# Patient Record
Sex: Female | Born: 1960 | Race: White | Hispanic: No | Marital: Married | Smoking: Current some day smoker
Health system: Southern US, Community
[De-identification: ages and names within clinical notes are randomized; demographics above are authoritative.]

## PROBLEM LIST (undated history)

## (undated) DIAGNOSIS — E785 Hyperlipidemia, unspecified: Secondary | ICD-10-CM

## (undated) DIAGNOSIS — F32A Depression, unspecified: Secondary | ICD-10-CM

## (undated) DIAGNOSIS — IMO0001 Reserved for inherently not codable concepts without codable children: Secondary | ICD-10-CM

## (undated) DIAGNOSIS — C50911 Malignant neoplasm of unspecified site of right female breast: Secondary | ICD-10-CM

## (undated) DIAGNOSIS — M1990 Unspecified osteoarthritis, unspecified site: Secondary | ICD-10-CM

## (undated) DIAGNOSIS — Z8719 Personal history of other diseases of the digestive system: Secondary | ICD-10-CM

## (undated) DIAGNOSIS — Z9889 Other specified postprocedural states: Secondary | ICD-10-CM

## (undated) DIAGNOSIS — R413 Other amnesia: Secondary | ICD-10-CM

## (undated) DIAGNOSIS — I2510 Atherosclerotic heart disease of native coronary artery without angina pectoris: Secondary | ICD-10-CM

## (undated) DIAGNOSIS — F329 Major depressive disorder, single episode, unspecified: Secondary | ICD-10-CM

## (undated) DIAGNOSIS — I10 Essential (primary) hypertension: Secondary | ICD-10-CM

## (undated) DIAGNOSIS — I341 Nonrheumatic mitral (valve) prolapse: Secondary | ICD-10-CM

## (undated) DIAGNOSIS — C50919 Malignant neoplasm of unspecified site of unspecified female breast: Secondary | ICD-10-CM

## (undated) DIAGNOSIS — F419 Anxiety disorder, unspecified: Secondary | ICD-10-CM

## (undated) DIAGNOSIS — G40909 Epilepsy, unspecified, not intractable, without status epilepticus: Secondary | ICD-10-CM

## (undated) DIAGNOSIS — I499 Cardiac arrhythmia, unspecified: Secondary | ICD-10-CM

## (undated) DIAGNOSIS — T849XXA Unspecified complication of internal orthopedic prosthetic device, implant and graft, initial encounter: Secondary | ICD-10-CM

## (undated) DIAGNOSIS — M8580 Other specified disorders of bone density and structure, unspecified site: Secondary | ICD-10-CM

## (undated) DIAGNOSIS — R112 Nausea with vomiting, unspecified: Secondary | ICD-10-CM

## (undated) DIAGNOSIS — G40209 Localization-related (focal) (partial) symptomatic epilepsy and epileptic syndromes with complex partial seizures, not intractable, without status epilepticus: Secondary | ICD-10-CM

## (undated) DIAGNOSIS — R0602 Shortness of breath: Secondary | ICD-10-CM

## (undated) HISTORY — DX: Epilepsy, unspecified, not intractable, without status epilepticus: G40.909

## (undated) HISTORY — DX: Reserved for inherently not codable concepts without codable children: IMO0001

## (undated) HISTORY — PX: TONSILLECTOMY: SUR1361

## (undated) HISTORY — DX: Major depressive disorder, single episode, unspecified: F32.9

## (undated) HISTORY — PX: BACK SURGERY: SHX140

## (undated) HISTORY — DX: Unspecified complication of internal orthopedic prosthetic device, implant and graft, initial encounter: T84.9XXA

## (undated) HISTORY — DX: Unspecified osteoarthritis, unspecified site: M19.90

## (undated) HISTORY — DX: Shortness of breath: R06.02

## (undated) HISTORY — DX: Anxiety disorder, unspecified: F41.9

## (undated) HISTORY — DX: Malignant neoplasm of unspecified site of right female breast: C50.911

## (undated) HISTORY — DX: Nonrheumatic mitral (valve) prolapse: I34.1

## (undated) HISTORY — PX: SPINE SURGERY: SHX786

## (undated) HISTORY — DX: Other amnesia: R41.3

## (undated) HISTORY — DX: Localization-related (focal) (partial) symptomatic epilepsy and epileptic syndromes with complex partial seizures, not intractable, without status epilepticus: G40.209

## (undated) HISTORY — DX: Malignant neoplasm of unspecified site of unspecified female breast: C50.919

## (undated) HISTORY — PX: CARDIAC CATHETERIZATION: SHX172

## (undated) HISTORY — DX: Depression, unspecified: F32.A

---

## 1997-08-24 ENCOUNTER — Ambulatory Visit (HOSPITAL_COMMUNITY): Admission: RE | Admit: 1997-08-24 | Discharge: 1997-08-24 | Payer: Self-pay | Admitting: Neurosurgery

## 1997-08-29 ENCOUNTER — Ambulatory Visit (HOSPITAL_COMMUNITY): Admission: RE | Admit: 1997-08-29 | Discharge: 1997-08-29 | Payer: Self-pay | Admitting: Neurosurgery

## 1997-09-15 ENCOUNTER — Ambulatory Visit (HOSPITAL_COMMUNITY): Admission: RE | Admit: 1997-09-15 | Discharge: 1997-09-15 | Payer: Self-pay | Admitting: Neurosurgery

## 1997-09-29 ENCOUNTER — Ambulatory Visit (HOSPITAL_COMMUNITY): Admission: RE | Admit: 1997-09-29 | Discharge: 1997-09-29 | Payer: Self-pay | Admitting: Neurosurgery

## 1997-10-24 ENCOUNTER — Inpatient Hospital Stay (HOSPITAL_COMMUNITY): Admission: RE | Admit: 1997-10-24 | Discharge: 1997-10-26 | Payer: Self-pay | Admitting: Neurosurgery

## 1998-04-20 ENCOUNTER — Other Ambulatory Visit: Admission: RE | Admit: 1998-04-20 | Discharge: 1998-04-20 | Payer: Self-pay | Admitting: *Deleted

## 1999-06-16 ENCOUNTER — Other Ambulatory Visit: Admission: RE | Admit: 1999-06-16 | Discharge: 1999-06-16 | Payer: Self-pay | Admitting: *Deleted

## 1999-10-13 ENCOUNTER — Emergency Department (HOSPITAL_COMMUNITY): Admission: EM | Admit: 1999-10-13 | Discharge: 1999-10-13 | Payer: Self-pay | Admitting: Emergency Medicine

## 1999-10-13 ENCOUNTER — Encounter: Payer: Self-pay | Admitting: Emergency Medicine

## 1999-10-25 ENCOUNTER — Emergency Department (HOSPITAL_COMMUNITY): Admission: EM | Admit: 1999-10-25 | Discharge: 1999-10-25 | Payer: Self-pay | Admitting: Emergency Medicine

## 1999-10-25 ENCOUNTER — Encounter: Payer: Self-pay | Admitting: Emergency Medicine

## 2000-09-28 ENCOUNTER — Other Ambulatory Visit: Admission: RE | Admit: 2000-09-28 | Discharge: 2000-09-28 | Payer: Self-pay | Admitting: *Deleted

## 2000-12-21 ENCOUNTER — Other Ambulatory Visit: Admission: RE | Admit: 2000-12-21 | Discharge: 2000-12-21 | Payer: Self-pay | Admitting: *Deleted

## 2001-02-02 ENCOUNTER — Encounter (INDEPENDENT_AMBULATORY_CARE_PROVIDER_SITE_OTHER): Payer: Self-pay | Admitting: Specialist

## 2001-02-02 ENCOUNTER — Other Ambulatory Visit: Admission: RE | Admit: 2001-02-02 | Discharge: 2001-02-02 | Payer: Self-pay | Admitting: *Deleted

## 2001-05-31 ENCOUNTER — Other Ambulatory Visit: Admission: RE | Admit: 2001-05-31 | Discharge: 2001-05-31 | Payer: Self-pay | Admitting: *Deleted

## 2002-01-15 ENCOUNTER — Encounter: Payer: Self-pay | Admitting: Internal Medicine

## 2002-01-15 ENCOUNTER — Encounter: Admission: RE | Admit: 2002-01-15 | Discharge: 2002-01-15 | Payer: Self-pay | Admitting: Internal Medicine

## 2002-02-14 ENCOUNTER — Encounter: Payer: Self-pay | Admitting: Neurosurgery

## 2002-02-15 ENCOUNTER — Encounter: Payer: Self-pay | Admitting: Neurosurgery

## 2002-02-15 ENCOUNTER — Observation Stay (HOSPITAL_COMMUNITY): Admission: RE | Admit: 2002-02-15 | Discharge: 2002-02-16 | Payer: Self-pay | Admitting: Neurosurgery

## 2002-07-02 ENCOUNTER — Other Ambulatory Visit: Admission: RE | Admit: 2002-07-02 | Discharge: 2002-07-02 | Payer: Self-pay | Admitting: Obstetrics & Gynecology

## 2002-09-05 ENCOUNTER — Ambulatory Visit (HOSPITAL_BASED_OUTPATIENT_CLINIC_OR_DEPARTMENT_OTHER): Admission: RE | Admit: 2002-09-05 | Discharge: 2002-09-05 | Payer: Self-pay | Admitting: Urology

## 2002-09-05 ENCOUNTER — Encounter (INDEPENDENT_AMBULATORY_CARE_PROVIDER_SITE_OTHER): Payer: Self-pay | Admitting: Specialist

## 2003-04-03 ENCOUNTER — Ambulatory Visit (HOSPITAL_COMMUNITY): Admission: RE | Admit: 2003-04-03 | Discharge: 2003-04-03 | Payer: Self-pay | Admitting: Neurology

## 2003-07-16 ENCOUNTER — Other Ambulatory Visit: Admission: RE | Admit: 2003-07-16 | Discharge: 2003-07-16 | Payer: Self-pay | Admitting: Obstetrics & Gynecology

## 2004-01-28 ENCOUNTER — Ambulatory Visit (HOSPITAL_COMMUNITY): Admission: RE | Admit: 2004-01-28 | Discharge: 2004-01-28 | Payer: Self-pay | Admitting: Neurosurgery

## 2004-03-24 ENCOUNTER — Inpatient Hospital Stay (HOSPITAL_COMMUNITY): Admission: RE | Admit: 2004-03-24 | Discharge: 2004-03-28 | Payer: Self-pay | Admitting: Neurosurgery

## 2008-04-18 HISTORY — PX: BREAST SURGERY: SHX581

## 2008-06-20 ENCOUNTER — Ambulatory Visit (HOSPITAL_COMMUNITY): Admission: RE | Admit: 2008-06-20 | Discharge: 2008-06-20 | Payer: Self-pay | Admitting: Neurosurgery

## 2009-01-26 ENCOUNTER — Encounter: Admission: RE | Admit: 2009-01-26 | Discharge: 2009-01-26 | Payer: Self-pay | Admitting: General Surgery

## 2009-02-13 ENCOUNTER — Ambulatory Visit (HOSPITAL_COMMUNITY): Admission: RE | Admit: 2009-02-13 | Discharge: 2009-02-14 | Payer: Self-pay | Admitting: General Surgery

## 2009-02-13 ENCOUNTER — Encounter (INDEPENDENT_AMBULATORY_CARE_PROVIDER_SITE_OTHER): Payer: Self-pay | Admitting: General Surgery

## 2009-02-13 DIAGNOSIS — C50919 Malignant neoplasm of unspecified site of unspecified female breast: Secondary | ICD-10-CM

## 2009-02-13 HISTORY — PX: MASTECTOMY, RADICAL: SHX710

## 2009-02-13 HISTORY — DX: Malignant neoplasm of unspecified site of unspecified female breast: C50.919

## 2009-02-24 ENCOUNTER — Ambulatory Visit: Payer: Self-pay | Admitting: Oncology

## 2009-03-10 ENCOUNTER — Ambulatory Visit: Payer: Self-pay | Admitting: Genetic Counselor

## 2009-03-24 LAB — COMPREHENSIVE METABOLIC PANEL
ALT: 12 U/L (ref 0–35)
AST: 12 U/L (ref 0–37)
Albumin: 4.5 g/dL (ref 3.5–5.2)
Alkaline Phosphatase: 79 U/L (ref 39–117)
BUN: 11 mg/dL (ref 6–23)
CO2: 26 mEq/L (ref 19–32)
Calcium: 9.2 mg/dL (ref 8.4–10.5)
Chloride: 104 mEq/L (ref 96–112)
Creatinine, Ser: 0.86 mg/dL (ref 0.40–1.20)
Glucose, Bld: 95 mg/dL (ref 70–99)
Potassium: 4 mEq/L (ref 3.5–5.3)
Sodium: 139 mEq/L (ref 135–145)
Total Bilirubin: 0.4 mg/dL (ref 0.3–1.2)
Total Protein: 6.7 g/dL (ref 6.0–8.3)

## 2009-03-24 LAB — CBC WITH DIFFERENTIAL/PLATELET
BASO%: 0.6 % (ref 0.0–2.0)
Basophils Absolute: 0.1 10*3/uL (ref 0.0–0.1)
EOS%: 2.5 % (ref 0.0–7.0)
Eosinophils Absolute: 0.2 10*3/uL (ref 0.0–0.5)
HCT: 46.5 % (ref 34.8–46.6)
HGB: 16.1 g/dL — ABNORMAL HIGH (ref 11.6–15.9)
LYMPH%: 43.1 % (ref 14.0–49.7)
MCH: 32.2 pg (ref 25.1–34.0)
MCHC: 34.5 g/dL (ref 31.5–36.0)
MCV: 93.3 fL (ref 79.5–101.0)
MONO#: 0.7 10*3/uL (ref 0.1–0.9)
MONO%: 7.6 % (ref 0.0–14.0)
NEUT#: 4.3 10*3/uL (ref 1.5–6.5)
NEUT%: 46.2 % (ref 38.4–76.8)
Platelets: 184 10*3/uL (ref 145–400)
RBC: 4.99 10*6/uL (ref 3.70–5.45)
RDW: 12.8 % (ref 11.2–14.5)
WBC: 9.2 10*3/uL (ref 3.9–10.3)
lymph#: 4 10*3/uL — ABNORMAL HIGH (ref 0.9–3.3)

## 2009-03-27 ENCOUNTER — Ambulatory Visit: Payer: Self-pay | Admitting: Oncology

## 2009-04-01 LAB — CBC WITH DIFFERENTIAL/PLATELET
BASO%: 0.7 % (ref 0.0–2.0)
Basophils Absolute: 0.1 10*3/uL (ref 0.0–0.1)
EOS%: 2.3 % (ref 0.0–7.0)
Eosinophils Absolute: 0.2 10*3/uL (ref 0.0–0.5)
HCT: 43.8 % (ref 34.8–46.6)
HGB: 15.6 g/dL (ref 11.6–15.9)
LYMPH%: 41.7 % (ref 14.0–49.7)
MCH: 31.9 pg (ref 25.1–34.0)
MCHC: 35.6 g/dL (ref 31.5–36.0)
MCV: 89.6 fL (ref 79.5–101.0)
MONO#: 0.8 10*3/uL (ref 0.1–0.9)
MONO%: 9.7 % (ref 0.0–14.0)
NEUT#: 3.7 10*3/uL (ref 1.5–6.5)
NEUT%: 45.6 % (ref 38.4–76.8)
Platelets: 189 10*3/uL (ref 145–400)
RBC: 4.89 10*6/uL (ref 3.70–5.45)
RDW: 12.9 % (ref 11.2–14.5)
WBC: 8.1 10*3/uL (ref 3.9–10.3)
lymph#: 3.4 10*3/uL — ABNORMAL HIGH (ref 0.9–3.3)
nRBC: 0 % (ref 0–0)

## 2009-04-16 ENCOUNTER — Ambulatory Visit: Payer: Self-pay | Admitting: Genetic Counselor

## 2009-04-22 LAB — COMPREHENSIVE METABOLIC PANEL
ALT: 17 U/L (ref 0–35)
AST: 13 U/L (ref 0–37)
Albumin: 4.3 g/dL (ref 3.5–5.2)
Alkaline Phosphatase: 86 U/L (ref 39–117)
BUN: 10 mg/dL (ref 6–23)
CO2: 21 mEq/L (ref 19–32)
Calcium: 9 mg/dL (ref 8.4–10.5)
Chloride: 106 mEq/L (ref 96–112)
Creatinine, Ser: 0.63 mg/dL (ref 0.40–1.20)
Glucose, Bld: 91 mg/dL (ref 70–99)
Potassium: 4.5 mEq/L (ref 3.5–5.3)
Sodium: 139 mEq/L (ref 135–145)
Total Bilirubin: 0.5 mg/dL (ref 0.3–1.2)
Total Protein: 6.5 g/dL (ref 6.0–8.3)

## 2009-04-22 LAB — CBC WITH DIFFERENTIAL/PLATELET
BASO%: 1 % (ref 0.0–2.0)
Basophils Absolute: 0.1 10*3/uL (ref 0.0–0.1)
EOS%: 0.2 % (ref 0.0–7.0)
Eosinophils Absolute: 0 10*3/uL (ref 0.0–0.5)
HCT: 45.2 % (ref 34.8–46.6)
HGB: 15.8 g/dL (ref 11.6–15.9)
LYMPH%: 24.9 % (ref 14.0–49.7)
MCH: 32.5 pg (ref 25.1–34.0)
MCHC: 35 g/dL (ref 31.5–36.0)
MCV: 92.9 fL (ref 79.5–101.0)
MONO#: 1.2 10*3/uL — ABNORMAL HIGH (ref 0.1–0.9)
MONO%: 15.3 % — ABNORMAL HIGH (ref 0.0–14.0)
NEUT#: 4.4 10*3/uL (ref 1.5–6.5)
NEUT%: 58.6 % (ref 38.4–76.8)
Platelets: 351 10*3/uL (ref 145–400)
RBC: 4.87 10*6/uL (ref 3.70–5.45)
RDW: 13.8 % (ref 11.2–14.5)
WBC: 7.5 10*3/uL (ref 3.9–10.3)
lymph#: 1.9 10*3/uL (ref 0.9–3.3)

## 2009-05-12 ENCOUNTER — Ambulatory Visit: Payer: Self-pay | Admitting: Genetic Counselor

## 2009-05-14 LAB — COMPREHENSIVE METABOLIC PANEL
ALT: 17 U/L (ref 0–35)
AST: 14 U/L (ref 0–37)
Albumin: 4 g/dL (ref 3.5–5.2)
Alkaline Phosphatase: 90 U/L (ref 39–117)
BUN: 11 mg/dL (ref 6–23)
CO2: 24 mEq/L (ref 19–32)
Calcium: 8.9 mg/dL (ref 8.4–10.5)
Chloride: 108 mEq/L (ref 96–112)
Creatinine, Ser: 0.7 mg/dL (ref 0.40–1.20)
Glucose, Bld: 93 mg/dL (ref 70–99)
Potassium: 4.1 mEq/L (ref 3.5–5.3)
Sodium: 142 mEq/L (ref 135–145)
Total Bilirubin: 0.3 mg/dL (ref 0.3–1.2)
Total Protein: 6.3 g/dL (ref 6.0–8.3)

## 2009-05-14 LAB — CBC WITH DIFFERENTIAL/PLATELET
BASO%: 0.9 % (ref 0.0–2.0)
Basophils Absolute: 0.1 10*3/uL (ref 0.0–0.1)
EOS%: 0.4 % (ref 0.0–7.0)
Eosinophils Absolute: 0 10*3/uL (ref 0.0–0.5)
HCT: 42.3 % (ref 34.8–46.6)
HGB: 14.7 g/dL (ref 11.6–15.9)
LYMPH%: 18.7 % (ref 14.0–49.7)
MCH: 31.8 pg (ref 25.1–34.0)
MCHC: 34.8 g/dL (ref 31.5–36.0)
MCV: 91.6 fL (ref 79.5–101.0)
MONO#: 1.1 10*3/uL — ABNORMAL HIGH (ref 0.1–0.9)
MONO%: 13.1 % (ref 0.0–14.0)
NEUT#: 5.7 10*3/uL (ref 1.5–6.5)
NEUT%: 66.9 % (ref 38.4–76.8)
Platelets: 307 10*3/uL (ref 145–400)
RBC: 4.62 10*6/uL (ref 3.70–5.45)
RDW: 15.3 % — ABNORMAL HIGH (ref 11.2–14.5)
WBC: 8.5 10*3/uL (ref 3.9–10.3)
lymph#: 1.6 10*3/uL (ref 0.9–3.3)

## 2009-06-04 LAB — CBC WITH DIFFERENTIAL/PLATELET
BASO%: 1.2 % (ref 0.0–2.0)
Basophils Absolute: 0.1 10*3/uL (ref 0.0–0.1)
EOS%: 0.3 % (ref 0.0–7.0)
Eosinophils Absolute: 0 10*3/uL (ref 0.0–0.5)
HCT: 41.9 % (ref 34.8–46.6)
HGB: 14.6 g/dL (ref 11.6–15.9)
LYMPH%: 16.2 % (ref 14.0–49.7)
MCH: 32.3 pg (ref 25.1–34.0)
MCHC: 34.8 g/dL (ref 31.5–36.0)
MCV: 92.7 fL (ref 79.5–101.0)
MONO#: 1 10*3/uL — ABNORMAL HIGH (ref 0.1–0.9)
MONO%: 14.9 % — ABNORMAL HIGH (ref 0.0–14.0)
NEUT#: 4.5 10*3/uL (ref 1.5–6.5)
NEUT%: 67.4 % (ref 38.4–76.8)
Platelets: 216 10*3/uL (ref 145–400)
RBC: 4.52 10*6/uL (ref 3.70–5.45)
RDW: 15.5 % — ABNORMAL HIGH (ref 11.2–14.5)
WBC: 6.7 10*3/uL (ref 3.9–10.3)
lymph#: 1.1 10*3/uL (ref 0.9–3.3)

## 2009-06-29 ENCOUNTER — Ambulatory Visit: Payer: Self-pay | Admitting: Genetic Counselor

## 2009-06-30 ENCOUNTER — Ambulatory Visit (HOSPITAL_COMMUNITY): Admission: RE | Admit: 2009-06-30 | Discharge: 2009-06-30 | Payer: Self-pay | Admitting: Oncology

## 2009-07-08 ENCOUNTER — Ambulatory Visit: Payer: Self-pay | Admitting: Oncology

## 2009-07-10 LAB — COMPREHENSIVE METABOLIC PANEL
ALT: 14 U/L (ref 0–35)
AST: 12 U/L (ref 0–37)
Albumin: 4.4 g/dL (ref 3.5–5.2)
Alkaline Phosphatase: 94 U/L (ref 39–117)
BUN: 9 mg/dL (ref 6–23)
CO2: 21 mEq/L (ref 19–32)
Calcium: 9.2 mg/dL (ref 8.4–10.5)
Chloride: 106 mEq/L (ref 96–112)
Creatinine, Ser: 0.79 mg/dL (ref 0.40–1.20)
Glucose, Bld: 92 mg/dL (ref 70–99)
Potassium: 4 mEq/L (ref 3.5–5.3)
Sodium: 142 mEq/L (ref 135–145)
Total Bilirubin: 0.3 mg/dL (ref 0.3–1.2)
Total Protein: 6.7 g/dL (ref 6.0–8.3)

## 2009-07-10 LAB — CBC WITH DIFFERENTIAL/PLATELET
BASO%: 0.5 % (ref 0.0–2.0)
Basophils Absolute: 0 10*3/uL (ref 0.0–0.1)
EOS%: 3.7 % (ref 0.0–7.0)
Eosinophils Absolute: 0.2 10*3/uL (ref 0.0–0.5)
HCT: 46.3 % (ref 34.8–46.6)
HGB: 16 g/dL — ABNORMAL HIGH (ref 11.6–15.9)
LYMPH%: 23.9 % (ref 14.0–49.7)
MCH: 33.2 pg (ref 25.1–34.0)
MCHC: 34.6 g/dL (ref 31.5–36.0)
MCV: 95.9 fL (ref 79.5–101.0)
MONO#: 0.6 10*3/uL (ref 0.1–0.9)
MONO%: 8.6 % (ref 0.0–14.0)
NEUT#: 4.1 10*3/uL (ref 1.5–6.5)
NEUT%: 63.3 % (ref 38.4–76.8)
Platelets: 194 10*3/uL (ref 145–400)
RBC: 4.83 10*6/uL (ref 3.70–5.45)
RDW: 12.7 % (ref 11.2–14.5)
WBC: 6.6 10*3/uL (ref 3.9–10.3)
lymph#: 1.6 10*3/uL (ref 0.9–3.3)

## 2009-07-10 LAB — FOLLICLE STIMULATING HORMONE: FSH: 112.2 m[IU]/mL

## 2009-07-10 LAB — LUTEINIZING HORMONE: LH: 51.2 m[IU]/mL

## 2009-08-06 LAB — LUTEINIZING HORMONE: LH: 49.8 m[IU]/mL

## 2009-08-06 LAB — FOLLICLE STIMULATING HORMONE: FSH: 122.4 m[IU]/mL — ABNORMAL HIGH

## 2009-09-08 ENCOUNTER — Ambulatory Visit: Payer: Self-pay | Admitting: Oncology

## 2009-09-17 LAB — CBC WITH DIFFERENTIAL/PLATELET
BASO%: 0.9 % (ref 0.0–2.0)
Basophils Absolute: 0.1 10*3/uL (ref 0.0–0.1)
EOS%: 3.4 % (ref 0.0–7.0)
Eosinophils Absolute: 0.2 10*3/uL (ref 0.0–0.5)
HCT: 46.8 % — ABNORMAL HIGH (ref 34.8–46.6)
HGB: 16.2 g/dL — ABNORMAL HIGH (ref 11.6–15.9)
LYMPH%: 32 % (ref 14.0–49.7)
MCH: 31.8 pg (ref 25.1–34.0)
MCHC: 34.7 g/dL (ref 31.5–36.0)
MCV: 91.7 fL (ref 79.5–101.0)
MONO#: 0.6 10*3/uL (ref 0.1–0.9)
MONO%: 9.7 % (ref 0.0–14.0)
NEUT#: 3.4 10*3/uL (ref 1.5–6.5)
NEUT%: 54 % (ref 38.4–76.8)
Platelets: 198 10*3/uL (ref 145–400)
RBC: 5.11 10*6/uL (ref 3.70–5.45)
RDW: 13.4 % (ref 11.2–14.5)
WBC: 6.3 10*3/uL (ref 3.9–10.3)
lymph#: 2 10*3/uL (ref 0.9–3.3)

## 2009-09-17 LAB — COMPREHENSIVE METABOLIC PANEL
ALT: 15 U/L (ref 0–35)
AST: 15 U/L (ref 0–37)
Albumin: 4.3 g/dL (ref 3.5–5.2)
Alkaline Phosphatase: 86 U/L (ref 39–117)
BUN: 11 mg/dL (ref 6–23)
CO2: 19 mEq/L (ref 19–32)
Calcium: 9.2 mg/dL (ref 8.4–10.5)
Chloride: 108 mEq/L (ref 96–112)
Creatinine, Ser: 0.82 mg/dL (ref 0.40–1.20)
Glucose, Bld: 100 mg/dL — ABNORMAL HIGH (ref 70–99)
Potassium: 4 mEq/L (ref 3.5–5.3)
Sodium: 142 mEq/L (ref 135–145)
Total Bilirubin: 0.5 mg/dL (ref 0.3–1.2)
Total Protein: 6.6 g/dL (ref 6.0–8.3)

## 2009-09-17 LAB — LUTEINIZING HORMONE: LH: 47.8 m[IU]/mL

## 2009-09-17 LAB — FOLLICLE STIMULATING HORMONE: FSH: 128.4 m[IU]/mL — ABNORMAL HIGH

## 2009-12-17 ENCOUNTER — Ambulatory Visit: Payer: Self-pay | Admitting: Oncology

## 2009-12-28 LAB — COMPREHENSIVE METABOLIC PANEL
ALT: 11 U/L (ref 0–35)
AST: 14 U/L (ref 0–37)
Albumin: 4.4 g/dL (ref 3.5–5.2)
Alkaline Phosphatase: 106 U/L (ref 39–117)
BUN: 7 mg/dL (ref 6–23)
CO2: 25 mEq/L (ref 19–32)
Calcium: 9.3 mg/dL (ref 8.4–10.5)
Chloride: 106 mEq/L (ref 96–112)
Creatinine, Ser: 0.81 mg/dL (ref 0.40–1.20)
Glucose, Bld: 103 mg/dL — ABNORMAL HIGH (ref 70–99)
Potassium: 4.4 mEq/L (ref 3.5–5.3)
Sodium: 142 mEq/L (ref 135–145)
Total Bilirubin: 0.4 mg/dL (ref 0.3–1.2)
Total Protein: 6.6 g/dL (ref 6.0–8.3)

## 2009-12-28 LAB — CBC WITH DIFFERENTIAL/PLATELET
BASO%: 1.6 % (ref 0.0–2.0)
Basophils Absolute: 0.1 10*3/uL (ref 0.0–0.1)
EOS%: 2.9 % (ref 0.0–7.0)
Eosinophils Absolute: 0.2 10*3/uL (ref 0.0–0.5)
HCT: 47.1 % — ABNORMAL HIGH (ref 34.8–46.6)
HGB: 16.4 g/dL — ABNORMAL HIGH (ref 11.6–15.9)
LYMPH%: 32.5 % (ref 14.0–49.7)
MCH: 32.3 pg (ref 25.1–34.0)
MCHC: 34.8 g/dL (ref 31.5–36.0)
MCV: 92.8 fL (ref 79.5–101.0)
MONO#: 0.6 10*3/uL (ref 0.1–0.9)
MONO%: 9.7 % (ref 0.0–14.0)
NEUT#: 3.3 10*3/uL (ref 1.5–6.5)
NEUT%: 53.3 % (ref 38.4–76.8)
Platelets: 170 10*3/uL (ref 145–400)
RBC: 5.07 10*6/uL (ref 3.70–5.45)
RDW: 12.8 % (ref 11.2–14.5)
WBC: 6.2 10*3/uL (ref 3.9–10.3)
lymph#: 2 10*3/uL (ref 0.9–3.3)

## 2009-12-28 LAB — LUTEINIZING HORMONE: LH: 56.5 m[IU]/mL

## 2009-12-28 LAB — FOLLICLE STIMULATING HORMONE: FSH: 150.5 m[IU]/mL — ABNORMAL HIGH

## 2010-01-06 LAB — ESTRADIOL, ULTRA SENS: Estradiol, Ultra Sensitive: 7 pg/mL

## 2010-03-29 ENCOUNTER — Ambulatory Visit: Payer: Self-pay | Admitting: Oncology

## 2010-04-18 HISTORY — PX: HAND TENDON SURGERY: SHX663

## 2010-06-01 DIAGNOSIS — I341 Nonrheumatic mitral (valve) prolapse: Secondary | ICD-10-CM

## 2010-06-01 HISTORY — DX: Nonrheumatic mitral (valve) prolapse: I34.1

## 2010-06-29 ENCOUNTER — Other Ambulatory Visit: Payer: Self-pay | Admitting: Rheumatology

## 2010-06-29 DIAGNOSIS — M79644 Pain in right finger(s): Secondary | ICD-10-CM

## 2010-07-01 ENCOUNTER — Ambulatory Visit
Admission: RE | Admit: 2010-07-01 | Discharge: 2010-07-01 | Disposition: A | Discharge status: Home / Self Care | Payer: Medicare Other | Source: Ambulatory Visit | Attending: Rheumatology | Admitting: Rheumatology

## 2010-07-01 DIAGNOSIS — M79644 Pain in right finger(s): Secondary | ICD-10-CM

## 2010-07-01 MED ORDER — GADOBENATE DIMEGLUMINE 529 MG/ML IV SOLN
12.0000 mL | Freq: Once | INTRAVENOUS | Status: AC | PRN
  Administered 2010-07-01: 12 mL via INTRAVENOUS

## 2010-07-22 LAB — DIFFERENTIAL
Basophils Absolute: 0.1 10*3/uL (ref 0.0–0.1)
Basophils Relative: 1 % (ref 0–1)
Eosinophils Absolute: 0.2 10*3/uL (ref 0.0–0.7)
Eosinophils Relative: 2 % (ref 0–5)
Lymphocytes Relative: 45 % (ref 12–46)
Lymphs Abs: 4 10*3/uL (ref 0.7–4.0)
Monocytes Absolute: 0.8 10*3/uL (ref 0.1–1.0)
Monocytes Relative: 9 % (ref 3–12)
Neutro Abs: 3.9 10*3/uL (ref 1.7–7.7)
Neutrophils Relative %: 44 % (ref 43–77)

## 2010-07-22 LAB — COMPREHENSIVE METABOLIC PANEL
ALT: 20 U/L (ref 0–35)
AST: 19 U/L (ref 0–37)
Albumin: 4.2 g/dL (ref 3.5–5.2)
Alkaline Phosphatase: 82 U/L (ref 39–117)
BUN: 6 mg/dL (ref 6–23)
CO2: 26 mEq/L (ref 19–32)
Calcium: 8.9 mg/dL (ref 8.4–10.5)
Chloride: 106 mEq/L (ref 96–112)
Creatinine, Ser: 0.81 mg/dL (ref 0.4–1.2)
GFR calc Af Amer: >60 mL/min (ref >60)
GFR calc non Af Amer: >60 mL/min (ref >60)
Glucose, Bld: 88 mg/dL (ref 70–99)
Potassium: 3.7 mEq/L (ref 3.5–5.1)
Sodium: 140 mEq/L (ref 135–145)
Total Bilirubin: 0.4 mg/dL (ref 0.3–1.2)
Total Protein: 7.1 g/dL (ref 6.0–8.3)

## 2010-07-22 LAB — URINALYSIS, ROUTINE W REFLEX MICROSCOPIC
Bilirubin Urine: NEGATIVE
Glucose, UA: NEGATIVE mg/dL
Hgb urine dipstick: NEGATIVE
Ketones, ur: NEGATIVE mg/dL
Nitrite: NEGATIVE
Protein, ur: NEGATIVE mg/dL
Specific Gravity, Urine: 1.01 (ref 1.005–1.030)
Urobilinogen, UA: 0.2 mg/dL (ref 0.0–1.0)
pH: 6 (ref 5.0–8.0)

## 2010-07-22 LAB — CBC
HCT: 49.2 % — ABNORMAL HIGH (ref 36.0–46.0)
Hemoglobin: 17 g/dL — ABNORMAL HIGH (ref 12.0–15.0)
MCHC: 34.6 g/dL (ref 30.0–36.0)
MCV: 94.4 fL (ref 78.0–100.0)
Platelets: 199 10*3/uL (ref 150–400)
RBC: 5.21 MIL/uL — ABNORMAL HIGH (ref 3.87–5.11)
RDW: 13.2 % (ref 11.5–15.5)
WBC: 9 10*3/uL (ref 4.0–10.5)

## 2010-07-22 LAB — LACTATE DEHYDROGENASE: LDH: 124 U/L (ref 94–250)

## 2010-07-22 LAB — CANCER ANTIGEN 27.29: CA 27.29: 37 U/mL (ref 0–39)

## 2010-09-03 NOTE — Op Note (Signed)
NAMEAYRIANNA, MCGINNISS             ACCOUNT NO.:  [id number]   MEDICAL RECORD NO.:  [id number]          PATIENT TYPE:  INP   LOCATION:  3113                         FACILITY:  [facility]   PHYSICIAN:  Payton Doughty, M.D.      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   PROCEDURE:  L2-3, L3-4 laminectomy and discectomy.  Posterior lumbar  interbody fusion with Ray type fusion cages.  Posterolateral arthrodesis.   SURGEON:  Payton Doughty, M.D.   ANESTHESIA:  General endotracheal anesthesia.   PREPARATION:  __________ prepped and scrubbed with alcohol wipe.   COMPLICATIONS:  None.   NURSING ASSISTANT:  [place].   DOCTOR ASSISTANT:  Stefani Dama, M.D.   DOCTOR ASSISTANT:  A 50 year old right-handed white female with severe  lumbar spondylosis taken to the operating room __________ intubated, placed  prone on the operating table.  Following shave, prep and drape in he usual  sterile fashion, skin was infiltrated with 1% lidocaine with 1:400,000  epinephrine and the skin was incised from the middle of L1 to the bottom of  L3 and the lamina and transverse processes of L2, L3 and L4 were exposed  bilaterally in subperiosteal plane.  Intraoperative x-ray confirmed  correctness level of the pars, lenticularis, lamina and inferior facet of L2  and L3 and the superior facet of L3 and L4 were removed bilaterally.  On the  left side, at 2-3, facet was actually disrupted and fractured with some  penetration of the dura. The fragments were removed and the dura repaired  without difficulty.  Brisk bleeding was encountered at all levels from the  epidural space.  After using cautery and thrombin soaked Gelfoam for  hemostasis, the nerve roots were dissected free and the disc removed at each  level.  A 12 x 20  mm Ray type fusion cages were then placed bilaterally at  each level.  Intraoperative x-ray showed good placement of cages.  They were  packed with bone graft harvested from the facet joints.  DBX and morselized  allograft were then mixed over the transverse processes which had been  decorticated as posterolateral arthrodesis.  The entire repair was irrigated  and hemostasis assured.  The fascia was reapproximated  with 0 Vicryl in interrupted fashion.  Subcutaneous tissue was  reapproximated with 0 Vicryl in interrupted fashion.  Skin was closed with 3-  0 nylon in running locked fashion.  Betadine and Telfa dressing was applied  and made occlusive with Op-Site and patient returned to the recovery room in  good condition.       MWR/MEDQ  D:  03/24/2004  T:  03/25/2004  Job:  [id number]

## 2010-09-03 NOTE — Consult Note (Signed)
[name], [name]             ACCOUNT NO.:  [id number]   MEDICAL RECORD NO.:  [id number]          PATIENT TYPE:  INP   LOCATION:  3113                         FACILITY:  [facility]   PHYSICIAN:  Ulyses Amor, MD DATE OF BIRTH:  01/02/1961   DATE OF CONSULTATION:  03/24/2004  DATE OF DISCHARGE:                                   CONSULTATION   CONSULTING PHYSICIAN:  Ulyses Amor, M.D.   Wendy Lopez is a 50 year old white woman who underwent back surgery.  Postoperatively, she complained of vague chest pain.  On further  questioning, she describes it as a fullness in her anterior chest.  It did  not radiate.  There were no other associated symptoms.  It appeared not to  be related to position __________  respirations.  An electrocardiogram was  obtained which demonstrated anterior T wave inversion, hence, cardiology  consultation was requested urgently.  She is hospitalized in the neuro  intensive care unit.  Her surgery and her postoperative course have  otherwise been unremarkable.   The patient has no history of cardiac disease, including no history of chest  pain, myocardial infarction, coronary artery disease, congestive heart  failure, or arrhythmia.  She has complained of palpitations in the past  which have been treated with a beta-blocker but no specific arrhythmia or  conduction disturbance has been documented.  In December 2004, she underwent  a stress Cardiolite.  This demonstrated no evidence of ischemia.  Her  ejection fraction was 83%.  The patient has no history of hypertension,  hyperlipidemia, or diabetes mellitus.  She smokes one pack of cigarettes per  day.  There is also a family history of coronary artery disease (father).   ALLERGIES:  The patient is reportedly allergic to a host of medications.  These include:  1.  IODINE.  2.  PREDNISONE.  3.  CARBAMAZEPINE.  4.  TYLENOL.  5.  VICODIN.  6.  IBUPROFEN.   PAST MEDICAL HISTORY:  Seizure  disorder.   CURRENT MEDICATIONS:  Dilantin, Lamictal, Toprol XL, Depo-Provera, Tramadol,  Skelaxin, and Actonel.   The patient lives with her husband.   PHYSICAL EXAMINATION:  VITAL SIGNS:  Blood pressure 119/42, pulse 75 and  regular, respirations 12.  GENERAL:  The patient was a middle-aged white woman who was somewhat  lethargic post anesthesia.  She responded to questions appropriately.  HEENT:  Normal.  NECK:  Without thyromegaly or adenopathy.  Carotid pulses were palpable  bilaterally and without bruits.  CARDIAC:  Revealed a normal S1 and S2.  There was no S3, S4, murmur, rub, or  click.  Cardiac rhythm was regular.  No chest wall tenderness was noted.  LUNGS:  Clear.  ABDOMEN:  Soft and nontender.  There was no mass, hepatosplenomegaly, bruit,  distention, rebound, guarding, or rigidity.  EXTREMITIES:  Without edema, deviation, or deformity.  Radial and dorsalis  pedal pulses were palpable bilaterally.   The electrocardiogram, dated December __________ at 12:16, revealed a normal  sinus rhythm with T wave inversion in the inferior leads and T wave  flattening in the anterolateral leads.  The second tracing, dated March 24, 2004 at 1328, revealed the aforementioned findings plus T wave inversion  in lead III.  The third tracing, date March 24, 2004 at 2011, revealed  more marked anterior T wave inversion this time involving the V1-V4.   White count (preadmission) was 9.1 with a hemoglobin of 16.2, and hematocrit  of 46.0.  Potassium was 4.4 with a BUN of 6, and creatinine of 0.9.   IMPRESSION:  1.  Vague chest discomfort - postoperatively, anterior T wave inversions,      rule out cardiac ischemia.  There was no prior history of coronary      artery disease and a stress Cardiolite last December was negative for      ischemia.  2.  History of palpitations.   RECOMMENDATIONS:  1.  Serial cardiac enzymes.  2.  Repeat electrocardiogram.  3.  Dr. Tresa Endo will follow  with you.  4.  Discontinue smoking.   The patient's husband was present in the room throughout the patient  encounter.      Mitc   MSC/MEDQ  D:  03/24/2004  T:  03/24/2004  Job:  [id number]   cc:   Ulyses Amor, MD  [address]  [place], [place] [address]  Fax: [phone]   Payton Doughty, M.D.  [address]  [place] [address]  Fax: [phone]   Nicki Guadalajara, M.D.  [address], [address]  [place], [place] [address]  Fax: [phone]

## 2010-09-03 NOTE — H&P (Signed)
Wendy Lopez, Wendy Lopez             ACCOUNT NO.:  [id number]   MEDICAL RECORD NO.:  [id number]          PATIENT TYPE:  INP   LOCATION:  2861                         FACILITY:  [facility]   PHYSICIAN:  Payton Doughty, M.D.      DATE OF BIRTH:  05/16/1960   DATE OF ADMISSION:  03/24/2004  DATE OF DISCHARGE:                                HISTORY & PHYSICAL   ADMITTING DIAGNOSIS:  Spondylosis at L2-3 and L3-4.   SERVICE:  Neurosurgery.   BODY OF TEXT:  A very nice now 50 year old right-handed white girl who has  had 4-5 and 5-1 fusion in 1999 and had done reasonably well, and in 2003,  had a 2-3 disk on the right side.  She had done fairly well over the past  couple of years, but had marked increase the pain in her back and down both  legs.  MRI demonstrates severe spondylosis at 3-4 and at 2-3, a solid fusion  at 4-5 and 5-1, and she is now admitted for fusion at 2-3 and 3-4.   MEDICAL HISTORY:  Medical history is remarkable for:  1.  C-section in '82.  2.  Tonsillectomy in '69.  3.  She has seizures and on Dilantin.   ALLERGIES:  She is allergic to IODINE, TEGRETOL and LATEX.   SOCIAL HISTORY:  She smokes a half a pack of cigarettes a day and does not  drink alcohol, and is not working now.   REVIEW OF SYSTEMS:  Review of systems is noncontributory.   FAMILY HISTORY:  Family history is noncontributory.   PHYSICAL EXAMINATION:  HEENT:  Her HEENT exam is within normal limits.  NECK:  She has good range of motion of her neck.  CHEST:  Chest is clear.  CARDIAC:  Regular rate and rhythm with a mid-systolic click.  ABDOMEN:  Abdomen is nontender with no hepatosplenomegaly.  EXTREMITIES:  Extremities are without clubbing or cyanosis.  GU:  Exam is deferred.  PERIPHERAL VASCULAR:  Peripheral pulses are good.  NEUROLOGIC:  Neurologically, she is awake, alert and oriented.  Her cranial  nerves are intact.  Motor exam shows 5/5 strength throughout the upper and  lower extremities.  She has  positive straight leg raise bilaterally and  complains of neurogenic claudication.  Reflexes are a flicker at the knees  and absent at the ankles bilaterally.   CLINICAL IMPRESSION:  Severe lumbar spondylosis.   PLAN:  The plan is for a 2-3, 3-4 interbody fusion with intertransverse  bone, and posterolateral arthrodesis.  The risks and benefits of this  approach have been discussed with her, and she wishes to proceed.       MWR/MEDQ  D:  03/24/2004  T:  03/24/2004  Job:  [id number]

## 2010-09-03 NOTE — Discharge Summary (Signed)
NAMEANTONINA, DEZIEL             ACCOUNT NO.:  [id number]   MEDICAL RECORD NO.:  [id number]          PATIENT TYPE:  INP   LOCATION:  3003                         FACILITY:  [facility]   PHYSICIAN:  Danae Orleans. Venetia Maxon, M.D.  DATE OF BIRTH:  1960/12/18   DATE OF ADMISSION:  03/24/2004  DATE OF DISCHARGE:  03/28/2004                                 DISCHARGE SUMMARY   REASON FOR ADMISSION:  1.  Lumbar spondylosis.  2.  History of seizure disorder.  3.  Status post arthrodesis.  4.  Ischemic heart disease.  5.  Palpitation.  6.  Chest pain not otherwise specified.  7.  Tobacco use disorder.  8.  Esophageal reflux.   HISTORY OF PRESENT ILLNESS AND HOSPITAL COURSE:  Ms. Eddie is a patient of  Dr. Loraine Leriche Roy's.  She had spondylosis at L2-3 and L3-4 levels and had  previously undergone an L4-5 and L5-S1 fusion in 1999 and did well but in  2003 had a herniated lumbar disc at L2-3 for which she had surgery.  She  then had evidence on a recent MRI of spondylosis at L2-3 and L3-4 level with  solid arthrodesis at L4 through sacral levels.  The patient continues to  smoke despite advise not to.  She was admitted to the hospital and underwent  L2-3 and L3-4 laminectomy with Ray cage placement and posterolateral  arthrodesis.  She was seen by cardiology for vague chest discomfort and had  serial cardiac enzymes which were okay.  She was observed and gradually  mobilized.  She was doing well on March 27, 2004, and PCA and Foley were  discontinued. The patient was discharged home on March 28, 2004.   DISCHARGE DIAGNOSES:  1.  Lumbar spondylosis.  2.  History of seizure disorder.  3.  Status post arthrodesis.  4.  Ischemic heart disease.  5.  Palpitation.  6.  Chest pain not otherwise specified.  7.  Tobacco use disorder.  8.  Esophageal reflux.   DISCHARGE MEDICATION:  Vicodin.   DISCHARGE INSTRUCTIONS:  Follow-up in one week for suture removal.   CONDITION ON DISCHARGE:   Improved.      JDS/MEDQ  D:  06/11/2004  T:  06/11/2004  Job:  [id number]

## 2010-09-08 ENCOUNTER — Encounter (HOSPITAL_BASED_OUTPATIENT_CLINIC_OR_DEPARTMENT_OTHER)
Admission: RE | Admit: 2010-09-08 | Discharge: 2010-09-08 | Disposition: A | Discharge status: Home / Self Care | Payer: Medicare Other | Source: Ambulatory Visit | Attending: Orthopedic Surgery | Admitting: Orthopedic Surgery

## 2010-09-08 LAB — BASIC METABOLIC PANEL
BUN: 10 mg/dL (ref 6–23)
CO2: 26 mEq/L (ref 19–32)
Calcium: 9.8 mg/dL (ref 8.4–10.5)
Chloride: 105 mEq/L (ref 96–112)
Creatinine, Ser: 0.68 mg/dL (ref 0.4–1.2)
GFR calc Af Amer: >60 mL/min (ref >60)
GFR calc non Af Amer: >60 mL/min (ref >60)
Glucose, Bld: 87 mg/dL (ref 70–99)
Potassium: 4.3 mEq/L (ref 3.5–5.1)
Sodium: 139 mEq/L (ref 135–145)

## 2010-09-09 ENCOUNTER — Ambulatory Visit (HOSPITAL_BASED_OUTPATIENT_CLINIC_OR_DEPARTMENT_OTHER)
Admission: RE | Admit: 2010-09-09 | Discharge: 2010-09-09 | Disposition: A | Discharge status: Home / Self Care | Payer: Medicare Other | Source: Ambulatory Visit | Attending: Orthopedic Surgery | Admitting: Orthopedic Surgery

## 2010-09-09 DIAGNOSIS — M6530 Trigger finger, unspecified finger: Secondary | ICD-10-CM | POA: Insufficient documentation

## 2010-09-09 DIAGNOSIS — Z01812 Encounter for preprocedural laboratory examination: Secondary | ICD-10-CM | POA: Insufficient documentation

## 2010-09-09 DIAGNOSIS — Z853 Personal history of malignant neoplasm of breast: Secondary | ICD-10-CM | POA: Insufficient documentation

## 2010-09-09 DIAGNOSIS — I10 Essential (primary) hypertension: Secondary | ICD-10-CM | POA: Insufficient documentation

## 2010-09-09 DIAGNOSIS — F17200 Nicotine dependence, unspecified, uncomplicated: Secondary | ICD-10-CM | POA: Insufficient documentation

## 2010-09-09 DIAGNOSIS — R569 Unspecified convulsions: Secondary | ICD-10-CM | POA: Insufficient documentation

## 2010-09-10 LAB — POCT HEMOGLOBIN-HEMACUE: Hemoglobin: 16.7 g/dL — ABNORMAL HIGH (ref 12.0–15.0)

## 2010-09-14 NOTE — Op Note (Signed)
NAMEBERLINDA, Wendy Lopez             ACCOUNT NO.:  [id number]  MEDICAL RECORD NO.:  [id number]          PATIENT TYPE:  LOCATION:                                 FACILITY:  PHYSICIAN:  Betha Loa, MD             DATE OF BIRTH:  DATE OF PROCEDURE:  09/09/2010 DATE OF DISCHARGE:                              OPERATIVE REPORT   PREOPERATIVE DIAGNOSIS:  Right long and ring finger trigger digits.  POSTOPERATIVE DIAGNOSIS:  Right long and ring finger trigger digits.  PROCEDURE:  Right long and ring finger trigger release.  SURGEON:  Betha Loa, MD  ASSISTANT:  Marveen Reeks Dasnoit, PA  ANESTHESIA:  MAC with local.  IV FLUIDS:  Per anesthesia flow sheet.  ESTIMATED BLOOD LOSS:  Minimal.  COMPLICATIONS:  None.  SPECIMENS:  None.  TOURNIQUET TIME:  22 minutes.  DISPOSITION:  Stable to PACU.  INDICATIONS:  Wendy Lopez is a 50 year old white female with history of breast cancer and treatment by Arimidex.  She has noticed trigger digits in multiple fingers of bilateral hands.  She was seen in the office multiple times for treatment of these.  The right long and ring finger have been injected twice each and the triggering continues to recur. She wishes to have release of the trigger digits for treatment of the problem.  Risks, benefits, and alternatives of the surgery were discussed including risk of blood loss, infection, damage to nerves, vessels, tendons, ligaments, bone, failure of procedure, need for additional procedures, complications with wound healing, continued pain, continued triggering, potential need for repeat surgery.  She voiced understanding of these risks and elected to proceed.  OPERATIVE COURSE:  After being identified preoperatively by myself, the patient and I agreed upon procedure and site of the procedure.  Surgical site was marked.  Risks, benefits, and alternatives of surgery were reviewed and she wished to proceed.  She was given 1 g of IV Ancef  as preoperative antibiotic prophylaxis.  She was transferred to the operating room and placed on the operating room table in supine position with her right upper extremity on the arm board.  General anesthesia was induced by the anesthesiologist.  The right upper extremity was prepped and draped in normal sterile orthopedic fashion.  Surgical pause was performed between surgeons, Anesthesia, and operating room staff and all were in agreement as to the patient procedure and site of the procedure. Tourniquet on proximal aspect of the extremities inflated to 250 mmHg after exsanguination of the limb with an Esmarch bandage.  The long finger was addressed first.  An oblique incision was made between the proximal and distal palmar creases.  This was carried into the subcutaneous tissues.  Spreading technique was used.  Care was taken to protect all neurovascular structures throughout the case.  The A1 pulley was identified and incised sharply.  The very proximal aspect approximately 1 mm of the A2 pulley was incised to create a funnel for the tendons.  There was noted to be a nodule on the tendon that was able to slide proximally after release of the A1 pulley.  Attention  was turned to the ring finger.  Again, an oblique incision was made at the level of the distal palmar crease.  This was carried into the subcutaneous tissues.  Spreading technique again was used.  Care was taken to protect all neurovascular structures throughout the case.  The tendons were placed under traction, the finger was not able to be pulled into full flexion.  The A1 pulley was incised sharply and the very proximal aspect of the A2 pulley was incised at approximately 1.5 to 1 mm to again create a funnel for the tendons.  With traction on the tendons, the finger was then able to be placed into full flexion.  In both fingers, there was noted to be significant synovial adherence between the tendons.  This was released as  best as possible.  The FDS of the ring finger tendon had some mild degeneration.  The patient was brought up from her sedation.  She was asked to place the fingers down into the palm in a tight fist.  She was able to do so.  She was then asked to slowly extend the fingers to full extension which she was able to be without any sensation of triggering.  There was no visible triggering.  The fingers were placed through active and passive range of motion multiple times, and there was no triggering.  She had full flexion, full extension of the digits.  The wounds were copiously irrigated with 300 mL of sterile saline.  Skin was then closed with 4-0 nylon in a horizontal mattress fashion.  The wounds were dressed with sterile Xeroform, 4x4s, and wrapped with Kling and a Coban dressing lightly.  Tourniquet was deflated at 22 minutes.  The fingertips were pink with brisk capillary refill after deflation of the tourniquet.  The operative drapes were broken down and the patient was awoken from anesthesia safely.  She was transferred back to stretcher and taken to PACU in stable condition.  I will see her back in the office in 1 week for postoperative followup.  I will give her Percocet 5/325 one to two p.o. q.6 h. p.r.n. pain dispensed #40.  Prior to start of the procedure, under Betadine prep, the area of surgery was injected with 0.25% plain Marcaine, approximately 5 mL was used.     Betha Loa, MD     KK/MEDQ  D:  09/09/2010  T:  09/10/2010  Job:  [id number]  Electronically Signed by Betha Loa  on 09/14/2010 04:25:30 PM

## 2010-09-16 ENCOUNTER — Encounter (INDEPENDENT_AMBULATORY_CARE_PROVIDER_SITE_OTHER): Payer: Self-pay | Admitting: General Surgery

## 2011-02-22 ENCOUNTER — Ambulatory Visit (INDEPENDENT_AMBULATORY_CARE_PROVIDER_SITE_OTHER): Payer: Self-pay | Admitting: General Surgery

## 2011-03-21 ENCOUNTER — Ambulatory Visit (INDEPENDENT_AMBULATORY_CARE_PROVIDER_SITE_OTHER): Payer: Medicare Other | Admitting: General Surgery

## 2011-03-21 ENCOUNTER — Encounter (INDEPENDENT_AMBULATORY_CARE_PROVIDER_SITE_OTHER): Payer: Self-pay | Admitting: General Surgery

## 2011-03-21 VITALS — BP 122/76 | HR 60 | Temp 97.8°F | Resp 16 | Ht 62.5 in | Wt 141.1 lb

## 2011-03-21 DIAGNOSIS — Z853 Personal history of malignant neoplasm of breast: Secondary | ICD-10-CM

## 2011-03-21 NOTE — Patient Instructions (Signed)
Your physical exam today is normal. There is no evidence of breast cancer on either side. Your mammograms of the left breast performed at [facility] on November 14 looked normal. There are no abnormalities.  At your request, I will refer you to Dr. Glenford Peers for long-term followup regarding medical oncology needs.  Return to see me in one year after he gets her annual mammograms.

## 2011-03-21 NOTE — Progress Notes (Signed)
Patient ID: Wendy Lopez, female   DOB: 02-09-1961, 50 y.o.   MRN: [id number]  Chief Complaint  Patient presents with  . Follow-up    breast recheck     HPI Wendy Lopez is a 50 y.o. female.  She is seen in long-term followup for her right breast cancer.  This patient underwent right total mastectomy and sentinel node biopsy on February 13, 2009. 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  She was initially followed by Dr. Jethro Bolus, and was on Arimadex. She ultimately developed trigger fingers and ultimately had to have surgery with Dr. Merlyn Lot. It was felt that this was a side effect of the arimadex. She is no longer following with Dr. Gaylyn Rong..  Left mammogram was performed on March 02, 2011 [facility] and it looks normal, category one, no focal abnormalities.  She has no complaints about her mastectomy site or her left breast. HPI  Past Medical History  Diagnosis Date  . Epilepsy   . Spinal fusion failure     3  . Mitral valve prolapse   . Osteoporosis   . Cancer   . Arthritis     Past Surgical History  Procedure Date  . Tonsillectomy   . Breast surgery 2010    mastectomy   . Spine surgery 1999, 2003, 2005  . Hand tendon surgery 2012    tendons released in right hand     Family History  Problem Relation Age of Onset  . Arthritis Mother     rhuematoid  . Heart disease Mother     Atrial fib  . Heart disease Father   . Heart disease Sister     Atrial fib    Social History History  Substance Use Topics  . Smoking status: Current Everyday Smoker -- 0.5 packs/day  . Smokeless tobacco: Never Used   Comment: 1/2 pk per day  . Alcohol Use: No    Allergies  Allergen Reactions  . Carbamazepine   . Latex   . Oxycodone   . Prednisone   . Valium     Current Outpatient Prescriptions  Medication Sig Dispense Refill  . Cetirizine HCl (ZYRTEC PO) Take by mouth as needed.       Marland Kitchen  HYDROcodone-acetaminophen (VICODIN) 5-500 MG per tablet Take 1 tablet by mouth every 6 (six) hours as needed.        . LamoTRIgine (LAMICTAL PO) Take 200 mg by mouth 2 (two) times daily.        . Metoprolol Succinate (TOPROL XL PO) Take 150 mg by mouth daily.        . phenytoin (DILANTIN) 100 MG ER capsule Take by mouth 4 (four) times daily.          Review of Systems Review of Systems 12 system review of systems is performed and is negative except as described above. Blood pressure 122/76, pulse 60, temperature 97.8 F (36.6 C), temperature source Temporal, resp. rate 16, height 5' 2.5" (1.588 m), weight 141 lb 2 oz (64.014 kg).  Physical Exam Physical Exam  Constitutional: She appears well-developed and well-nourished. No distress.  HENT:  Head: Normocephalic.  Eyes: Conjunctivae are normal. Pupils are equal, round, and reactive to light. No scleral icterus.  Neck: Normal range of motion. Neck supple. No JVD present. No tracheal deviation present. No thyromegaly present.  Cardiovascular: Normal rate and regular rhythm.   Pulmonary/Chest: Effort normal and breath sounds normal. No respiratory distress.  She has no wheezes. She has no rales. She exhibits no tenderness.    Lymphadenopathy:    She has no cervical adenopathy.  Skin: She is not diaphoretic.    Data Reviewed I have reviewed her old records and her recent mammograms  Assessment    Invasive ductal carcinoma right breast, pathologic stage TI C., N0, receptor positive, Ki-67 50%, HER-2 negative.  No evidence of recurrence 2 years following right mastectomy and sentinel node biopsy.    Plan    She will return to see me in one year after she gets her annual mammograms.  She has requested that I refer her to Dr. Glenford Peers in [place] and so we will do that.       Donyae Kilner M 03/21/2011, 4:15 PM

## 2011-04-01 ENCOUNTER — Ambulatory Visit (HOSPITAL_COMMUNITY): Payer: Medicare Other | Admitting: Oncology

## 2011-04-22 ENCOUNTER — Encounter (HOSPITAL_COMMUNITY): Payer: Medicare Other | Attending: Oncology | Admitting: Oncology

## 2011-04-22 ENCOUNTER — Encounter (HOSPITAL_COMMUNITY): Payer: Self-pay | Admitting: Oncology

## 2011-04-22 VITALS — BP 121/75 | HR 76 | Temp 98.4°F | Ht 62.5 in | Wt 139.0 lb

## 2011-04-22 DIAGNOSIS — C50919 Malignant neoplasm of unspecified site of unspecified female breast: Secondary | ICD-10-CM | POA: Diagnosis not present

## 2011-04-22 LAB — COMPREHENSIVE METABOLIC PANEL
ALT: 15 U/L (ref 0–35)
AST: 16 U/L (ref 0–37)
Albumin: 4.3 g/dL (ref 3.5–5.2)
Alkaline Phosphatase: 148 U/L — ABNORMAL HIGH (ref 39–117)
BUN: 10 mg/dL (ref 6–23)
CO2: 30 mEq/L (ref 19–32)
Calcium: 10.1 mg/dL (ref 8.4–10.5)
Chloride: 102 mEq/L (ref 96–112)
Creatinine, Ser: 0.8 mg/dL (ref 0.50–1.10)
GFR calc Af Amer: >90 mL/min (ref >90)
GFR calc non Af Amer: 84 mL/min — ABNORMAL LOW (ref >90)
Glucose, Bld: 83 mg/dL (ref 70–99)
Potassium: 4.5 mEq/L (ref 3.5–5.1)
Sodium: 139 mEq/L (ref 135–145)
Total Bilirubin: 0.4 mg/dL (ref 0.3–1.2)
Total Protein: 7.3 g/dL (ref 6.0–8.3)

## 2011-04-22 LAB — DIFFERENTIAL
Basophils Absolute: 0.1 10*3/uL (ref 0.0–0.1)
Basophils Relative: 1 % (ref 0–1)
Eosinophils Absolute: 0.3 10*3/uL (ref 0.0–0.7)
Eosinophils Relative: 3 % (ref 0–5)
Lymphocytes Relative: 42 % (ref 12–46)
Lymphs Abs: 3.8 10*3/uL (ref 0.7–4.0)
Monocytes Absolute: 0.7 10*3/uL (ref 0.1–1.0)
Monocytes Relative: 8 % (ref 3–12)
Neutro Abs: 4.2 10*3/uL (ref 1.7–7.7)
Neutrophils Relative %: 46 % (ref 43–77)

## 2011-04-22 LAB — CBC
HCT: 47.9 % — ABNORMAL HIGH (ref 36.0–46.0)
Hemoglobin: 16.4 g/dL — ABNORMAL HIGH (ref 12.0–15.0)
MCH: 30.8 pg (ref 26.0–34.0)
MCHC: 34.2 g/dL (ref 30.0–36.0)
MCV: 90 fL (ref 78.0–100.0)
Platelets: 189 10*3/uL (ref 150–400)
RBC: 5.32 MIL/uL — ABNORMAL HIGH (ref 3.87–5.11)
RDW: 13.1 % (ref 11.5–15.5)
WBC: 9 10*3/uL (ref 4.0–10.5)

## 2011-04-22 NOTE — Progress Notes (Signed)
This office note has been dictated.

## 2011-04-22 NOTE — Patient Instructions (Signed)
[facility]  Discharge Instructions Wendy Lopez  [id number] [birth year]  RECOMMENDATIONS MADE BY THE CONSULTANT AND ANY TEST RESULTS WILL BE SENT TO YOUR REFERRING DOCTOR.   EXAM FINDINGS BY MD TODAY AND SIGNS AND SYMPTOMS TO REPORT TO CLINIC OR PRIMARY MD: Exam findings as discussed by Dr. Mariel Sleet.  Return in 3 months to see Neijstrom - we will contact you if your labs were abnormal.  I acknowledge that I have been informed and understand all the instructions given to me and received a copy. I do not have any more questions at this time, but understand that I may call the Specialty Clinic at [facility] at [phone] during business hours should I have any further questions or need assistance in obtaining follow-up care.    __________________________________________  _____________  __________ Signature of Patient or Authorized Representative            Date                   Time    __________________________________________ Nurse's Signature

## 2011-04-22 NOTE — Progress Notes (Signed)
CC:   Georgann Housekeeper, MD Angelia Mould. Derrell Lolling, M.D. Genene Churn. Love, M.D.  DIAGNOSES: 1. Stage I (T1c N0) receptor positive(ER 99%, PR 54%, Ki-67 15% and Her-2           negative) breast cancer with Ki-67 marker     low at 15%, HER-2/neu was negative, status post surgery on     02/13/2009 at which time she underwent a right total mastectomy and     sentinel node biopsy.  The sentinel node was negative.  2. Arimidex-induced trigger fingers requiring a release by Dr. Merlyn Lot     in 2011 due to severe symptomatology. 3. 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 Melbourne Abts.  She is on     Lamictal and Dilantin.  There may be a history of petty mal     seizures in retrospect, she states. 4. History of degenerative disk and joint disease, status post 3     fusion procedures of the lumbar spine by Dr. Trey Sailors in 1999,     2003, and 2005, respectively. 5. History of mitral valve prolapse, though I do not hear a murmur     today. 6. History of tonsillectomy years ago. 7. History of smoking.  She is now smoking one-half pack a day.  She     smoked intermittently for many years, quitting on several occasions     but now having reinstituted that with her breast cancer problems,     she states.  HISTORY:  Wendy Lopez is here today because she would like to be seen here. She lives in the [place] area where she has lived for many years. She was born in [place].  She was found to have an abnormal mammogram plus she could feel an abnormality in her right breast in 2010 after 15 years of Depo-Provera therapy by her gynecologist for excessive uterine bleeding.  After she stopped it, she was felt to be postmenopausal.  I do not know how that was documented, but she still has her uterus and ovaries.  She has several other medical issues including this degenerative disk and joint disease in the back, which is very significant and has led to disability for her.  She still also has some mild tingling  and numbness in both hands bilaterally.  She has degenerative joint changes, at least symptomatology, in multiple joints, she states.  She has this history of grand mal seizures.  She also tells me today that she was treated with 3 cycles of a planned 4 chemotherapy doses by Dr. Jethro Bolus.  She states that, unfortunately, the chemotherapy was based upon a weight of 198 pounds, not 142 pounds. She got very toxic, she states, and received only 3/4 cycles.  She was started on Arimidex, of course, post chemotherapy and developed these tremendous symptoms in her hand, she states, which got better after the cessation of the drug.  She did have to have the 2 tendon releases on the right him by Dr. Merlyn Lot in 2011.  REVIEW OF SYSTEMS:  She has a negative review of systems otherwise.  FAMILY HISTORY:  Negative for breast cancer.  SOCIAL HISTORY:  She does smoke, as mentioned.  She does not use alcohol.  She and her husband have been married many years.  They have a 52 year old son, who is in good health to the best of her knowledge. She did not breast feed her son.  She started menstruating at age 42. Again, sometime during the use  of this Depo-Provera, she states, she stopped menstruating but cannot remember exactly when.  ALLERGIES:  She also states she is allergic to generics because of a common compound that is in their makeup.  PHYSICAL EXAMINATION:  General Appearance:  A very pleasant, young lady who is educated and in no acute distress.  Vital Signs:  Weight is 139 pounds on a 5 foot and 2-1/2 inch frame.  BMI is 25.  Vital Signs: Blood pressure 121/75 in the left arm and sitting position.  Pulse 76 and regular.  Respirations 16 and unlabored.  She is afebrile.  Right now, she is comfortable and not in any pain, she states, unless she moves about.  Heart:  She does not have an obvious heart murmur or click.  Breasts:  The left breast exam is negative.  The right chest wall is clear.   Lymph Nodes:  She has no lymphadenopathy in the cervical, supraclavicular, infraclavicular, axillary, or inguinal areas. Lungs:  Clear but with diminished breath sounds throughout and a few rhonchi.  Neck:  She had no obvious thyromegaly.  HEENT:  Teeth in good repair.  Tongue is normal in the midline.  Pupils are equally round and reactive to light.  Extremities:  She has no peripheral edema in the arms or legs.  Abdomen:  Soft and nontender without hepatosplenomegaly. Bowel sounds are normal.  Neurologic:  She is alert and she is oriented.  PLAN:  I would like to try to get her old records.  We will try to get those as soon as possible.  Tentatively, I would like to have her come back in 3 months, but I would like to get a CBC, diff, and CMET on her today.  I have encouraged her to followup with Dr. Sandria Manly and Dr. Donette Larry on a regular basis, which she will do.  Old records came 04/25/2011.    ______________________________ Ladona Horns. Mariel Sleet, MD ESN/MEDQ  D:  04/22/2011  T:  04/22/2011  Job:  [id number]

## 2011-06-08 DIAGNOSIS — M47817 Spondylosis without myelopathy or radiculopathy, lumbosacral region: Secondary | ICD-10-CM | POA: Diagnosis not present

## 2011-06-08 DIAGNOSIS — M47812 Spondylosis without myelopathy or radiculopathy, cervical region: Secondary | ICD-10-CM | POA: Diagnosis not present

## 2011-06-20 DIAGNOSIS — M47817 Spondylosis without myelopathy or radiculopathy, lumbosacral region: Secondary | ICD-10-CM | POA: Diagnosis not present

## 2011-06-20 DIAGNOSIS — G589 Mononeuropathy, unspecified: Secondary | ICD-10-CM | POA: Diagnosis not present

## 2011-06-20 DIAGNOSIS — Z981 Arthrodesis status: Secondary | ICD-10-CM | POA: Diagnosis not present

## 2011-06-20 DIAGNOSIS — M545 Low back pain, unspecified: Secondary | ICD-10-CM | POA: Diagnosis not present

## 2011-06-20 DIAGNOSIS — M5126 Other intervertebral disc displacement, lumbar region: Secondary | ICD-10-CM | POA: Diagnosis not present

## 2011-06-20 DIAGNOSIS — IMO0001 Reserved for inherently not codable concepts without codable children: Secondary | ICD-10-CM | POA: Diagnosis not present

## 2011-06-22 DIAGNOSIS — J069 Acute upper respiratory infection, unspecified: Secondary | ICD-10-CM | POA: Diagnosis not present

## 2011-07-22 ENCOUNTER — Ambulatory Visit (HOSPITAL_COMMUNITY): Payer: Medicare Other | Admitting: Oncology

## 2011-07-28 DIAGNOSIS — M47817 Spondylosis without myelopathy or radiculopathy, lumbosacral region: Secondary | ICD-10-CM | POA: Diagnosis not present

## 2011-08-04 ENCOUNTER — Other Ambulatory Visit: Payer: Self-pay | Admitting: Neurosurgery

## 2011-08-04 DIAGNOSIS — M47816 Spondylosis without myelopathy or radiculopathy, lumbar region: Secondary | ICD-10-CM

## 2011-08-05 ENCOUNTER — Ambulatory Visit
Admission: RE | Admit: 2011-08-05 | Discharge: 2011-08-05 | Disposition: A | Discharge status: Home / Self Care | Payer: Medicare Other | Source: Ambulatory Visit | Attending: Neurosurgery | Admitting: Neurosurgery

## 2011-08-05 VITALS — BP 131/72 | HR 71

## 2011-08-05 DIAGNOSIS — M47816 Spondylosis without myelopathy or radiculopathy, lumbar region: Secondary | ICD-10-CM

## 2011-08-05 DIAGNOSIS — M47817 Spondylosis without myelopathy or radiculopathy, lumbosacral region: Secondary | ICD-10-CM | POA: Diagnosis not present

## 2011-08-05 DIAGNOSIS — IMO0002 Reserved for concepts with insufficient information to code with codable children: Secondary | ICD-10-CM | POA: Diagnosis not present

## 2011-08-05 MED ORDER — METHYLPREDNISOLONE ACETATE 40 MG/ML INJ SUSP (RADIOLOG
120.0000 mg | Freq: Once | INTRAMUSCULAR | Status: AC
  Administered 2011-08-05: 120 mg via EPIDURAL

## 2011-08-05 MED ORDER — IOHEXOL 180 MG/ML  SOLN
1.0000 mL | Freq: Once | INTRAMUSCULAR | Status: AC | PRN
  Administered 2011-08-05: 1 mL via EPIDURAL

## 2011-08-05 NOTE — Discharge Instructions (Signed)

## 2011-08-22 ENCOUNTER — Ambulatory Visit (HOSPITAL_COMMUNITY): Payer: Medicare Other | Admitting: Oncology

## 2011-08-26 ENCOUNTER — Encounter (HOSPITAL_COMMUNITY): Payer: Self-pay | Admitting: Oncology

## 2011-08-26 ENCOUNTER — Encounter (HOSPITAL_COMMUNITY): Payer: Medicare Other | Attending: Oncology | Admitting: Oncology

## 2011-08-26 VITALS — BP 122/83 | HR 85 | Temp 98.2°F | Ht 62.5 in | Wt 143.5 lb

## 2011-08-26 DIAGNOSIS — R413 Other amnesia: Secondary | ICD-10-CM | POA: Diagnosis not present

## 2011-08-26 DIAGNOSIS — G40909 Epilepsy, unspecified, not intractable, without status epilepticus: Secondary | ICD-10-CM | POA: Diagnosis not present

## 2011-08-26 DIAGNOSIS — G40802 Other epilepsy, not intractable, without status epilepticus: Secondary | ICD-10-CM | POA: Insufficient documentation

## 2011-08-26 DIAGNOSIS — G43909 Migraine, unspecified, not intractable, without status migrainosus: Secondary | ICD-10-CM | POA: Diagnosis not present

## 2011-08-26 DIAGNOSIS — C50919 Malignant neoplasm of unspecified site of unspecified female breast: Secondary | ICD-10-CM | POA: Diagnosis not present

## 2011-08-26 LAB — DIFFERENTIAL
Basophils Absolute: 0.1 10*3/uL (ref 0.0–0.1)
Basophils Relative: 1 % (ref 0–1)
Eosinophils Absolute: 0.1 10*3/uL (ref 0.0–0.7)
Eosinophils Relative: 2 % (ref 0–5)
Lymphocytes Relative: 27 % (ref 12–46)
Lymphs Abs: 2.4 10*3/uL (ref 0.7–4.0)
Monocytes Absolute: 0.6 10*3/uL (ref 0.1–1.0)
Monocytes Relative: 7 % (ref 3–12)
Neutro Abs: 5.7 10*3/uL (ref 1.7–7.7)
Neutrophils Relative %: 64 % (ref 43–77)

## 2011-08-26 LAB — COMPREHENSIVE METABOLIC PANEL
ALT: 13 U/L (ref 0–35)
AST: 15 U/L (ref 0–37)
Albumin: 4.1 g/dL (ref 3.5–5.2)
Alkaline Phosphatase: 139 U/L — ABNORMAL HIGH (ref 39–117)
BUN: 8 mg/dL (ref 6–23)
CO2: 26 mEq/L (ref 19–32)
Calcium: 9.8 mg/dL (ref 8.4–10.5)
Chloride: 103 mEq/L (ref 96–112)
Creatinine, Ser: 0.7 mg/dL (ref 0.50–1.10)
GFR calc Af Amer: >90 mL/min (ref >90)
GFR calc non Af Amer: >90 mL/min (ref >90)
Glucose, Bld: 88 mg/dL (ref 70–99)
Potassium: 3.7 mEq/L (ref 3.5–5.1)
Sodium: 142 mEq/L (ref 135–145)
Total Bilirubin: 0.3 mg/dL (ref 0.3–1.2)
Total Protein: 6.5 g/dL (ref 6.0–8.3)

## 2011-08-26 LAB — CBC
HCT: 44.8 % (ref 36.0–46.0)
Hemoglobin: 15.9 g/dL — ABNORMAL HIGH (ref 12.0–15.0)
MCH: 31.8 pg (ref 26.0–34.0)
MCHC: 35.5 g/dL (ref 30.0–36.0)
MCV: 89.6 fL (ref 78.0–100.0)
Platelets: 182 10*3/uL (ref 150–400)
RBC: 5 MIL/uL (ref 3.87–5.11)
RDW: 13 % (ref 11.5–15.5)
WBC: 8.9 10*3/uL (ref 4.0–10.5)

## 2011-08-26 NOTE — Patient Instructions (Addendum)
Wendy Lopez  [id number] [birth year] Dr. Glenford Peers   [facility]  Discharge Instructions  RECOMMENDATIONS MADE BY THE CONSULTANT AND ANY TEST RESULTS WILL BE SENT TO YOUR REFERRING DOCTOR.   EXAM FINDINGS BY MD TODAY AND SIGNS AND SYMPTOMS TO REPORT TO CLINIC OR PRIMARY MD: exam and discussion per MD.  We will check some labs today to see if we can find a reason for your memory issues.  MEDICATIONS PRESCRIBED: none   INSTRUCTIONS GIVEN AND DISCUSSED: Other :  Report any lumps, bone pain or shortness of breath.  SPECIAL INSTRUCTIONS/FOLLOW-UP: Lab work Needed today and Return to Clinic in 6 months.   I acknowledge that I have been informed and understand all the instructions given to me and received a copy. I do not have any more questions at this time, but understand that I may call the Specialty Clinic at [facility] at [phone] during business hours should I have any further questions or need assistance in obtaining follow-up care.    __________________________________________  _____________  __________ Signature of Patient or Authorized Representative            Date                   Time    __________________________________________ Nurse's Signature

## 2011-08-26 NOTE — Progress Notes (Signed)
This office note has been dictated.

## 2011-08-26 NOTE — Progress Notes (Signed)
CC:   Georgann Housekeeper, MD Genene Churn. Love, M.D. Angelia Mould. Derrell Lolling, M.D.  DIAGNOSIS: 1. History of breast cancer, stage I disease (T1c N0), ER positive     99%, PR positive 54%, Ki-67 marker 15%, HER-2/neu was low at 15%     and Oncotype score was an ER level of 10.2 which is positive, PR     score of 5.7 which is positive, HER-2/neu score of 9.1 which is     negative.  Her overall Oncotype score was 23.  She was therefore     offered chemotherapy for this tumor.  Three nodes were negative.     CISH for HER-2 was not over-amplified either by that test as well     as the Oncotype.  She received only 3 cycles of adjuvant Taxotere     and Cytoxan and it sounds like was stopped due to intolerance.  She     was placed on adjuvant Arimidex.  She had to stop that due to     intolerance. 2. History of seizure disorder on the below-mentioned medications. 3. Memory loss and she does not see Dr. Sandria Manly until the end of June.  MEDICATIONS:  Presently include Lamictal, Dilantin, albuterol, Excedrin p.r.n. for migraines, Zyrtec and Vicodin rarely.  She states that her husband is very concerned about her.  She made the statement that he does not think that she should be left alone any more. She is having trouble finding how to get back home after she drives places.  She is not truly having headaches, per se.  She is not having any new troubles walking.  She does have some back problems.  States that she falls or at least collapses, goes to the ground once a month. That is not new or different.  She has had bad back problems for quite some time and her legs give away, she states.  She sometimes wears knee pads so she does not hurt her knees.  She does not have those on today, but she states that her memory really is becoming an issue for her.  She has not seen Melbourne Abts in several months, it sounds like, she cannot remember when she saw him.  She can remember the day of the week, the date, the year, the  Economist, the [employer].  She did not know the [address] senators.  She cannot do serial 7's.  She cannot multiply simple numbers.  She did know our Marketing executive, Mr. Shela Nevin.  She states that she had to be reminded what day of the week it was by her husband this morning which is how she knew the day of the week.  PHYSICAL EXAMINATION:  General:  She is alert.  She certainly appears oriented.  Facial symmetry appears intact.  She got up out of the chair spontaneously and moved to the table easily without any balance issues. She did not have a wide-based gait.  She had no leg edema.  No arm edema.  Right chest wall is clear.  She has mentioned that the nerve in that right chest wall is starting to come back and she can feel more and more of the breast prosthesis when she puts it on now.  She had no adenopathy in the cervical, supraclavicular, infraclavicular, axillary, or inguinal areas.  The left breast was negative for masses.  Abdomen: Soft and nontender without organomegaly.  Bowel sounds were normal. Heart:  Regular rhythm and rate without murmur, rub or  gallop.  Lungs: Clear to auscultation and percussion.  So she looks very good, but I think we ought to check a few things before she sees Dr. Sandria Manly, namely a Lamictal level, Dilantin level, B12, folic acid, and TSH so he will have these things at his disposal.  I will let him decide whether she needs an MRI scan but if there are any distinct abnormalities of these labs, I will be in touch with him. We will also check a CBC and liver enzymes. I will see her in 6 months one way or the other.    ______________________________ Ladona Horns. Mariel Sleet, MD ESN/MEDQ  D:  08/26/2011  T:  08/26/2011  Job:  [id number]

## 2011-08-27 LAB — VITAMIN B12: Vitamin B-12: 257 pg/mL (ref 211–911)

## 2011-08-27 LAB — RPR: RPR Ser Ql: NONREACTIVE

## 2011-08-27 LAB — LAMOTRIGINE LEVEL: Lamotrigine Lvl: 7.8 ug/mL (ref 3.0–14.0)

## 2011-08-27 LAB — TSH: TSH: 0.676 u[IU]/mL (ref 0.350–4.500)

## 2011-08-27 LAB — FOLATE: Folate: 15 ng/mL

## 2011-08-30 LAB — PHENYTOIN LEVEL, FREE AND TOTAL
Phenytoin Bound: 3.9 mg/L
Phenytoin, Free: 0.7 mg/L — ABNORMAL LOW (ref 1.0–2.0)
Phenytoin, Total: 4.6 mg/L — ABNORMAL LOW (ref 10.0–20.0)

## 2011-09-06 DIAGNOSIS — J069 Acute upper respiratory infection, unspecified: Secondary | ICD-10-CM | POA: Diagnosis not present

## 2011-10-06 DIAGNOSIS — R0989 Other specified symptoms and signs involving the circulatory and respiratory systems: Secondary | ICD-10-CM | POA: Diagnosis not present

## 2011-10-06 DIAGNOSIS — I10 Essential (primary) hypertension: Secondary | ICD-10-CM | POA: Diagnosis not present

## 2011-10-06 DIAGNOSIS — R0609 Other forms of dyspnea: Secondary | ICD-10-CM | POA: Diagnosis not present

## 2011-10-14 DIAGNOSIS — G40119 Localization-related (focal) (partial) symptomatic epilepsy and epileptic syndromes with simple partial seizures, intractable, without status epilepticus: Secondary | ICD-10-CM | POA: Diagnosis not present

## 2011-10-14 DIAGNOSIS — R413 Other amnesia: Secondary | ICD-10-CM | POA: Diagnosis not present

## 2011-10-14 DIAGNOSIS — E538 Deficiency of other specified B group vitamins: Secondary | ICD-10-CM | POA: Diagnosis not present

## 2011-10-14 DIAGNOSIS — F329 Major depressive disorder, single episode, unspecified: Secondary | ICD-10-CM | POA: Diagnosis not present

## 2011-10-18 DIAGNOSIS — R0609 Other forms of dyspnea: Secondary | ICD-10-CM | POA: Diagnosis not present

## 2011-10-18 DIAGNOSIS — E782 Mixed hyperlipidemia: Secondary | ICD-10-CM | POA: Diagnosis not present

## 2011-10-18 DIAGNOSIS — R9431 Abnormal electrocardiogram [ECG] [EKG]: Secondary | ICD-10-CM | POA: Diagnosis not present

## 2011-10-18 DIAGNOSIS — R0602 Shortness of breath: Secondary | ICD-10-CM

## 2011-10-18 DIAGNOSIS — I2789 Other specified pulmonary heart diseases: Secondary | ICD-10-CM | POA: Diagnosis not present

## 2011-10-18 DIAGNOSIS — E559 Vitamin D deficiency, unspecified: Secondary | ICD-10-CM | POA: Diagnosis not present

## 2011-10-18 DIAGNOSIS — R0989 Other specified symptoms and signs involving the circulatory and respiratory systems: Secondary | ICD-10-CM | POA: Diagnosis not present

## 2011-10-18 HISTORY — DX: Shortness of breath: R06.02

## 2011-10-21 ENCOUNTER — Other Ambulatory Visit: Payer: Self-pay | Admitting: Neurology

## 2011-10-21 DIAGNOSIS — R413 Other amnesia: Secondary | ICD-10-CM

## 2011-10-26 ENCOUNTER — Ambulatory Visit
Admission: RE | Admit: 2011-10-26 | Discharge: 2011-10-26 | Disposition: A | Discharge status: Home / Self Care | Payer: Medicare Other | Source: Ambulatory Visit | Attending: Neurology | Admitting: Neurology

## 2011-10-26 DIAGNOSIS — R413 Other amnesia: Secondary | ICD-10-CM | POA: Diagnosis not present

## 2011-11-01 DIAGNOSIS — R569 Unspecified convulsions: Secondary | ICD-10-CM | POA: Diagnosis not present

## 2011-11-01 DIAGNOSIS — J309 Allergic rhinitis, unspecified: Secondary | ICD-10-CM | POA: Diagnosis not present

## 2011-11-01 DIAGNOSIS — M549 Dorsalgia, unspecified: Secondary | ICD-10-CM | POA: Diagnosis not present

## 2011-11-01 DIAGNOSIS — C50919 Malignant neoplasm of unspecified site of unspecified female breast: Secondary | ICD-10-CM | POA: Diagnosis not present

## 2011-11-01 DIAGNOSIS — R413 Other amnesia: Secondary | ICD-10-CM | POA: Diagnosis not present

## 2011-11-01 DIAGNOSIS — R002 Palpitations: Secondary | ICD-10-CM | POA: Diagnosis not present

## 2011-11-01 DIAGNOSIS — Z23 Encounter for immunization: Secondary | ICD-10-CM | POA: Diagnosis not present

## 2011-11-01 DIAGNOSIS — F17200 Nicotine dependence, unspecified, uncomplicated: Secondary | ICD-10-CM | POA: Diagnosis not present

## 2011-11-10 DIAGNOSIS — M47812 Spondylosis without myelopathy or radiculopathy, cervical region: Secondary | ICD-10-CM | POA: Diagnosis not present

## 2011-11-10 DIAGNOSIS — M47817 Spondylosis without myelopathy or radiculopathy, lumbosacral region: Secondary | ICD-10-CM | POA: Diagnosis not present

## 2011-11-25 DIAGNOSIS — J069 Acute upper respiratory infection, unspecified: Secondary | ICD-10-CM | POA: Diagnosis not present

## 2011-12-01 DIAGNOSIS — I270 Primary pulmonary hypertension: Secondary | ICD-10-CM | POA: Diagnosis not present

## 2011-12-01 DIAGNOSIS — R079 Chest pain, unspecified: Secondary | ICD-10-CM | POA: Diagnosis not present

## 2011-12-24 DIAGNOSIS — Z23 Encounter for immunization: Secondary | ICD-10-CM | POA: Diagnosis not present

## 2011-12-29 DIAGNOSIS — R413 Other amnesia: Secondary | ICD-10-CM | POA: Diagnosis not present

## 2012-01-06 DIAGNOSIS — R413 Other amnesia: Secondary | ICD-10-CM | POA: Diagnosis not present

## 2012-01-06 DIAGNOSIS — F329 Major depressive disorder, single episode, unspecified: Secondary | ICD-10-CM | POA: Diagnosis not present

## 2012-01-25 DIAGNOSIS — R413 Other amnesia: Secondary | ICD-10-CM | POA: Diagnosis not present

## 2012-01-25 DIAGNOSIS — F329 Major depressive disorder, single episode, unspecified: Secondary | ICD-10-CM | POA: Diagnosis not present

## 2012-02-09 DIAGNOSIS — M47817 Spondylosis without myelopathy or radiculopathy, lumbosacral region: Secondary | ICD-10-CM | POA: Diagnosis not present

## 2012-02-09 DIAGNOSIS — M47812 Spondylosis without myelopathy or radiculopathy, cervical region: Secondary | ICD-10-CM | POA: Diagnosis not present

## 2012-02-20 ENCOUNTER — Ambulatory Visit: Payer: Medicare Other | Admitting: Psychology

## 2012-02-27 ENCOUNTER — Ambulatory Visit (HOSPITAL_COMMUNITY): Payer: Medicare Other | Admitting: Oncology

## 2012-03-02 ENCOUNTER — Ambulatory Visit (HOSPITAL_COMMUNITY): Payer: Medicare Other | Admitting: Oncology

## 2012-03-06 DIAGNOSIS — M899 Disorder of bone, unspecified: Secondary | ICD-10-CM | POA: Diagnosis not present

## 2012-03-06 DIAGNOSIS — M949 Disorder of cartilage, unspecified: Secondary | ICD-10-CM | POA: Diagnosis not present

## 2012-03-06 DIAGNOSIS — Z853 Personal history of malignant neoplasm of breast: Secondary | ICD-10-CM | POA: Diagnosis not present

## 2012-03-08 ENCOUNTER — Encounter (HOSPITAL_COMMUNITY): Payer: Self-pay | Admitting: *Deleted

## 2012-03-09 ENCOUNTER — Encounter (HOSPITAL_COMMUNITY): Payer: Self-pay | Admitting: Pharmacy Technician

## 2012-03-13 ENCOUNTER — Ambulatory Visit (HOSPITAL_COMMUNITY): Payer: Medicare Other | Admitting: Anesthesiology

## 2012-03-13 ENCOUNTER — Encounter (HOSPITAL_COMMUNITY): Payer: Self-pay | Admitting: Anesthesiology

## 2012-03-13 ENCOUNTER — Encounter (HOSPITAL_COMMUNITY): Payer: Self-pay | Admitting: *Deleted

## 2012-03-13 ENCOUNTER — Encounter (HOSPITAL_COMMUNITY)
Admission: RE | Disposition: A | Discharge status: Home / Self Care | Payer: Self-pay | Source: Ambulatory Visit | Attending: Gastroenterology

## 2012-03-13 ENCOUNTER — Ambulatory Visit (HOSPITAL_COMMUNITY)
Admission: RE | Admit: 2012-03-13 | Discharge: 2012-03-13 | Disposition: A | Discharge status: Home / Self Care | Payer: Medicare Other | Source: Ambulatory Visit | Attending: Gastroenterology | Admitting: Gastroenterology

## 2012-03-13 ENCOUNTER — Telehealth (HOSPITAL_COMMUNITY): Payer: Self-pay

## 2012-03-13 DIAGNOSIS — M949 Disorder of cartilage, unspecified: Secondary | ICD-10-CM | POA: Insufficient documentation

## 2012-03-13 DIAGNOSIS — K219 Gastro-esophageal reflux disease without esophagitis: Secondary | ICD-10-CM | POA: Insufficient documentation

## 2012-03-13 DIAGNOSIS — Z1211 Encounter for screening for malignant neoplasm of colon: Secondary | ICD-10-CM | POA: Insufficient documentation

## 2012-03-13 DIAGNOSIS — R1310 Dysphagia, unspecified: Secondary | ICD-10-CM | POA: Diagnosis not present

## 2012-03-13 DIAGNOSIS — Z853 Personal history of malignant neoplasm of breast: Secondary | ICD-10-CM | POA: Diagnosis not present

## 2012-03-13 DIAGNOSIS — I059 Rheumatic mitral valve disease, unspecified: Secondary | ICD-10-CM | POA: Insufficient documentation

## 2012-03-13 DIAGNOSIS — D128 Benign neoplasm of rectum: Secondary | ICD-10-CM | POA: Diagnosis not present

## 2012-03-13 DIAGNOSIS — M899 Disorder of bone, unspecified: Secondary | ICD-10-CM | POA: Insufficient documentation

## 2012-03-13 DIAGNOSIS — R1013 Epigastric pain: Secondary | ICD-10-CM | POA: Diagnosis not present

## 2012-03-13 DIAGNOSIS — R142 Eructation: Secondary | ICD-10-CM | POA: Insufficient documentation

## 2012-03-13 DIAGNOSIS — R141 Gas pain: Secondary | ICD-10-CM | POA: Insufficient documentation

## 2012-03-13 DIAGNOSIS — D129 Benign neoplasm of anus and anal canal: Secondary | ICD-10-CM | POA: Diagnosis not present

## 2012-03-13 DIAGNOSIS — R12 Heartburn: Secondary | ICD-10-CM | POA: Diagnosis not present

## 2012-03-13 DIAGNOSIS — R143 Flatulence: Secondary | ICD-10-CM | POA: Diagnosis not present

## 2012-03-13 HISTORY — DX: Other specified postprocedural states: R11.2

## 2012-03-13 HISTORY — PX: COLONOSCOPY WITH PROPOFOL: SHX5780

## 2012-03-13 HISTORY — DX: Personal history of other diseases of the digestive system: Z87.19

## 2012-03-13 HISTORY — DX: Other specified postprocedural states: Z98.890

## 2012-03-13 HISTORY — DX: Other specified disorders of bone density and structure, unspecified site: M85.80

## 2012-03-13 HISTORY — DX: Cardiac arrhythmia, unspecified: I49.9

## 2012-03-13 HISTORY — PX: ESOPHAGOGASTRODUODENOSCOPY: SHX5428

## 2012-03-13 SURGERY — COLONOSCOPY WITH PROPOFOL
Anesthesia: Monitor Anesthesia Care

## 2012-03-13 MED ORDER — BUTAMBEN-TETRACAINE-BENZOCAINE 2-2-14 % EX AERO
INHALATION_SPRAY | CUTANEOUS | Status: DC | PRN
  Administered 2012-03-13: 2 via TOPICAL

## 2012-03-13 MED ORDER — LIDOCAINE HCL (CARDIAC) 20 MG/ML IV SOLN
INTRAVENOUS | Status: DC | PRN
  Administered 2012-03-13 (×2): 50 mg via INTRAVENOUS

## 2012-03-13 MED ORDER — FENTANYL CITRATE 0.05 MG/ML IJ SOLN
INTRAMUSCULAR | Status: DC | PRN
  Administered 2012-03-13: 100 ug via INTRAVENOUS

## 2012-03-13 MED ORDER — ONDANSETRON HCL 4 MG/2ML IJ SOLN
INTRAMUSCULAR | Status: DC | PRN
  Administered 2012-03-13: 4 mg via INTRAVENOUS

## 2012-03-13 MED ORDER — PROPOFOL INFUSION 10 MG/ML OPTIME
INTRAVENOUS | Status: DC | PRN
  Administered 2012-03-13: 200 ug/kg/min via INTRAVENOUS

## 2012-03-13 MED ORDER — LACTATED RINGERS IV SOLN
INTRAVENOUS | Status: DC | PRN
  Administered 2012-03-13: 13:00:00 via INTRAVENOUS

## 2012-03-13 MED ORDER — LACTATED RINGERS IV SOLN
INTRAVENOUS | Status: DC
  Administered 2012-03-13: 1000 mL via INTRAVENOUS

## 2012-03-13 SURGICAL SUPPLY — 22 items

## 2012-03-13 NOTE — Telephone Encounter (Signed)
Message left for patient to call clinic regarding results of bone density and if she's taking calcium or vitamin D.

## 2012-03-13 NOTE — Addendum Note (Signed)
Addendum  created 03/13/12 1449 by Azell Der, MD   Modules edited:Anesthesia Attestations

## 2012-03-13 NOTE — H&P (Signed)
  Problem: Screening colonoscopy and diagnostic esophagogastroduodenoscopy to evaluate epigastric discomfort and heartburn.  History: The patient is a 51 year old female born 02/21/1961. The patient is scheduled to undergo her first screening colonoscopy with polypectomy to prevent colon cancer.  Patient is experiencing intermittent epigastric bloating and heartburn unassociated with dysphagia and odynophagia. She scheduled to undergo a diagnostic esophagogastroduodenoscopy.  Medication allergies: Latex. Tegretol. Iodine. Valium. Vicodin. Carbamazepine. Allegra. Ultram.   Past medical history: Interstitial cystitis. Breast cancer. Mastectomy. Seizure disorder. Gastroesophageal reflux. Osteopenia. Tinnitus with hearing loss. Mitral valve prolapse syndrome. Cervical degenerative disc disease. Memory impairment. Anxiety. Depression. Tobacco use. Lumbar fusion. Lumbar discectomy. Conization.  Exam: The patient is alert and lying comfortably on the endoscopy stretcher. Sclera are nonicteric. Lungs are clear to auscultation. Cardiac exam reveals a regular rhythm without audible murmurs. Patient has a history of mitral valve prolapse syndrome. Abdomen is soft, flat, and nontender to palpation in all quadrants.  Plan: Proceed with screening colonoscopy and polypectomy to prevent colon cancer. Proceed with diagnostic esophagogastroduodenoscopy to evaluate epigastric discomfort and chronic heartburn.

## 2012-03-13 NOTE — Addendum Note (Signed)
Addendum  created 03/13/12 1448 by Azell Der, MD   Modules edited:Anesthesia Attestations

## 2012-03-13 NOTE — Anesthesia Preprocedure Evaluation (Addendum)
Anesthesia Evaluation  Patient identified by MRN, date of birth, ID band Patient awake    Reviewed: Allergy & Precautions, H&P , NPO status , Patient's Chart, lab work & pertinent test results, reviewed documented beta blocker date and time   History of Anesthesia Complications (+) PONV  Airway Mallampati: II TM Distance: >3 FB Neck ROM: full    Dental No notable dental hx.    Pulmonary Current Smoker,  breath sounds clear to auscultation  Pulmonary exam normal       Cardiovascular Exercise Tolerance: Good negative cardio ROS  + dysrhythmias Rhythm:regular Rate:Normal  MVP with palpitations.   Neuro/Psych Seizures -,  negative neurological ROS  negative psych ROS   GI/Hepatic Neg liver ROS, hiatal hernia,   Endo/Other  negative endocrine ROS  Renal/GU negative Renal ROS  negative genitourinary   Musculoskeletal   Abdominal   Peds  Hematology negative hematology ROS (+)   Anesthesia Other Findings   Reproductive/Obstetrics negative OB ROS                          Anesthesia Physical Anesthesia Plan  ASA: II  Anesthesia Plan: MAC   Post-op Pain Management:    Induction: Intravenous  Airway Management Planned:   Additional Equipment:   Intra-op Plan:   Post-operative Plan:   Informed Consent: I have reviewed the patients History and Physical, chart, labs and discussed the procedure including the risks, benefits and alternatives for the proposed anesthesia with the patient or authorized representative who has indicated his/her understanding and acceptance.   Dental Advisory Given  Plan Discussed with: CRNA  Anesthesia Plan Comments:         Anesthesia Quick Evaluation

## 2012-03-13 NOTE — Op Note (Signed)
Procedure: Screening colonoscopy.  Endoscopist: Danise Edge  Premedication: Propofol administered by anesthesia  Procedure: Anal inspection and digital rectal exam were normal. The Pentax pediatric colonoscope was introduced into the rectum and advanced to the cecum. A normal-appearing ileocecal valve and appendiceal orifice were identified. Colonic preparation for the exam today was good.  Rectum. From the distal rectum a 7 mm sessile polyp was removed with the electrocautery snare and submitted for pathologic interpretation.  Sigmoid colon and descending colon. Normal.  Splenic flexure. Normal.  Transverse colon. Normal.  Hepatic flexure. Normal.  Ascending colon. Normal  Cecum and ileocecal valve. Normal.  Assessment: A 7 mm sessile polyp was removed distal rectum with the electrocautery snare; otherwise normal screening proctocolonoscopy to the cecum.  Recommendations: If the rectal polyp returns neoplastic pathologically, the patient should undergo a surveillance colonoscopy in 5 years. If the rectal polyp returns non-neoplastic pathologically, the patient should undergo a repeat screening colonoscopy in 10 years.  Procedure: Diagnostic esophagogastroduodenoscopy to evaluate chronic heartburn and epigastric bloating.  Endoscopist: Danise Edge  Premedication propofol administered by anesthesia  Procedure: The patient was placed in the left lateral decubitus position. The Pentax gastroscope was passed through the posterior hypopharynx into the proximal esophagus without difficulty. The hypopharynx, larynx, and vocal cords appeared normal.  Esophagoscopy: The proximal, mid, and lower segments of the esophageal mucosa appear normal. The squamocolumnar junction was regular and noted at approximately 40 cm from the incisor teeth. There is no endoscopic evidence for the presence of erosive esophagitis, Barrett's esophagus, or esophageal stricture formation.  Gastroscopy:  Retroflexed view of the gastric cardia and fundus was normal. The gastric body, antrum, and pylorus appeared normal.  Duodenoscopy: The duodenal bulb and descending duodenum appeared normal.  Assessment: Normal esophagogastroduodenoscopy.

## 2012-03-13 NOTE — Transfer of Care (Signed)
Immediate Anesthesia Transfer of Care Note  Patient: Wendy Lopez  Procedure(s) Performed: Procedure(s) (LRB) with comments: COLONOSCOPY WITH PROPOFOL (N/A) ESOPHAGOGASTRODUODENOSCOPY (EGD) (N/A) - Reflux, Hiatal Hernia  Patient Location: PACU  Anesthesia Type:MAC  Level of Consciousness: awake, alert , oriented and patient cooperative  Airway & Oxygen Therapy: Patient Spontanous Breathing  Post-op Assessment: Report given to PACU RN, Post -op Vital signs reviewed and stable and Patient moving all extremities X 4  Post vital signs: stable  Complications: No apparent anesthesia complications

## 2012-03-13 NOTE — Anesthesia Postprocedure Evaluation (Signed)
  Anesthesia Post-op Note  Patient: Wendy Lopez  Procedure(s) Performed: Procedure(s) (LRB): COLONOSCOPY WITH PROPOFOL (N/A) ESOPHAGOGASTRODUODENOSCOPY (EGD) (N/A)  Patient Location: PACU  Anesthesia Type: MAC  Level of Consciousness: awake and alert   Airway and Oxygen Therapy: Patient Spontanous Breathing  Post-op Pain: mild  Post-op Assessment: Post-op Vital signs reviewed, Patient's Cardiovascular Status Stable, Respiratory Function Stable, Patent Airway and No signs of Nausea or vomiting  Last Vitals:  Filed Vitals:   03/13/12 1440  BP: 135/83  Pulse:   Temp:   Resp: 19    Post-op Vital Signs: stable   Complications: No apparent anesthesia complications

## 2012-03-14 ENCOUNTER — Encounter (HOSPITAL_COMMUNITY): Payer: Self-pay | Admitting: Gastroenterology

## 2012-03-20 ENCOUNTER — Ambulatory Visit (INDEPENDENT_AMBULATORY_CARE_PROVIDER_SITE_OTHER): Payer: Medicare Other | Admitting: Psychology

## 2012-03-20 DIAGNOSIS — F331 Major depressive disorder, recurrent, moderate: Secondary | ICD-10-CM

## 2012-03-26 ENCOUNTER — Encounter (HOSPITAL_COMMUNITY): Payer: Self-pay | Admitting: Oncology

## 2012-03-26 ENCOUNTER — Encounter (HOSPITAL_COMMUNITY): Payer: Medicare Other | Attending: Oncology | Admitting: Oncology

## 2012-03-26 VITALS — BP 128/83 | HR 75 | Temp 98.2°F | Resp 18

## 2012-03-26 DIAGNOSIS — C50919 Malignant neoplasm of unspecified site of unspecified female breast: Secondary | ICD-10-CM

## 2012-03-26 DIAGNOSIS — Z170 Estrogen receptor positive status [ER+]: Secondary | ICD-10-CM

## 2012-03-26 DIAGNOSIS — J4489 Other specified chronic obstructive pulmonary disease: Secondary | ICD-10-CM | POA: Insufficient documentation

## 2012-03-26 DIAGNOSIS — F17200 Nicotine dependence, unspecified, uncomplicated: Secondary | ICD-10-CM | POA: Diagnosis not present

## 2012-03-26 DIAGNOSIS — M899 Disorder of bone, unspecified: Secondary | ICD-10-CM | POA: Diagnosis not present

## 2012-03-26 DIAGNOSIS — M949 Disorder of cartilage, unspecified: Secondary | ICD-10-CM

## 2012-03-26 DIAGNOSIS — J449 Chronic obstructive pulmonary disease, unspecified: Secondary | ICD-10-CM | POA: Diagnosis not present

## 2012-03-26 DIAGNOSIS — F3289 Other specified depressive episodes: Secondary | ICD-10-CM | POA: Insufficient documentation

## 2012-03-26 DIAGNOSIS — F329 Major depressive disorder, single episode, unspecified: Secondary | ICD-10-CM | POA: Diagnosis not present

## 2012-03-26 LAB — COMPREHENSIVE METABOLIC PANEL
ALT: 27 U/L (ref 0–35)
AST: 23 U/L (ref 0–37)
Albumin: 3.9 g/dL (ref 3.5–5.2)
Alkaline Phosphatase: 121 U/L — ABNORMAL HIGH (ref 39–117)
BUN: 9 mg/dL (ref 6–23)
CO2: 28 mEq/L (ref 19–32)
Calcium: 9.2 mg/dL (ref 8.4–10.5)
Chloride: 104 mEq/L (ref 96–112)
Creatinine, Ser: 0.74 mg/dL (ref 0.50–1.10)
GFR calc Af Amer: >90 mL/min (ref >90)
GFR calc non Af Amer: >90 mL/min (ref >90)
Glucose, Bld: 86 mg/dL (ref 70–99)
Potassium: 4.1 mEq/L (ref 3.5–5.1)
Sodium: 140 mEq/L (ref 135–145)
Total Bilirubin: 0.2 mg/dL — ABNORMAL LOW (ref 0.3–1.2)
Total Protein: 6.9 g/dL (ref 6.0–8.3)

## 2012-03-26 LAB — CBC WITH DIFFERENTIAL/PLATELET
Basophils Absolute: 0.1 10*3/uL (ref 0.0–0.1)
Basophils Relative: 1 % (ref 0–1)
Eosinophils Absolute: 0.2 10*3/uL (ref 0.0–0.7)
Eosinophils Relative: 3 % (ref 0–5)
HCT: 45.7 % (ref 36.0–46.0)
Hemoglobin: 16.3 g/dL — ABNORMAL HIGH (ref 12.0–15.0)
Lymphocytes Relative: 45 % (ref 12–46)
Lymphs Abs: 3.2 10*3/uL (ref 0.7–4.0)
MCH: 31.7 pg (ref 26.0–34.0)
MCHC: 35.7 g/dL (ref 30.0–36.0)
MCV: 88.9 fL (ref 78.0–100.0)
Monocytes Absolute: 0.6 10*3/uL (ref 0.1–1.0)
Monocytes Relative: 9 % (ref 3–12)
Neutro Abs: 3 10*3/uL (ref 1.7–7.7)
Neutrophils Relative %: 42 % — ABNORMAL LOW (ref 43–77)
Platelets: 179 10*3/uL (ref 150–400)
RBC: 5.14 MIL/uL — ABNORMAL HIGH (ref 3.87–5.11)
RDW: 13.1 % (ref 11.5–15.5)
WBC: 7.1 10*3/uL (ref 4.0–10.5)

## 2012-03-26 NOTE — Progress Notes (Signed)
Problem #1 breast cancer, stage I, ER +99%, PR +54%, Ki-67 marker 15%, HER-2/neu nonamplified. Oncotype score was intermediate and she was offered chemotherapy. On tattoo is 23. 3 nodes were negative. She took 3 cycles of Taxotere/Cytoxan but stopped due to intolerance, she was placed on Arimidex which she stopped due to intolerance. Problem #2 depression Problem #3 history of seizure disorder on Lamictal and Dilantin Problem #4 degenerative joint disease status post back surgery Problem #5 history of migraines Problem #6 osteopenia Problem #7 COPD, still She does not appear to have memory issues today and her oncology review systems is negative. She states she occasionally does not always recognize places when she is driving but doesn't get lost. She answers every question appropriately today.  She does have osteopenia and will take calcium once a day, 3 times per week. She needs to go back on vitamin D which she has been low on in the past, 1000- 2000 units per day. Vital signs are stable. Unfortunately she still smoking. Lungs remain clear but with diminished breath sounds. She has no lymphadenopathy. The right chest wall is clear. The left breast is negative for masses but I think there are fibroglandular changes especially the upper inner and outer quadrant. Heart shows no murmur rub or gallop. There is a regular rhythm and rate. Abdomen is soft and nontender without hepatosplenomegaly. Bowel sounds are normal. She has no leg or arm edema.  We will see her in 6 months and follow her along.

## 2012-03-26 NOTE — Progress Notes (Signed)
Wendy Lopez presented for labwork. Labs per MD order drawn via Peripheral Line 23 gauge needle inserted in left AC  Good blood return present. Procedure without incident.  Needle removed intact. Patient tolerated procedure well.

## 2012-03-26 NOTE — Patient Instructions (Addendum)
[facility] Discharge Instructions  RECOMMENDATIONS MADE BY THE CONSULTANT AND ANY TEST RESULTS WILL BE SENT TO YOUR REFERRING PHYSICIAN.  EXAM FINDINGS BY THE PHYSICIAN TODAY AND SIGNS OR SYMPTOMS TO REPORT TO CLINIC OR PRIMARY PHYSICIAN: exam and discussion by MD.  No evidence of recurrence by exam.  We will check some blood work today and if there are any problems we will call you.  MEDICATIONS PRESCRIBED:  Calcium try taking 1 times weekly.  Needs to be taken either 2 hours before or 2 hours after your seizure medication.  INSTRUCTIONS GIVEN AND DISCUSSED: Report any new lumps, bone pain or shortness of breath.  SPECIAL INSTRUCTIONS/FOLLOW-UP: Lab work to day and to follow-up with PA in 6 months.  Thank you for choosing [facility] to provide your oncology and hematology care.  To afford each patient quality time with our providers, please arrive at least 15 minutes before your scheduled appointment time.  With your help, our goal is to use those 15 minutes to complete the necessary work-up to ensure our physicians have the information they need to help with your evaluation and healthcare recommendations.    Effective January 1st, 2014, we ask that you re-schedule your appointment with our physicians should you arrive 10 or more minutes late for your appointment.  We strive to give you quality time with our providers, and arriving late affects you and other patients whose appointments are after yours.    Again, thank you for choosing [facility].  Our hope is that these requests will decrease the amount of time that you wait before being seen by our physicians.       _____________________________________________________________  I acknowledge that I have been informed and understand all the instructions given to me and received a copy. I do not have anymore questions at this time but understand that I may call the Cancer Center at [facility]  at [phone] during business hours should I have any further questions or need assistance in obtaining follow-up care.    __________________________________________  _____________  __________ Signature of Patient or Authorized Representative            Date                   Time    __________________________________________ Nurse's Signature

## 2012-04-02 ENCOUNTER — Encounter: Payer: Self-pay | Admitting: Oncology

## 2012-04-05 ENCOUNTER — Ambulatory Visit (INDEPENDENT_AMBULATORY_CARE_PROVIDER_SITE_OTHER): Payer: Medicare Other | Admitting: Psychology

## 2012-04-05 DIAGNOSIS — F331 Major depressive disorder, recurrent, moderate: Secondary | ICD-10-CM | POA: Diagnosis not present

## 2012-04-16 ENCOUNTER — Ambulatory Visit (INDEPENDENT_AMBULATORY_CARE_PROVIDER_SITE_OTHER): Payer: Medicare Other | Admitting: General Surgery

## 2012-04-16 ENCOUNTER — Encounter (INDEPENDENT_AMBULATORY_CARE_PROVIDER_SITE_OTHER): Payer: Self-pay | Admitting: General Surgery

## 2012-04-16 VITALS — BP 132/94 | HR 76 | Temp 98.5°F | Resp 16 | Ht 62.0 in | Wt 138.2 lb

## 2012-04-16 DIAGNOSIS — C50919 Malignant neoplasm of unspecified site of unspecified female breast: Secondary | ICD-10-CM | POA: Diagnosis not present

## 2012-04-16 DIAGNOSIS — C50911 Malignant neoplasm of unspecified site of right female breast: Secondary | ICD-10-CM

## 2012-04-16 HISTORY — DX: Malignant neoplasm of unspecified site of right female breast: C50.911

## 2012-04-16 NOTE — Progress Notes (Signed)
Patient ID: Wendy Lopez, female   DOB: 1960-10-03, 51 y.o.   MRN: [id number]  Chief Complaint  Patient presents with  . Breast Cancer Long Term Follow Up    reck breast    HPI [name] is a 51 y.o. female.  She returns for a long-term followup regarding her right breast cancer.  On 02/13/2009 this patient underwent right total mastectomy and sentinel node biopsy. 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 She has no recurrence to date.  She is followed now by Glenford Peers. She is on no medication. He had trigger finger problems in her hand which were thought to be due to arimidex.  Left breast mammogram on 03/06/2012 is normal, category 1.  Significant changes are that she is now being treated for  clinical depression. She is under the guidance of Dr. Dellia Cloud of the psychological service. She continues to be followed by Dr. Eula Listen for primary care medicine. HPI  Past Medical History  Diagnosis Date  . Spinal fusion failure     3  . Mitral valve prolapse   . Arthritis   . Breast cancer 02/13/2009  . Dysrhythmia     palpitations  . H/O hiatal hernia   . Epilepsy     last seizure 1990's  . PONV (postoperative nausea and vomiting)   . Osteopenia   . Depression   . Cancer of right breast 04/16/2012    Past Surgical History  Procedure Date  . Tonsillectomy   . Spine surgery 1999, 2003, 2005  . Hand tendon surgery 2012    tendons released in right hand   . Mastectomy, radical 02/13/2009  . Back surgery     screws- lumbar  . Breast surgery 2010    mastectomy - RIGHT  . Colonoscopy with propofol 03/13/2012    Procedure: COLONOSCOPY WITH PROPOFOL;  Surgeon: Charolett Bumpers, MD;  Location: [facility];  Service: Endoscopy;  Laterality: N/A;  . Esophagogastroduodenoscopy 03/13/2012    Procedure: ESOPHAGOGASTRODUODENOSCOPY (EGD);  Surgeon: Charolett Bumpers, MD;  Location: [facility];  Service:  Endoscopy;  Laterality: N/A;  Reflux, Hiatal Hernia    Family History  Problem Relation Age of Onset  . Arthritis Mother     rhuematoid  . Heart disease Mother     Atrial fib  . Heart disease Father   . Heart disease Sister     Atrial fib    Social History History  Substance Use Topics  . Smoking status: Current Every Day Smoker -- 0.5 packs/day    Types: Cigarettes  . Smokeless tobacco: Never Used     Comment: 1/2 pk per day  . Alcohol Use: No    Allergies  Allergen Reactions  . Carbamazepine Anaphylaxis  . Other     STATES HAS SHORTNESS OF BREATH WITH GENERIC DRUGS     ALSO RASH AND ITCHING  . Pseudoephedrine Hcl Er Other (See Comments)    SEIZURES.  . Valium Other (See Comments)    Hallucinations, spasticity, hyper-relfexive   . Oxycodone Nausea And Vomiting and Rash  . Prednisone Rash and Other (See Comments)    Prescribing MD told patient he thinks dose (dose pack) was "just too high." 03/09/11   STATES HAS TAKEN SMALLER DOSE AND STILL HAD RASH AND JITTERINESS  . Ibuprofen Swelling  . Percocet (Oxycodone-Acetaminophen) Nausea And Vomiting  . Iodine Rash    SWELLING -  TOPICAL PAINT  . Latex Dermatitis and Rash  Current Outpatient Prescriptions  Medication Sig Dispense Refill  . aspirin-acetaminophen-caffeine (EXCEDRIN EXTRA STRENGTH) 250-250-65 MG per tablet Take 1 tablet by mouth every 6 (six) hours as needed. Pain      . lamoTRIgine (LAMICTAL) 100 MG tablet Take 50 mg by mouth 2 (two) times daily. Pt takes a total 250mg  pt takes this and the 200 mg dose.      . lamoTRIgine (LAMICTAL) 200 MG tablet Take 200 mg by mouth 2 (two) times daily. Pt takes with the half tablet of 100=250mg  total      . metoprolol succinate (TOPROL-XL) 100 MG 24 hr tablet Take 150 mg by mouth every morning. Take with or immediately following a meal.      . phenytoin (DILANTIN) 100 MG ER capsule Take 100 mg by mouth 4 (four) times daily.         Review of Systems Review of Systems   Constitutional: Negative for fever, chills and unexpected weight change.  HENT: Negative for hearing loss, congestion, sore throat, trouble swallowing and voice change.   Eyes: Negative for visual disturbance.  Respiratory: Negative for cough and wheezing.   Cardiovascular: Negative for chest pain, palpitations and leg swelling.  Gastrointestinal: Negative for nausea, vomiting, abdominal pain, diarrhea, constipation, blood in stool, abdominal distention and anal bleeding.  Genitourinary: Negative for hematuria, vaginal bleeding and difficulty urinating.  Musculoskeletal: Negative for arthralgias.  Skin: Negative for rash and wound.  Neurological: Negative for seizures, syncope and headaches.  Hematological: Negative for adenopathy. Does not bruise/bleed easily.  Psychiatric/Behavioral: Negative for confusion. The patient is nervous/anxious.     Blood pressure 132/94, pulse 76, temperature 98.5 F (36.9 C), temperature source Temporal, resp. rate 16, height 5\' 2"  (1.575 m), weight 138 lb 3.2 oz (62.687 kg), last menstrual period 11/03/1993.  Physical Exam Physical Exam  Constitutional: She is oriented to person, place, and time. She appears well-developed and well-nourished. No distress.  HENT:  Head: Normocephalic and atraumatic.  Nose: Nose normal.  Mouth/Throat: No oropharyngeal exudate.  Eyes: Conjunctivae normal and EOM are normal. Pupils are equal, round, and reactive to light. Left eye exhibits no discharge. No scleral icterus.  Neck: Neck supple. No JVD present. No tracheal deviation present. No thyromegaly present.  Cardiovascular: Normal rate, regular rhythm, normal heart sounds and intact distal pulses.   No murmur heard. Pulmonary/Chest: Effort normal and breath sounds normal. No respiratory distress. She has no wheezes. She has no rales. She exhibits no tenderness.       Right mastectomy wound well healed. Tissues soft. No nodules or ulceration. No axillary adenopathy. No  arm swelling or sensory deficit. Full range of motion. Left breast soft, small and somewhat atrophic, no palpable mass, no skin changes, no axillary adenopathy.  Abdominal: Soft. Bowel sounds are normal. She exhibits no distension and no mass. There is no tenderness. There is no rebound and no guarding.  Musculoskeletal: She exhibits no edema and no tenderness.  Lymphadenopathy:    She has no cervical adenopathy.  Neurological: She is alert and oriented to person, place, and time. She exhibits normal muscle tone. Coordination normal.  Skin: Skin is warm. No rash noted. She is not diaphoretic. No erythema. No pallor.  Psychiatric: She has a normal mood and affect. Her behavior is normal. Judgment and thought content normal.    Data Reviewed My old records. Cancer center records. Recent mammograms.  Assessment    Invasive duct carcinoma right breast, athletic stage T1c., N0, receptor positive, HER-2-negative  No evidence  of local recurrence 3 years following right total mastectomy and sentinel node biopsy.  Depression    Plan    Continue medical oncology followup with Dr. Glenford Peers  Return to see me in one year after mammograms.       Angelia Mould. Derrell Lolling, M.D., [facility] General and Minimally invasive Surgery Breast and Colorectal Surgery Office:   [phone] Pager:   [phone]  04/16/2012, 2:47 PM

## 2012-04-16 NOTE — Patient Instructions (Signed)
Examination of your right mastectomy site on the chest wall, and examination of the left breast is normal. The lymph node areas are also normal. The recent mammogram of the left breast is also normal. There is no evidence of cancer.  Keep your appointments with Dr. Glenford Peers.  Return to see Dr. Derrell Lolling in one year after you get the mammogram of your left breast.

## 2012-04-18 DIAGNOSIS — F32A Depression, unspecified: Secondary | ICD-10-CM

## 2012-04-18 HISTORY — DX: Depression, unspecified: F32.A

## 2012-04-19 ENCOUNTER — Other Ambulatory Visit (HOSPITAL_COMMUNITY): Payer: Self-pay | Admitting: Cardiovascular Disease

## 2012-04-19 DIAGNOSIS — R002 Palpitations: Secondary | ICD-10-CM

## 2012-04-19 DIAGNOSIS — T65291A Toxic effect of other tobacco and nicotine, accidental (unintentional), initial encounter: Secondary | ICD-10-CM | POA: Diagnosis not present

## 2012-04-27 ENCOUNTER — Ambulatory Visit (INDEPENDENT_AMBULATORY_CARE_PROVIDER_SITE_OTHER): Payer: Medicare Other | Admitting: Psychology

## 2012-04-27 DIAGNOSIS — F331 Major depressive disorder, recurrent, moderate: Secondary | ICD-10-CM

## 2012-05-07 ENCOUNTER — Ambulatory Visit: Payer: Medicare Other | Admitting: Psychology

## 2012-05-08 ENCOUNTER — Ambulatory Visit: Payer: Medicare Other | Admitting: Psychology

## 2012-05-11 ENCOUNTER — Ambulatory Visit (INDEPENDENT_AMBULATORY_CARE_PROVIDER_SITE_OTHER): Payer: Medicare Other | Admitting: Psychology

## 2012-05-11 DIAGNOSIS — F331 Major depressive disorder, recurrent, moderate: Secondary | ICD-10-CM

## 2012-05-18 ENCOUNTER — Ambulatory Visit: Payer: Medicare Other | Admitting: Psychology

## 2012-05-25 ENCOUNTER — Ambulatory Visit: Payer: Medicare Other | Admitting: Psychology

## 2012-06-02 ENCOUNTER — Other Ambulatory Visit: Payer: Self-pay

## 2012-06-06 DIAGNOSIS — M47812 Spondylosis without myelopathy or radiculopathy, cervical region: Secondary | ICD-10-CM | POA: Diagnosis not present

## 2012-06-06 DIAGNOSIS — M47817 Spondylosis without myelopathy or radiculopathy, lumbosacral region: Secondary | ICD-10-CM | POA: Diagnosis not present

## 2012-06-14 ENCOUNTER — Ambulatory Visit (INDEPENDENT_AMBULATORY_CARE_PROVIDER_SITE_OTHER): Payer: Medicare Other | Admitting: Psychology

## 2012-06-14 DIAGNOSIS — F331 Major depressive disorder, recurrent, moderate: Secondary | ICD-10-CM

## 2012-06-29 ENCOUNTER — Ambulatory Visit (INDEPENDENT_AMBULATORY_CARE_PROVIDER_SITE_OTHER): Payer: Medicare Other | Admitting: Psychology

## 2012-06-29 DIAGNOSIS — F331 Major depressive disorder, recurrent, moderate: Secondary | ICD-10-CM

## 2012-07-17 DIAGNOSIS — F3289 Other specified depressive episodes: Secondary | ICD-10-CM | POA: Diagnosis not present

## 2012-07-18 ENCOUNTER — Ambulatory Visit: Payer: Medicare Other | Admitting: Psychology

## 2012-07-26 ENCOUNTER — Ambulatory Visit (INDEPENDENT_AMBULATORY_CARE_PROVIDER_SITE_OTHER): Payer: Medicare Other | Admitting: Psychology

## 2012-07-26 DIAGNOSIS — F331 Major depressive disorder, recurrent, moderate: Secondary | ICD-10-CM | POA: Diagnosis not present

## 2012-08-02 ENCOUNTER — Ambulatory Visit (INDEPENDENT_AMBULATORY_CARE_PROVIDER_SITE_OTHER): Payer: Medicare Other | Admitting: Psychology

## 2012-08-02 DIAGNOSIS — F331 Major depressive disorder, recurrent, moderate: Secondary | ICD-10-CM

## 2012-08-14 ENCOUNTER — Other Ambulatory Visit: Payer: Self-pay

## 2012-08-14 MED ORDER — LAMOTRIGINE 200 MG PO TABS: 200.0000 mg | ORAL_TABLET | Freq: Two times a day (BID) | ORAL | Status: DC

## 2012-08-14 MED ORDER — LAMOTRIGINE 100 MG PO TABS: 50.0000 mg | ORAL_TABLET | Freq: Two times a day (BID) | ORAL | Status: DC

## 2012-08-14 NOTE — Telephone Encounter (Signed)
Former [facility] patient.  Has not been assigned new provider.  Auth refills via WID  

## 2012-08-20 DIAGNOSIS — F3289 Other specified depressive episodes: Secondary | ICD-10-CM | POA: Diagnosis not present

## 2012-08-21 ENCOUNTER — Ambulatory Visit (INDEPENDENT_AMBULATORY_CARE_PROVIDER_SITE_OTHER): Payer: Medicare Other | Admitting: Psychology

## 2012-08-21 DIAGNOSIS — F331 Major depressive disorder, recurrent, moderate: Secondary | ICD-10-CM

## 2012-09-06 ENCOUNTER — Ambulatory Visit: Payer: Medicare Other | Admitting: Psychology

## 2012-09-17 ENCOUNTER — Ambulatory Visit (INDEPENDENT_AMBULATORY_CARE_PROVIDER_SITE_OTHER): Payer: Medicare Other | Admitting: Psychology

## 2012-09-17 DIAGNOSIS — F331 Major depressive disorder, recurrent, moderate: Secondary | ICD-10-CM | POA: Diagnosis not present

## 2012-09-25 DIAGNOSIS — M47812 Spondylosis without myelopathy or radiculopathy, cervical region: Secondary | ICD-10-CM | POA: Diagnosis not present

## 2012-09-25 DIAGNOSIS — M47817 Spondylosis without myelopathy or radiculopathy, lumbosacral region: Secondary | ICD-10-CM | POA: Diagnosis not present

## 2012-09-26 ENCOUNTER — Ambulatory Visit (HOSPITAL_COMMUNITY): Payer: Medicare Other | Admitting: Oncology

## 2012-09-26 NOTE — Progress Notes (Signed)
Wendy Housekeeper, MD [address] [place] [address]  Cancer of right breast - Plan: CBC with Differential, Basic metabolic panel, BRINTELLIX 10 MG TABS, CBC with Differential, Basic metabolic panel, Vitamin B12, Vitamin D 25 hydroxy  Low vitamin B12 level - Plan: CBC with Differential, Basic metabolic panel, BRINTELLIX 10 MG TABS, CBC with Differential, Basic metabolic panel, Vitamin B12, Vitamin D 25 hydroxy  CURRENT THERAPY: Surveillance  INTERVAL HISTORY: Wendy Lopez 52 y.o. female returns for  regular  visit for followup of breast cancer, stage I, ER +99%, PR +54%, Ki-67 marker 15%, HER-2/neu nonamplified. Oncotype score was intermediate and she was offered chemotherapy. All 3 nodes were negative. She took 3 cycles of Taxotere/Cytoxan but stopped due to intolerance, she was placed on Arimidex which she stopped due to intolerance.  The patient reports that she was recently diagnosed with depression which he is undergoing treatment for her. She has been following up with Dr. Sandria Manly initially diagnosed her and since then, she is seeing a clinical psychologist.  She reports that she does not feel depressed and is surprised by this diagnosis.  She reports that she was diagnosed with vitamin B12 deficiency and also vitamin D deficiency. This is not documented in [facility]. She reports this has not been checked in some time and she requested to be checked today. I will oblige and send a laboratory results to her primary care physician and defer treatment to him. We will perform a CBC differential and basic metabolic panel for her history of breast cancer diagnosis.  Oncologically, the patient denies any complaints and ROS questioning is negative.  The patient is reminded her next screening mammogram is to be performed in December 2014.  She is back to smoking cigarettes and I provided her with smoking cessation education.  Past Medical History  Diagnosis Date  . Spinal fusion  failure     3  . Mitral valve prolapse   . Arthritis   . Breast cancer 02/13/2009  . Dysrhythmia     palpitations  . H/O hiatal hernia   . Epilepsy     last seizure 1990's  . PONV (postoperative nausea and vomiting)   . Osteopenia   . Depression   . Cancer of right breast 04/16/2012  . Depression 2014    has Cancer of right breast on her problem list.     is allergic to carbamazepine; other; pseudoephedrine hcl er; valium; oxycodone; prednisone; ibuprofen; percocet; iodine; and latex.  Wendy Lopez does not currently have medications on file.  Past Surgical History  Procedure Laterality Date  . Tonsillectomy    . Spine surgery  1999, 2003, 2005  . Hand tendon surgery  2012    tendons released in right hand   . Mastectomy, radical  02/13/2009  . Back surgery      screws- lumbar  . Breast surgery  2010    mastectomy - RIGHT  . Colonoscopy with propofol  03/13/2012    Procedure: COLONOSCOPY WITH PROPOFOL;  Surgeon: Charolett Bumpers, MD;  Location: [facility];  Service: Endoscopy;  Laterality: N/A;  . Esophagogastroduodenoscopy  03/13/2012    Procedure: ESOPHAGOGASTRODUODENOSCOPY (EGD);  Surgeon: Charolett Bumpers, MD;  Location: [facility];  Service: Endoscopy;  Laterality: N/A;  Reflux, Hiatal Hernia    Denies any headaches, dizziness, double vision, fevers, chills, night sweats, nausea, vomiting, diarrhea, constipation, chest pain, heart palpitations, shortness of breath, blood in stool, black tarry stool, urinary pain, urinary burning, urinary frequency, hematuria.  PHYSICAL EXAMINATION  ECOG PERFORMANCE STATUS: 0 - Asymptomatic  Filed Vitals:   09/27/12 1401  BP: 103/67  Pulse: 69  Temp: 98.2 F (36.8 C)  Resp: 18    GENERAL:alert, no distress, well nourished, well developed, comfortable, cooperative and smiling SKIN: skin color, texture, turgor are normal, no rashes or significant lesions HEAD: Normocephalic, No masses, lesions, tenderness or  abnormalities EYES: normal, EOMI, Conjunctiva are pink and non-injected EARS: External ears normal OROPHARYNX:mucous membranes are moist  NECK: supple, no adenopathy, thyroid normal size, non-tender, without nodularity, no stridor, non-tender, trachea midline LYMPH:  no palpable lymphadenopathy, no hepatosplenomegaly BREAST:left breast normal without mass, skin or nipple changes or axillary nodes, right post-mastectomy site well healed and free of suspicious changes LUNGS: clear to auscultation and percussion HEART: regular rate & rhythm, no murmurs, no gallops, S1 normal and S2 normal ABDOMEN:abdomen soft, non-tender, normal bowel sounds, no masses or organomegaly and no hepatosplenomegaly BACK: Back symmetric, no curvature., No CVA tenderness EXTREMITIES:less then 2 second capillary refill, no joint deformities, effusion, or inflammation, no edema, no skin discoloration, no clubbing, no cyanosis  NEURO: alert & oriented x 3 with fluent speech, no focal motor/sensory deficits, gait normal   LABORATORY DATA: CBC    Component Value Date/Time   WBC 7.1 03/26/2012 1520   WBC 6.2 12/28/2009 0941   RBC 5.14* 03/26/2012 1520   RBC 5.07 12/28/2009 0941   HGB 16.3* 03/26/2012 1520   HGB 16.4* 12/28/2009 0941   HCT 45.7 03/26/2012 1520   HCT 47.1* 12/28/2009 0941   PLT 179 03/26/2012 1520   PLT 170 12/28/2009 0941   MCV 88.9 03/26/2012 1520   MCV 92.8 12/28/2009 0941   MCH 31.7 03/26/2012 1520   MCH 32.3 12/28/2009 0941   MCHC 35.7 03/26/2012 1520   MCHC 34.8 12/28/2009 0941   RDW 13.1 03/26/2012 1520   RDW 12.8 12/28/2009 0941   LYMPHSABS 3.2 03/26/2012 1520   LYMPHSABS 2.0 12/28/2009 0941   MONOABS 0.6 03/26/2012 1520   MONOABS 0.6 12/28/2009 0941   EOSABS 0.2 03/26/2012 1520   EOSABS 0.2 12/28/2009 0941   BASOSABS 0.1 03/26/2012 1520   BASOSABS 0.1 12/28/2009 0941      Chemistry      Component Value Date/Time   NA 140 03/26/2012 1520   K 4.1 03/26/2012 1520   CL 104 03/26/2012 1520   CO2 28  03/26/2012 1520   BUN 9 03/26/2012 1520   CREATININE 0.74 03/26/2012 1520      Component Value Date/Time   CALCIUM 9.2 03/26/2012 1520   ALKPHOS 121* 03/26/2012 1520   AST 23 03/26/2012 1520   ALT 27 03/26/2012 1520   BILITOT 0.2* 03/26/2012 1520        ASSESSMENT:  1. Breast cancer, stage I, ER +99%, PR +54%, Ki-67 marker 15%, HER-2/neu nonamplified. Oncotype score was intermediate and she was offered chemotherapy. All 3 nodes were negative. She took 3 cycles of Taxotere/Cytoxan but stopped due to intolerance, she was placed on Arimidex which she stopped due to intolerance. 2. H/O seizure disorder 3. Memory loss, followed by Dr. Sandria Manly.  Patient Active Problem List   Diagnosis Date Noted  . Cancer of right breast 04/16/2012     PLAN:  1. I personally reviewed and went over laboratory results with the patient. 2. Lab work today: CBC diff, BMET, Vitamin B12, Vit D level 3. Will fax Vit B12 and Vit D level to PCP and defer treatment of this to him.  4. Smoking cessation  education provided 5. Next screening mammogram due in Dec 2014 6. Return in 6 months for follow-up  All questions were answered. The patient knows to call the clinic with any problems, questions or concerns. We can certainly see the patient much sooner if necessary.  Patient and plan discussed with Dr. Erline Hau and he is in agreement with the aforementioned.   KEFALAS,THOMAS

## 2012-09-27 ENCOUNTER — Encounter (HOSPITAL_COMMUNITY): Payer: Medicare Other | Attending: Oncology | Admitting: Oncology

## 2012-09-27 ENCOUNTER — Encounter (HOSPITAL_COMMUNITY): Payer: Self-pay | Admitting: Oncology

## 2012-09-27 VITALS — BP 103/67 | HR 69 | Temp 98.2°F | Resp 18 | Wt 139.0 lb

## 2012-09-27 DIAGNOSIS — F17200 Nicotine dependence, unspecified, uncomplicated: Secondary | ICD-10-CM | POA: Diagnosis not present

## 2012-09-27 DIAGNOSIS — R413 Other amnesia: Secondary | ICD-10-CM

## 2012-09-27 DIAGNOSIS — Z853 Personal history of malignant neoplasm of breast: Secondary | ICD-10-CM | POA: Insufficient documentation

## 2012-09-27 DIAGNOSIS — Z09 Encounter for follow-up examination after completed treatment for conditions other than malignant neoplasm: Secondary | ICD-10-CM | POA: Diagnosis not present

## 2012-09-27 DIAGNOSIS — C50911 Malignant neoplasm of unspecified site of right female breast: Secondary | ICD-10-CM

## 2012-09-27 DIAGNOSIS — C50919 Malignant neoplasm of unspecified site of unspecified female breast: Secondary | ICD-10-CM

## 2012-09-27 DIAGNOSIS — E538 Deficiency of other specified B group vitamins: Secondary | ICD-10-CM | POA: Diagnosis not present

## 2012-09-27 DIAGNOSIS — E559 Vitamin D deficiency, unspecified: Secondary | ICD-10-CM | POA: Diagnosis not present

## 2012-09-27 LAB — CBC WITH DIFFERENTIAL/PLATELET
Basophils Absolute: 0.1 10*3/uL (ref 0.0–0.1)
Basophils Relative: 1 % (ref 0–1)
Eosinophils Absolute: 0.2 10*3/uL (ref 0.0–0.7)
Eosinophils Relative: 3 % (ref 0–5)
HCT: 46.1 % — ABNORMAL HIGH (ref 36.0–46.0)
Hemoglobin: 16.2 g/dL — ABNORMAL HIGH (ref 12.0–15.0)
Lymphocytes Relative: 50 % — ABNORMAL HIGH (ref 12–46)
Lymphs Abs: 3.1 10*3/uL (ref 0.7–4.0)
MCH: 31.3 pg (ref 26.0–34.0)
MCHC: 35.1 g/dL (ref 30.0–36.0)
MCV: 89 fL (ref 78.0–100.0)
Monocytes Absolute: 0.6 10*3/uL (ref 0.1–1.0)
Monocytes Relative: 9 % (ref 3–12)
Neutro Abs: 2.2 10*3/uL (ref 1.7–7.7)
Neutrophils Relative %: 36 % — ABNORMAL LOW (ref 43–77)
Platelets: 176 10*3/uL (ref 150–400)
RBC: 5.18 MIL/uL — ABNORMAL HIGH (ref 3.87–5.11)
RDW: 13 % (ref 11.5–15.5)
WBC: 6.1 10*3/uL (ref 4.0–10.5)

## 2012-09-27 LAB — BASIC METABOLIC PANEL
BUN: 11 mg/dL (ref 6–23)
CO2: 27 mEq/L (ref 19–32)
Calcium: 9 mg/dL (ref 8.4–10.5)
Chloride: 103 mEq/L (ref 96–112)
Creatinine, Ser: 0.77 mg/dL (ref 0.50–1.10)
GFR calc Af Amer: >90 mL/min (ref >90)
GFR calc non Af Amer: >90 mL/min (ref >90)
Glucose, Bld: 80 mg/dL (ref 70–99)
Potassium: 4 mEq/L (ref 3.5–5.1)
Sodium: 140 mEq/L (ref 135–145)

## 2012-09-27 NOTE — Progress Notes (Signed)
Wendy Lopez presented for labwork. Labs per MD order drawn via Peripheral Line 25 gauge needle inserted in lt ac.  Good blood return present. Procedure without incident.  Needle removed intact. Patient tolerated procedure well.

## 2012-09-27 NOTE — Patient Instructions (Addendum)
.  [facility] Discharge Instructions  RECOMMENDATIONS MADE BY THE CONSULTANT AND ANY TEST RESULTS WILL BE SENT TO YOUR REFERRING PHYSICIAN.  EXAM FINDINGS BY THE PHYSICIAN TODAY AND SIGNS OR SYMPTOMS TO REPORT TO CLINIC OR PRIMARY PHYSICIAN: we will do labs today    SPECIAL INSTRUCTIONS/FOLLOW-UP: See [place] back in 6 months  Thank you for choosing [facility] to provide your oncology and hematology care.  To afford each patient quality time with our providers, please arrive at least 15 minutes before your scheduled appointment time.  With your help, our goal is to use those 15 minutes to complete the necessary work-up to ensure our physicians have the information they need to help with your evaluation and healthcare recommendations.    Effective January 1st, 2014, we ask that you re-schedule your appointment with our physicians should you arrive 10 or more minutes late for your appointment.  We strive to give you quality time with our providers, and arriving late affects you and other patients whose appointments are after yours.    Again, thank you for choosing [facility].  Our hope is that these requests will decrease the amount of time that you wait before being seen by our physicians.       _____________________________________________________________  Should you have questions after your visit to [facility], please contact our office at [phone] between the hours of 8:30 a.m. and 5:00 p.m.  Voicemails left after 4:30 p.m. will not be returned until the following business day.  For prescription refill requests, have your pharmacy contact our office with your prescription refill request.

## 2012-09-28 ENCOUNTER — Ambulatory Visit (HOSPITAL_COMMUNITY): Payer: Medicare Other

## 2012-09-28 LAB — VITAMIN B12: Vitamin B-12: 425 pg/mL (ref 211–911)

## 2012-09-28 LAB — VITAMIN D 25 HYDROXY (VIT D DEFICIENCY, FRACTURES): Vit D, 25-Hydroxy: 44 ng/mL (ref 30–89)

## 2012-10-02 ENCOUNTER — Ambulatory Visit (INDEPENDENT_AMBULATORY_CARE_PROVIDER_SITE_OTHER): Payer: Medicare Other | Admitting: Psychology

## 2012-10-02 DIAGNOSIS — F331 Major depressive disorder, recurrent, moderate: Secondary | ICD-10-CM

## 2012-10-05 ENCOUNTER — Ambulatory Visit (HOSPITAL_COMMUNITY): Payer: Medicare Other

## 2012-10-09 ENCOUNTER — Other Ambulatory Visit (HOSPITAL_COMMUNITY): Payer: Self-pay | Admitting: Cardiovascular Disease

## 2012-10-09 NOTE — Telephone Encounter (Signed)
Rx was sent to pharmacy electronically. 

## 2012-10-16 ENCOUNTER — Ambulatory Visit (HOSPITAL_COMMUNITY)
Admission: RE | Admit: 2012-10-16 | Discharge: 2012-10-16 | Disposition: A | Discharge status: Home / Self Care | Payer: Medicare Other | Source: Ambulatory Visit | Attending: Cardiovascular Disease | Admitting: Cardiovascular Disease

## 2012-10-16 ENCOUNTER — Telehealth: Payer: Self-pay | Admitting: Neurology

## 2012-10-16 ENCOUNTER — Ambulatory Visit (INDEPENDENT_AMBULATORY_CARE_PROVIDER_SITE_OTHER): Payer: Medicare Other | Admitting: Psychology

## 2012-10-16 DIAGNOSIS — R002 Palpitations: Secondary | ICD-10-CM | POA: Diagnosis not present

## 2012-10-16 DIAGNOSIS — I369 Nonrheumatic tricuspid valve disorder, unspecified: Secondary | ICD-10-CM | POA: Diagnosis not present

## 2012-10-16 DIAGNOSIS — I319 Disease of pericardium, unspecified: Secondary | ICD-10-CM | POA: Diagnosis not present

## 2012-10-16 DIAGNOSIS — F331 Major depressive disorder, recurrent, moderate: Secondary | ICD-10-CM | POA: Diagnosis not present

## 2012-10-16 DIAGNOSIS — I079 Rheumatic tricuspid valve disease, unspecified: Secondary | ICD-10-CM | POA: Insufficient documentation

## 2012-10-16 DIAGNOSIS — I059 Rheumatic mitral valve disease, unspecified: Secondary | ICD-10-CM | POA: Diagnosis not present

## 2012-10-16 NOTE — Progress Notes (Signed)
[facility]   2D echo completed 10/16/2012.   Cindy Bodey Frizell, RDCS  

## 2012-10-18 DIAGNOSIS — F3289 Other specified depressive episodes: Secondary | ICD-10-CM | POA: Diagnosis not present

## 2012-10-22 DIAGNOSIS — H524 Presbyopia: Secondary | ICD-10-CM | POA: Diagnosis not present

## 2012-10-22 DIAGNOSIS — H5310 Unspecified subjective visual disturbances: Secondary | ICD-10-CM | POA: Diagnosis not present

## 2012-10-22 DIAGNOSIS — H52209 Unspecified astigmatism, unspecified eye: Secondary | ICD-10-CM | POA: Diagnosis not present

## 2012-10-28 ENCOUNTER — Encounter: Payer: Self-pay | Admitting: *Deleted

## 2012-10-29 ENCOUNTER — Ambulatory Visit: Payer: Medicare Other | Admitting: Cardiovascular Disease

## 2012-10-29 ENCOUNTER — Encounter: Payer: Self-pay | Admitting: Cardiovascular Disease

## 2012-10-30 ENCOUNTER — Other Ambulatory Visit: Payer: Self-pay

## 2012-10-30 ENCOUNTER — Telehealth: Payer: Self-pay | Admitting: Neurology

## 2012-10-30 MED ORDER — PHENYTOIN SODIUM EXTENDED 100 MG PO CAPS: 100.0000 mg | ORAL_CAPSULE | Freq: Four times a day (QID) | ORAL | Status: DC

## 2012-10-30 NOTE — Telephone Encounter (Signed)
Former [facility] patient, has not been assigned new provider.  Auth refills via WID.  Sending message to Triage to assign new MD.

## 2012-11-02 ENCOUNTER — Ambulatory Visit: Payer: Self-pay | Admitting: Nurse Practitioner

## 2012-11-05 ENCOUNTER — Other Ambulatory Visit (HOSPITAL_COMMUNITY): Payer: Self-pay | Admitting: Oncology

## 2012-11-05 ENCOUNTER — Ambulatory Visit (INDEPENDENT_AMBULATORY_CARE_PROVIDER_SITE_OTHER): Payer: Medicare Other | Admitting: Psychology

## 2012-11-05 DIAGNOSIS — F331 Major depressive disorder, recurrent, moderate: Secondary | ICD-10-CM

## 2012-11-06 ENCOUNTER — Encounter: Payer: Self-pay | Admitting: *Deleted

## 2012-11-09 ENCOUNTER — Encounter: Payer: Self-pay | Admitting: Cardiovascular Disease

## 2012-11-09 ENCOUNTER — Ambulatory Visit (INDEPENDENT_AMBULATORY_CARE_PROVIDER_SITE_OTHER): Payer: Medicare Other | Admitting: Cardiovascular Disease

## 2012-11-09 VITALS — BP 126/80 | HR 74 | Ht 62.0 in | Wt 141.0 lb

## 2012-11-09 DIAGNOSIS — I10 Essential (primary) hypertension: Secondary | ICD-10-CM | POA: Diagnosis not present

## 2012-11-09 DIAGNOSIS — R9431 Abnormal electrocardiogram [ECG] [EKG]: Secondary | ICD-10-CM | POA: Diagnosis not present

## 2012-11-09 DIAGNOSIS — F17200 Nicotine dependence, unspecified, uncomplicated: Secondary | ICD-10-CM

## 2012-11-09 DIAGNOSIS — M519 Unspecified thoracic, thoracolumbar and lumbosacral intervertebral disc disorder: Secondary | ICD-10-CM

## 2012-11-09 DIAGNOSIS — Z720 Tobacco use: Secondary | ICD-10-CM

## 2012-11-09 NOTE — Patient Instructions (Addendum)
Your physician recommends that you schedule a follow-up appointment in: 6 weeks.  Your physician has requested that you have a lexiscan myoview. For further information please visit [email or web address]. Please follow instruction sheet, as given.  

## 2012-11-19 DIAGNOSIS — F3289 Other specified depressive episodes: Secondary | ICD-10-CM | POA: Diagnosis not present

## 2012-11-20 ENCOUNTER — Ambulatory Visit (HOSPITAL_COMMUNITY)
Admission: RE | Admit: 2012-11-20 | Discharge: 2012-11-20 | Disposition: A | Discharge status: Home / Self Care | Payer: Medicare Other | Source: Ambulatory Visit | Attending: Cardiovascular Disease | Admitting: Cardiovascular Disease

## 2012-11-20 DIAGNOSIS — F17200 Nicotine dependence, unspecified, uncomplicated: Secondary | ICD-10-CM | POA: Diagnosis not present

## 2012-11-20 DIAGNOSIS — Z8249 Family history of ischemic heart disease and other diseases of the circulatory system: Secondary | ICD-10-CM | POA: Diagnosis not present

## 2012-11-20 DIAGNOSIS — R002 Palpitations: Secondary | ICD-10-CM | POA: Insufficient documentation

## 2012-11-20 DIAGNOSIS — I10 Essential (primary) hypertension: Secondary | ICD-10-CM | POA: Diagnosis not present

## 2012-11-20 DIAGNOSIS — R0602 Shortness of breath: Secondary | ICD-10-CM | POA: Insufficient documentation

## 2012-11-20 DIAGNOSIS — R9431 Abnormal electrocardiogram [ECG] [EKG]: Secondary | ICD-10-CM | POA: Insufficient documentation

## 2012-11-20 MED ORDER — REGADENOSON 0.4 MG/5ML IV SOLN
0.4000 mg | Freq: Once | INTRAVENOUS | Status: AC
  Administered 2012-11-20: 0.4 mg via INTRAVENOUS

## 2012-11-20 MED ORDER — TECHNETIUM TC 99M SESTAMIBI GENERIC - CARDIOLITE
10.0000 | Freq: Once | INTRAVENOUS | Status: AC | PRN
  Administered 2012-11-20: 10 via INTRAVENOUS

## 2012-11-20 MED ORDER — AMINOPHYLLINE 25 MG/ML IV SOLN
100.0000 mg | Freq: Once | INTRAVENOUS | Status: AC
  Administered 2012-11-20: 100 mg via INTRAVENOUS

## 2012-11-20 MED ORDER — TECHNETIUM TC 99M SESTAMIBI GENERIC - CARDIOLITE
30.0000 | Freq: Once | INTRAVENOUS | Status: AC | PRN
  Administered 2012-11-20: 30 via INTRAVENOUS

## 2012-11-20 NOTE — Procedures (Addendum)
Wendy Lopez [facility] [address] [place] [address] [phone]  Cardiology Nuclear Med Study  TEONNA COONAN is a 52 y.o. female     MRN : [id number]     DOB: [birth year]  Procedure Date: 11/20/2012  Nuclear Med Background Indication for Stress Test:  Evaluation for Ischemia and Abnormal EKG History:  MVP;PALPITATIONS Cardiac Risk Factors: Family History - CAD, Hypertension and Smoker  Symptoms:  Palpitations and SOB   Nuclear Pre-Procedure Caffeine/Decaff Intake:  7:00pm NPO After: 5:00am   IV Site: L Antecubital  IV 0.9% NS with Angio Cath:  22g  Chest Size (in):  N/A IV Started by: Emmit Pomfret, RN  Height: 5\' 2"  (1.575 m)  Cup Size: B  BMI:  Body mass index is 25.78 kg/(m^2). Weight:  141 lb (63.957 kg)   Tech Comments:  PT IS S/P R MASTECTOMY     Nuclear Med Study 1 or 2 day study: 1 day  Stress Test Type:  Lexiscan  Order Authorizing Provider:  Nicki Guadalajara, MD   Resting Radionuclide: Technetium 22m Sestamibi  Resting Radionuclide Dose: 10.5 mCi   Stress Radionuclide:  Technetium 32m Sestamibi  Stress Radionuclide Dose: 32.0 mCi           Stress Protocol Rest HR: 64 Stress HR:  96  Rest BP:  143/80 Stress BP: 148/84  Exercise Time (min): n/a METS: n/a   Predicted Max HR: 168 bpm % Max HR: 57.14 bpm Rate Pressure Product: 78469  Dose of Adenosine (mg):  n/a Dose of Lexiscan: 0.4 mg  Dose of Atropine (mg): n/a Dose of Dobutamine: n/a cg/kg/min (at max HR)  Stress Test Technologist: Esperanza Sheets, CCT Nuclear Technologist: Koren Shiver, CNMT   Rest Procedure:  Myocardial perfusion imaging was performed at rest 45 minutes following the intravenous administration of Technetium 77m Sestamibi. Stress Procedure:  The patient received IV Lexiscan 0.4 mg over 15-seconds.  Technetium 26m Sestamibi injected at 30-seconds.  There were  significant changes with Lexiscan.  Quantitative spect images were obtained  after a 45 minute delay.  Transient Ischemic Dilatation (Normal <1.22):  1.09 Lung/Heart Ratio (Normal <0.45):  0.31 QGS EDV:  48 ml QGS ESV:  7 ml LV Ejection Fraction: 85%  Signed by Koren Shiver, CNMT  PHYSICIAN INTERPRETATION  Rest ECG: NSR with non-specific ST-T wave changes  Stress ECG: Significant ST abnormalities in inferior & lateral leads that on ETT would be consistent with ischemia.  QPS Raw Data Images:  Mild breast attenuation.  Normal left ventricular size. Stress Images:  There is mild apical thinning with normal uptake in other regions. Rest Images:  There is mild apical thinning with normal uptake in other regions.    Comparison with the stress images reveals no significant change.  Subtraction (SDS):  There is a very small fixed anteriour defect that is most consistent with breast attenuation.  There is no evidence of scar or ischemia.  Impression Exercise Capacity:  Lexiscan with no exercise. BP Response:  Normal blood pressure response. Clinical Symptoms:  Mild chest pain/dyspnea.  ECG Impression:  Significant ST abnormalities that would suggest ischemia. Comparison with Prior Nuclear Study: No significant change from previous study  Overall Impression:  Normal stress nuclear study. and Low risk stress nuclear study with minimal apical breast attenuation.  LV Wall Motion:  NL LV Function; NL Wall Motion   Kaitlan Bin W, MD  11/20/2012 1:07 PM

## 2012-11-23 ENCOUNTER — Ambulatory Visit: Payer: Medicare Other | Admitting: Psychology

## 2012-11-25 ENCOUNTER — Encounter: Payer: Self-pay | Admitting: Cardiovascular Disease

## 2012-11-25 DIAGNOSIS — I10 Essential (primary) hypertension: Secondary | ICD-10-CM | POA: Insufficient documentation

## 2012-11-25 DIAGNOSIS — Z720 Tobacco use: Secondary | ICD-10-CM | POA: Insufficient documentation

## 2012-11-25 DIAGNOSIS — M519 Unspecified thoracic, thoracolumbar and lumbosacral intervertebral disc disorder: Secondary | ICD-10-CM | POA: Insufficient documentation

## 2012-11-25 NOTE — Progress Notes (Signed)
Patient ID: Wendy Lopez, female   DOB: 01-02-1961, 52 y.o.   MRN: [id number]     HPI: Wendy Lopez, is a 52 y.o. female who presents for six-month cardiology evaluation.  Wendy Lopez has a long-standing history of tobacco abuse and started smoking at age 63. In 2009 cardiac catheterization revealed normal coronary arteries. In February 2012 a nuclear perfusion study was done after she experienced episodes of chest pain and shortness of breath and this revealed normal perfusion. At that time, an echo Doppler study reveal mild pulmonary hypertension with estimated RV systolic pressure of 36 mm. Because of issues of recurrent shortness of breath she did undergo cardiopulmonary met test she was found to have a blunted chronotropic response to exercise which is making it difficult for her to improve her endurance and exercise capacity. She does have a history of tachycardia palpitations. She has a history of invasive ductal carcinoma and is status post right mastectomy with sentinel node biopsy and is status post chemotherapy. She has a history of vitamin D insufficiency mild blood pressure elevation. There is also a history of depression.  She recently underwent a 2-1/2 year followup echo Doppler study on 10/16/2012. This revealed an ejection fraction in the range of 60-65%. She did not have wall motion abnormalities and had normal diastolic function. There was evidence for right ventricular hypertrophy with normal RV function. She again had mild pulmonary hypertension with an estimated pressure 39 mm. There is moderate tricuspid regurgitation. Wendy Lopez continues to smoke cigarettes. She does note some indigestion. He also has noticed some left leg weakness and has issues with L4-L5 disc disease. She previously was on a higher dose of present Brintellex but due to tremors she has reduced his dose to 5 mg daily.  Past Medical History  Diagnosis Date  . Spinal fusion failure     3  . Mitral valve  prolapse 06/01/10    echo- EF>55% mild tricupsid regurgitation. There is mild Pulmonary Hypertension.. Right  ventricular systolic pressure iselevated at 30-40 mmHg. LV systolic function is normal.  . Arthritis   . Breast cancer 02/13/2009  . Dysrhythmia     palpitations  . H/O hiatal hernia   . Epilepsy     last seizure 1990's  . PONV (postoperative nausea and vomiting)   . Osteopenia   . Depression   . Cancer of right breast 04/16/2012  . Depression 2014  . SOB (shortness of breath) 10/18/11    met-test    Past Surgical History  Procedure Laterality Date  . Tonsillectomy    . Spine surgery  1999, 2003, 2005  . Hand tendon surgery  2012    tendons released in right hand   . Mastectomy, radical  02/13/2009  . Back surgery      screws- lumbar  . Breast surgery  2010    mastectomy - RIGHT  . Colonoscopy with propofol  03/13/2012    Procedure: COLONOSCOPY WITH PROPOFOL;  Surgeon: Charolett Bumpers, MD;  Location: [facility];  Service: Endoscopy;  Laterality: N/A;  . Esophagogastroduodenoscopy  03/13/2012    Procedure: ESOPHAGOGASTRODUODENOSCOPY (EGD);  Surgeon: Charolett Bumpers, MD;  Location: [facility];  Service: Endoscopy;  Laterality: N/A;  Reflux, Hiatal Hernia    Allergies  Allergen Reactions  . Carbamazepine Anaphylaxis  . Other     STATES HAS SHORTNESS OF BREATH WITH GENERIC DRUGS     ALSO RASH AND ITCHING  . Pseudoephedrine Hcl Er Other (See Comments)  SEIZURES.  . Valium Other (See Comments)    Hallucinations, spasticity, hyper-relfexive   . Oxycodone Nausea And Vomiting and Rash  . Prednisone Rash and Other (See Comments)    Prescribing MD told patient he thinks dose (dose pack) was "just too high." 03/09/11   STATES HAS TAKEN SMALLER DOSE AND STILL HAD RASH AND JITTERINESS  . Ibuprofen Swelling  . Percocet (Oxycodone-Acetaminophen) Nausea And Vomiting  . Iodine Rash    SWELLING -  TOPICAL PAINT  . Latex Dermatitis and Rash    Current Outpatient  Prescriptions  Medication Sig Dispense Refill  . aspirin-acetaminophen-caffeine (EXCEDRIN EXTRA STRENGTH) 250-250-65 MG per tablet Take 1 tablet by mouth every 6 (six) hours as needed. Pain      . BRINTELLIX 10 MG TABS Take 5 mg by mouth daily.       Marland Kitchen lamoTRIgine (LAMICTAL) 100 MG tablet Take 250 mg by mouth 2 (two) times daily.      . phenytoin (DILANTIN) 100 MG ER capsule Take 1 capsule (100 mg total) by mouth 4 (four) times daily.  120 capsule  3  . TOPROL XL 100 MG 24 hr tablet TAKE 1 & 1/2 TABLETS BY MOUTH EVERY DAY  45 tablet  7   No current facility-administered medications for this visit.    History   Social History  . Marital Status: Married    Spouse Name: N/A    Number of Children: N/A  . Years of Education: N/A   Occupational History  . Not on file.   Social History Main Topics  . Smoking status: Current Every Day Smoker -- 0.50 packs/day    Types: Cigarettes  . Smokeless tobacco: Never Used     Comment: 1/2 pk per day  . Alcohol Use: No  . Drug Use: No  . Sexually Active: Not on file   Other Topics Concern  . Not on file   Social History Narrative  . No narrative on file    Social she is married and has one child. She's been smoking since age 45.  ROS is negative for fevers, chills or night sweats. She denies significant weight loss. She did have tremors at high doses of present calyx. She does note some mild shortness of breath with activity. She denies wheezing. She denies recent chest pressure but at times has noticed a sharp fleeting episode of chest sensation. She does note occasional indigestion but denies any abdominal pain.. She denies GU issues. At times she does note some left leg weakness attributed by her lumbar disc disease. She does have a history of breast CA.  Other system review is negative.  PE BP 126/80  Pulse 74  Ht 5\' 2"  (1.575 m)  Wt 141 lb (63.957 kg)  BMI 25.78 kg/m2  LMP 11/03/1993  General: Alert, oriented, no distress.  Skin:  normal turgor, no rashes HEENT: Normocephalic, atraumatic. Pupils round and reactive; sclera anicteric;no lid lag.  Nose without nasal septal hypertrophy Mouth/Parynx benign; Mallinpatti scale 2 Neck: No JVD, no carotid briuts Lungs: decreased BS; no wheezing or rales Heart: RRR, s1 s2 normal 1/6 sem at apex Abdomen: soft, nontender; no hepatosplenomehaly, BS+; abdominal aorta nontender and not dilated by palpation. Pulses 2+ Extremities: no clubbing cyanosis or edema, Homan's sign negative  Neurologic: grossly nonfocal  ECG: Normal sinus rhythm. T-wave abnormalities inferolaterally  LABS:  BMET    Component Value Date/Time   NA 140 09/27/2012 1432   K 4.0 09/27/2012 1432   CL 103 09/27/2012 1432  CO2 27 09/27/2012 1432   GLUCOSE 80 09/27/2012 1432   BUN 11 09/27/2012 1432   CREATININE 0.77 09/27/2012 1432   CALCIUM 9.0 09/27/2012 1432   GFRNONAA >90 09/27/2012 1432   GFRAA >90 09/27/2012 1432     Hepatic Function Panel     Component Value Date/Time   PROT 6.9 03/26/2012 1520   ALBUMIN 3.9 03/26/2012 1520   AST 23 03/26/2012 1520   ALT 27 03/26/2012 1520   ALKPHOS 121* 03/26/2012 1520   BILITOT 0.2* 03/26/2012 1520     CBC    Component Value Date/Time   WBC 6.1 09/27/2012 1432   WBC 6.2 12/28/2009 0941   RBC 5.18* 09/27/2012 1432   RBC 5.07 12/28/2009 0941   HGB 16.2* 09/27/2012 1432   HGB 16.4* 12/28/2009 0941   HCT 46.1* 09/27/2012 1432   HCT 47.1* 12/28/2009 0941   PLT 176 09/27/2012 1432   PLT 170 12/28/2009 0941   MCV 89.0 09/27/2012 1432   MCV 92.8 12/28/2009 0941   MCH 31.3 09/27/2012 1432   MCH 32.3 12/28/2009 0941   MCHC 35.1 09/27/2012 1432   MCHC 34.8 12/28/2009 0941   RDW 13.0 09/27/2012 1432   RDW 12.8 12/28/2009 0941   LYMPHSABS 3.1 09/27/2012 1432   LYMPHSABS 2.0 12/28/2009 0941   MONOABS 0.6 09/27/2012 1432   MONOABS 0.6 12/28/2009 0941   EOSABS 0.2 09/27/2012 1432   EOSABS 0.2 12/28/2009 0941   BASOSABS 0.1 09/27/2012 1432   BASOSABS 0.1 12/28/2009 0941     BNP No  results found for this basename: probnp    Lipid Panel  No results found for this basename: chol, trig, hdl, cholhdl, vldl, ldlcalc     RADIOLOGY: No results found.    ASSESSMENT AND PLAN: Wendy Lopez is a 52 year old female with a long-standing tobacco history of greater than 35 years. She does have T-wave abnormalities. Prior cardiac catheterization has shown normal coronary arteries. A nuclear perfusion study in 2012 showed normal perfusion. A cardiopulmonary that test in July 2013 showed blunted chronotropic response without ischemic changes but she did have reduced functional status with peak O2 at 69% of predicted. She does have mild pulmonary hypertension. I did spend considerable time with her today discussing the importance of complete smoking cessation.  Her echo continues to show normal function without wall motion abnormalities despite her ECG changes. The T-wave changes are more pronounced on today's ECG compared to her prior ECG in January 2004. Her ongoing tobacco history, vague intermittent chest sensation, and more pronounced T-wave abnormalities I am recommending that she undergo a LexiScan Myoview study to make certain these are not ischemia mediated to She is currently taking her Toprol 150 mg daily and denies any episodes of recurrent tachypalpitations. I will see her back in the office in 6 weeks for followup evaluation or sooner if problems arise.     Lennette Bihari, MD, [facility]  11/25/2012 4:47 PM

## 2012-12-02 NOTE — Progress Notes (Signed)
Quick Note:    Results released to mychart.

## 2012-12-03 ENCOUNTER — Ambulatory Visit: Payer: Medicare Other | Admitting: Psychology

## 2012-12-14 ENCOUNTER — Ambulatory Visit (INDEPENDENT_AMBULATORY_CARE_PROVIDER_SITE_OTHER): Payer: Medicare Other | Admitting: Psychology

## 2012-12-14 DIAGNOSIS — F331 Major depressive disorder, recurrent, moderate: Secondary | ICD-10-CM | POA: Diagnosis not present

## 2012-12-18 DIAGNOSIS — F3289 Other specified depressive episodes: Secondary | ICD-10-CM | POA: Diagnosis not present

## 2012-12-21 ENCOUNTER — Ambulatory Visit: Payer: Medicare Other | Admitting: Cardiovascular Disease

## 2012-12-25 ENCOUNTER — Encounter: Payer: Self-pay | Admitting: Cardiovascular Disease

## 2012-12-25 ENCOUNTER — Ambulatory Visit (INDEPENDENT_AMBULATORY_CARE_PROVIDER_SITE_OTHER): Payer: Medicare Other | Admitting: Cardiovascular Disease

## 2012-12-25 VITALS — BP 138/80 | HR 68 | Ht 62.0 in | Wt 141.6 lb

## 2012-12-25 DIAGNOSIS — M519 Unspecified thoracic, thoracolumbar and lumbosacral intervertebral disc disorder: Secondary | ICD-10-CM

## 2012-12-25 DIAGNOSIS — F17200 Nicotine dependence, unspecified, uncomplicated: Secondary | ICD-10-CM | POA: Diagnosis not present

## 2012-12-25 DIAGNOSIS — Z720 Tobacco use: Secondary | ICD-10-CM

## 2012-12-25 DIAGNOSIS — C50919 Malignant neoplasm of unspecified site of unspecified female breast: Secondary | ICD-10-CM

## 2012-12-25 DIAGNOSIS — I10 Essential (primary) hypertension: Secondary | ICD-10-CM | POA: Diagnosis not present

## 2012-12-25 DIAGNOSIS — C50911 Malignant neoplasm of unspecified site of right female breast: Secondary | ICD-10-CM

## 2012-12-25 NOTE — Progress Notes (Signed)
Patient ID: Wendy Lopez, female   DOB: [birth year], 52 y.o.   MRN: [id number]     HPI: Wendy Lopez, is a 52 y.o. female who presents for cardiology evaluation and her recent nuclear perfusion study.  Wendy Lopez has a long-standing history of tobacco abuse and started smoking at age 75. In 2009 cardiac catheterization revealed normal coronary arteries. In February 2012 a nuclear perfusion study was done after she experienced episodes of chest pain and shortness of breath and this revealed normal perfusion. At that time, an echo Doppler study reveal mild pulmonary hypertension with estimated RV systolic pressure of 36 mm. Because of issues of recurrent shortness of breath she did undergo cardiopulmonary met test she was found to have a blunted chronotropic response to exercise which is making it difficult for her to improve her endurance and exercise capacity. She does have a history of tachycardia palpitations. She has a history of invasive ductal carcinoma and is status post right mastectomy with sentinel node biopsy and is status post chemotherapy. She has a history of vitamin D insufficiency mild blood pressure elevation. There is also a history of depression.  She recently underwent an echo Doppler study on 10/16/2012. This revealed an ejection fraction in the range of 60-65%. She did not have wall motion abnormalities and had normal diastolic function. There was evidence for right ventricular hypertrophy with normal RV function. She again had mild pulmonary hypertension with an estimated pressure 39 mm. There is moderate tricuspid regurgitation. Wendy Lopez continues to smoke cigarettes. She does note some indigestion. He also has noticed some left leg weakness and has issues with L4-L5 disc disease. She previously was on a higher dose of present Brintellex but due to tremors she has reduced his dose to 5 mg daily.  She recently underwent a nuclear perfusion study she was noted to have more  pronounced T-wave abnormalities. This was done on 11/20/2012 and was essentially normal without wall motion abnormality, scar or ischemia.  She recently did experience an episode of left neck discomfort with left arm radiation which was nonexertional.  Past Medical History  Diagnosis Date  . Spinal fusion failure     3  . Mitral valve prolapse 06/01/10    echo- EF>55% mild tricupsid regurgitation. There is mild Pulmonary Hypertension.. Right  ventricular systolic pressure iselevated at 30-40 mmHg. LV systolic function is normal.  . Arthritis   . Breast cancer 02/13/2009  . Dysrhythmia     palpitations  . H/O hiatal hernia   . Epilepsy     last seizure 1990's  . PONV (postoperative nausea and vomiting)   . Osteopenia   . Depression   . Cancer of right breast 04/16/2012  . Depression 2014  . SOB (shortness of breath) 10/18/11    met-test    Past Surgical History  Procedure Laterality Date  . Tonsillectomy    . Spine surgery  1999, 2003, 2005  . Hand tendon surgery  2012    tendons released in right hand   . Mastectomy, radical  02/13/2009  . Back surgery      screws- lumbar  . Breast surgery  2010    mastectomy - RIGHT  . Colonoscopy with propofol  03/13/2012    Procedure: COLONOSCOPY WITH PROPOFOL;  Surgeon: Wendy Bumpers, MD;  Location: [facility];  Service: Endoscopy;  Laterality: N/A;  . Esophagogastroduodenoscopy  03/13/2012    Procedure: ESOPHAGOGASTRODUODENOSCOPY (EGD);  Surgeon: Wendy Bumpers, MD;  Location: [facility];  Service:  Endoscopy;  Laterality: N/A;  Reflux, Hiatal Hernia    Allergies  Allergen Reactions  . Carbamazepine Anaphylaxis  . Other     STATES HAS SHORTNESS OF BREATH WITH GENERIC DRUGS     ALSO RASH AND ITCHING  . Pseudoephedrine Hcl Er Other (See Comments)    SEIZURES.  . Valium Other (See Comments)    Hallucinations, spasticity, hyper-relfexive   . Oxycodone Nausea And Vomiting and Rash  . Prednisone Rash and Other (See  Comments)    Prescribing MD told patient he thinks dose (dose pack) was "just too high." 03/09/11   STATES HAS TAKEN SMALLER DOSE AND STILL HAD RASH AND JITTERINESS  . Ibuprofen Swelling  . Percocet [Oxycodone-Acetaminophen] Nausea And Vomiting  . Iodine Rash    SWELLING -  TOPICAL PAINT  . Latex Dermatitis and Rash    Current Outpatient Prescriptions  Medication Sig Dispense Refill  . aspirin-acetaminophen-caffeine (EXCEDRIN EXTRA STRENGTH) 250-250-65 MG per tablet Take 1 tablet by mouth every 6 (six) hours as needed. Pain      . BRINTELLIX 10 MG TABS Take 5 mg by mouth daily.       Marland Kitchen lamoTRIgine (LAMICTAL) 100 MG tablet Take 250 mg by mouth 2 (two) times daily.      . phenytoin (DILANTIN) 100 MG ER capsule Take 1 capsule (100 mg total) by mouth 4 (four) times daily.  120 capsule  3  . TOPROL XL 100 MG 24 hr tablet TAKE 1 & 1/2 TABLETS BY MOUTH EVERY DAY  45 tablet  7   No current facility-administered medications for this visit.    History   Social History  . Marital Status: Married    Spouse Name: N/A    Number of Children: N/A  . Years of Education: N/A   Occupational History  . Not on file.   Social History Main Topics  . Smoking status: Current Every Day Smoker -- 0.50 packs/day    Types: Cigarettes  . Smokeless tobacco: Never Used     Comment: 1/2 pk per day  . Alcohol Use: No  . Drug Use: No  . Sexual Activity: Not on file   Other Topics Concern  . Not on file   Social History Narrative  . No narrative on file    Social she is married and has one child. She's been smoking since age 59.  ROS is negative for fevers, chills or night sweats. She denies significant weight loss. She did have tremors at high doses of brintellix. She does note some mild shortness of breath with activity. She denies wheezing. She denies recent chest pressure but at times has noticed a sharp fleeting episode of chest sensation. She does note occasional indigestion but denies any  abdominal pain.. She denies GU issues. At times she does note some left leg weakness attributed by her lumbar disc disease. She does have a history of breast CA.  Other system review is negative.  PE BP 138/80  Pulse 68  Ht 5\' 2"  (1.575 m)  Wt 141 lb 9.6 oz (64.229 kg)  BMI 25.89 kg/m2  LMP 11/03/1993  General: Alert, oriented, no distress.  Skin: normal turgor, no rashes HEENT: Normocephalic, atraumatic. Pupils round and reactive; sclera anicteric;no lid lag.  Nose without nasal septal hypertrophy Mouth/Parynx benign; Mallinpatti scale 2 Neck: No JVD, no carotid briuts Chest, status post right mastectomy Lungs: decreased BS; no wheezing or rales Heart: RRR, s1 s2 normal 1/6 sem at apex; no ectopy Abdomen: soft, nontender; no  hepatosplenomehaly, BS+; abdominal aorta nontender and not dilated by palpation. Pulses 2+ Extremities: no clubbing cyanosis or edema, Homan's sign negative  Neurologic: grossly nonfocal  ECG: Normal sinus rhythm at 68. T-wave abnormalities  inferiorly in V3 through V6  LABS:  BMET    Component Value Date/Time   NA 140 09/27/2012 1432   K 4.0 09/27/2012 1432   CL 103 09/27/2012 1432   CO2 27 09/27/2012 1432   GLUCOSE 80 09/27/2012 1432   BUN 11 09/27/2012 1432   CREATININE 0.77 09/27/2012 1432   CALCIUM 9.0 09/27/2012 1432   GFRNONAA >90 09/27/2012 1432   GFRAA >90 09/27/2012 1432     Hepatic Function Panel     Component Value Date/Time   PROT 6.9 03/26/2012 1520   ALBUMIN 3.9 03/26/2012 1520   AST 23 03/26/2012 1520   ALT 27 03/26/2012 1520   ALKPHOS 121* 03/26/2012 1520   BILITOT 0.2* 03/26/2012 1520     CBC    Component Value Date/Time   WBC 6.1 09/27/2012 1432   WBC 6.2 12/28/2009 0941   RBC 5.18* 09/27/2012 1432   RBC 5.07 12/28/2009 0941   HGB 16.2* 09/27/2012 1432   HGB 16.4* 12/28/2009 0941   HCT 46.1* 09/27/2012 1432   HCT 47.1* 12/28/2009 0941   PLT 176 09/27/2012 1432   PLT 170 12/28/2009 0941   MCV 89.0 09/27/2012 1432   MCV 92.8 12/28/2009  0941   MCH 31.3 09/27/2012 1432   MCH 32.3 12/28/2009 0941   MCHC 35.1 09/27/2012 1432   MCHC 34.8 12/28/2009 0941   RDW 13.0 09/27/2012 1432   RDW 12.8 12/28/2009 0941   LYMPHSABS 3.1 09/27/2012 1432   LYMPHSABS 2.0 12/28/2009 0941   MONOABS 0.6 09/27/2012 1432   MONOABS 0.6 12/28/2009 0941   EOSABS 0.2 09/27/2012 1432   EOSABS 0.2 12/28/2009 0941   BASOSABS 0.1 09/27/2012 1432   BASOSABS 0.1 12/28/2009 0941     BNP No results found for this basename: probnp    Lipid Panel  No results found for this basename: chol,  trig,  hdl,  cholhdl,  vldl,  ldlcalc     RADIOLOGY: No results found.    ASSESSMENT AND PLAN: Wendy Lopez is a 52 year old female with a long-standing tobacco history of greater than 35 years. She does have T-wave abnormalities. Prior cardiac catheterization has shown normal coronary arteries. A nuclear perfusion study in 2012 showed normal perfusion. A cardiopulmonary met test in July 2013 showed blunted chronotropic response without ischemic changes but she did have reduced functional status with peak O2 at 69% of predicted. She does have mild pulmonary hypertension.  Her echo continues to show normal function without wall motion abnormalities despite her ECG changes. Her most recent nuclear perfusion study continues to show normal perfusion with mild breast attenuation. There was no evidence for scar or ischemia. I suspect her recent neck and lateral aspect of her arm discomfort is more musculoskeletal rather than ischemia mediated chest pain. She does have baseline T wave abnormalities which have vacillated over the years. I tried tried reassurance at this point I do not feel ischemia mediated. Smoking cessation is paramount. I will see her in 6 months for follow up evaluation.   Lennette Bihari, MD, [facility]  12/25/2012 4:52 PM

## 2012-12-25 NOTE — Patient Instructions (Addendum)
Your physician recommends that you schedule a follow-up appointment in: 6 MONTHS. No changes were made today in your therapy. 

## 2012-12-28 ENCOUNTER — Ambulatory Visit: Payer: Self-pay | Admitting: Psychology

## 2012-12-28 ENCOUNTER — Encounter: Payer: Self-pay | Admitting: Cardiovascular Disease

## 2013-01-24 ENCOUNTER — Ambulatory Visit: Payer: Self-pay | Admitting: Diagnostic Neuroimaging

## 2013-01-28 DIAGNOSIS — Z23 Encounter for immunization: Secondary | ICD-10-CM | POA: Diagnosis not present

## 2013-01-29 DIAGNOSIS — M47812 Spondylosis without myelopathy or radiculopathy, cervical region: Secondary | ICD-10-CM | POA: Diagnosis not present

## 2013-01-29 DIAGNOSIS — M47817 Spondylosis without myelopathy or radiculopathy, lumbosacral region: Secondary | ICD-10-CM | POA: Diagnosis not present

## 2013-01-31 DIAGNOSIS — J019 Acute sinusitis, unspecified: Secondary | ICD-10-CM | POA: Diagnosis not present

## 2013-02-04 DIAGNOSIS — M5137 Other intervertebral disc degeneration, lumbosacral region: Secondary | ICD-10-CM | POA: Diagnosis not present

## 2013-02-04 DIAGNOSIS — M5126 Other intervertebral disc displacement, lumbar region: Secondary | ICD-10-CM | POA: Diagnosis not present

## 2013-02-04 DIAGNOSIS — Z981 Arthrodesis status: Secondary | ICD-10-CM | POA: Diagnosis not present

## 2013-02-04 DIAGNOSIS — R209 Unspecified disturbances of skin sensation: Secondary | ICD-10-CM | POA: Diagnosis not present

## 2013-02-04 DIAGNOSIS — M47812 Spondylosis without myelopathy or radiculopathy, cervical region: Secondary | ICD-10-CM | POA: Diagnosis not present

## 2013-02-04 DIAGNOSIS — G96198 Other disorders of meninges, not elsewhere classified: Secondary | ICD-10-CM | POA: Diagnosis not present

## 2013-02-04 DIAGNOSIS — M5020 Other cervical disc displacement, unspecified cervical region: Secondary | ICD-10-CM | POA: Diagnosis not present

## 2013-02-04 DIAGNOSIS — M4802 Spinal stenosis, cervical region: Secondary | ICD-10-CM | POA: Diagnosis not present

## 2013-02-04 DIAGNOSIS — M47817 Spondylosis without myelopathy or radiculopathy, lumbosacral region: Secondary | ICD-10-CM | POA: Diagnosis not present

## 2013-02-06 ENCOUNTER — Encounter: Payer: Self-pay | Admitting: Neurology

## 2013-02-07 ENCOUNTER — Ambulatory Visit (INDEPENDENT_AMBULATORY_CARE_PROVIDER_SITE_OTHER): Payer: Medicare Other | Admitting: Neurology

## 2013-02-07 ENCOUNTER — Encounter: Payer: Self-pay | Admitting: Neurology

## 2013-02-07 VITALS — BP 132/86 | HR 75 | Ht 62.0 in | Wt 143.0 lb

## 2013-02-07 DIAGNOSIS — G40209 Localization-related (focal) (partial) symptomatic epilepsy and epileptic syndromes with complex partial seizures, not intractable, without status epilepticus: Secondary | ICD-10-CM | POA: Diagnosis not present

## 2013-02-07 DIAGNOSIS — R413 Other amnesia: Secondary | ICD-10-CM

## 2013-02-07 DIAGNOSIS — Z5181 Encounter for therapeutic drug level monitoring: Secondary | ICD-10-CM | POA: Diagnosis not present

## 2013-02-07 HISTORY — DX: Other amnesia: R41.3

## 2013-02-07 HISTORY — DX: Localization-related (focal) (partial) symptomatic epilepsy and epileptic syndromes with complex partial seizures, not intractable, without status epilepticus: G40.209

## 2013-02-07 MED ORDER — PHENYTOIN SODIUM EXTENDED 100 MG PO CAPS: 100.0000 mg | ORAL_CAPSULE | Freq: Four times a day (QID) | ORAL | Status: DC

## 2013-02-07 MED ORDER — LAMOTRIGINE 100 MG PO TABS: ORAL_TABLET | ORAL | Status: DC

## 2013-02-07 NOTE — Progress Notes (Signed)
Reason for visit: Seizures  Wendy Lopez is an 52 y.o. female  History of present illness:  Wendy Lopez is a 52 year old right-handed white female with a history of seizures and depression. The patient had seizure onset around age 7. The patient has been on Dilantin and Lamictal. In the past, the patient has had difficulty tolerating several other medications. The patient indicates that she continues brand-name only medications, indicating that all generic medications are not tolerated. The patient has been seen by psychiatry, and she was started on an antidepressant medication. The patient indicates that this has helped her depression, but her memory issues remain a problem. The patient describes what sounds like intermittent amnestic episodes, where she has no recollection of what she has done for brief periods of time. The patient may have trouble with directions at times, but she indicates that she has no problems doing her finances. The patient takes notes to help keep up with appointments, and she has some difficulty with her medications. The patient does operate a motor vehicle. The patient reports no overt seizures since last seen one year ago by Dr. Sandria Manly. The patient returns to this office for further evaluation. The patient is on disability for chronic low back pain, the patient has had 3 different low back procedures. The patient goes on to say that she has not been able to see any colors for a prolonged period time. More recently, she has started to see that everything is purple, and this has upset her, and has increased her cigarette usage.   Past Medical History  Diagnosis Date  . Spinal fusion failure     3  . Mitral valve prolapse 06/01/10    echo- EF>55% mild tricupsid regurgitation. There is mild Pulmonary Hypertension.. Right  ventricular systolic pressure iselevated at 30-40 mmHg. LV systolic function is normal.  . Arthritis   . Breast cancer 02/13/2009  . Dysrhythmia      palpitations  . H/O hiatal hernia   . Epilepsy     last seizure 1990's  . PONV (postoperative nausea and vomiting)   . Osteopenia   . Depression   . Cancer of right breast 04/16/2012  . Depression 2014  . SOB (shortness of breath) 10/18/11    met-test  . Memory loss   . Anxiety and depression   . Localization-related (focal) (partial) epilepsy and epileptic syndromes with complex partial seizures, without mention of intractable epilepsy 02/07/2013  . Memory deficit 02/07/2013    Past Surgical History  Procedure Laterality Date  . Tonsillectomy    . Spine surgery  1999, 2003, 2005  . Hand tendon surgery  2012    tendons released in right hand   . Mastectomy, radical  02/13/2009  . Back surgery      screws- lumbar  . Breast surgery  2010    mastectomy - RIGHT  . Colonoscopy with propofol  03/13/2012    Procedure: COLONOSCOPY WITH PROPOFOL;  Surgeon: Charolett Bumpers, MD;  Location: [facility];  Service: Endoscopy;  Laterality: N/A;  . Esophagogastroduodenoscopy  03/13/2012    Procedure: ESOPHAGOGASTRODUODENOSCOPY (EGD);  Surgeon: Charolett Bumpers, MD;  Location: [facility];  Service: Endoscopy;  Laterality: N/A;  Reflux, Hiatal Hernia    Family History  Problem Relation Age of Onset  . Arthritis Mother     rhuematoid  . Heart disease Mother     Atrial fib  . Heart disease Father   . Heart disease Sister     Atrial  fib    Social history:  reports that she has been smoking Cigarettes.  She has been smoking about 0.50 packs per day. She has never used smokeless tobacco. She reports that she does not drink alcohol or use illicit drugs.    Allergies  Allergen Reactions  . Carbamazepine Anaphylaxis  . Other     STATES HAS SHORTNESS OF BREATH WITH GENERIC DRUGS     ALSO RASH AND ITCHING  . Pseudoephedrine Hcl Er Other (See Comments)    SEIZURES.  . Valium Other (See Comments)    Hallucinations, spasticity, hyper-relfexive   . Oxycodone Nausea And Vomiting and  Rash  . Prednisone Rash and Other (See Comments)    Prescribing MD told patient he thinks dose (dose pack) was "just too high." 03/09/11   STATES HAS TAKEN SMALLER DOSE AND STILL HAD RASH AND JITTERINESS  . Ibuprofen Swelling  . Percocet [Oxycodone-Acetaminophen] Nausea And Vomiting  . Iodine Rash    SWELLING -  TOPICAL PAINT  . Latex Dermatitis and Rash    Medications:  Current Outpatient Prescriptions on File Prior to Visit  Medication Sig Dispense Refill  . aspirin-acetaminophen-caffeine (EXCEDRIN EXTRA STRENGTH) 250-250-65 MG per tablet Take 1 tablet by mouth every 6 (six) hours as needed. Pain      . BRINTELLIX 10 MG TABS Take 5 mg by mouth daily.       . cyanocobalamin 100 MCG tablet Take 100 mcg by mouth daily.      Marland Kitchen HYDROcodone-acetaminophen (NORCO/VICODIN) 5-325 MG per tablet Take 1 tablet by mouth every 6 (six) hours as needed for pain.      . TOPROL XL 100 MG 24 hr tablet TAKE 1 & 1/2 TABLETS BY MOUTH EVERY DAY  45 tablet  7  . vitamin C (ASCORBIC ACID) 500 MG tablet Take 500 mg by mouth daily.       No current facility-administered medications on file prior to visit.    ROS:  Out of a complete 14 system review of symptoms, the patient complains only of the following symptoms, and all other reviewed systems are negative.  Blurred vision  Joint pain, muscle cramps Memory loss, confusion, headache, numbness, weakness, slurred speech Tremor Depression, anxiety Restless legs  Blood pressure 132/86, pulse 75, height 5\' 2"  (1.575 m), weight 143 lb (64.864 kg), last menstrual period 11/03/1993.  Physical Exam  General: The patient is alert and cooperative at the time of the examination.  Skin: No significant peripheral edema is noted.   Neurologic Exam  Mental status: The Mini-Mental status examination done today shows a total score of 25/30.  Cranial nerves: Facial symmetry is present. Speech is normal, no aphasia or dysarthria is noted. Extraocular movements are  full. Visual fields are full.  Motor: The patient has good strength in all 4 extremities.  Sensory examination:  Soft touch sensation is symmetric throughout.   Coordination: The patient has good finger-nose-finger and heel-to-shin bilaterally.  Gait and station: The patient has a normal gait. Tandem gait is normal. Romberg is negative. No drift is seen.  Reflexes: Deep tendon reflexes are symmetric.   Assessment/Plan:  1. History seizures  2. Anxiety and depression  3. Memory disorder  The patient indicates that she is having brief amnestic episode. The patient will undergo another EEG evaluation. The patient did have a MRI of the brain done approximately 15 months ago that showed minimal small vessel changes. The patient was given prescriptions for her antiepileptic medications, and she needs to followup with  her psychiatrist. The patient will followup through this office in about 6 months. I suspect much of her memory issues are of a psychiatric origin.   Marlan Palau MD 02/07/2013 9:16 PM  [facility] [address] [place], [place] [address]  Phone [phone] Fax [phone]

## 2013-02-09 ENCOUNTER — Telehealth: Payer: Self-pay | Admitting: Neurology

## 2013-02-09 LAB — COMPREHENSIVE METABOLIC PANEL
ALT: 11 IU/L (ref 0–32)
AST: 17 IU/L (ref 0–40)
Albumin/Globulin Ratio: 1.8 (ref 1.1–2.5)
Albumin: 4.2 g/dL (ref 3.5–5.5)
Alkaline Phosphatase: 139 IU/L — ABNORMAL HIGH (ref 39–117)
BUN/Creatinine Ratio: 13 (ref 9–23)
BUN: 10 mg/dL (ref 6–24)
CO2: 25 mmol/L (ref 18–29)
Calcium: 9.1 mg/dL (ref 8.7–10.2)
Chloride: 100 mmol/L (ref 97–108)
Creatinine, Ser: 0.75 mg/dL (ref 0.57–1.00)
GFR calc Af Amer: 105 mL/min/{1.73_m2} (ref >59)
GFR calc non Af Amer: 91 mL/min/{1.73_m2} (ref >59)
Globulin, Total: 2.3 g/dL (ref 1.5–4.5)
Glucose: 70 mg/dL (ref 65–99)
Potassium: 4.2 mmol/L (ref 3.5–5.2)
Sodium: 145 mmol/L — ABNORMAL HIGH (ref 134–144)
Total Bilirubin: 0.3 mg/dL (ref 0.0–1.2)
Total Protein: 6.5 g/dL (ref 6.0–8.5)

## 2013-02-09 LAB — CBC WITH DIFFERENTIAL
Basophils Absolute: 0.1 10*3/uL (ref 0.0–0.2)
Basos: 1 %
Eos: 3 %
Eosinophils Absolute: 0.2 10*3/uL (ref 0.0–0.4)
HCT: 48.7 % — ABNORMAL HIGH (ref 34.0–46.6)
Hemoglobin: 17.1 g/dL — ABNORMAL HIGH (ref 11.1–15.9)
Immature Grans (Abs): 0 10*3/uL (ref 0.0–0.1)
Immature Granulocytes: 0 %
Lymphocytes Absolute: 2.6 10*3/uL (ref 0.7–3.1)
Lymphs: 35 %
MCH: 31.5 pg (ref 26.6–33.0)
MCHC: 35.1 g/dL (ref 31.5–35.7)
MCV: 90 fL (ref 79–97)
Monocytes Absolute: 0.9 10*3/uL (ref 0.1–0.9)
Monocytes: 12 %
Neutrophils Absolute: 3.8 10*3/uL (ref 1.4–7.0)
Neutrophils Relative %: 49 %
Platelets: 209 10*3/uL (ref 150–379)
RBC: 5.42 x10E6/uL — ABNORMAL HIGH (ref 3.77–5.28)
RDW: 13.1 % (ref 12.3–15.4)
WBC: 7.6 10*3/uL (ref 3.4–10.8)

## 2013-02-09 LAB — LAMOTRIGINE LEVEL: Lamotrigine Lvl: 8.5 ug/mL (ref 2.0–20.0)

## 2013-02-09 LAB — PHENYTOIN LEVEL, TOTAL: Phenytoin Lvl: 21.4 ug/mL (ref 10.0–20.0)

## 2013-02-09 NOTE — Telephone Encounter (Signed)
I called the patient. The blood work shows a dilantin level of 21. The patient feels OK with the dose of this and I will not change the dilantin dosing. The Hgb is elevated, but the patient is a smoker. The alk phos is minimally elevated.

## 2013-02-11 ENCOUNTER — Ambulatory Visit (INDEPENDENT_AMBULATORY_CARE_PROVIDER_SITE_OTHER): Payer: Medicare Other

## 2013-02-11 ENCOUNTER — Telehealth: Payer: Self-pay | Admitting: Neurology

## 2013-02-11 DIAGNOSIS — G40209 Localization-related (focal) (partial) symptomatic epilepsy and epileptic syndromes with complex partial seizures, not intractable, without status epilepticus: Secondary | ICD-10-CM

## 2013-02-11 DIAGNOSIS — R413 Other amnesia: Secondary | ICD-10-CM

## 2013-02-11 NOTE — Telephone Encounter (Signed)
I called the patient. The EEG study was unremarkable. In retrospect, the patient believes that she has had some blurring of vision and slurring of speech. The patient has a low toxic level of Dilantin, we will cut back to 100 mg 3 times daily on this medication.

## 2013-02-11 NOTE — Procedures (Signed)
  History:  Wendy Lopez is a 52 year old patient with a history of seizures and depression. The patient recently has noted a "purple haze" in her vision. The patient is being evaluated for this new issue.  This is a routine EEG. No skull defects are noted. Medications include Excedrin Migraine, Zyrtec, vitamin B12, hydrocodone, Lamictal, Dilantin, Toprol, and vitamin C.   EEG classification: Normal awake and drowsy  Description of the recording: The background rhythms of this recording consists of a fairly well modulated medium amplitude alpha rhythm of 8 Hz that is reactive to eye opening and closure. As the record progresses, the patient appears to remain in the waking state throughout the recording. Photic stimulation was performed, resulting in a bilateral and symmetric photic driving response. Hyperventilation was also performed, resulting in a minimal buildup of the background rhythm activities without significant slowing seen. Toward the end of the recording, the patient enters the drowsy state with slight symmetric slowing seen. The patient never enters stage II sleep. At no time during the recording does there appear to be evidence of spike or spike wave discharges or evidence of focal slowing. EKG monitor shows no evidence of cardiac rhythm abnormalities with a heart rate of 72.  Impression: This is a normal EEG recording in the waking and drowsy state. No evidence of ictal or interictal discharges are seen.

## 2013-02-16 ENCOUNTER — Other Ambulatory Visit: Payer: Self-pay | Admitting: Diagnostic Neuroimaging

## 2013-02-21 ENCOUNTER — Other Ambulatory Visit: Payer: Self-pay

## 2013-02-23 ENCOUNTER — Other Ambulatory Visit: Payer: Self-pay | Admitting: Diagnostic Neuroimaging

## 2013-03-03 ENCOUNTER — Other Ambulatory Visit: Payer: Self-pay | Admitting: Diagnostic Neuroimaging

## 2013-03-08 DIAGNOSIS — Z1231 Encounter for screening mammogram for malignant neoplasm of breast: Secondary | ICD-10-CM | POA: Diagnosis not present

## 2013-03-19 DIAGNOSIS — I10 Essential (primary) hypertension: Secondary | ICD-10-CM | POA: Diagnosis not present

## 2013-03-19 DIAGNOSIS — F329 Major depressive disorder, single episode, unspecified: Secondary | ICD-10-CM | POA: Diagnosis not present

## 2013-03-19 DIAGNOSIS — F17200 Nicotine dependence, unspecified, uncomplicated: Secondary | ICD-10-CM | POA: Diagnosis not present

## 2013-03-19 DIAGNOSIS — C50919 Malignant neoplasm of unspecified site of unspecified female breast: Secondary | ICD-10-CM | POA: Diagnosis not present

## 2013-03-19 DIAGNOSIS — R569 Unspecified convulsions: Secondary | ICD-10-CM | POA: Diagnosis not present

## 2013-03-19 DIAGNOSIS — Z0000 Encounter for general adult medical examination without abnormal findings: Secondary | ICD-10-CM | POA: Diagnosis not present

## 2013-03-19 DIAGNOSIS — Z1331 Encounter for screening for depression: Secondary | ICD-10-CM | POA: Diagnosis not present

## 2013-04-03 ENCOUNTER — Ambulatory Visit (HOSPITAL_COMMUNITY): Payer: Medicare Other

## 2013-04-04 ENCOUNTER — Ambulatory Visit (HOSPITAL_COMMUNITY): Payer: Medicare Other

## 2013-04-05 ENCOUNTER — Ambulatory Visit (HOSPITAL_COMMUNITY): Payer: Medicare Other

## 2013-04-06 NOTE — Progress Notes (Signed)
This encounter was created in error - please disregard.

## 2013-04-29 ENCOUNTER — Ambulatory Visit (INDEPENDENT_AMBULATORY_CARE_PROVIDER_SITE_OTHER): Payer: Medicare Other | Admitting: General Surgery

## 2013-05-13 ENCOUNTER — Encounter (INDEPENDENT_AMBULATORY_CARE_PROVIDER_SITE_OTHER): Payer: Self-pay | Admitting: General Surgery

## 2013-05-22 ENCOUNTER — Encounter (INDEPENDENT_AMBULATORY_CARE_PROVIDER_SITE_OTHER): Payer: Self-pay

## 2013-05-31 ENCOUNTER — Other Ambulatory Visit: Payer: Self-pay | Admitting: *Deleted

## 2013-05-31 MED ORDER — METOPROLOL SUCCINATE ER 100 MG PO TB24: ORAL_TABLET | ORAL | Status: DC

## 2013-06-25 DIAGNOSIS — M47817 Spondylosis without myelopathy or radiculopathy, lumbosacral region: Secondary | ICD-10-CM | POA: Diagnosis not present

## 2013-06-25 DIAGNOSIS — M47812 Spondylosis without myelopathy or radiculopathy, cervical region: Secondary | ICD-10-CM | POA: Diagnosis not present

## 2013-06-27 ENCOUNTER — Telehealth: Payer: Self-pay | Admitting: *Deleted

## 2013-06-27 MED ORDER — LAMICTAL 100 MG PO TABS: 50.0000 mg | ORAL_TABLET | Freq: Two times a day (BID) | ORAL | Status: DC

## 2013-06-27 MED ORDER — LAMOTRIGINE 200 MG PO TABS: 200.0000 mg | ORAL_TABLET | Freq: Two times a day (BID) | ORAL | Status: DC

## 2013-06-27 NOTE — Telephone Encounter (Signed)
Rx's have been sent. 

## 2013-06-27 NOTE — Telephone Encounter (Signed)
Wendy Lopez, pharmacist calling about pt and her lamictal.   Now with MCR part D.  Close to donut hole.  Saving $1000.00 by taking lamictal one 200mg  tab and 1/2 tab of 100mg  tablet (still brand name) for total of 250mg  po bid.  Sent to [place] after speaking to her.[phone]

## 2013-07-21 NOTE — Progress Notes (Addendum)
Wenda Low, [place] [employer], [address] [address]  Cancer of right breast - Plan: CBC with Differential, Comprehensive metabolic panel, Cancer antigen 27.29, CBC with Differential, Comprehensive metabolic panel, Cancer antigen 27.29  Erythrocytosis - Plan: CBC with Differential, Erythropoietin, JAK2 genotypr, Miscellaneous test, Blood Gas, Arterial, CBC with Differential, Erythropoietin, JAK2 genotypr, Miscellaneous test  CURRENT THERAPY: Surveillance per NCCN guidelines  INTERVAL HISTORY: Wendy Lopez 53 y.o. female returns for  regular  visit for followup of breast cancer, stage I, ER +99%, PR +54%, Ki-67 marker 15%, HER-2/neu nonamplified. Oncotype score was intermediate and she was offered chemotherapy. All 3 nodes were negative. She took 3 cycles of Taxotere/Cytoxan but stopped due to intolerance, she was placed on Arimidex which she stopped due to intolerance.  I personally reviewed and went over laboratory results with the patient.  The results are noted within this dictation.  We will need to update her labs today.  Since March 2011, the patient's Hgb has been above the normal limits of normal.  During chart review, I do not see a work-up for this erythrocytosis.  As a result, I will order a few additional testing today.  I personally reviewed and went over radiographic studies with the patient.  The results are noted within this dictation.  Last screening mammogram was on 03/08/2013 and this test was BIRADS 1.  This imaging test is a test that was scanned into [facility].  She will be due for her next screening mammogram in November 2015.   She is seeing neurology for her seizure disorder.    From an oncology standpoint she is doing well.  She notes a palpable abnormality in her left breast in the 12 o'clock position that concerns her.  Mammogram in November 2014 was negative.   Smoking cessation education was provided today.   From an oncology standpoint,  she denies any other complaints and ROS questioning is negative.   Past Medical History  Diagnosis Date  . Spinal fusion failure     3  . Mitral valve prolapse 06/01/10    echo- EF>55% mild tricupsid regurgitation. There is mild Pulmonary Hypertension.. Right  ventricular systolic pressure iselevated at 30-40 mmHg. LV systolic function is normal.  . Arthritis   . Breast cancer 02/13/2009  . Dysrhythmia     palpitations  . H/O hiatal hernia   . Epilepsy     last seizure 1990's  . PONV (postoperative nausea and vomiting)   . Osteopenia   . Depression   . Cancer of right breast 04/16/2012  . Depression 2014  . SOB (shortness of breath) 10/18/11    met-test  . Memory loss   . Anxiety and depression   . Localization-related (focal) (partial) epilepsy and epileptic syndromes with complex partial seizures, without mention of intractable epilepsy 02/07/2013  . Memory deficit 02/07/2013    has Cancer of right breast; HTN (hypertension); Lumbar disc disease; Tobacco abuse; Localization-related (focal) (partial) epilepsy and epileptic syndromes with complex partial seizures, without mention of intractable epilepsy; and Memory deficit on her problem list.     is allergic to carbamazepine; other; pseudoephedrine hcl er; valium; oxycodone; prednisone; ibuprofen; percocet; iodine; and latex.  Ms. Rarick does not currently have medications on file.  Past Surgical History  Procedure Laterality Date  . Tonsillectomy    . Spine surgery  1999, 2003, 2005  . Hand tendon surgery  2012    tendons released in right hand   . Mastectomy, radical  02/13/2009  . Back surgery      screws- lumbar  . Breast surgery  2010    mastectomy - RIGHT  . Colonoscopy with propofol  03/13/2012    Procedure: COLONOSCOPY WITH PROPOFOL;  Surgeon: Garlan Fair, MD;  Location: [facility];  Service: Endoscopy;  Laterality: N/A;  . Esophagogastroduodenoscopy  03/13/2012    Procedure: ESOPHAGOGASTRODUODENOSCOPY  (EGD);  Surgeon: Garlan Fair, MD;  Location: [facility];  Service: Endoscopy;  Laterality: N/A;  Reflux, Hiatal Hernia    Denies any headaches, dizziness, double vision, fevers, chills, night sweats, nausea, vomiting, diarrhea, constipation, chest pain, heart palpitations, shortness of breath, blood in stool, black tarry stool, urinary pain, urinary burning, urinary frequency, hematuria.   PHYSICAL EXAMINATION  ECOG PERFORMANCE STATUS: 0 - Asymptomatic  Filed Vitals:   07/23/13 1500  BP: 172/78  Pulse: 82  Temp: 98.3 F (36.8 C)  Resp: 18    GENERAL:alert, no distress, well nourished, well developed, comfortable, cooperative, smiling and thickened facial skin from smoking SKIN: skin color, texture, turgor are normal, no rashes or significant lesions HEAD: Normocephalic, No masses, lesions, tenderness or abnormalities EYES: normal, PERRLA, EOMI, Conjunctiva are pink and non-injected EARS: External ears normal OROPHARYNX:mucous membranes are moist  NECK: supple, no adenopathy, thyroid normal size, non-tender, without nodularity, no stridor, non-tender, trachea midline LYMPH:  no palpable lymphadenopathy, no hepatosplenomegaly BREAST:left breast normal without mass, skin or nipple changes or axillary nodes with a mobile, soft fibroglandular/cystic area in the 12 o'clock position measuring about 1 cm without clear margins, post-mastectomy site well healed and free of suspicious changes LUNGS: clear to auscultation and percussion HEART: regular rate & rhythm, no murmurs, no gallops, S1 normal and S2 normal ABDOMEN:abdomen soft, non-tender, normal bowel sounds, no masses or organomegaly and no hepatosplenomegaly BACK: Back symmetric, no curvature., No CVA tenderness EXTREMITIES:less then 2 second capillary refill, no joint deformities, effusion, or inflammation, no edema, no skin discoloration, no clubbing, no cyanosis  NEURO: alert & oriented x 3 with fluent speech, no focal  motor/sensory deficits, gait normal   LABORATORY DATA: CBC    Component Value Date/Time   WBC 6.4 07/23/2013 1603   WBC 7.6 02/07/2013 1004   WBC 6.2 12/28/2009 0941   RBC 5.05 07/23/2013 1603   RBC 5.42* 02/07/2013 1004   RBC 5.07 12/28/2009 0941   HGB 15.4* 07/23/2013 1603   HGB 16.4* 12/28/2009 0941   HCT 44.7 07/23/2013 1603   HCT 47.1* 12/28/2009 0941   PLT 170 07/23/2013 1603   PLT 170 12/28/2009 0941   MCV 88.5 07/23/2013 1603   MCV 92.8 12/28/2009 0941   MCH 30.5 07/23/2013 1603   MCH 31.5 02/07/2013 1004   MCH 32.3 12/28/2009 0941   MCHC 34.5 07/23/2013 1603   MCHC 35.1 02/07/2013 1004   MCHC 34.8 12/28/2009 0941   RDW 13.1 07/23/2013 1603   RDW 13.1 02/07/2013 1004   RDW 12.8 12/28/2009 0941   LYMPHSABS 2.8 07/23/2013 1603   LYMPHSABS 2.6 02/07/2013 1004   LYMPHSABS 2.0 12/28/2009 0941   MONOABS 0.6 07/23/2013 1603   MONOABS 0.6 12/28/2009 0941   EOSABS 0.2 07/23/2013 1603   EOSABS 0.2 02/07/2013 1004   BASOSABS 0.1 07/23/2013 1603   BASOSABS 0.1 02/07/2013 1004   BASOSABS 0.1 12/28/2009 0941      Chemistry      Component Value Date/Time   NA 143 07/23/2013 1603   NA 145* 02/07/2013 1004   K 4.3 07/23/2013 1603   CL 104 07/23/2013 1603  CO2 28 07/23/2013 1603   BUN 9 07/23/2013 1603   BUN 10 02/07/2013 1004   CREATININE 0.83 07/23/2013 1603      Component Value Date/Time   CALCIUM 9.2 07/23/2013 1603   ALKPHOS 131* 07/23/2013 1603   AST 21 07/23/2013 1603   ALT 12 07/23/2013 1603   BILITOT 0.2* 07/23/2013 1603      Results for KEIR, FOLAND (MRN [id number]) as of 07/21/2013 21:12  Ref. Range 06/04/2009 09:22 07/10/2009 08:58 09/17/2009 08:48 12/28/2009 09:41 09/09/2010 10:26 04/22/2011 16:03 08/26/2011 11:21 03/26/2012 15:20 09/27/2012 14:32 02/07/2013 10:04  Hemoglobin Latest Range: 11.1-15.9 g/dL 14.6 16.0 (H) 16.2 (H) 16.4 (H) 16.7 (H) 16.4 (H) 15.9 (H) 16.3 (H) 16.2 (H) 17.1 (H)  HCT Latest Range: 34.0-46.6 % 41.9 46.3 46.8 (H) 47.1 (H)  47.9 (H) 44.8 45.7 46.1 (H) 48.7 (H)      ASSESSMENT:  1.  Breast cancer, stage I, ER +99%, PR +54%, Ki-67 marker 15%, HER-2/neu nonamplified. Oncotype score was intermediate and she was offered chemotherapy. All 3 nodes were negative. She took 3 cycles of Taxotere/Cytoxan but stopped due to intolerance, she was placed on Arimidex which she stopped due to intolerance. 2. Erythrocytosis, likely secondary to lung disease from tobacco abuse.  Will work-up today with labs and rule out other causes.  Patient Active Problem List   Diagnosis Date Noted  . Localization-related (focal) (partial) epilepsy and epileptic syndromes with complex partial seizures, without mention of intractable epilepsy 02/07/2013  . Memory deficit 02/07/2013  . HTN (hypertension) 11/25/2012  . Lumbar disc disease 11/25/2012  . Tobacco abuse 11/25/2012  . Cancer of right breast 04/16/2012    PLAN:  1. I personally reviewed and went over laboratory results with the patient.  The results are noted within this dictation. 2. I personally reviewed and went over radiographic studies with the patient.  The results are noted within this dictation.   3. Next screening mammogram is due in November 2015.  4. Labs today: CBC diff, CMET, CA 27.29, erythropoeitin level, JAK2 (exon 12, 13, and 14), ABGs to evaluate carboxyhemoglobin level. 5. Rx for mastectomy supplies 6. Smoking cessation education provided today 7. Return in 6 months for follow-up   THERAPY PLAN:  NCCN guidelines recommends the following surveillance for invasive breast cancer:  A. History and Physical exam every 4-6 months for 5 years and then every 12 months.  B. Mammography every 12 months  C. Women on Tamoxifen: annual gynecologic assessment every 12 months if uterus is present.  D. Women on aromatase inhibitor or who experience ovarian failure secondary to treatment should have monitoring of bone health with a bone mineral density determination at baseline and periodically thereafter.  E. Assess and encourage  adherence to adjuvant endocrine therapy.  F. Evidence suggests that active lifestyle and achieving and maintaining an ideal body weight (20-25 BMI) may lead to optimal breast cancer outcomes.  All questions were answered. The patient knows to call the clinic with any problems, questions or concerns. We can certainly see the patient much sooner if necessary.  Patient and plan discussed with Dr. Farrel Gobble and he is in agreement with the aforementioned.   I spent 25 minutes counseling the patient face to face. The total time spent in the appointment was 30 minutes.  KEFALAS,THOMAS 07/23/2013    Addendum:  ABGs: PH: 7.414 PCO2: 41.3 PO2: 66.1 ctHb 16.2 g/dL sO2: 94.7% ctCO2: 22.1 mmol/L cHCO3-: 26.0 mmol/L  She demonstrates signs of hypoxia  KEFALAS,THOMAS 07/23/2013

## 2013-07-23 ENCOUNTER — Encounter (HOSPITAL_COMMUNITY): Payer: Medicare Other | Attending: Oncology | Admitting: Oncology

## 2013-07-23 ENCOUNTER — Encounter (HOSPITAL_COMMUNITY): Payer: Self-pay | Admitting: Oncology

## 2013-07-23 VITALS — BP 172/78 | HR 82 | Temp 98.3°F | Resp 18 | Wt 148.0 lb

## 2013-07-23 DIAGNOSIS — F3289 Other specified depressive episodes: Secondary | ICD-10-CM | POA: Insufficient documentation

## 2013-07-23 DIAGNOSIS — F17200 Nicotine dependence, unspecified, uncomplicated: Secondary | ICD-10-CM | POA: Diagnosis not present

## 2013-07-23 DIAGNOSIS — R413 Other amnesia: Secondary | ICD-10-CM | POA: Insufficient documentation

## 2013-07-23 DIAGNOSIS — G40209 Localization-related (focal) (partial) symptomatic epilepsy and epileptic syndromes with complex partial seizures, not intractable, without status epilepticus: Secondary | ICD-10-CM | POA: Diagnosis not present

## 2013-07-23 DIAGNOSIS — I059 Rheumatic mitral valve disease, unspecified: Secondary | ICD-10-CM | POA: Diagnosis not present

## 2013-07-23 DIAGNOSIS — M810 Age-related osteoporosis without current pathological fracture: Secondary | ICD-10-CM | POA: Diagnosis not present

## 2013-07-23 DIAGNOSIS — F329 Major depressive disorder, single episode, unspecified: Secondary | ICD-10-CM | POA: Insufficient documentation

## 2013-07-23 DIAGNOSIS — Z9221 Personal history of antineoplastic chemotherapy: Secondary | ICD-10-CM | POA: Insufficient documentation

## 2013-07-23 DIAGNOSIS — J984 Other disorders of lung: Secondary | ICD-10-CM

## 2013-07-23 DIAGNOSIS — I2789 Other specified pulmonary heart diseases: Secondary | ICD-10-CM | POA: Insufficient documentation

## 2013-07-23 DIAGNOSIS — D751 Secondary polycythemia: Secondary | ICD-10-CM | POA: Diagnosis not present

## 2013-07-23 DIAGNOSIS — Z853 Personal history of malignant neoplasm of breast: Secondary | ICD-10-CM | POA: Diagnosis not present

## 2013-07-23 DIAGNOSIS — Z09 Encounter for follow-up examination after completed treatment for conditions other than malignant neoplasm: Secondary | ICD-10-CM | POA: Insufficient documentation

## 2013-07-23 DIAGNOSIS — I079 Rheumatic tricuspid valve disease, unspecified: Secondary | ICD-10-CM | POA: Insufficient documentation

## 2013-07-23 DIAGNOSIS — Z9010 Acquired absence of unspecified breast and nipple: Secondary | ICD-10-CM | POA: Insufficient documentation

## 2013-07-23 DIAGNOSIS — C50911 Malignant neoplasm of unspecified site of right female breast: Secondary | ICD-10-CM

## 2013-07-23 DIAGNOSIS — C50919 Malignant neoplasm of unspecified site of unspecified female breast: Secondary | ICD-10-CM

## 2013-07-23 LAB — COMPREHENSIVE METABOLIC PANEL
ALT: 12 U/L (ref 0–35)
AST: 21 U/L (ref 0–37)
Albumin: 3.9 g/dL (ref 3.5–5.2)
Alkaline Phosphatase: 131 U/L — ABNORMAL HIGH (ref 39–117)
BUN: 9 mg/dL (ref 6–23)
CO2: 28 mEq/L (ref 19–32)
Calcium: 9.2 mg/dL (ref 8.4–10.5)
Chloride: 104 mEq/L (ref 96–112)
Creatinine, Ser: 0.83 mg/dL (ref 0.50–1.10)
GFR calc Af Amer: >90 mL/min (ref >90)
GFR calc non Af Amer: 79 mL/min — ABNORMAL LOW (ref >90)
Glucose, Bld: 91 mg/dL (ref 70–99)
Potassium: 4.3 mEq/L (ref 3.7–5.3)
Sodium: 143 mEq/L (ref 137–147)
Total Bilirubin: 0.2 mg/dL — ABNORMAL LOW (ref 0.3–1.2)
Total Protein: 7.2 g/dL (ref 6.0–8.3)

## 2013-07-23 LAB — CBC WITH DIFFERENTIAL/PLATELET
Basophils Absolute: 0.1 10*3/uL (ref 0.0–0.1)
Basophils Relative: 1 % (ref 0–1)
Eosinophils Absolute: 0.2 10*3/uL (ref 0.0–0.7)
Eosinophils Relative: 3 % (ref 0–5)
HCT: 44.7 % (ref 36.0–46.0)
Hemoglobin: 15.4 g/dL — ABNORMAL HIGH (ref 12.0–15.0)
Lymphocytes Relative: 44 % (ref 12–46)
Lymphs Abs: 2.8 10*3/uL (ref 0.7–4.0)
MCH: 30.5 pg (ref 26.0–34.0)
MCHC: 34.5 g/dL (ref 30.0–36.0)
MCV: 88.5 fL (ref 78.0–100.0)
Monocytes Absolute: 0.6 10*3/uL (ref 0.1–1.0)
Monocytes Relative: 9 % (ref 3–12)
Neutro Abs: 2.7 10*3/uL (ref 1.7–7.7)
Neutrophils Relative %: 42 % — ABNORMAL LOW (ref 43–77)
Platelets: 170 10*3/uL (ref 150–400)
RBC: 5.05 MIL/uL (ref 3.87–5.11)
RDW: 13.1 % (ref 11.5–15.5)
WBC: 6.4 10*3/uL (ref 4.0–10.5)

## 2013-07-23 NOTE — Patient Instructions (Signed)
[facility] Discharge Instructions  RECOMMENDATIONS MADE BY THE CONSULTANT AND ANY TEST RESULTS WILL BE SENT TO YOUR REFERRING PHYSICIAN. Return in 6 months for a follow up.  Thank you for choosing [facility] to provide your oncology and hematology care.  To afford each patient quality time with our providers, please arrive at least 15 minutes before your scheduled appointment time.  With your help, our goal is to use those 15 minutes to complete the necessary work-up to ensure our physicians have the information they need to help with your evaluation and healthcare recommendations.    Effective January 1st, 2014, we ask that you re-schedule your appointment with our physicians should you arrive 10 or more minutes late for your appointment.  We strive to give you quality time with our providers, and arriving late affects you and other patients whose appointments are after yours.    Again, thank you for choosing [facility].  Our hope is that these requests will decrease the amount of time that you wait before being seen by our physicians.       _____________________________________________________________  Should you have questions after your visit to [facility], please contact our office at [phone] between the hours of 8:30 a.m. and 5:00 p.m.  Voicemails left after 4:30 p.m. will not be returned until the following business day.  For prescription refill requests, have your pharmacy contact our office with your prescription refill request.

## 2013-07-23 NOTE — Progress Notes (Signed)
Labs drawn from left ac, 23 gauge butterfly.  Pt tolerated well.  

## 2013-07-25 ENCOUNTER — Ambulatory Visit (HOSPITAL_COMMUNITY): Payer: Medicare Other

## 2013-08-08 ENCOUNTER — Ambulatory Visit: Payer: Medicare Other | Admitting: Neurology

## 2013-08-08 ENCOUNTER — Telehealth: Payer: Self-pay | Admitting: Neurology

## 2013-08-08 NOTE — Telephone Encounter (Signed)
This patient did not show for a revisit appointment today. 

## 2013-08-13 ENCOUNTER — Encounter: Payer: Self-pay | Admitting: Oncology

## 2013-09-06 LAB — ERYTHROPOIETIN: Erythropoietin: 9.4 m[IU]/mL (ref 2.6–18.5)

## 2013-09-06 LAB — CANCER ANTIGEN 27.29: CA 27.29: 24 U/mL (ref 0–39)

## 2013-09-16 DIAGNOSIS — S61509A Unspecified open wound of unspecified wrist, initial encounter: Secondary | ICD-10-CM | POA: Diagnosis not present

## 2013-09-16 DIAGNOSIS — S91109A Unspecified open wound of unspecified toe(s) without damage to nail, initial encounter: Secondary | ICD-10-CM | POA: Diagnosis not present

## 2013-09-16 LAB — JAK2 EXONS 12 & 13 MUTATION, REFLEX
JAK2 exon 12 mutation: NOT DETECTED
JAK2 exon 13 mutation: NOT DETECTED

## 2013-09-16 LAB — JAK2 GENOTYPR: JAK2 GenotypR: NOT DETECTED

## 2013-09-23 ENCOUNTER — Ambulatory Visit (INDEPENDENT_AMBULATORY_CARE_PROVIDER_SITE_OTHER): Payer: Medicare Other | Admitting: Neurology

## 2013-09-23 ENCOUNTER — Encounter: Payer: Self-pay | Admitting: Neurology

## 2013-09-23 VITALS — BP 134/84 | HR 79 | Wt 147.0 lb

## 2013-09-23 DIAGNOSIS — G40209 Localization-related (focal) (partial) symptomatic epilepsy and epileptic syndromes with complex partial seizures, not intractable, without status epilepticus: Secondary | ICD-10-CM

## 2013-09-23 DIAGNOSIS — Z5181 Encounter for therapeutic drug level monitoring: Secondary | ICD-10-CM | POA: Diagnosis not present

## 2013-09-23 DIAGNOSIS — R413 Other amnesia: Secondary | ICD-10-CM

## 2013-09-23 NOTE — Patient Instructions (Signed)
Epilepsy Epilepsy is a disorder in which a person has repeated seizures over time. A seizure is a release of abnormal electrical activity in the brain. Seizures can cause a change in attention, behavior, or the ability to remain awake and alert (altered mental status). Seizures often involve uncontrollable shaking (convulsions).  Most people with epilepsy lead normal lives. However, people with epilepsy are at an increased risk of falls, accidents, and injuries. Therefore, it is important to begin treatment right away. CAUSES  Epilepsy has many possible causes. Anything that disturbs the normal pattern of brain cell activity can lead to seizures. This may include:   Head injury.  Birth trauma.  High fever as a child.  Stroke.  Bleeding into or around the brain.  Certain drugs.  Prolonged low oxygen, such as what occurs after CPR efforts.  Abnormal brain development.  Certain illnesses, such as meningitis, encephalitis (brain infection), malaria, and other infections.  An imbalance of nerve signaling chemicals (neurotransmitters).  SIGNS AND SYMPTOMS  The symptoms of a seizure can vary greatly from one person to another. Right before a seizure, you may have a warning (aura) that a seizure is about to occur. An aura may include the following symptoms:  Fear or anxiety.  Nausea.  Feeling like the room is spinning (vertigo).  Vision changes, such as seeing flashing lights or spots. Common symptoms during a seizure include:  Abnormal sensations, such as an abnormal smell or a bitter taste in the mouth.   Sudden, general body stiffness.   Convulsions that involve rhythmic jerking of the face, arm, or leg on one or both sides.   Sudden change in consciousness.   Appearing to be awake but not responding.   Appearing to be asleep but cannot be awakened.   Grimacing, chewing, lip smacking, drooling, tongue biting, or loss of bowel or bladder control. After a seizure,  you may feel sleepy for a while. DIAGNOSIS  Your health care provider will ask about your symptoms and take a medical history. Descriptions from any witnesses to your seizures will be very helpful in the diagnosis. A physical exam, including a detailed neurological exam, is necessary. Various tests may be done, such as:   An electroencephalogram (EEG). This is a painless test of your brain waves. In this test, a diagram is created of your brain waves. These diagrams can be interpreted by a specialist.  An MRI of the brain.   A CT scan of the brain.   A spinal tap (lumbar puncture, LP).  Blood tests to check for signs of infection or abnormal blood chemistry. TREATMENT  There is no cure for epilepsy, but it is generally treatable. Once epilepsy is diagnosed, it is important to begin treatment as soon as possible. For most people with epilepsy, seizures can be controlled with medicines. The following may also be used:  A pacemaker for the brain (vagus nerve stimulator) can be used for people with seizures that are not well controlled by medicine.  Surgery on the brain. For some people, epilepsy eventually goes away. HOME CARE INSTRUCTIONS   Follow your health care provider's recommendations on driving and safety in normal activities.  Get enough rest. Lack of sleep can cause seizures.  Only take over-the-counter or prescription medicines as directed by your health care provider. Take any prescribed medicine exactly as directed.  Avoid any known triggers of your seizures.  Keep a seizure diary. Record what you recall about any seizure, especially any possible trigger.   Make   sure the people you live and work with know that you are prone to seizures. They should receive instructions on how to help you. In general, a witness to a seizure should:   Cushion your head and body.   Turn you on your side.   Avoid unnecessarily restraining you.   Not place anything inside your  mouth.   Call for emergency medical help if there is any question about what has occurred.   Follow up with your health care provider as directed. You may need regular blood tests to monitor the levels of your medicine.  SEEK MEDICAL CARE IF:   You develop signs of infection or other illness. This might increase the risk of a seizure.   You seem to be having more frequent seizures.   Your seizure pattern is changing.  SEEK IMMEDIATE MEDICAL CARE IF:   You have a seizure that does not stop after a few moments.   You have a seizure that causes any difficulty in breathing.   You have a seizure that results in a very severe headache.   You have a seizure that leaves you with the inability to speak or use a part of your body.  Document Released: 04/04/2005 Document Revised: 01/23/2013 Document Reviewed: 11/14/2012 [employer] Patient Information 2014 [employer].  

## 2013-09-23 NOTE — Progress Notes (Signed)
Reason for visit: Seizures  Wendy Lopez is an 53 y.o. female  History of present illness:  Wendy Lopez is a 53 year old right-handed white female with a history of seizures. The patient has been on Lamictal and Dilantin. Since last seen, her Dilantin level was in the low toxic range at 21. She was lowered on the dosing of the Dilantin, and since that time, she feels that she has done better with the vision. The patient no longer seen a purple haze. She indicates that her cognitive abilities have improved, and her gait stability has improved. The patient has not had any further amnestic events. She is now back to driving a motor vehicle. The patient reports no other new medical issues since last seen.  Past Medical History  Diagnosis Date  . Spinal fusion failure     3  . Mitral valve prolapse 06/01/10    echo- EF>55% mild tricupsid regurgitation. There is mild Pulmonary Hypertension.. Right  ventricular systolic pressure iselevated at 30-40 mmHg. LV systolic function is normal.  . Arthritis   . Breast cancer 02/13/2009  . Dysrhythmia     palpitations  . H/O hiatal hernia   . Epilepsy     last seizure 1990's  . PONV (postoperative nausea and vomiting)   . Osteopenia   . Depression   . Cancer of right breast 04/16/2012  . Depression 2014  . SOB (shortness of breath) 10/18/11    met-test  . Memory loss   . Anxiety and depression   . Localization-related (focal) (partial) epilepsy and epileptic syndromes with complex partial seizures, without mention of intractable epilepsy 02/07/2013  . Memory deficit 02/07/2013    Past Surgical History  Procedure Laterality Date  . Tonsillectomy    . Spine surgery  1999, 2003, 2005  . Hand tendon surgery  2012    tendons released in right hand   . Mastectomy, radical  02/13/2009  . Back surgery      screws- lumbar  . Breast surgery  2010    mastectomy - RIGHT  . Colonoscopy with propofol  03/13/2012    Procedure: COLONOSCOPY  WITH PROPOFOL;  Surgeon: Garlan Fair, MD;  Location: [facility];  Service: Endoscopy;  Laterality: N/A;  . Esophagogastroduodenoscopy  03/13/2012    Procedure: ESOPHAGOGASTRODUODENOSCOPY (EGD);  Surgeon: Garlan Fair, MD;  Location: [facility];  Service: Endoscopy;  Laterality: N/A;  Reflux, Hiatal Hernia    Family History  Problem Relation Age of Onset  . Arthritis Mother     rhuematoid  . Heart disease Mother     Atrial fib  . Heart disease Father   . Heart disease Sister     Atrial fib    Social history:  reports that she has been smoking Cigarettes.  She has been smoking about 0.50 packs per day. She has never used smokeless tobacco. She reports that she does not drink alcohol or use illicit drugs.    Allergies  Allergen Reactions  . Carbamazepine Anaphylaxis  . Other     STATES HAS SHORTNESS OF BREATH WITH GENERIC DRUGS     ALSO RASH AND ITCHING  . Pseudoephedrine Hcl Er Other (See Comments)    SEIZURES.  . Valium Other (See Comments)    Hallucinations, spasticity, hyper-relfexive   . Oxycodone Nausea And Vomiting and Rash  . Prednisone Rash and Other (See Comments)    Prescribing MD told patient he thinks dose (dose pack) was "just too high." 03/09/11   STATES  HAS TAKEN SMALLER DOSE AND STILL HAD RASH AND JITTERINESS  . Ibuprofen Swelling  . Percocet [Oxycodone-Acetaminophen] Nausea And Vomiting  . Iodine Rash    SWELLING -  TOPICAL PAINT  . Latex Dermatitis and Rash    Medications:  Current Outpatient Prescriptions on File Prior to Visit  Medication Sig Dispense Refill  . aspirin-acetaminophen-caffeine (EXCEDRIN EXTRA STRENGTH) 250-250-65 MG per tablet Take 1 tablet by mouth every 6 (six) hours as needed. Pain      . cetirizine (ZYRTEC) 10 MG tablet Take 10 mg by mouth daily.      . cyanocobalamin 100 MCG tablet Take 100 mcg by mouth daily.      Marland Kitchen HYDROcodone-acetaminophen (NORCO/VICODIN) 5-325 MG per tablet Take 1 tablet by mouth every 6 (six) hours  as needed for pain.      Marland Kitchen LAMICTAL 100 MG tablet Take 0.5 tablets (50 mg total) by mouth 2 (two) times daily. With 25m tab for a total of 2555mtwice daily Brand Medically Necessary  30 tablet  6  . lamoTRIgine (LAMICTAL) 200 MG tablet Take 1 tablet (200 mg total) by mouth 2 (two) times daily. With one half of 10017mab to total 250m68mice daily . Brand Medically Necessary  60 tablet  6  . metoprolol succinate (TOPROL XL) 100 MG 24 hr tablet Take 1 &1/2 tablets by mouth every day.Take with or immediately following a meal.  45 tablet  7  . phenytoin (DILANTIN) 100 MG ER capsule Take 100 mg by mouth 3 (three) times daily. Brand is medically necessary      . vitamin C (ASCORBIC ACID) 500 MG tablet Take 500 mg by mouth daily.       No current facility-administered medications on file prior to visit.    ROS:  Out of a complete 14 system review of symptoms, the patient complains only of the following symptoms, and all other reviewed systems are negative.  Recent laceration to the left foot  Blood pressure 134/84, pulse 79, weight 147 lb (66.679 kg), last menstrual period 11/03/1993.  Physical Exam  General: The patient is alert and cooperative at the time of the examination.  Skin: No significant peripheral edema is noted.   Neurologic Exam  Mental status: The patient is oriented x 3.  Cranial nerves: Facial symmetry is present. Speech is normal, no aphasia or dysarthria is noted. Extraocular movements are full. Visual fields are full.  Motor: The patient has good strength in all 4 extremities.  Sensory examination: Soft touch sensation is symmetric on the face, arms, and legs.  Coordination: The patient has good finger-nose-finger and heel-to-shin bilaterally.  Gait and station: The patient has a normal gait. Tandem gait is slightly unsteady in. Romberg is negative. No drift is seen.  Reflexes: Deep tendon reflexes are symmetric.   Assessment/Plan:  1. History  seizures  2. History depression  The patient is doing better at this time on the current dosing. The patient will have blood work done today looking at the Dilantin level and at the lamotrigine level. She will followup through this office in about 6 months.  C. KJill Alexanders6/11/2013 8:46 PM  [facility] [address] [phone]

## 2013-09-24 LAB — PHENYTOIN LEVEL, TOTAL: Phenytoin Lvl: 11.3 ug/mL (ref 10.0–20.0)

## 2013-09-24 LAB — LAMOTRIGINE LEVEL: Lamotrigine Lvl: 7.2 ug/mL (ref 2.0–20.0)

## 2013-09-25 NOTE — Progress Notes (Signed)
Quick Note:  Shared lab results with patient per Dr Tobey Grim findings, she verbalized understanding ______

## 2013-09-30 DIAGNOSIS — C50919 Malignant neoplasm of unspecified site of unspecified female breast: Secondary | ICD-10-CM | POA: Diagnosis not present

## 2013-09-30 DIAGNOSIS — I10 Essential (primary) hypertension: Secondary | ICD-10-CM | POA: Diagnosis not present

## 2013-09-30 DIAGNOSIS — F17200 Nicotine dependence, unspecified, uncomplicated: Secondary | ICD-10-CM | POA: Diagnosis not present

## 2013-09-30 DIAGNOSIS — R569 Unspecified convulsions: Secondary | ICD-10-CM | POA: Diagnosis not present

## 2013-09-30 DIAGNOSIS — T148XXA Other injury of unspecified body region, initial encounter: Secondary | ICD-10-CM | POA: Diagnosis not present

## 2013-09-30 DIAGNOSIS — R002 Palpitations: Secondary | ICD-10-CM | POA: Diagnosis not present

## 2013-11-15 DIAGNOSIS — J019 Acute sinusitis, unspecified: Secondary | ICD-10-CM | POA: Diagnosis not present

## 2013-11-15 DIAGNOSIS — R42 Dizziness and giddiness: Secondary | ICD-10-CM | POA: Diagnosis not present

## 2013-11-18 ENCOUNTER — Other Ambulatory Visit: Payer: Self-pay | Admitting: Neurology

## 2013-12-31 ENCOUNTER — Ambulatory Visit: Payer: Medicare Other | Admitting: Neurology

## 2014-01-22 ENCOUNTER — Ambulatory Visit (HOSPITAL_COMMUNITY): Payer: Medicare Other | Admitting: Oncology

## 2014-01-30 ENCOUNTER — Other Ambulatory Visit: Payer: Self-pay | Admitting: Neurology

## 2014-01-30 ENCOUNTER — Other Ambulatory Visit: Payer: Self-pay

## 2014-01-30 ENCOUNTER — Other Ambulatory Visit: Payer: Self-pay | Admitting: Cardiovascular Disease

## 2014-01-30 MED ORDER — METOPROLOL SUCCINATE ER 100 MG PO TB24: ORAL_TABLET | ORAL | Status: DC

## 2014-01-30 NOTE — Telephone Encounter (Signed)
Rx sent to pharmacy. *pt needs appointment for further refills.*

## 2014-01-30 NOTE — Telephone Encounter (Signed)
Rx was sent to pharmacy electronically. 

## 2014-01-31 ENCOUNTER — Other Ambulatory Visit: Payer: Self-pay

## 2014-01-31 DIAGNOSIS — Z23 Encounter for immunization: Secondary | ICD-10-CM | POA: Diagnosis not present

## 2014-02-06 DIAGNOSIS — M47816 Spondylosis without myelopathy or radiculopathy, lumbar region: Secondary | ICD-10-CM | POA: Diagnosis not present

## 2014-02-06 DIAGNOSIS — M4302 Spondylolysis, cervical region: Secondary | ICD-10-CM | POA: Diagnosis not present

## 2014-03-11 DIAGNOSIS — J019 Acute sinusitis, unspecified: Secondary | ICD-10-CM | POA: Diagnosis not present

## 2014-03-15 NOTE — Progress Notes (Signed)
Wendy Lopez, [address] [employer], [address] [place] [address]  Cancer of right breast - Plan: CBC with Differential, Comprehensive metabolic panel, CBC with Differential, Comprehensive metabolic panel  Tobacco abuse  Breast nodule  CURRENT THERAPY: Surveillance per NCCN guideline  INTERVAL HISTORY: Wendy Lopez 53 y.o. female returns for  regular  visit for followup of breast cancer, stage I, ER +99%, PR +54%, Ki-67 marker 15%, HER-2/neu nonamplified. Oncotype score was intermediate and she was offered chemotherapy. All 3 nodes were negative. She took 3 cycles out of 4 of Taxotere/Cytoxan under the care of Dr. Sherryl Manges but stopped due to intolerance due to her dose being based upon the wrong weight (198 lbs instead of 142 lbs) and she subsequently became very toxic from this treatment.  She was then placed on Arimidex which she stopped due to intolerance.    I personally reviewed and went over laboratory results with the patient.  The results are noted within this dictation.  She has a negative work-up for erythrocytosis and therefore, this is lung disease-induced.  I personally reviewed and went over radiographic studies with the patient.  The results are noted within this dictation.  Last mammogram performed on 03/08/2013 was BIRADS 1 and she is due for mammography this month and that is scheduled for 03/21/2014.  She notes a left breast nodule that she has noted in the past.  She has a difficult time finding it today on exam, but she reports that it is the same size as last year.  She finally finds an area that concerns her, but she is not sure if that is the particular spot.  I felt a small 0.5 cm soft, mobile area that is deep in the 2 o'clock position approximately 6 cm from the nipple.  It feels benign to me.  She has a mammogram on Thursday and I encouraged her to point this out to the technician and radiologist.  She may benefit from [place] as well as mammogram.  I have  reviewed the patient's pathology from her 53 year old breast cancer.  She is educated about ER/PR.  I offered her a trial of Tamoxifen and I reviewed the role of this medication is to protect the opposite breast.  I reviewed the risks, benefits, alternatives, and side effects of the medication including arthralgia, myalgias, hot flashes, increased risk of VTE, and increased risk of secondary malignancy of GU tract.  She declined this intervention which is reasonable given her history and being 5 years out from cancer.  I would be concerned about her increased risk of VTE because of her smoking, erythrocytosis secondary to smoking, and other risk factors for VTE.  I would not want to contribute to an increased risk of VTE.   Otherwise, oncologically, she denies any complaints and ROS questioning is negative.     Past Medical History  Diagnosis Date  . Spinal fusion failure     3  . Mitral valve prolapse 06/01/10    echo- EF>55% mild tricupsid regurgitation. There is mild Pulmonary Hypertension.. Right  ventricular systolic pressure iselevated at 30-40 mmHg. LV systolic function is normal.  . Arthritis   . Breast cancer 02/13/2009  . Dysrhythmia     palpitations  . H/O hiatal hernia   . Epilepsy     last seizure 1990's  . PONV (postoperative nausea and vomiting)   . Osteopenia   . Depression   . Cancer of right breast 04/16/2012  . Depression  2014  . SOB (shortness of breath) 10/18/11    met-test  . Memory loss   . Anxiety and depression   . Localization-related (focal) (partial) epilepsy and epileptic syndromes with complex partial seizures, without mention of intractable epilepsy 02/07/2013  . Memory deficit 02/07/2013    has Cancer of right breast; HTN (hypertension); Lumbar disc disease; Tobacco abuse; Localization-related (focal) (partial) epilepsy and epileptic syndromes with complex partial seizures, without mention of intractable epilepsy; and Memory deficit on her problem list.       is allergic to carbamazepine; other; pseudoephedrine hcl er; valium; oxycodone; prednisone; ibuprofen; percocet; iodine; and latex.  Wendy Lopez had no medications administered during this visit.  Past Surgical History  Procedure Laterality Date  . Tonsillectomy    . Spine surgery  1999, 2003, 2005  . Hand tendon surgery  2012    tendons released in right hand   . Mastectomy, radical  02/13/2009  . Back surgery      screws- lumbar  . Breast surgery  2010    mastectomy - RIGHT  . Colonoscopy with propofol  03/13/2012    Procedure: COLONOSCOPY WITH PROPOFOL;  Surgeon: Garlan Fair, MD;  Location: [facility];  Service: Endoscopy;  Laterality: N/A;  . Esophagogastroduodenoscopy  03/13/2012    Procedure: ESOPHAGOGASTRODUODENOSCOPY (EGD);  Surgeon: Garlan Fair, MD;  Location: [facility];  Service: Endoscopy;  Laterality: N/A;  Reflux, Hiatal Hernia    Denies any headaches, dizziness, double vision, fevers, chills, night sweats, nausea, vomiting, diarrhea, constipation, chest pain, heart palpitations, shortness of breath, blood in stool, black tarry stool, urinary pain, urinary burning, urinary frequency, hematuria.   PHYSICAL EXAMINATION  ECOG PERFORMANCE STATUS: 0 - Asymptomatic  Filed Vitals:   03/18/14 1322  BP: 153/87  Pulse: 66  Temp: 98.3 F (36.8 C)  Resp: 18    GENERAL:alert, no distress, well nourished, well developed, comfortable, cooperative, smiling and thickened facial skin from smoking SKIN: skin color, texture, turgor are normal, no rashes or significant lesions HEAD: Normocephalic, No masses, lesions, tenderness or abnormalities EYES: normal, PERRLA, EOMI, Conjunctiva are pink and non-injected EARS: External ears normal OROPHARYNX:lips, buccal mucosa, and tongue normal and mucous membranes are moist  NECK: supple, no adenopathy, thyroid normal size, non-tender, without nodularity, no stridor, non-tender, trachea midline LYMPH:  no palpable  lymphadenopathy, no hepatosplenomegaly BREAST: left breast normal without obvious mass, skin or nipple changes or axillary nodes, but with a small 0.5 cm nodule in the 2 o'clock position about 6 cm from nipple that is deep, soft, and mobile, right post-mastectomy site well healed and free of suspicious changes LUNGS: clear to auscultation and percussion, decreased breath sounds HEART: regular rate & rhythm, no murmurs, no gallops, S1 normal and S2 normal ABDOMEN:abdomen soft, non-tender, normal bowel sounds and no masses or organomegaly BACK: Back symmetric, no curvature. EXTREMITIES:less then 2 second capillary refill, no joint deformities, effusion, or inflammation, no edema, no skin discoloration, no cyanosis  NEURO: alert & oriented x 3 with fluent speech, no focal motor/sensory deficits, gait normal   LABORATORY DATA: CBC    Component Value Date/Time   WBC 6.4 07/23/2013 1603   WBC 7.6 02/07/2013 1004   WBC 6.2 12/28/2009 0941   RBC 5.05 07/23/2013 1603   RBC 5.42* 02/07/2013 1004   RBC 5.07 12/28/2009 0941   HGB 15.4* 07/23/2013 1603   HGB 16.4* 12/28/2009 0941   HCT 44.7 07/23/2013 1603   HCT 47.1* 12/28/2009 0941   PLT 170 07/23/2013 1603  PLT 170 12/28/2009 0941   MCV 88.5 07/23/2013 1603   MCV 92.8 12/28/2009 0941   MCH 30.5 07/23/2013 1603   MCH 31.5 02/07/2013 1004   MCH 32.3 12/28/2009 0941   MCHC 34.5 07/23/2013 1603   MCHC 35.1 02/07/2013 1004   MCHC 34.8 12/28/2009 0941   RDW 13.1 07/23/2013 1603   RDW 13.1 02/07/2013 1004   RDW 12.8 12/28/2009 0941   LYMPHSABS 2.8 07/23/2013 1603   LYMPHSABS 2.6 02/07/2013 1004   LYMPHSABS 2.0 12/28/2009 0941   MONOABS 0.6 07/23/2013 1603   MONOABS 0.6 12/28/2009 0941   EOSABS 0.2 07/23/2013 1603   EOSABS 0.2 02/07/2013 1004   BASOSABS 0.1 07/23/2013 1603   BASOSABS 0.1 02/07/2013 1004   BASOSABS 0.1 12/28/2009 0941      Chemistry      Component Value Date/Time   NA 143 07/23/2013 1603   NA 145* 02/07/2013 1004     K 4.3 07/23/2013 1603   CL 104 07/23/2013 1603   CO2 28 07/23/2013 1603   BUN 9 07/23/2013 1603   BUN 10 02/07/2013 1004   CREATININE 0.83 07/23/2013 1603      Component Value Date/Time   CALCIUM 9.2 07/23/2013 1603   ALKPHOS 131* 07/23/2013 1603   AST 21 07/23/2013 1603   ALT 12 07/23/2013 1603   BILITOT 0.2* 07/23/2013 1603     Lab Results  Component Value Date   LABCA2 24 07/23/2013    Results for Wendy Lopez, Wendy Lopez (MRN [id number]) as of 03/15/2014 13:04  Ref. Range 07/23/2013 16:03  JAK2 GenotypR No range found Not Detected  Specimen source - RFXJAK No range found Blood  JAK2 exon 12 mutation No range found NOT DETECTED  JAK2 exon 13 mutation No range found NOT DETECTED      ASSESSMENT:  1. Breast cancer, stage I, ER +99%, PR +54%, Ki-67 marker 15%, HER-2/neu nonamplified. Oncotype score was intermediate and she was offered chemotherapy. All 3 nodes were negative. She took 3 cycles of Taxotere/Cytoxan but stopped due to intolerance, she was placed on Arimidex which she stopped due to intolerance. 2. 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  3. Tobacco abuse, still smoking 1/2- 3/4 ppd. 4. Breast nodule in 2 o'clock position of left breast about 6 cm from nipple measuring 0.5 cm in size, soft, and mobile.  Patient Active Problem List   Diagnosis Date Noted  . Localization-related (focal) (partial) epilepsy and epileptic syndromes with complex partial seizures, without mention of intractable epilepsy 02/07/2013  . Memory deficit 02/07/2013  . HTN (hypertension) 11/25/2012  . Lumbar disc disease 11/25/2012  . Tobacco abuse 11/25/2012  . Cancer of right breast 04/16/2012     PLAN:  1. I personally reviewed and went over laboratory results with the patient.  The results are noted within this dictation. 2. Oncology history updated, but I lack dates from chemotherapy and Arimidex because she was treated by Dr. Lamonte Sakai in [place]  and I do not have records. 3. Mammogram is due this month, scheduled for 03/20/2014.  4. Labs today: CBC diff, CMET 5. Smoking cessation education provided 6. Patient education regarding the option of treatment with Tamoxifen.  Risks, benefits, alternatives, and side effects of therapy discussed.  Patient declined. 7. Return in 1 year for follow-up   THERAPY PLAN:  NCCN guidelines recommends the following surveillance for invasive breast cancer:  A. History and Physical exam every 4-6 months for 5 years and then every 12 months.  B.  Mammography every 12 months  C. Women on Tamoxifen: annual gynecologic assessment every 12 months if uterus is present.  D. Women on aromatase inhibitor or who experience ovarian failure secondary to treatment should have monitoring of bone health with a bone mineral density determination at baseline and periodically thereafter.  E. Assess and encourage adherence to adjuvant endocrine therapy.  F. Evidence suggests that active lifestyle and achieving and maintaining an ideal body weight (20-25 BMI) may lead to optimal breast cancer outcomes.   All questions were answered. The patient knows to call the clinic with any problems, questions or concerns. We can certainly see the patient much sooner if necessary.  Patient and plan discussed with Dr. Farrel Gobble and he is in agreement with the aforementioned.   Shemekia Patane 03/18/2014

## 2014-03-18 ENCOUNTER — Encounter (HOSPITAL_COMMUNITY): Payer: Medicare Other | Attending: Oncology | Admitting: Oncology

## 2014-03-18 ENCOUNTER — Encounter (HOSPITAL_COMMUNITY): Payer: Self-pay | Admitting: Oncology

## 2014-03-18 VITALS — BP 153/87 | HR 66 | Temp 98.3°F | Resp 18 | Wt 148.7 lb

## 2014-03-18 DIAGNOSIS — Z9011 Acquired absence of right breast and nipple: Secondary | ICD-10-CM | POA: Insufficient documentation

## 2014-03-18 DIAGNOSIS — Z853 Personal history of malignant neoplasm of breast: Secondary | ICD-10-CM | POA: Diagnosis not present

## 2014-03-18 DIAGNOSIS — F17210 Nicotine dependence, cigarettes, uncomplicated: Secondary | ICD-10-CM | POA: Diagnosis not present

## 2014-03-18 DIAGNOSIS — C50911 Malignant neoplasm of unspecified site of right female breast: Secondary | ICD-10-CM

## 2014-03-18 DIAGNOSIS — D751 Secondary polycythemia: Secondary | ICD-10-CM | POA: Diagnosis not present

## 2014-03-18 DIAGNOSIS — N63 Unspecified lump in unspecified breast: Secondary | ICD-10-CM

## 2014-03-18 DIAGNOSIS — G40209 Localization-related (focal) (partial) symptomatic epilepsy and epileptic syndromes with complex partial seizures, not intractable, without status epilepticus: Secondary | ICD-10-CM | POA: Diagnosis not present

## 2014-03-18 DIAGNOSIS — Z720 Tobacco use: Secondary | ICD-10-CM | POA: Diagnosis not present

## 2014-03-18 DIAGNOSIS — Z79891 Long term (current) use of opiate analgesic: Secondary | ICD-10-CM | POA: Insufficient documentation

## 2014-03-18 DIAGNOSIS — Z08 Encounter for follow-up examination after completed treatment for malignant neoplasm: Secondary | ICD-10-CM | POA: Insufficient documentation

## 2014-03-18 DIAGNOSIS — I10 Essential (primary) hypertension: Secondary | ICD-10-CM | POA: Insufficient documentation

## 2014-03-18 DIAGNOSIS — Z9221 Personal history of antineoplastic chemotherapy: Secondary | ICD-10-CM | POA: Insufficient documentation

## 2014-03-18 LAB — COMPREHENSIVE METABOLIC PANEL
ALT: 11 U/L (ref 0–35)
AST: 15 U/L (ref 0–37)
Albumin: 4 g/dL (ref 3.5–5.2)
Alkaline Phosphatase: 133 U/L — ABNORMAL HIGH (ref 39–117)
Anion gap: 13 (ref 5–15)
BUN: 9 mg/dL (ref 6–23)
CO2: 26 mEq/L (ref 19–32)
Calcium: 9.1 mg/dL (ref 8.4–10.5)
Chloride: 102 mEq/L (ref 96–112)
Creatinine, Ser: 0.77 mg/dL (ref 0.50–1.10)
GFR calc Af Amer: >90 mL/min (ref >90)
GFR calc non Af Amer: >90 mL/min (ref >90)
Glucose, Bld: 86 mg/dL (ref 70–99)
Potassium: 4.2 mEq/L (ref 3.7–5.3)
Sodium: 141 mEq/L (ref 137–147)
Total Bilirubin: 0.3 mg/dL (ref 0.3–1.2)
Total Protein: 7.1 g/dL (ref 6.0–8.3)

## 2014-03-18 LAB — CBC WITH DIFFERENTIAL/PLATELET
Basophils Absolute: 0.1 10*3/uL (ref 0.0–0.1)
Basophils Relative: 1 % (ref 0–1)
Eosinophils Absolute: 0.2 10*3/uL (ref 0.0–0.7)
Eosinophils Relative: 2 % (ref 0–5)
HCT: 46.8 % — ABNORMAL HIGH (ref 36.0–46.0)
Hemoglobin: 16.5 g/dL — ABNORMAL HIGH (ref 12.0–15.0)
Lymphocytes Relative: 43 % (ref 12–46)
Lymphs Abs: 3.4 10*3/uL (ref 0.7–4.0)
MCH: 31 pg (ref 26.0–34.0)
MCHC: 35.3 g/dL (ref 30.0–36.0)
MCV: 88 fL (ref 78.0–100.0)
Monocytes Absolute: 0.7 10*3/uL (ref 0.1–1.0)
Monocytes Relative: 9 % (ref 3–12)
Neutro Abs: 3.7 10*3/uL (ref 1.7–7.7)
Neutrophils Relative %: 45 % (ref 43–77)
Platelets: 184 10*3/uL (ref 150–400)
RBC: 5.32 MIL/uL — ABNORMAL HIGH (ref 3.87–5.11)
RDW: 13.3 % (ref 11.5–15.5)
WBC: 8 10*3/uL (ref 4.0–10.5)

## 2014-03-18 NOTE — Patient Instructions (Signed)
[facility] Discharge Instructions  RECOMMENDATIONS MADE BY THE CONSULTANT AND ANY TEST RESULTS WILL BE SENT TO YOUR REFERRING PHYSICIAN.  We will ascertain labs today.  We will get these results tomorrow (03/19/2014). Mammogram as scheduled this week.  Please show the radiologist the area in breast that is of concern and request an [place].   Return in 12 months for follow-up.  We discussed the option of Tamoxifen and you have declined at this time.  Thank you for choosing [facility] to provide your oncology and hematology care.  To afford each patient quality time with our providers, please arrive at least 15 minutes before your scheduled appointment time.  With your help, our goal is to use those 15 minutes to complete the necessary work-up to ensure our physicians have the information they need to help with your evaluation and healthcare recommendations.    Effective January 1st, 2014, we ask that you re-schedule your appointment with our physicians should you arrive 10 or more minutes late for your appointment.  We strive to give you quality time with our providers, and arriving late affects you and other patients whose appointments are after yours.    Again, thank you for choosing [facility].  Our hope is that these requests will decrease the amount of time that you wait before being seen by our physicians.       _____________________________________________________________  Should you have questions after your visit to [facility], please contact our office at [phone] between the hours of 8:30 a.m. and 5:00 p.m.  Voicemails left after 4:30 p.m. will not be returned until the following business day.  For prescription refill requests, have your pharmacy contact our office with your prescription refill request.

## 2014-03-20 DIAGNOSIS — Z1231 Encounter for screening mammogram for malignant neoplasm of breast: Secondary | ICD-10-CM | POA: Diagnosis not present

## 2014-03-20 DIAGNOSIS — Z853 Personal history of malignant neoplasm of breast: Secondary | ICD-10-CM | POA: Diagnosis not present

## 2014-03-20 DIAGNOSIS — M8580 Other specified disorders of bone density and structure, unspecified site: Secondary | ICD-10-CM | POA: Diagnosis not present

## 2014-03-25 ENCOUNTER — Encounter: Payer: Self-pay | Admitting: Nurse Practitioner

## 2014-03-25 ENCOUNTER — Ambulatory Visit (INDEPENDENT_AMBULATORY_CARE_PROVIDER_SITE_OTHER): Payer: Medicare Other | Admitting: Nurse Practitioner

## 2014-03-25 VITALS — BP 139/85 | HR 69 | Ht 62.0 in | Wt 148.0 lb

## 2014-03-25 DIAGNOSIS — G40209 Localization-related (focal) (partial) symptomatic epilepsy and epileptic syndromes with complex partial seizures, not intractable, without status epilepticus: Secondary | ICD-10-CM | POA: Diagnosis not present

## 2014-03-25 DIAGNOSIS — Z5181 Encounter for therapeutic drug level monitoring: Secondary | ICD-10-CM | POA: Diagnosis not present

## 2014-03-25 DIAGNOSIS — R413 Other amnesia: Secondary | ICD-10-CM | POA: Diagnosis not present

## 2014-03-25 NOTE — Patient Instructions (Signed)
Will check level of the Lamictal and Dilantin today Follow-up in 6 months Call for any seizure activity

## 2014-03-25 NOTE — Progress Notes (Signed)
[facility]  PATIENT: Wendy Lopez DOB: 05/19/1960   REASON FOR VISIT: Follow-up for seizure disorder and mild memory deficit    HISTORY OF PRESENT ILLNESS:Wendy Lopez is a 53 year old right-handed white female with a history of seizures. She was last seen in this office by Dr. Jannifer Franklin 09/23/2013. The patient has been on Lamictal and Dilantin.  Her Dilantin level was decreased to 300 mg at bedtime  and since that time, she feels that she has done better with the vision. The patient no longer seen a purple haze. She indicates that her cognitive abilities have improved, and her gait stability has improved. The patient has not had any further amnestic events. She is now back to driving a motor vehicle. The patient reports no other new medical issues since last seen. She returns for reevaluation and labs. She reports that she is currently on brand Lamictal but will no longer be able to afford the cost when she hits the doughnut hole. She will switch to generic at her next refill.  REVIEW OF SYSTEMS: Full 14 system review of systems performed and notable only for those listed, all others are neg:  Constitutional: N/A  Cardiovascular: N/A  Ear/Nose/Throat: N/A  Skin: N/A  Eyes: N/A  Respiratory: N/A  Gastroitestinal: N/A  Hematology/Lymphatic: N/A  Endocrine: N/A Musculoskeletal: Back pain Allergy/Immunology: N/A  Neurological: N/A Psychiatric: Confusion Sleep : Restless legs   ALLERGIES: Allergies  Allergen Reactions  . Carbamazepine Anaphylaxis  . Other     STATES HAS SHORTNESS OF BREATH WITH GENERIC DRUGS     ALSO RASH AND ITCHING  . Pseudoephedrine Hcl Er Other (See Comments)    SEIZURES.  . Valium Other (See Comments)    Hallucinations, spasticity, hyper-relfexive   . Oxycodone Nausea And Vomiting and Rash  . Prednisone Rash and Other (See Comments)    Prescribing MD told patient he thinks dose (dose pack) was "just too high." 03/09/11   STATES HAS  TAKEN SMALLER DOSE AND STILL HAD RASH AND JITTERINESS  . Ibuprofen Swelling  . Percocet [Oxycodone-Acetaminophen] Nausea And Vomiting  . Iodine Rash    SWELLING -  TOPICAL PAINT  . Latex Dermatitis and Rash    HOME MEDICATIONS: Outpatient Prescriptions Prior to Visit  Medication Sig Dispense Refill  . aspirin-acetaminophen-caffeine (EXCEDRIN EXTRA STRENGTH) 250-250-65 MG per tablet Take 1 tablet by mouth every 6 (six) hours as needed. Pain    . cetirizine (ZYRTEC) 10 MG tablet Take 10 mg by mouth daily.    . cyanocobalamin 100 MCG tablet Take 100 mcg by mouth daily.    Marland Kitchen DILANTIN 100 MG ER capsule Take 1 capsule (100 mg total) by mouth 3 (three) times daily. 90 capsule 6  . HYDROcodone-acetaminophen (NORCO/VICODIN) 5-325 MG per tablet Take 1 tablet by mouth every 6 (six) hours as needed for pain.    Marland Kitchen LAMICTAL 100 MG tablet TAKE 1/2 TABLET BY MOUTH TWICE A DAY WITH 200 MG TABLET *TOATL DOSE 250MG TWICE A DAY * 30 tablet 5  . LAMICTAL 200 MG tablet TAKE 1 TABLET BY MOUTH TWICE A DAY *WITH 1/2 100 MG TABLET *TOTAL DOSE 250 MG TWICE A DAY * 60 tablet 5  . metoprolol succinate (TOPROL XL) 100 MG 24 hr tablet Take 1.5 tablets (150 mg total) by mouth daily. Take with or immediately following meal. NEED APPOINTMENT FOR FUTURE REFILLS. 45 tablet 0  . vitamin C (ASCORBIC ACID) 500 MG tablet Take 500 mg by mouth daily.  No facility-administered medications prior to visit.    PAST MEDICAL HISTORY: Past Medical History  Diagnosis Date  . Spinal fusion failure     3  . Mitral valve prolapse 06/01/10    echo- EF>55% mild tricupsid regurgitation. There is mild Pulmonary Hypertension.. Right  ventricular systolic pressure iselevated at 30-40 mmHg. LV systolic function is normal.  . Arthritis   . Breast cancer 02/13/2009  . Dysrhythmia     palpitations  . H/O hiatal hernia   . Epilepsy     last seizure 1990's  . PONV (postoperative nausea and vomiting)   . Osteopenia   . Depression   .  Cancer of right breast 04/16/2012  . Depression 2014  . SOB (shortness of breath) 10/18/11    met-test  . Memory loss   . Anxiety and depression   . Localization-related (focal) (partial) epilepsy and epileptic syndromes with complex partial seizures, without mention of intractable epilepsy 02/07/2013  . Memory deficit 02/07/2013    PAST SURGICAL HISTORY: Past Surgical History  Procedure Laterality Date  . Tonsillectomy    . Spine surgery  1999, 2003, 2005  . Hand tendon surgery  2012    tendons released in right hand   . Mastectomy, radical  02/13/2009  . Back surgery      screws- lumbar  . Breast surgery  2010    mastectomy - RIGHT  . Colonoscopy with propofol  03/13/2012    Procedure: COLONOSCOPY WITH PROPOFOL;  Surgeon: Garlan Fair, MD;  Location: [facility];  Service: Endoscopy;  Laterality: N/A;  . Esophagogastroduodenoscopy  03/13/2012    Procedure: ESOPHAGOGASTRODUODENOSCOPY (EGD);  Surgeon: Garlan Fair, MD;  Location: [facility];  Service: Endoscopy;  Laterality: N/A;  Reflux, Hiatal Hernia    FAMILY HISTORY: Family History  Problem Relation Age of Onset  . Arthritis Mother     rhuematoid  . Heart disease Mother     Atrial fib  . Heart disease Father   . Heart disease Sister     Atrial fib    SOCIAL HISTORY: History   Social History  . Marital Status: Married    Spouse Name: N/A    Number of Children: 1  . Years of Education: college 2   Occupational History  . unemployed     2003   Social History Main Topics  . Smoking status: Current Every Day Smoker -- 0.50 packs/day    Types: Cigarettes  . Smokeless tobacco: Never Used     Comment: 1/2 pk per day  . Alcohol Use: No  . Drug Use: No  . Sexual Activity: Not on file   Other Topics Concern  . Not on file   Social History Narrative   Patient is married with one child.   Patient is right handed.   Patient has 2 yrs of education.   Patient does not drink caffeine.      PHYSICAL EXAM  Filed Vitals:   03/25/14 1458  BP: 139/85  Pulse: 69  Height: 5' 2"  (1.575 m)  Weight: 148 lb (67.132 kg)   Body mass index is 27.06 kg/(m^2). General: The patient is alert and cooperative at the time of the examination. Skin: No significant peripheral edema is noted.   Neurologic Exam Mental status: The patient is oriented x 3.MMSE 29/30. AFT 15. Cranial nerves: Facial symmetry is present. Speech is normal, no aphasia or dysarthria is noted. Extraocular movements are full. Visual fields are full. Motor: The patient has good strength in  all 4 extremities. No focal weakness Sensory examination: Soft touch sensation is symmetric on the face, arms, and legs. Coordination: The patient has good finger-nose-finger and heel-to-shin bilaterally. Gait and station: The patient has a normal gait. Tandem gait is steady . Romberg is negative. No drift is seen. Reflexes: Deep tendon reflexes are symmetric.   DIAGNOSTIC DATA (LABS, IMAGING, TESTING) - I reviewed patient records, labs, notes, testing and imaging myself where available.  Lab Results  Component Value Date   WBC 8.0 03/18/2014   HGB 16.5* 03/18/2014   HCT 46.8* 03/18/2014   MCV 88.0 03/18/2014   PLT 184 03/18/2014      Component Value Date/Time   NA 141 03/18/2014 1410   NA 145* 02/07/2013 1004   K 4.2 03/18/2014 1410   CL 102 03/18/2014 1410   CO2 26 03/18/2014 1410   GLUCOSE 86 03/18/2014 1410   GLUCOSE 70 02/07/2013 1004   BUN 9 03/18/2014 1410   BUN 10 02/07/2013 1004   CREATININE 0.77 03/18/2014 1410   CALCIUM 9.1 03/18/2014 1410   PROT 7.1 03/18/2014 1410   PROT 6.5 02/07/2013 1004   ALBUMIN 4.0 03/18/2014 1410   AST 15 03/18/2014 1410   ALT 11 03/18/2014 1410   ALKPHOS 133* 03/18/2014 1410   BILITOT 0.3 03/18/2014 1410   GFRNONAA >90 03/18/2014 1410   GFRAA >90 03/18/2014 1410    Lab Results  Component Value Date   TSH 0.676 08/26/2011      ASSESSMENT AND PLAN  53 y.o.  year old female  has a past medical history of seizure disorder and mild memory loss which has improved. She has not had further amnesia events  Will check level of the Lamictal today this will be on brand drug and when she goes to generic Lamictal next month will repeat level again and compare the two. Dilantin today  Continue Lamictal and Dilantin at current doses does not need refills Follow-up in 6 months Call for any seizure activity Dennie Bible, [facility], [facility], [facility] [address] [place], [place] [address] [phone]

## 2014-03-25 NOTE — Progress Notes (Signed)
I have read the note, and I agree with the clinical assessment and plan.  Inocencio Roy KEITH   

## 2014-03-28 ENCOUNTER — Telehealth: Payer: Self-pay

## 2014-03-28 MED ORDER — LAMOTRIGINE 100 MG PO TABS: ORAL_TABLET | ORAL | Status: DC

## 2014-03-28 MED ORDER — PHENYTOIN SODIUM EXTENDED 100 MG PO CAPS: 100.0000 mg | ORAL_CAPSULE | Freq: Three times a day (TID) | ORAL | Status: DC

## 2014-03-28 MED ORDER — LAMOTRIGINE 200 MG PO TABS: ORAL_TABLET | ORAL | Status: DC

## 2014-03-28 NOTE — Telephone Encounter (Signed)
OK with me. WE discussed in the visit. Please let her know labs are still not back.

## 2014-03-28 NOTE — Telephone Encounter (Signed)
I called back.  Patient is aware labs are not back yet.  She asked that we only send 2 week supply of generic meds at this time.  Rx's have been sent, generic okay, 2 week supply only per patient request.  She will call us if there are any issues on generic meds.

## 2014-03-28 NOTE — Telephone Encounter (Signed)
Sorry, just to clarify, she would like the Lamictal and Dilantin changed to generic at this time rather than waiting.  Still has 5 days worth of brand name meds remaining.

## 2014-03-28 NOTE — Telephone Encounter (Signed)
I spoke with the patient.  Says she would like a message sent to [facility] only, asking if she can try generic Lamictal and Dilantin.  Says her ins does pay for the medication, but co-pay keeps increasing.  I have provided her with info for PAP.  She is not able to get assistance with Lamictal, but has not spoken with the Dilantin Program yet [phone]).  She prefers to try generic meds first and see how she does.  Patient still has 5 days of Brand Name medication remaining.  Please advise.  Thank you .

## 2014-03-31 ENCOUNTER — Encounter: Payer: Self-pay | Admitting: Cardiovascular Disease

## 2014-03-31 ENCOUNTER — Telehealth: Payer: Self-pay

## 2014-03-31 ENCOUNTER — Ambulatory Visit (INDEPENDENT_AMBULATORY_CARE_PROVIDER_SITE_OTHER): Payer: Medicare Other | Admitting: Cardiovascular Disease

## 2014-03-31 VITALS — BP 124/88 | HR 75 | Ht 62.0 in | Wt 147.9 lb

## 2014-03-31 DIAGNOSIS — I10 Essential (primary) hypertension: Secondary | ICD-10-CM

## 2014-03-31 DIAGNOSIS — Z720 Tobacco use: Secondary | ICD-10-CM | POA: Diagnosis not present

## 2014-03-31 DIAGNOSIS — M519 Unspecified thoracic, thoracolumbar and lumbosacral intervertebral disc disorder: Secondary | ICD-10-CM | POA: Diagnosis not present

## 2014-03-31 DIAGNOSIS — R0789 Other chest pain: Secondary | ICD-10-CM | POA: Diagnosis not present

## 2014-03-31 DIAGNOSIS — C50911 Malignant neoplasm of unspecified site of right female breast: Secondary | ICD-10-CM

## 2014-03-31 DIAGNOSIS — R9431 Abnormal electrocardiogram [ECG] [EKG]: Secondary | ICD-10-CM

## 2014-03-31 DIAGNOSIS — Z79899 Other long term (current) drug therapy: Secondary | ICD-10-CM | POA: Diagnosis not present

## 2014-03-31 LAB — LAMOTRIGINE LEVEL: Lamotrigine Lvl: 5.8 ug/mL (ref 2.0–20.0)

## 2014-03-31 LAB — PHENYTOIN LEVEL, TOTAL: Phenytoin Lvl: 7.1 ug/mL — ABNORMAL LOW (ref 10.0–20.0)

## 2014-03-31 NOTE — Telephone Encounter (Signed)
I called back.  Got no answer.  Left message relaying Carolyn's note

## 2014-03-31 NOTE — Telephone Encounter (Signed)
I spoke with the patient.  Said she has been taking the generic medication since Saturday.  She did not notice any symptoms on Saturday.  Sunday, she noticed slight tingling in hands and a headache.  Today she noticed increased tingling in hands and headache, as well as dizziness.  She denied experiencing itching, hives or SOB.  Patient would like to know if she should continue taking generics, or go back to brand name.  Please advise.  Thank you.

## 2014-03-31 NOTE — Telephone Encounter (Signed)
She should take the medication for a few weeks to see how she does.

## 2014-03-31 NOTE — Telephone Encounter (Signed)
I called back and spoke with the patient.

## 2014-03-31 NOTE — Progress Notes (Signed)
Patient ID: Wendy Lopez, female   DOB: 09/28/1960, 53 y.o.   MRN: [id number]     HPI: Wendy Lopez is a 53 y.o. female who presents for a 48 month cardiology follow-up evaluation.  Wendy Lopez has a long-standing history of tobacco abuse and started smoking at age 24.  She is currently smoking 1/2-1 pack per day.  In 2009 cardiac catheterization revealed normal coronary arteries. In February 2012 a nuclear perfusion study was done after she experienced episodes of chest pain and shortness of breath and this revealed normal perfusion. At that time, an echo Doppler study reveal mild pulmonary hypertension with estimated RV systolic pressure of 36 mm. Because of issues of recurrent shortness of breath she underwent a cardiopulmonary met test and was found to have a blunted chronotropic response to exercise which is making it difficult for her to improve her endurance and exercise capacity. She does have a history of tachypalpitations. She has a history of invasive ductal carcinoma and is status post right mastectomy with sentinel node biopsy and is status post chemotherapy. She has a history of vitamin D insufficiency mild blood pressure elevation. There is also a history of depression.  On 10/16/2012 , she underwent a 2-D echo Doppler study which revealed an ejection fraction in the range of 60-65%. She did not have wall motion abnormalities and had normal diastolic function. There was evidence for right ventricular hypertrophy with normal RV function. She again had mild pulmonary hypertension with an estimated pressure 39 mm. There is moderate tricuspid regurgitation. Wendy Lopez continues to smoke cigarettes. She does note some indigestion. He also has noticed some left leg weakness and has issues with L4-L5 disc disease. She previously was on a higher dose of present Brintellex but due to tremors she has reduced his dose to 5 mg daily.  A nuclear perfusion study was done on 11/20/2012 after her  ECG revealed slight additional T-wave abnormalities and was essentially normal without wall motion abnormality, scar or ischemia.  She recently did experience an episode of left neck discomfort with left arm radiation which was nonexertional.  Recently, she has noticed some episodes of chest discomfort as well as some back discomfort.  At times it is hard for her to differentiate.  She states she has a bulging disc in her back.  She is unaware of palpitations.  She has been avoiding caffeine and admits that if she does have some caffeine.  She is much more sensitive to its effects.  She admits to occasional indigestion.  She is not been very active.  She does not routinely exercise.  She presents for evaluation.  Past Medical History  Diagnosis Date  . Spinal fusion failure     3  . Mitral valve prolapse 06/01/10    echo- EF>55% mild tricupsid regurgitation. There is mild Pulmonary Hypertension.. Right  ventricular systolic pressure iselevated at 30-40 mmHg. LV systolic function is normal.  . Arthritis   . Breast cancer 02/13/2009  . Dysrhythmia     palpitations  . H/O hiatal hernia   . Epilepsy     last seizure 1990's  . PONV (postoperative nausea and vomiting)   . Osteopenia   . Depression   . Cancer of right breast 04/16/2012  . Depression 2014  . SOB (shortness of breath) 10/18/11    met-test  . Memory loss   . Anxiety and depression   . Localization-related (focal) (partial) epilepsy and epileptic syndromes with complex partial seizures, without mention of  intractable epilepsy 02/07/2013  . Memory deficit 02/07/2013    Past Surgical History  Procedure Laterality Date  . Tonsillectomy    . Spine surgery  1999, 2003, 2005  . Hand tendon surgery  2012    tendons released in right hand   . Mastectomy, radical  02/13/2009  . Back surgery      screws- lumbar  . Breast surgery  2010    mastectomy - RIGHT  . Colonoscopy with propofol  03/13/2012    Procedure: COLONOSCOPY WITH  PROPOFOL;  Surgeon: Garlan Fair, MD;  Location: [facility];  Service: Endoscopy;  Laterality: N/A;  . Esophagogastroduodenoscopy  03/13/2012    Procedure: ESOPHAGOGASTRODUODENOSCOPY (EGD);  Surgeon: Garlan Fair, MD;  Location: [facility];  Service: Endoscopy;  Laterality: N/A;  Reflux, Hiatal Hernia    Allergies  Allergen Reactions  . Carbamazepine Anaphylaxis  . Other     STATES HAS SHORTNESS OF BREATH WITH GENERIC DRUGS     ALSO RASH AND ITCHING  . Pseudoephedrine Hcl Er Other (See Comments)    SEIZURES.  . Valium Other (See Comments)    Hallucinations, spasticity, hyper-relfexive   . Oxycodone Nausea And Vomiting and Rash  . Prednisone Rash and Other (See Comments)    Prescribing MD told patient he thinks dose (dose pack) was "just too high." 03/09/11   STATES HAS TAKEN SMALLER DOSE AND STILL HAD RASH AND JITTERINESS  . Ibuprofen Swelling  . Percocet [Oxycodone-Acetaminophen] Nausea And Vomiting  . Iodine Rash    SWELLING -  TOPICAL PAINT  . Latex Dermatitis and Rash    Current Outpatient Prescriptions  Medication Sig Dispense Refill  . aspirin-acetaminophen-caffeine (EXCEDRIN EXTRA STRENGTH) 250-250-65 MG per tablet Take 1 tablet by mouth every 6 (six) hours as needed. Pain    . cetirizine (ZYRTEC) 10 MG tablet Take 10 mg by mouth daily.    . cyanocobalamin 100 MCG tablet Take 100 mcg by mouth daily.    Marland Kitchen HYDROcodone-acetaminophen (NORCO/VICODIN) 5-325 MG per tablet Take 1 tablet by mouth every 6 (six) hours as needed for pain.    Marland Kitchen lamoTRIgine (LAMICTAL) 100 MG tablet TAKE 1/2 TABLET BY MOUTH TWICE A DAY WITH 200 MG TABLET *TOTAL DOSE 250MG TWICE A DAY * 15 tablet 0  . lamoTRIgine (LAMICTAL) 200 MG tablet TAKE 1 TABLET BY MOUTH TWICE A DAY *WITH 1/2 100 MG TABLET *TOTAL DOSE 250 MG TWICE A DAY * 30 tablet 0  . metoprolol succinate (TOPROL XL) 100 MG 24 hr tablet Take 1.5 tablets (150 mg total) by mouth daily. Take with or immediately following meal. NEED  APPOINTMENT FOR FUTURE REFILLS. 45 tablet 0  . phenytoin (DILANTIN) 100 MG ER capsule Take 1 capsule (100 mg total) by mouth 3 (three) times daily. 45 capsule 0   No current facility-administered medications for this visit.    History   Social History  . Marital Status: Married    Spouse Name: N/A    Number of Children: 1  . Years of Education: college 2   Occupational History  . unemployed     2003   Social History Main Topics  . Smoking status: Current Every Day Smoker -- 0.50 packs/day    Types: Cigarettes  . Smokeless tobacco: Never Used     Comment: 1/2 pk per day  . Alcohol Use: No  . Drug Use: No  . Sexual Activity: Not on file   Other Topics Concern  . Not on file   Social History Narrative  Patient is married with one child.   Patient is right handed.   Patient has 2 yrs of education.   Patient does not drink caffeine.    Social she is married and has one child. She's been smoking since age 60.  ROS General: Negative; No fevers, chills, or night sweats;  HEENT: Negative; No changes in vision or hearing, sinus congestion, difficulty swallowing Pulmonary: Negative; No cough, wheezing, shortness of breath, hemoptysis Cardiovascular: Negative; No chest pain, presyncope, syncope, palpitations GI: Negative; No nausea, vomiting, diarrhea, or abdominal pain GU: Negative; No dysuria, hematuria, or difficulty voiding Musculoskeletal: Negative; no myalgias, joint pain, or weakness Hematologic/Oncology: Remote history of stage I breast CA of the right breast with recent evaluation felt to be stable. Endocrine: Negative; no heat/cold intolerance; no diabetes Neuro: Remote history of seizure disorder for which she has been on chronic Lamictal and Dilantin and is followed now by Dr. Jannifer Franklin.  When previously she had been followed by Dr. Erling Cruz.  She states that she cannot tolerate the generic version of Lamictal as well. Skin: Negative; No rashes or skin  lesions Psychiatric: Negative; No behavioral problems, depression Sleep: Negative; No snoring, daytime sleepiness, hypersomnolence, bruxism, restless legs, hypnogognic hallucinations, no cataplexy Other comprehensive 14 point system review is negative.   PE BP 124/88 mmHg  Pulse 75  Ht 5' 2"  (1.575 m)  Wt 147 lb 14.4 oz (67.087 kg)  BMI 27.04 kg/m2  LMP 11/03/1993  General: Alert, oriented, no distress.  Skin: normal turgor, no rashes HEENT: Normocephalic, atraumatic. Pupils round and reactive; sclera anicteric;no lid lag.  Nose without nasal septal hypertrophy Mouth/Parynx benign; Mallinpatti scale 2 Neck: No JVD, no carotid bruits with normal carotid upstroke Chest, status post right mastectomy Lungs: decreased BS; no wheezing or rales Heart: RRR, s1 s2 normal 1/6 sem at apex; no ectopy; no diastolic murmur.  No rubs thrills or heaves Abdomen: soft, nontender; no hepatosplenomehaly, BS+; abdominal aorta nontender and not dilated by palpation. Pulses 2+ Extremities: no clubbing cyanosis or edema, Homan's sign negative  Neurologic: grossly nonfocal Psychological: Normal affect and mood  ECG (independently read by me) : Normal sinus rhythm at 75 bpm.  There is now significantly more pronounced downsloping ST segment depression in leads II, III, and F V4 through V6 and T-wave inversion in V3 compared to her prior ECG of over one year ago.  Prior ECG: Normal sinus rhythm at 68. T-wave abnormalities  inferiorly in V3 through V6  LABS:  BMET    Component Value Date/Time   NA 141 03/18/2014 1410   NA 145* 02/07/2013 1004   K 4.2 03/18/2014 1410   CL 102 03/18/2014 1410   CO2 26 03/18/2014 1410   GLUCOSE 86 03/18/2014 1410   GLUCOSE 70 02/07/2013 1004   BUN 9 03/18/2014 1410   BUN 10 02/07/2013 1004   CREATININE 0.77 03/18/2014 1410   CALCIUM 9.1 03/18/2014 1410   GFRNONAA >90 03/18/2014 1410   GFRAA >90 03/18/2014 1410     Hepatic Function Panel     Component Value  Date/Time   PROT 7.1 03/18/2014 1410   PROT 6.5 02/07/2013 1004   ALBUMIN 4.0 03/18/2014 1410   AST 15 03/18/2014 1410   ALT 11 03/18/2014 1410   ALKPHOS 133* 03/18/2014 1410   BILITOT 0.3 03/18/2014 1410     CBC    Component Value Date/Time   WBC 8.0 03/18/2014 1410   WBC 7.6 02/07/2013 1004   WBC 6.2 12/28/2009 0941   RBC 5.32*  03/18/2014 1410   RBC 5.42* 02/07/2013 1004   RBC 5.07 12/28/2009 0941   HGB 16.5* 03/18/2014 1410   HGB 16.4* 12/28/2009 0941   HCT 46.8* 03/18/2014 1410   HCT 47.1* 12/28/2009 0941   PLT 184 03/18/2014 1410   PLT 170 12/28/2009 0941   MCV 88.0 03/18/2014 1410   MCV 92.8 12/28/2009 0941   MCH 31.0 03/18/2014 1410   MCH 31.5 02/07/2013 1004   MCH 32.3 12/28/2009 0941   MCHC 35.3 03/18/2014 1410   MCHC 35.1 02/07/2013 1004   MCHC 34.8 12/28/2009 0941   RDW 13.3 03/18/2014 1410   RDW 13.1 02/07/2013 1004   RDW 12.8 12/28/2009 0941   LYMPHSABS 3.4 03/18/2014 1410   LYMPHSABS 2.6 02/07/2013 1004   LYMPHSABS 2.0 12/28/2009 0941   MONOABS 0.7 03/18/2014 1410   MONOABS 0.6 12/28/2009 0941   EOSABS 0.2 03/18/2014 1410   EOSABS 0.2 02/07/2013 1004   BASOSABS 0.1 03/18/2014 1410   BASOSABS 0.1 02/07/2013 1004   BASOSABS 0.1 12/28/2009 0941     BNP No results found for: PROBNP  Lipid Panel  No results found for: CHOL   RADIOLOGY: No results found.    ASSESSMENT AND PLAN: Ms. Dellia Donnelly is a 53 year old female with a long-standing tobacco history of almost 40 years.  In January 2009 prior cardiac catheterization at the [facility] has shown normal coronary arteries. A nuclear perfusion study in 2012 showed normal perfusion. A cardiopulmonary met test in July 2013 showed blunted chronotropic response without ischemic changes but she did have reduced functional status with peak O2 at 69% of predicted. She does have mild pulmonary hypertension.  Her echo continues to show normal function without wall motion abnormalities  despite her ECG changes.  Her last nuclear perfusion study was in August 2014.  Recently, she has noticed more sensation of a chest discomfort sternally and also a sensation of back discomfort.  She is uncertain if this is related to her "bulging "disc.  She also has noticed some increasing indigestion symptoms.  Her ECG today shows more pronounced T-wave abnormalities, both inferiorly as well as in leads V3 to V6 compared to her prior tracing of over a year ago.  With her recent symptoms, ongoing tobacco use, and additional ECG changes.  I am recommending a repeat nuclear perfusion study.  This will be done as a [employer] study.  I'm also recommending complete set of laboratory be undertaken.  I discussed importance of complete smoking cessation.  I will see her back in the office in a approximaltey 3 weeks for further evaluation or sooner if problems arise.  Time spent: 25 minutes  Troy Sine, MD, [facility]  03/31/2014 6:11 PM

## 2014-03-31 NOTE — Patient Instructions (Signed)
Your physician has requested that you have a lexiscan myoview. For further information please visit [email or web address]. Please follow instruction sheet, as given.  Your physician recommends that you return for lab work fasting.  Your physician recommends that you schedule a follow-up appointment in: 3 weeks with Dr. Claiborne Billings.

## 2014-03-31 NOTE — Telephone Encounter (Signed)
Patient is calling because she is now taking generic Lamictal. Since taking generic Lamictal she is having pain in the top of her head, dizzy, nauseated and patient's hands are tingling. Please call patient and advise. Thank you.

## 2014-04-01 DIAGNOSIS — Z124 Encounter for screening for malignant neoplasm of cervix: Secondary | ICD-10-CM | POA: Diagnosis not present

## 2014-04-01 DIAGNOSIS — Z01419 Encounter for gynecological examination (general) (routine) without abnormal findings: Secondary | ICD-10-CM | POA: Diagnosis not present

## 2014-04-01 DIAGNOSIS — Z1151 Encounter for screening for human papillomavirus (HPV): Secondary | ICD-10-CM | POA: Diagnosis not present

## 2014-04-03 ENCOUNTER — Other Ambulatory Visit: Payer: Self-pay | Admitting: Cardiovascular Disease

## 2014-04-03 ENCOUNTER — Telehealth (HOSPITAL_COMMUNITY): Payer: Self-pay

## 2014-04-03 NOTE — Telephone Encounter (Signed)
Rx was sent to pharmacy electronically. 

## 2014-04-03 NOTE — Telephone Encounter (Signed)
Encounter complete. 

## 2014-04-08 ENCOUNTER — Encounter (HOSPITAL_COMMUNITY): Payer: Medicare Other

## 2014-04-10 DIAGNOSIS — G40909 Epilepsy, unspecified, not intractable, without status epilepticus: Secondary | ICD-10-CM | POA: Diagnosis not present

## 2014-04-10 DIAGNOSIS — I10 Essential (primary) hypertension: Secondary | ICD-10-CM | POA: Diagnosis not present

## 2014-04-10 DIAGNOSIS — C50911 Malignant neoplasm of unspecified site of right female breast: Secondary | ICD-10-CM | POA: Diagnosis not present

## 2014-04-10 DIAGNOSIS — Z0000 Encounter for general adult medical examination without abnormal findings: Secondary | ICD-10-CM | POA: Diagnosis not present

## 2014-04-10 DIAGNOSIS — Z136 Encounter for screening for cardiovascular disorders: Secondary | ICD-10-CM | POA: Diagnosis not present

## 2014-04-10 DIAGNOSIS — F418 Other specified anxiety disorders: Secondary | ICD-10-CM | POA: Diagnosis not present

## 2014-04-10 DIAGNOSIS — F17210 Nicotine dependence, cigarettes, uncomplicated: Secondary | ICD-10-CM | POA: Diagnosis not present

## 2014-04-10 DIAGNOSIS — Z1389 Encounter for screening for other disorder: Secondary | ICD-10-CM | POA: Diagnosis not present

## 2014-04-15 ENCOUNTER — Telehealth (HOSPITAL_COMMUNITY): Payer: Self-pay

## 2014-04-15 NOTE — Telephone Encounter (Signed)
Pt did RBV same. 

## 2014-04-17 ENCOUNTER — Ambulatory Visit (HOSPITAL_COMMUNITY)
Admission: RE | Admit: 2014-04-17 | Discharge: 2014-04-17 | Disposition: A | Discharge status: Home / Self Care | Payer: Medicare Other | Source: Ambulatory Visit | Attending: Cardiovascular Disease | Admitting: Cardiovascular Disease

## 2014-04-17 DIAGNOSIS — R9431 Abnormal electrocardiogram [ECG] [EKG]: Secondary | ICD-10-CM | POA: Diagnosis not present

## 2014-04-17 DIAGNOSIS — Z8249 Family history of ischemic heart disease and other diseases of the circulatory system: Secondary | ICD-10-CM | POA: Insufficient documentation

## 2014-04-17 DIAGNOSIS — I10 Essential (primary) hypertension: Secondary | ICD-10-CM

## 2014-04-17 DIAGNOSIS — R0789 Other chest pain: Secondary | ICD-10-CM

## 2014-04-17 DIAGNOSIS — F17200 Nicotine dependence, unspecified, uncomplicated: Secondary | ICD-10-CM | POA: Diagnosis not present

## 2014-04-17 DIAGNOSIS — R079 Chest pain, unspecified: Secondary | ICD-10-CM | POA: Insufficient documentation

## 2014-04-17 DIAGNOSIS — R0600 Dyspnea, unspecified: Secondary | ICD-10-CM | POA: Diagnosis not present

## 2014-04-17 MED ORDER — AMINOPHYLLINE 25 MG/ML IV SOLN
75.0000 mg | Freq: Once | INTRAVENOUS | Status: AC
  Administered 2014-04-17: 75 mg via INTRAVENOUS

## 2014-04-17 MED ORDER — TECHNETIUM TC 99M SESTAMIBI GENERIC - CARDIOLITE
31.6000 | Freq: Once | INTRAVENOUS | Status: AC | PRN
  Administered 2014-04-17: 32 via INTRAVENOUS

## 2014-04-17 MED ORDER — TECHNETIUM TC 99M SESTAMIBI GENERIC - CARDIOLITE
10.8000 | Freq: Once | INTRAVENOUS | Status: AC | PRN
  Administered 2014-04-17: 11 via INTRAVENOUS

## 2014-04-17 MED ORDER — REGADENOSON 0.4 MG/5ML IV SOLN
0.4000 mg | Freq: Once | INTRAVENOUS | Status: AC
  Administered 2014-04-17: 0.4 mg via INTRAVENOUS

## 2014-04-17 NOTE — Procedures (Addendum)
[facility] [address] [place] [address] [phone]  Cardiology Nuclear Med Study  Wendy Lopez is a 53 y.o. female     MRN : [id number]     DOB: Mar 12, 1961  Procedure Date: 04/17/2014  Nuclear Med Background Indication for Stress Test:  Evaluation for Ischemia History:  MVP and Hx of seizures;Last NUC MPI on 11/20/2012-normal;EF=85%;Cath in 2009-normal coronaries Cardiac Risk Factors: Family History - CAD and Smoker  Symptoms:  Chest Pain, DOE, Palpitations and SOB   Nuclear Pre-Procedure Caffeine/Decaff Intake:  7:00pm NPO After: 5:00am   IV Site: L Forearm  IV 0.9% NS with Angio Cath:  22g  Chest Size (in):  n/a IV Started by: Rolene Course, RN  Height: 5\' 2"  (1.575 m)  Cup Size: B;Pt is s/p Right mastectomy with lymphectomy.  BMI:  Body mass index is 26.88 kg/(m^2). Weight:  147 lb (66.679 kg)   Tech Comments:  No BP or IV in Right arm.    Nuclear Med Study 1 or 2 day study: 1 day  Stress Test Type:  [place]  Order Authorizing Provider:  Shelva Majestic, MD   Resting Radionuclide: Technetium 10m Sestamibi  Resting Radionuclide Dose: 10.8 mCi   Stress Radionuclide:  Technetium 50m Sestamibi  Stress Radionuclide Dose: 31.6 mCi           Stress Protocol Rest HR: 59 Stress HR: 80  Rest BP: 132/88 Stress BP: 143/73  Exercise Time (min): n/a METS: n/a   Predicted Max HR: 166 bpm % Max HR: 53.01 bpm Rate Pressure Product: 12936  Dose of Adenosine (mg):  n/a Dose of Lexiscan: 0.4 mg  Dose of Atropine (mg): n/a Dose of Dobutamine: n/a mcg/kg/min (at max HR)  Stress Test Technologist: Leane Para, CCT Nuclear Technologist: Imagene Riches, CNMT   Rest Procedure:  Myocardial perfusion imaging was performed at rest 45 minutes following the intravenous administration of Technetium 65m Sestamibi. Stress Procedure:  The patient received IV Lexiscan 0.4 mg over 15-seconds.  Technetium 34m Sestamibi injected IV  at 30-seconds. Patient experienced SOB and 75 mg Aminophylline was administered. There were no significant changes with Lexiscan.  Quantitative spect images were obtained after a 45 minute delay.  Transient Ischemic Dilatation (Normal <1.22):  1.33  QGS EDV:  65 ml QGS ESV:  23 ml LV Ejection Fraction: 66%     Rest ECG: NSR with T wave changes 2,3,aVF, V3-V6  Stress ECG: No significant change from baseline ECG  QPS Raw Data Images:  Normal; no motion artifact; normal heart/lung ratio. Stress Images:  Normal homogeneous uptake in all areas of the myocardium. Rest Images:  Normal homogeneous uptake in all areas of the myocardium. Subtraction (SDS):  Normal  Impression Exercise Capacity:  Lexiscan with no exercise. BP Response:  Normal blood pressure response. Clinical Symptoms:  Mild shortness of breath ECG Impression:  Baseline inferolateral ST changes and T wave inversion V3-4 without significant change with stress Comparison with Prior Nuclear Study: No significant change from previous study  Overall Impression:  Normal stress nuclear study.  LV Wall Motion:  NL LV Function, EF 66%; NL Wall Motion.   Troy Sine, MD  04/17/2014 5:18 PM

## 2014-04-21 ENCOUNTER — Encounter: Payer: Self-pay | Admitting: Cardiovascular Disease

## 2014-04-21 ENCOUNTER — Ambulatory Visit (INDEPENDENT_AMBULATORY_CARE_PROVIDER_SITE_OTHER): Payer: Medicare Other | Admitting: Cardiovascular Disease

## 2014-04-21 VITALS — BP 122/78 | HR 72 | Ht 62.0 in | Wt 149.4 lb

## 2014-04-21 DIAGNOSIS — Z720 Tobacco use: Secondary | ICD-10-CM | POA: Diagnosis not present

## 2014-04-21 DIAGNOSIS — R9431 Abnormal electrocardiogram [ECG] [EKG]: Secondary | ICD-10-CM | POA: Diagnosis not present

## 2014-04-21 DIAGNOSIS — I10 Essential (primary) hypertension: Secondary | ICD-10-CM | POA: Diagnosis not present

## 2014-04-21 DIAGNOSIS — M519 Unspecified thoracic, thoracolumbar and lumbosacral intervertebral disc disorder: Secondary | ICD-10-CM

## 2014-04-21 DIAGNOSIS — R079 Chest pain, unspecified: Secondary | ICD-10-CM | POA: Insufficient documentation

## 2014-04-21 MED ORDER — AMLODIPINE BESYLATE 2.5 MG PO TABS: 2.5000 mg | ORAL_TABLET | Freq: Every day | ORAL | Status: DC

## 2014-04-21 NOTE — Progress Notes (Signed)
Patient ID: Wendy Lopez, female   DOB: 11/10/1960, 54 y.o.   MRN: [id number]     HPI: Wendy Lopez is a 54 y.o. female who presents for a cardiology follow-up evaluation.   Wendy Lopez has a long-standing history of tobacco abuse and started smoking at age 76.  In 2009 cardiac catheterization revealed normal coronary arteries. In February 2012 a nuclear perfusion study was done after she experienced episodes of chest pain and shortness of breath and this revealed normal perfusion. At that time, an echo Doppler study reveal mild pulmonary hypertension with estimated RV systolic pressure of 36 mm. Because of issues of recurrent shortness of breath she underwent a cardiopulmonary met test and was found to have a blunted chronotropic response to exercise which is making it difficult for her to improve her endurance and exercise capacity. She does have a history of tachypalpitations. She has a history of invasive ductal carcinoma and is status post right mastectomy with sentinel node biopsy and is status post chemotherapy. She has a history of vitamin D insufficiency mild blood pressure elevation. There is also a history of depression.  On 10/16/2012 , a 2-D echo Doppler study  revealed an ejection fraction in the range of 60-65%. She did not have wall motion abnormalities and had normal diastolic function. There was evidence for right ventricular hypertrophy with normal RV function. She again had mild pulmonary hypertension with an estimated pressure 39 mm. There is moderate tricuspid regurgitation. Wendy Lopez continues to smoke cigarettes. She does note some indigestion. He also has noticed some left leg weakness and has issues with L4-L5 disc disease. She previously was on a higher dose of present Brintellex but due to tremors she has reduced his dose to 5 mg daily.  A nuclear perfusion study was done on 11/20/2012 after her ECG revealed slight additional T-wave abnormalities and was essentially  normal without wall motion abnormality, scar or ischemia.  She recently did experience an episode of left neck discomfort with left arm radiation which was nonexertional.  I had not seen her in 15 months, but recently she had noticed some episodes of chest discomfort as well as some back discomfort which led to her reevaluation last month. She states she has a bulging disc in her back.  She is unaware of palpitations.  She has been avoiding caffeine and admits that if she does have some caffeine.  She is much more sensitive to its effects.  She admits to occasional indigestion.  She is not been very active.  She does not routinely exercise.   Since I last saw her, laboratory was performed which revealed hemoglobin 16.2 and hematocrit 48.6.  She had normal chemistry profile.  Lipid studies revealed a total cholesterol 179, HDL 72, triglycerides 68, LDL 93.  Thyroid function studies were normal.  When I had seen her last month, her EKG had shown slight lean more pronounced downsloping ST segment depression in the inferior to inferolateral leads.  I recommended a subsequent nuclear perfusion study.  This was done on 04/17/2014.  Significant leak change from previously.  She again had basal inferolateral ST-T changes with T-wave inversion in V3 through V4 which did not significant change with stress.  She had normal perfusion with post-rest ejection fraction at 65%.  She has continued to smoke cigarettes currently one half pack per day.  Since I last saw her, she changed her high tarvcigarette brand  to now a light tar brand igarette.  She plans to reduce  to a ultralight cigarettes week.  She still smoking approximately 10 cigarettes per day.  Past Medical History  Diagnosis Date  . Spinal fusion failure     3  . Mitral valve prolapse 06/01/10    echo- EF>55% mild tricupsid regurgitation. There is mild Pulmonary Hypertension.. Right  ventricular systolic pressure iselevated at 30-40 mmHg. LV systolic  function is normal.  . Arthritis   . Breast cancer 02/13/2009  . Dysrhythmia     palpitations  . H/O hiatal hernia   . Epilepsy     last seizure 1990's  . PONV (postoperative nausea and vomiting)   . Osteopenia   . Depression   . Cancer of right breast 04/16/2012  . Depression 2014  . SOB (shortness of breath) 10/18/11    met-test  . Memory loss   . Anxiety and depression   . Localization-related (focal) (partial) epilepsy and epileptic syndromes with complex partial seizures, without mention of intractable epilepsy 02/07/2013  . Memory deficit 02/07/2013    Past Surgical History  Procedure Laterality Date  . Tonsillectomy    . Spine surgery  1999, 2003, 2005  . Hand tendon surgery  2012    tendons released in right hand   . Mastectomy, radical  02/13/2009  . Back surgery      screws- lumbar  . Breast surgery  2010    mastectomy - RIGHT  . Colonoscopy with propofol  03/13/2012    Procedure: COLONOSCOPY WITH PROPOFOL;  Surgeon: Garlan Fair, MD;  Location: [facility];  Service: Endoscopy;  Laterality: N/A;  . Esophagogastroduodenoscopy  03/13/2012    Procedure: ESOPHAGOGASTRODUODENOSCOPY (EGD);  Surgeon: Garlan Fair, MD;  Location: [facility];  Service: Endoscopy;  Laterality: N/A;  Reflux, Hiatal Hernia    Allergies  Allergen Reactions  . Carbamazepine Anaphylaxis  . Other     STATES HAS SHORTNESS OF BREATH WITH GENERIC DRUGS     ALSO RASH AND ITCHING  . Pseudoephedrine Hcl Er Other (See Comments)    SEIZURES.  . Valium Other (See Comments)    Hallucinations, spasticity, hyper-relfexive   . Oxycodone Nausea And Vomiting and Rash  . Prednisone Rash and Other (See Comments)    Prescribing MD told patient he thinks dose (dose pack) was "just too high." 03/09/11   STATES HAS TAKEN SMALLER DOSE AND STILL HAD RASH AND JITTERINESS  . Ibuprofen Swelling  . Percocet [Oxycodone-Acetaminophen] Nausea And Vomiting  . Iodine Rash    SWELLING -  TOPICAL PAINT  .  Latex Dermatitis and Rash    Current Outpatient Prescriptions  Medication Sig Dispense Refill  . aspirin-acetaminophen-caffeine (EXCEDRIN EXTRA STRENGTH) 250-250-65 MG per tablet Take 1 tablet by mouth every 6 (six) hours as needed. Pain    . cetirizine (ZYRTEC) 10 MG tablet Take 10 mg by mouth daily.    . cyanocobalamin 100 MCG tablet Take 100 mcg by mouth daily.    Marland Kitchen HYDROcodone-acetaminophen (NORCO/VICODIN) 5-325 MG per tablet Take 1 tablet by mouth every 6 (six) hours as needed for pain.    Marland Kitchen lamoTRIgine (LAMICTAL) 100 MG tablet TAKE 1/2 TABLET BY MOUTH TWICE A DAY WITH 200 MG TABLET *TOTAL DOSE 250MG TWICE A DAY * 15 tablet 0  . lamoTRIgine (LAMICTAL) 200 MG tablet TAKE 1 TABLET BY MOUTH TWICE A DAY *WITH 1/2 100 MG TABLET *TOTAL DOSE 250 MG TWICE A DAY * 30 tablet 0  . phenytoin (DILANTIN) 100 MG ER capsule Take 1 capsule (100 mg total) by mouth 3 (three)  times daily. 45 capsule 0  . TOPROL XL 100 MG 24 hr tablet TAKE 1&1/2 TABLETS BY MOUTH DAILY WITH OR IMMEDIATELY FOLLOWING A MEAL 45 tablet 6   No current facility-administered medications for this visit.    History   Social History  . Marital Status: Married    Spouse Name: N/A    Number of Children: 1  . Years of Education: college 2   Occupational History  . unemployed     2003   Social History Main Topics  . Smoking status: Current Every Day Smoker -- 0.50 packs/day    Types: Cigarettes  . Smokeless tobacco: Never Used     Comment: 1/2 pk per day  . Alcohol Use: No  . Drug Use: No  . Sexual Activity: Not on file   Other Topics Concern  . Not on file   Social History Narrative   Patient is married with one child.   Patient is right handed.   Patient has 2 yrs of education.   Patient does not drink caffeine.    Social she is married and has one child. She's been smoking since age 79.  ROS General: Negative; No fevers, chills, or night sweats;  HEENT: Negative; No changes in vision or hearing, sinus  congestion, difficulty swallowing Pulmonary: Negative; No cough, wheezing, shortness of breath, hemoptysis Cardiovascular: Negative; No chest pain, presyncope, syncope, palpitations GI: Negative; No nausea, vomiting, diarrhea, or abdominal pain GU: Negative; No dysuria, hematuria, or difficulty voiding Musculoskeletal: Negative; no myalgias, joint pain, or weakness Hematologic/Oncology: Remote history of stage I breast CA of the right breast with recent evaluation felt to be stable. Endocrine: Negative; no heat/cold intolerance; no diabetes Neuro: Remote history of seizure disorder for which she has been on chronic Lamictal and Dilantin and is followed now by Dr. Jannifer Franklin.  When previously she had been followed by Dr. Erling Cruz.  She states that she cannot tolerate the generic version of Lamictal as well. Skin: Negative; No rashes or skin lesions Psychiatric: Negative; No behavioral problems, depression Sleep: Negative; No snoring, daytime sleepiness, hypersomnolence, bruxism, restless legs, hypnogognic hallucinations, no cataplexy Other comprehensive 14 point system review is negative.   PE BP 122/78 mmHg  Pulse 72  Ht _0  (1.575 m)  Wt 149 lb 6.4 oz (67.767 kg)  BMI 27.32 kg/m2  LMP 11/03/1993  Repeat blood pressure by me was 148/84 General: Alert, oriented, no distress.  Skin: normal turgor, no rashes HEENT: Normocephalic, atraumatic. Pupils round and reactive; sclera anicteric;no lid lag.  Nose without nasal septal hypertrophy Mouth/Parynx benign; Mallinpatti scale 2 Neck: No JVD, no carotid bruits with normal carotid upstroke Chest, status post right mastectomy Lungs: decreased BS; no wheezing or rales Heart: RRR, s1 s2 normal 1/6 sem at apex; no ectopy; no diastolic murmur.  No rubs thrills or heaves Abdomen: soft, nontender; no hepatosplenomehaly, BS+; abdominal aorta nontender and not dilated by palpation. Pulses 2+ Extremities: no clubbing cyanosis or edema, Homan's sign  negative  Neurologic: grossly nonfocal Psychological: Normal affect and mood  03/31/2014 ECG (independently read by me) : Normal sinus rhythm at 75 bpm.  There is now significantly more pronounced downsloping ST segment depression in leads II, III, and F V4 through V6 and T-wave inversion in V3 compared to her prior ECG of over one year ago.  Prior ECG: Normal sinus rhythm at 68. T-wave abnormalities  inferiorly in V3 through V6  LABS:  BMET  BMP Latest Ref Rng 03/18/2014 07/23/2013 02/07/2013  Glucose 70 -  99 mg/dL 86 91 70  BUN 6 - 23 mg/dL _0 Creatinine 0.50 - 1.10 mg/dL 0.77 0.83 0.75  BUN/Creat Ratio 9 - 23 - - 13  Sodium 137 - 147 mEq/L 141 143 145(H)  Potassium 3.7 - 5.3 mEq/L 4.2 4.3 4.2  Chloride 96 - 112 mEq/L 102 104 100  CO2 19 - 32 mEq/L _1 Calcium 8.4 - 10.5 mg/dL 9.1 9.2 9.1     Hepatic Function Panel     Component Value Date/Time   PROT 7.1 03/18/2014 1410   PROT 6.5 02/07/2013 1004   ALBUMIN 4.0 03/18/2014 1410   AST 15 03/18/2014 1410   ALT 11 03/18/2014 1410   ALKPHOS 133* 03/18/2014 1410   BILITOT 0.3 03/18/2014 1410     CBC CBC Latest Ref Rng 03/18/2014 07/23/2013 02/07/2013  WBC 4.0 - 10.5 K/uL 8.0 6.4 7.6  Hemoglobin 12.0 - 15.0 g/dL 16.5(H) 15.4(H) 17.1(H)  Hematocrit 36.0 - 46.0 % 46.8(H) 44.7 48.7(H)  Platelets 150 - 400 K/uL 184 170 209      BNP No results found for: PROBNP  Lipid Panel  No results found for: CHOL   RADIOLOGY: No results found.    ASSESSMENT AND PLAN: Wendy Lopez is a 54 year old female with a long-standing 40 year tobacco history.  In January 2009 prior cardiac catheterization at the [facility] revealed normal coronary arteries. A nuclear perfusion study in 2012 showed normal perfusion. A cardiopulmonary met test in July 2013 showed blunted chronotropic response without ischemic changes but she did have reduced functional status with peak O2 at 69% of predicted. She does have mild  pulmonary hypertension.  An echo revealed normal function without wall motion abnormalities despite her ECG changes.  Recently, she had noticed more sensation of a chest discomfort sternally and also a sensation of back discomfort.  She is uncertain if this is related to her "bulging "disc.  When I last saw her ECG revealed more pronounced T-wave abnormalities, both inferiorly as well as in leads V3 to V6 compared to her prior tracing of over a year ago.  On her nuclear perfusion study, she did experience symptoms of increasing shortness of breath, which were more significant than on previous studies.  However, there were no new ECG changes or development of significantly abnormal nuclear perfusion imaging.  It is possible she may have small vessel disease which may be contributing to some of her ECG findings, as well as her symptoms.  For this reason, I'm electing to add low-dose amlodipine at 2.5 mg.  Remotely, she had had a blunted cardiovascular accelerated response to exercise.  She denies recent palpitations.  I again discussed with her the importance of complete smoking cessation.  I recommended she reduce her smoking to just 5 cigarettes per day with ultra light cigarette for the next week and then ultimately discontinue.  We discussed weight loss and the need for improvement in aerobic capacity with increased exercise.  We discussed the importance of exercising for a total of 150 minutes per week of moderate activity.  I will see her in 6 months for reevaluation or sooner if problems arise.  Time spent: 25 minutes  Troy Sine, MD, [facility]  04/21/2014 11:50 AM

## 2014-04-21 NOTE — Patient Instructions (Signed)
Your physician has recommended you make the following change in your medication: start new prescription for amlodipine 2.5 mg. This has already been sent to your pharmacy.  Your physician wants you to follow-up in: 6 months or sooner if needed. You will receive a reminder letter in the mail two months in advance. If you don't receive a letter, please call our office to schedule the follow-up appointment.

## 2014-05-19 ENCOUNTER — Telehealth: Payer: Self-pay | Admitting: Nurse Practitioner

## 2014-05-19 NOTE — Telephone Encounter (Signed)
Patient is calling about Rx Lamictal .  Wants to come off of Rxcompletely and needs instructions as to how.  Please call

## 2014-05-19 NOTE — Telephone Encounter (Signed)
I called the patient back.  Patient says she no longer wishes to take Lamictal.  She is unable to tolerate generic Lamictal, and brand name is too expensive.  The patient assistance for Lamictal has been discontinued.  They still do offer assistance with Lamictal XR, however patient says she does not wish to take XR medication.  Patient would like instructions on tapering her dose, or would like to know if something else is recommended.  Please advise.  Thank you.

## 2014-05-20 ENCOUNTER — Telehealth: Payer: Self-pay

## 2014-05-20 NOTE — Telephone Encounter (Signed)
Wendy Lopez please do PA for Keppra thanks

## 2014-05-20 NOTE — Telephone Encounter (Signed)
I called the patient her co pay for brand Lamictal is $450. She claims she had side effects to the generic lamictal stomach upset and a rash. We can try Keppra 500mg  twice daily. She wants to call her insurance company first. She will have to taper Lamictal by 50mg  weekly until completely tapered. She is agreeable to this. She will call back to let me know.

## 2014-05-20 NOTE — Telephone Encounter (Signed)
I called the patient back to obtain current ins ID#.  She said it is [id number].  Advised we will contact ins for PA.  She verbalized understanding.

## 2014-05-20 NOTE — Telephone Encounter (Signed)
[employer] has approved the request for coverage on Keppra effective until 04/18/2015 Ref # [id number]

## 2014-05-20 NOTE — Telephone Encounter (Signed)
Pt is calling back to state that she spoke with her insurance company and yes she wants to go on Keppra 500mg , but she needs for you to write a letter to [facility] stating she needs this drug.  Please call and advise.

## 2014-05-27 DIAGNOSIS — M4302 Spondylolysis, cervical region: Secondary | ICD-10-CM | POA: Diagnosis not present

## 2014-05-27 DIAGNOSIS — M47816 Spondylosis without myelopathy or radiculopathy, lumbar region: Secondary | ICD-10-CM | POA: Diagnosis not present

## 2014-06-03 ENCOUNTER — Other Ambulatory Visit: Payer: Self-pay

## 2014-06-03 MED ORDER — LEVETIRACETAM 500 MG PO TABS: 500.0000 mg | ORAL_TABLET | Freq: Two times a day (BID) | ORAL | Status: DC

## 2014-06-03 NOTE — Telephone Encounter (Signed)
Rx has been sent.  I called the patient back.  Got no answer.  Left message.

## 2014-06-03 NOTE — Telephone Encounter (Signed)
Patient stated she has not received Rx KEPPRA from pharmacy.  [place] [place] and [employer] has approved.  Please call and advise.

## 2014-06-11 ENCOUNTER — Encounter: Payer: Self-pay | Admitting: Hematology and Oncology

## 2014-06-23 ENCOUNTER — Other Ambulatory Visit: Payer: Self-pay | Admitting: Neurology

## 2014-06-25 NOTE — Telephone Encounter (Signed)
The clinic was supposed to be doing refills, however this one did not get completed by them.

## 2014-08-04 ENCOUNTER — Other Ambulatory Visit: Payer: Self-pay | Admitting: Nurse Practitioner

## 2014-09-09 DIAGNOSIS — M4302 Spondylolysis, cervical region: Secondary | ICD-10-CM | POA: Diagnosis not present

## 2014-09-09 DIAGNOSIS — M47816 Spondylosis without myelopathy or radiculopathy, lumbar region: Secondary | ICD-10-CM | POA: Diagnosis not present

## 2014-09-23 ENCOUNTER — Ambulatory Visit (INDEPENDENT_AMBULATORY_CARE_PROVIDER_SITE_OTHER): Payer: Medicare Other | Admitting: Nurse Practitioner

## 2014-09-23 ENCOUNTER — Encounter: Payer: Self-pay | Admitting: Nurse Practitioner

## 2014-09-23 VITALS — BP 131/93 | HR 86 | Ht 62.0 in | Wt 148.5 lb

## 2014-09-23 DIAGNOSIS — R413 Other amnesia: Secondary | ICD-10-CM

## 2014-09-23 DIAGNOSIS — Z5181 Encounter for therapeutic drug level monitoring: Secondary | ICD-10-CM | POA: Diagnosis not present

## 2014-09-23 DIAGNOSIS — G40209 Localization-related (focal) (partial) symptomatic epilepsy and epileptic syndromes with complex partial seizures, not intractable, without status epilepticus: Secondary | ICD-10-CM | POA: Diagnosis not present

## 2014-09-23 MED ORDER — PHENYTOIN SODIUM EXTENDED 100 MG PO CAPS: 100.0000 mg | ORAL_CAPSULE | Freq: Three times a day (TID) | ORAL | Status: DC

## 2014-09-23 NOTE — Progress Notes (Signed)
[facility]  PATIENT: Wendy Lopez DOB: 04-16-1961   REASON FOR VISIT: Follow-up for seizure disorder  HISTORY FROM: Patient    HISTORY OF PRESENT ILLNESS:Ms. [name] is a 54 year old right-handed white female with a history of seizures. She was last seen in this office 03/25/2014  The patient has been on Lamictal and Dilantin. Her Dilantin level was decreased to 300 mg at bedtime and since that time, she feels that she has done better with the vision. The patient no longer seen a purple haze. She indicates that her cognitive abilities have improved, and her gait stability has improved. The patient has not had any further amnestic events. She is now back to driving a motor vehicle. When last seen she was on  brand Lamictal but unable to afford the cost due to  doughnut hole. She  switch to generic and had side effects, . of dizziness headache and tingling in the hands. She was then switched to [place]. She has done much better in fact says she feels the best she has felt in a long time. She returns for reevaluation labs and refills   REVIEW OF SYSTEMS: Full 14 system review of systems performed and notable only for those listed, all others are neg:  Constitutional: Activity change Cardiovascular: neg Ear/Nose/Throat: neg  Skin: neg Eyes: neg Respiratory: neg Gastroitestinal: neg  Hematology/Lymphatic: neg  Endocrine: neg Musculoskeletal:neg Allergy/Immunology: neg Neurological: neg Psychiatric: neg Sleep : neg   ALLERGIES: Allergies  Allergen Reactions  . Carbamazepine Anaphylaxis  . Lamictal [Lamotrigine] Anaphylaxis    SOB,   . Other     STATES HAS SHORTNESS OF BREATH WITH GENERIC DRUGS     ALSO RASH AND ITCHING  . Pseudoephedrine Hcl Er Other (See Comments)    SEIZURES.  . Valium Other (See Comments)    Hallucinations, spasticity, hyper-relfexive   . Oxycodone Nausea And Vomiting and Rash  . Prednisone Rash and Other (See Comments)   Prescribing MD told patient he thinks dose (dose pack) was "just too high." 03/09/11   STATES HAS TAKEN SMALLER DOSE AND STILL HAD RASH AND JITTERINESS  . Ibuprofen Swelling  . Percocet [Oxycodone-Acetaminophen] Nausea And Vomiting  . Iodine Rash    SWELLING -  TOPICAL PAINT  . Latex Dermatitis and Rash    HOME MEDICATIONS: Outpatient Prescriptions Prior to Visit  Medication Sig Dispense Refill  . amLODipine (NORVASC) 2.5 MG tablet Take 1 tablet (2.5 mg total) by mouth daily. 30 tablet 6  . aspirin-acetaminophen-caffeine (EXCEDRIN EXTRA STRENGTH) 858-850-27 MG per tablet Take 1 tablet by mouth every 6 (six) hours as needed. Pain    . cetirizine (ZYRTEC) 10 MG tablet Take 10 mg by mouth daily.    . cyanocobalamin 100 MCG tablet Take 100 mcg by mouth daily.    Marland Kitchen HYDROcodone-acetaminophen (NORCO/VICODIN) 5-325 MG per tablet Take 1 tablet by mouth every 6 (six) hours as needed for pain.    Marland Kitchen KEPPRA 500 MG tablet TAKE 1 TABLET BY MOUTH TWICE A DAY 60 tablet 1  . lamoTRIgine (LAMICTAL) 100 MG tablet TAKE 1/2 TABLET BY MOUTH TWICE A DAY WITH 200 MG TABLET *TOTAL DOSE 250MG TWICE A DAY * 15 tablet 0  . lamoTRIgine (LAMICTAL) 200 MG tablet TAKE 1 TABLET BY MOUTH TWICE A DAY *WITH 1/2 100 MG TABLET *TOTAL DOSE 250 MG TWICE A DAY * 30 tablet 0  . phenytoin (DILANTIN) 100 MG ER capsule Take 1 capsule (100 mg total) by mouth 3 (three) times daily. [place]  capsule 0  . phenytoin (DILANTIN) 100 MG ER capsule Take 1 capsule (100 mg total) by mouth 3 (three) times daily. 90 capsule 3  . TOPROL XL 100 MG 24 hr tablet TAKE 1&1/2 TABLETS BY MOUTH DAILY WITH OR IMMEDIATELY FOLLOWING A MEAL 45 tablet 6   No facility-administered medications prior to visit.    PAST MEDICAL HISTORY: Past Medical History  Diagnosis Date  . Spinal fusion failure     3  . Mitral valve prolapse 06/01/10    echo- EF>55% mild tricupsid regurgitation. There is mild Pulmonary Hypertension.. Right  ventricular systolic pressure  iselevated at 30-40 mmHg. LV systolic function is normal.  . Arthritis   . Breast cancer 02/13/2009  . Dysrhythmia     palpitations  . H/O hiatal hernia   . Epilepsy     last seizure 1990's  . PONV (postoperative nausea and vomiting)   . Osteopenia   . Depression   . Cancer of right breast 04/16/2012  . Depression 2014  . SOB (shortness of breath) 10/18/11    met-test  . Memory loss   . Anxiety and depression   . Localization-related (focal) (partial) epilepsy and epileptic syndromes with complex partial seizures, without mention of intractable epilepsy 02/07/2013  . Memory deficit 02/07/2013    PAST SURGICAL HISTORY: Past Surgical History  Procedure Laterality Date  . Tonsillectomy    . Spine surgery  1999, 2003, 2005  . Hand tendon surgery  2012    tendons released in right hand   . Mastectomy, radical  02/13/2009  . Back surgery      screws- lumbar  . Breast surgery  2010    mastectomy - RIGHT  . Colonoscopy with propofol  03/13/2012    Procedure: COLONOSCOPY WITH PROPOFOL;  Surgeon: Garlan Fair, MD;  Location: [facility];  Service: Endoscopy;  Laterality: N/A;  . Esophagogastroduodenoscopy  03/13/2012    Procedure: ESOPHAGOGASTRODUODENOSCOPY (EGD);  Surgeon: Garlan Fair, MD;  Location: [facility];  Service: Endoscopy;  Laterality: N/A;  Reflux, Hiatal Hernia    FAMILY HISTORY: Family History  Problem Relation Age of Onset  . Arthritis Mother     rhuematoid  . Heart disease Mother     Atrial fib  . Heart disease Father   . Heart disease Sister     Atrial fib    SOCIAL HISTORY: History   Social History  . Marital Status: Married    Spouse Name: N/A  . Number of Children: 1  . Years of Education: college 2   Occupational History  . unemployed     2003   Social History Main Topics  . Smoking status: Current Every Day Smoker -- 0.50 packs/day    Types: Cigarettes  . Smokeless tobacco: Never Used     Comment: 1/2 pk per day  . Alcohol  Use: No  . Drug Use: No  . Sexual Activity: Not on file   Other Topics Concern  . Not on file   Social History Narrative   Patient is married with one child.   Patient is right handed.   Patient has 2 yrs of education.   Patient does not drink caffeine.     PHYSICAL EXAM  Filed Vitals:   09/23/14 1527  BP: 131/93  Pulse: 86  Height: 5' 2"  (1.575 m)  Weight: 148 lb 8 oz (67.359 kg)   Body mass index is 27.15 kg/(m^2). General: The patient is alert and cooperative at the time of the examination.  Skin: No significant peripheral edema is noted.   Neurologic Exam Mental status: The patient is oriented x 3. Cranial nerves: Facial symmetry is present. Speech is normal, no aphasia or dysarthria is noted. Extraocular movements are full. Visual fields are full. Motor: The patient has good strength in all 4 extremities. No focal weakness Sensory examination: Soft touch sensation is symmetric on the face, arms, and legs. Coordination: The patient has good finger-nose-finger and heel-to-shin bilaterally. Gait and station: The patient has a normal gait. Tandem gait is steady . Romberg is negative. No drift is seen. Reflexes: Deep tendon reflexes are symmetric.  DIAGNOSTIC DATA (LABS, IMAGING, TESTING) - ASSESSMENT AND PLAN  54 y.o. year old female  has a past medical history of seizure disorder currently well-controlled on Keppra and Dilantin. Mild memory loss which has improved with medication change. No further amnesia events  Continue Keppra at current dose Continue Dilantin at current dose will refill Will check labs today  call for seizure activity Follow-up in 6 months Dennie Bible, [facility], [facility], [facility] [address], [place], [place] [address] [phone]

## 2014-09-23 NOTE — Patient Instructions (Signed)
Continue Keppra at current dose Continue Dilantin at current dose will refill Will check labs today  call for seizure activity Follow-up in 6 months

## 2014-09-24 ENCOUNTER — Other Ambulatory Visit: Payer: Self-pay | Admitting: Nurse Practitioner

## 2014-09-25 ENCOUNTER — Telehealth: Payer: Self-pay

## 2014-09-25 LAB — CBC WITH DIFFERENTIAL/PLATELET
Basophils Absolute: 0.1 10*3/uL (ref 0.0–0.2)
Basos: 1 %
EOS (ABSOLUTE): 0.2 10*3/uL (ref 0.0–0.4)
Eos: 2 %
Hematocrit: 46.5 % (ref 34.0–46.6)
Hemoglobin: 16.4 g/dL — ABNORMAL HIGH (ref 11.1–15.9)
Immature Grans (Abs): 0 10*3/uL (ref 0.0–0.1)
Immature Granulocytes: 0 %
Lymphocytes Absolute: 2.9 10*3/uL (ref 0.7–3.1)
Lymphs: 35 %
MCH: 31.1 pg (ref 26.6–33.0)
MCHC: 35.3 g/dL (ref 31.5–35.7)
MCV: 88 fL (ref 79–97)
Monocytes Absolute: 0.8 10*3/uL (ref 0.1–0.9)
Monocytes: 10 %
Neutrophils Absolute: 4.2 10*3/uL (ref 1.4–7.0)
Neutrophils: 52 %
Platelets: 181 10*3/uL (ref 150–379)
RBC: 5.28 x10E6/uL (ref 3.77–5.28)
RDW: 14 % (ref 12.3–15.4)
WBC: 8.2 10*3/uL (ref 3.4–10.8)

## 2014-09-25 LAB — COMPREHENSIVE METABOLIC PANEL
ALT: 11 IU/L (ref 0–32)
AST: 15 IU/L (ref 0–40)
Albumin/Globulin Ratio: 2.1 (ref 1.1–2.5)
Albumin: 4.1 g/dL (ref 3.5–5.5)
Alkaline Phosphatase: 134 IU/L — ABNORMAL HIGH (ref 39–117)
BUN/Creatinine Ratio: 13 (ref 9–23)
BUN: 10 mg/dL (ref 6–24)
Bilirubin Total: <0.2 mg/dL (ref 0.0–1.2)
CO2: 23 mmol/L (ref 18–29)
Calcium: 8.9 mg/dL (ref 8.7–10.2)
Chloride: 103 mmol/L (ref 97–108)
Creatinine, Ser: 0.77 mg/dL (ref 0.57–1.00)
GFR calc Af Amer: 101 mL/min/{1.73_m2} (ref >59)
GFR calc non Af Amer: 88 mL/min/{1.73_m2} (ref >59)
Globulin, Total: 2 g/dL (ref 1.5–4.5)
Glucose: 90 mg/dL (ref 65–99)
Potassium: 4 mmol/L (ref 3.5–5.2)
Sodium: 142 mmol/L (ref 134–144)
Total Protein: 6.1 g/dL (ref 6.0–8.5)

## 2014-09-25 LAB — LEVETIRACETAM LEVEL: Levetiracetam Lvl: 12.4 ug/mL (ref 10.0–40.0)

## 2014-09-25 LAB — PHENYTOIN LEVEL, TOTAL: Phenytoin Lvl: 10.6 ug/mL (ref 10.0–20.0)

## 2014-09-25 NOTE — Telephone Encounter (Signed)
Patient informed of good Keppra and Dilantin levels, and to continue dosage as prescribed.  Patient verbalizes understanding.

## 2014-10-27 ENCOUNTER — Other Ambulatory Visit: Payer: Self-pay | Admitting: Cardiovascular Disease

## 2014-10-31 ENCOUNTER — Other Ambulatory Visit: Payer: Self-pay | Admitting: Cardiovascular Disease

## 2014-10-31 NOTE — Telephone Encounter (Signed)
REFILL 

## 2014-11-26 ENCOUNTER — Other Ambulatory Visit: Payer: Self-pay | Admitting: Nurse Practitioner

## 2014-11-26 ENCOUNTER — Other Ambulatory Visit: Payer: Self-pay | Admitting: Cardiovascular Disease

## 2014-11-26 NOTE — Telephone Encounter (Signed)
Rx(s) sent to pharmacy electronically.  

## 2014-11-28 ENCOUNTER — Other Ambulatory Visit: Payer: Self-pay | Admitting: Cardiovascular Disease

## 2014-11-28 ENCOUNTER — Other Ambulatory Visit: Payer: Self-pay | Admitting: Nurse Practitioner

## 2014-11-28 NOTE — Telephone Encounter (Signed)
Rx(s) sent to pharmacy electronically.  

## 2014-12-09 DIAGNOSIS — M4302 Spondylolysis, cervical region: Secondary | ICD-10-CM | POA: Diagnosis not present

## 2014-12-09 DIAGNOSIS — M47816 Spondylosis without myelopathy or radiculopathy, lumbar region: Secondary | ICD-10-CM | POA: Diagnosis not present

## 2015-01-02 DIAGNOSIS — Z23 Encounter for immunization: Secondary | ICD-10-CM | POA: Diagnosis not present

## 2015-01-20 ENCOUNTER — Ambulatory Visit: Payer: Medicare Other | Admitting: Cardiovascular Disease

## 2015-01-20 DIAGNOSIS — J069 Acute upper respiratory infection, unspecified: Secondary | ICD-10-CM | POA: Diagnosis not present

## 2015-02-20 ENCOUNTER — Encounter: Payer: Self-pay | Admitting: Cardiovascular Disease

## 2015-02-26 ENCOUNTER — Encounter: Payer: Self-pay | Admitting: Cardiovascular Disease

## 2015-02-26 ENCOUNTER — Ambulatory Visit (INDEPENDENT_AMBULATORY_CARE_PROVIDER_SITE_OTHER): Payer: Medicare Other | Admitting: Cardiovascular Disease

## 2015-02-26 VITALS — BP 118/82 | HR 75 | Ht 62.0 in | Wt 148.0 lb

## 2015-02-26 DIAGNOSIS — R011 Cardiac murmur, unspecified: Secondary | ICD-10-CM

## 2015-02-26 DIAGNOSIS — Z720 Tobacco use: Secondary | ICD-10-CM | POA: Diagnosis not present

## 2015-02-26 DIAGNOSIS — I10 Essential (primary) hypertension: Secondary | ICD-10-CM | POA: Diagnosis not present

## 2015-02-26 DIAGNOSIS — R002 Palpitations: Secondary | ICD-10-CM

## 2015-02-26 DIAGNOSIS — R9431 Abnormal electrocardiogram [ECG] [EKG]: Secondary | ICD-10-CM

## 2015-02-26 MED ORDER — METOPROLOL SUCCINATE ER 100 MG PO TB24: ORAL_TABLET | ORAL | Status: DC

## 2015-02-26 NOTE — Patient Instructions (Signed)
Your physician has recommended you make the following change in your medication: the metoprolol succ ER 100 mg has been increased to 1 tablet twice a day. A new prescription has been sent to your pharmacy.  Your physician has requested that you have an echocardiogram. Echocardiography is a painless test that uses sound waves to create images of your heart. It provides your doctor with information about the size and shape of your heart and how well your heart's chambers and valves are working. This procedure takes approximately one hour. There are no restrictions for this procedure.   Your physician recommends that you schedule a follow-up appointment in 3-4 months with Dr. Claiborne Billings.

## 2015-02-28 ENCOUNTER — Encounter: Payer: Self-pay | Admitting: Cardiovascular Disease

## 2015-02-28 DIAGNOSIS — R002 Palpitations: Secondary | ICD-10-CM | POA: Insufficient documentation

## 2015-02-28 NOTE — Progress Notes (Signed)
Patient ID: Wendy Lopez, female   DOB: 1961/02/08, 54 y.o.   MRN: [id number]     HPI: Wendy Lopez is a 54 y.o. female who presents for a 10 month cardiology follow-up evaluation.   Wendy Lopez has a long-standing history of tobacco abuse and started smoking at age 67.  In 2009 cardiac catheterization revealed normal coronary arteries. In February 2012 a nuclear perfusion study was done after she experienced episodes of chest pain and shortness of breath and this revealed normal perfusion. An echo Doppler study demonstrated mild pulmonary hypertension with estimated RV systolic pressure of 36 mm. Because of issues of recurrent shortness of breath she underwent a cardiopulmonary met test and was found to have a blunted chronotropic response to exercise which is making it difficult for her to improve her endurance and exercise capacity. She has a history of tachypalpitations. She has a history of invasive ductal carcinoma and is status post right mastectomy with sentinel node biopsy and is status post chemotherapy. She has a history of vitamin D insufficiency mild blood pressure elevation. There is also a history of depression.  On 10/16/2012 , a 2-D echo Doppler study  revealed an ejection fraction in the range of 60-65%. She did not have wall motion abnormalities and had normal diastolic function. There was evidence for right ventricular hypertrophy with normal RV function. She again had mild pulmonary hypertension with an estimated pressure 39 mm. There is moderate tricuspid regurgitation. Wendy Lopez continues to smoke cigarettes. She does note some indigestion. He also has noticed some left leg weakness and has issues with L4-L5 disc disease. She previously was on a higher dose of present Brintellex but due to tremors she has reduced his dose to 5 mg daily.  A subsequent nuclear perfusion study was done on 11/20/2012 after her ECG revealed slight additional T-wave abnormalities and was  essentially normal without wall motion abnormality, scar or ischemia.  She has a history of cervical disc disease and had noted some upper back discomfort in the past.  In December 2015, her EKG had shown  more pronounced downsloping ST segment depression in the inferior to inferolateral leads.  I recommended a subsequent nuclear perfusion study.  This was done on 04/17/2014 , which was not significant change from her previous study.  She again had basal inferolateral ST-T changes with T-wave inversion in V3 through V4 which did not significant change with stress.  She had normal perfusion with post-rest ejection fraction at 65%.  She has continued to smoke cigarettes currently one half pack per day.  She recently has had some difficulty.  Once she was taken off Lamictal and substituted with Keppra due to cost.  She has noticed more palpitations and her heart rate being more increased.  She denies recent chest pressure.  She denies any recent seizure activity.  She is followed by Dr. Jannifer Franklin.  She also has a history of intolerable ability to amlodipine.  She presents for follow-up evaluation.  Past Medical History  Diagnosis Date  . Spinal fusion failure ([place])     3  . Mitral valve prolapse 06/01/10    echo- EF>55% mild tricupsid regurgitation. There is mild Pulmonary Hypertension.. Right  ventricular systolic pressure iselevated at 30-40 mmHg. LV systolic function is normal.  . Arthritis   . Breast cancer ([place]) 02/13/2009  . Dysrhythmia     palpitations  . H/O hiatal hernia   . Epilepsy ([place])     last seizure 1990's  . PONV (  postoperative nausea and vomiting)   . Osteopenia   . Depression   . Cancer of right breast ([place]) 04/16/2012  . Depression 2014  . SOB (shortness of breath) 10/18/11    met-test  . Memory loss   . Anxiety and depression   . Localization-related (focal) (partial) epilepsy and epileptic syndromes with complex partial seizures, without mention of intractable epilepsy  02/07/2013  . Memory deficit 02/07/2013    Past Surgical History  Procedure Laterality Date  . Tonsillectomy    . Spine surgery  1999, 2003, 2005  . Hand tendon surgery  2012    tendons released in right hand   . Mastectomy, radical  02/13/2009  . Back surgery      screws- lumbar  . Breast surgery  2010    mastectomy - RIGHT  . Colonoscopy with propofol  03/13/2012    Procedure: COLONOSCOPY WITH PROPOFOL;  Surgeon: Garlan Fair, MD;  Location: [facility];  Service: Endoscopy;  Laterality: N/A;  . Esophagogastroduodenoscopy  03/13/2012    Procedure: ESOPHAGOGASTRODUODENOSCOPY (EGD);  Surgeon: Garlan Fair, MD;  Location: [facility];  Service: Endoscopy;  Laterality: N/A;  Reflux, Hiatal Hernia    Allergies  Allergen Reactions  . Carbamazepine Anaphylaxis  . Iodine Anaphylaxis and Rash  . Lamictal [Lamotrigine] Anaphylaxis    SOB,   . Other     STATES HAS SHORTNESS OF BREATH WITH GENERIC DRUGS     ALSO RASH AND ITCHING  . Pseudoephedrine Hcl Er Other (See Comments)    SEIZURES.  . Valium Other (See Comments)    Hallucinations, spasticity, hyper-relfexive   . Amlodipine Rash    Face swell  . Oxycodone Nausea And Vomiting and Rash  . Prednisone Rash and Other (See Comments)    Prescribing MD told patient he thinks dose (dose pack) was "just too high." 03/09/11   STATES HAS TAKEN SMALLER DOSE AND STILL HAD RASH AND JITTERINESS  . Ibuprofen Swelling  . Percocet [Oxycodone-Acetaminophen] Nausea And Vomiting  . Latex Dermatitis and Rash    Current Outpatient Prescriptions  Medication Sig Dispense Refill  . aspirin-acetaminophen-caffeine (EXCEDRIN EXTRA STRENGTH) 250-250-65 MG per tablet Take 1 tablet by mouth every 6 (six) hours as needed. Pain    . cetirizine (ZYRTEC) 10 MG tablet Take 10 mg by mouth daily.    . cyanocobalamin 100 MCG tablet Take 100 mcg by mouth daily.    Marland Kitchen HYDROcodone-acetaminophen (NORCO/VICODIN) 5-325 MG per tablet Take 1 tablet by mouth  every 6 (six) hours as needed for pain.    Marland Kitchen KEPPRA 500 MG tablet TAKE 1 TABLET BY MOUTH TWICE A DAY 60 tablet 6  . metoprolol succinate (TOPROL XL) 100 MG 24 hr tablet Take 1 tablet twice a day. 60 tablet 6  . phenytoin (DILANTIN) 100 MG ER capsule Take 1 capsule (100 mg total) by mouth 3 (three) times daily. 90 capsule 6   No current facility-administered medications for this visit.    Social History   Social History  . Marital Status: Married    Spouse Name: N/A  . Number of Children: 1  . Years of Education: college 2   Occupational History  . unemployed     2003   Social History Main Topics  . Smoking status: Current Every Day Smoker -- 0.50 packs/day    Types: Cigarettes  . Smokeless tobacco: Never Used     Comment: 1/2 pk per day  . Alcohol Use: No  . Drug Use: No  . Sexual  Activity: Not on file   Other Topics Concern  . Not on file   Social History Narrative   Patient is married with one child.   Patient is right handed.   Patient has 2 yrs of education.   Patient does not drink caffeine.    Social she is married and has one child. She's been smoking since age 62.  ROS General: Negative; No fevers, chills, or night sweats;  HEENT: Negative; No changes in vision or hearing, sinus congestion, difficulty swallowing Pulmonary: Negative; No cough, wheezing, shortness of breath, hemoptysis Cardiovascular: Negative; No chest pain, presyncope, syncope, palpitations GI: Negative; No nausea, vomiting, diarrhea, or abdominal pain GU: Negative; No dysuria, hematuria, or difficulty voiding Musculoskeletal: Negative; no myalgias, joint pain, or weakness Hematologic/Oncology: Remote history of stage I breast CA of the right breast with recent evaluation felt to be stable. Endocrine: Negative; no heat/cold intolerance; no diabetes Neuro: Remote history of seizure disorder for which she had been on chronic Lamictal and Dilantin and is followed now by Dr. Jannifer Franklin.  When  previously she had been followed by Dr. Erling Cruz.  She recently was switched to [place] due to cost since she could not take generic Lamictal.  She believes she has had some issues.  Once Her was initiated.   Skin: Negative; No rashes or skin lesions Psychiatric: Negative; No behavioral problems, depression Sleep: Negative; No snoring, daytime sleepiness, hypersomnolence, bruxism, restless legs, hypnogognic hallucinations, no cataplexy Other comprehensive 14 point system review is negative.   PE BP 118/82 mmHg  Pulse 75  Ht 5' 2"  (1.575 m)  Wt 148 lb (67.132 kg)  BMI 27.06 kg/m2  LMP 11/03/1993  Repeat blood pressure by me was 130/78 General: Alert, oriented, no distress.  Skin: normal turgor, no rashes HEENT: Normocephalic, atraumatic. Pupils round and reactive; sclera anicteric;no lid lag.  Nose without nasal septal hypertrophy Mouth/Parynx benign; Mallinpatti scale 2 Neck: No JVD, no carotid bruits with normal carotid upstroke Chest, status post right mastectomy Lungs: decreased BS; no wheezing or rales Heart: RRR, s1 s2 normal 1/6 sem at apex; no ectopy; no diastolic murmur.  No rubs thrills or heaves Abdomen: soft, nontender; no hepatosplenomehaly, BS+; abdominal aorta nontender and not dilated by palpation. Pulses 2+ Extremities: no clubbing cyanosis or edema, Homan's sign negative  Neurologic: grossly nonfocal Psychological: Normal affect and mood  ECG (independently read by me): Normal sinus rhythm at 75 bpm.  Inferolateral ST-T wave abnormalities.  03/31/2014 ECG (independently read by me) : Normal sinus rhythm at 75 bpm.  There is now significantly more pronounced downsloping ST segment depression in leads II, III, and F V4 through V6 and T-wave inversion in V3 compared to her prior ECG of over one year ago.  Prior ECG: Normal sinus rhythm at 68. T-wave abnormalities  inferiorly in V3 through V6  LABS:  BMET  BMP Latest Ref Rng 09/23/2014 03/18/2014 07/23/2013  Glucose 65 -  99 mg/dL 90 86 91  BUN 6 - 24 mg/dL 10 9 9   Creatinine 0.57 - 1.00 mg/dL 0.77 0.77 0.83  BUN/Creat Ratio 9 - 23 13 - -  Sodium 134 - 144 mmol/L 142 141 143  Potassium 3.5 - 5.2 mmol/L 4.0 4.2 4.3  Chloride 97 - 108 mmol/L 103 102 104  CO2 18 - 29 mmol/L 23 26 28   Calcium 8.7 - 10.2 mg/dL 8.9 9.1 9.2      Component Value Date/Time   PROT 6.1 09/23/2014 1620   PROT 7.1 03/18/2014 1410  ALBUMIN 4.1 09/23/2014 1620   ALBUMIN 4.0 03/18/2014 1410   AST 15 09/23/2014 1620   ALT 11 09/23/2014 1620   ALKPHOS 134* 09/23/2014 1620   BILITOT <0.2 09/23/2014 1620   BILITOT 0.3 03/18/2014 1410    CBC Latest Ref Rng 09/23/2014 03/18/2014 07/23/2013  WBC 3.4 - 10.8 x10E3/uL 8.2 8.0 6.4  Hemoglobin 12.0 - 15.0 g/dL - 16.5(H) 15.4(H)  Hematocrit 34.0 - 46.6 % 46.5 46.8(H) 44.7  Platelets 150 - 400 K/uL - 184 170   Lab Results  Component Value Date   MCV 88.0 03/18/2014   MCV 88.5 07/23/2013   MCV 90 02/07/2013   Lab Results  Component Value Date   TSH 0.676 08/26/2011  No results found for: HGBA1C   Lipid Panel  No results found for: CHOL, TRIG, HDL, CHOLHDL, VLDL, LDLCALC, LDLDIRECT  RADIOLOGY: No results found.    ASSESSMENT AND PLAN: Wendy Lopez is a 54 year old female with a 40 year tobacco history.  In January 2009 prior cardiac catheterization at the [facility] revealed normal coronary arteries. A nuclear perfusion study in 2012 showed normal perfusion. A cardiopulmonary met test in July 2013 showed blunted chronotropic response without ischemic changes but she did have reduced functional status with peak O2 at 69% of predicted. She has mild pulmonary hypertension.  An echo revealed normal function without wall motion abnormalities despite her ECG changes.  Her last stress test which was done because of more abnormal ST-T changes.  Was not significant change from previously.  She has noticed increasing palpitations particularly when she started Keppra in place of  Lamictal for seizure disorder.  She has been taking Toprol 150 milligrams in the morning.  I will further adjust her medication and she will now take Toprol 100 mg in the morning and 100 mg in the evening.  We again discussed the importance of smoking cessation.  She's not had any seizure activity and continues to take Dilantin with Keppra.  Her QTc interval was 413 ms.  She is not having any anginal type symptoms.  She does have cervical disc disease, which occasionally attributes to some upper back discomfort.  I am recommending a follow-up echo Doppler study in light of her palpitations to follow-up systolic and diastolic function, valvular architecture and chamber dimensions.  I will see her in the office in 3-4 months for reevaluation.    Time spent: 25 minutes  Troy Sine, MD, [facility]  02/28/2015 10:02 AM

## 2015-03-05 ENCOUNTER — Other Ambulatory Visit (HOSPITAL_COMMUNITY): Payer: Medicare Other

## 2015-03-17 ENCOUNTER — Other Ambulatory Visit: Payer: Self-pay

## 2015-03-17 ENCOUNTER — Ambulatory Visit (HOSPITAL_COMMUNITY): Payer: Medicare Other | Attending: Cardiovascular Disease

## 2015-03-17 DIAGNOSIS — R002 Palpitations: Secondary | ICD-10-CM | POA: Diagnosis not present

## 2015-03-17 DIAGNOSIS — I071 Rheumatic tricuspid insufficiency: Secondary | ICD-10-CM | POA: Diagnosis not present

## 2015-03-17 DIAGNOSIS — I10 Essential (primary) hypertension: Secondary | ICD-10-CM

## 2015-03-17 DIAGNOSIS — I340 Nonrheumatic mitral (valve) insufficiency: Secondary | ICD-10-CM | POA: Insufficient documentation

## 2015-03-17 DIAGNOSIS — R011 Cardiac murmur, unspecified: Secondary | ICD-10-CM

## 2015-03-20 ENCOUNTER — Ambulatory Visit (HOSPITAL_COMMUNITY): Payer: Medicare Other | Admitting: Oncology

## 2015-03-24 DIAGNOSIS — Z853 Personal history of malignant neoplasm of breast: Secondary | ICD-10-CM | POA: Diagnosis not present

## 2015-03-24 DIAGNOSIS — Z1231 Encounter for screening mammogram for malignant neoplasm of breast: Secondary | ICD-10-CM | POA: Diagnosis not present

## 2015-03-25 ENCOUNTER — Ambulatory Visit: Payer: Medicare Other | Admitting: Nurse Practitioner

## 2015-03-26 ENCOUNTER — Ambulatory Visit: Payer: Medicare Other | Admitting: Nurse Practitioner

## 2015-03-30 DIAGNOSIS — M47816 Spondylosis without myelopathy or radiculopathy, lumbar region: Secondary | ICD-10-CM | POA: Diagnosis not present

## 2015-03-30 DIAGNOSIS — M4302 Spondylolysis, cervical region: Secondary | ICD-10-CM | POA: Diagnosis not present

## 2015-03-31 ENCOUNTER — Ambulatory Visit (HOSPITAL_COMMUNITY): Payer: Medicare Other | Admitting: Oncology

## 2015-04-06 NOTE — Progress Notes (Signed)
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 female returns for  regular  visit for followup of breast cancer, stage I, ER +99%, PR +54%, Ki-67 marker 15%, HER-2/neu nonamplified. Oncotype score was intermediate and she was offered chemotherapy. All 3 nodes were negative. She took 3 cycles out of 4 of Taxotere/Cytoxan under the care of Dr. Sherryl Manges but stopped due to intolerance due to her dose being based upon the wrong weight (198 lbs instead of 142 lbs) and she subsequently became very toxic from this treatment.  She was then placed on Arimidex which she stopped due to intolerance.  She notes that she took AI therapy for approximately 6 months before it was discontinued due to intolerance with contractures of hands bilaterally requiring surgical intervention.  She admits to having negative genetic testing in the past, despite a stong family history of malignancy.    Cancer of right breast ([place])   01/13/2009 Initial Diagnosis Needle core biopsy of 12 o'clock mass in right breast and axillary lymph node demonstrating invasive mammary carcinoma with negative lymph node.  ER 99%, PR 54%, Ki-67 15%, and Her2  negative.   02/13/2009 Definitive Surgery Right simple mastectomy by Dr. Dalbert Batman demonstrating a 1.7 cm invasive ductal carcinoma, grade 1, no LVI, clear margins and 0/3 sentinel lymph nodes for metastatic disease.    I personally reviewed and went over laboratory results with the patient.  The results are noted within this dictation.    I personally reviewed and went over radiographic studies with the patient.  The results are noted within this dictation.  Last mammogram performed on 03/20/2014 was NEGATIVE.  She reports that she had another at [facility] on [facility]24/2015.  I do not have that result  available to me.  We will try to get a copy.  She denies any complaints.  She is educated on monitoring for breast cancer recurrence.  She knows that annual mammography is recommended.  Continued lab work from an oncology standpoint is not necessary.  She has lab work performed in June 2016 that was unimpressive, except for an elevated HGB with a ULN RBC secondary to her continued tobacco abuse.  She continues to smoke 1/2 ppd  Past Medical History  Diagnosis Date  . 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 Mitral valve prolapse 06/01/10    echo- EF>55% mild tricupsid regurgitation. There is mild Pulmonary Hypertension.. Right  ventricular systolic pressure iselevated at 30-40 mmHg. LV systolic function is normal.  . Arthritis   . Breast cancer ([place]) 02/13/2009  . Dysrhythmia     palpitations  . H/O hiatal hernia   . Epilepsy ([place])     last seizure 1990's  . PONV (postoperative nausea and vomiting)   . Osteopenia   . Depression   . Cancer of right breast ([place]) 04/16/2012  . Depression 2014  . SOB (shortness of breath) 10/18/11    met-test  . Memory loss   . Anxiety and depression   . Localization-related (focal) (partial) epilepsy and epileptic syndromes with complex partial seizures, without mention of intractable epilepsy 02/07/2013  . Memory deficit 02/07/2013    has Cancer of right breast ([place]); HTN (hypertension); Lumbar disc disease; Tobacco abuse; Partial epilepsy with impairment of consciousness ([place]); Memory deficit; Abnormal ECG; Chest pain;  and Palpitations on her problem list.     is allergic to carbamazepine; iodine; lamictal; other; pseudoephedrine hcl er; valium; amlodipine; oxycodone; prednisone; ibuprofen; percocet; and latex.  Ms. Feely does not currently have medications on file.  Past Surgical History  Procedure Laterality Date  . Tonsillectomy    . Spine surgery  1999, 2003, 2005  . Hand tendon surgery  2012    tendons released in right hand   .  Mastectomy, radical  02/13/2009  . Back surgery      screws- lumbar  . Breast surgery  2010    mastectomy - RIGHT  . Colonoscopy with propofol  03/13/2012    Procedure: COLONOSCOPY WITH PROPOFOL;  Surgeon: Garlan Fair, MD;  Location: [facility];  Service: Endoscopy;  Laterality: N/A;  . Esophagogastroduodenoscopy  03/13/2012    Procedure: ESOPHAGOGASTRODUODENOSCOPY (EGD);  Surgeon: Garlan Fair, MD;  Location: [facility];  Service: Endoscopy;  Laterality: N/A;  Reflux, Hiatal Hernia    Denies any headaches, dizziness, double vision, fevers, chills, night sweats, nausea, vomiting, diarrhea, constipation, chest pain, heart palpitations, shortness of breath, blood in stool, black tarry stool, urinary pain, urinary burning, urinary frequency, hematuria.   PHYSICAL EXAMINATION  ECOG PERFORMANCE STATUS: 0 - Asymptomatic  Filed Vitals:   04/07/15 1438  BP: 124/76  Pulse: 75  Temp: 98.2 F (36.8 C)  Resp: 16    GENERAL:alert, no distress, well nourished, well developed, comfortable, cooperative, smiling and thickened facial skin from smoking, unaccompanied SKIN: skin color, texture, turgor are normal, no rashes or significant lesions HEAD: Normocephalic, No masses, lesions, tenderness or abnormalities EYES: normal, PERRLA, EOMI, Conjunctiva are pink and non-injected EARS: External ears normal OROPHARYNX:lips, buccal mucosa, and tongue normal and mucous membranes are moist with some mild posterior pharynx erythema  NECK: supple, no adenopathy, thyroid normal size, non-tender, without nodularity, no stridor, non-tender, trachea midline LYMPH:  no palpable lymphadenopathy, no hepatosplenomegaly BREAST: left breast normal without obvious mass, skin or nipple changes or axillary nodes, some thickened fibroglandular tissues noted in the 2 o'clock position  6 cm from areola without any obvious nodule/mass/lesion, right post-mastectomy site well healed and free of suspicious  changes LUNGS: clear to auscultation and percussion, decreased breath sounds HEART: regular rate & rhythm, no murmurs, no gallops, S1 normal and S2 normal ABDOMEN:abdomen soft, non-tender, normal bowel sounds and no masses or organomegaly BACK: Back symmetric, no curvature. EXTREMITIES:less then 2 second capillary refill, no joint deformities, effusion, or inflammation, no edema, no skin discoloration, no cyanosis  NEURO: alert & oriented x 3 with fluent speech, no focal motor/sensory deficits, gait normal   LABORATORY DATA: CBC    Component Value Date/Time   WBC 8.2 09/23/2014 1620   WBC 8.0 03/18/2014 1410   WBC 6.2 12/28/2009 0941   RBC 5.28 09/23/2014 1620   RBC 5.32* 03/18/2014 1410   RBC 5.07 12/28/2009 0941   HGB 16.5* 03/18/2014 1410   HGB 16.4* 12/28/2009 0941   HCT 46.5 09/23/2014 1620   HCT 46.8* 03/18/2014 1410   HCT 47.1* 12/28/2009 0941   PLT 184 03/18/2014 1410   PLT 170 12/28/2009 0941   MCV 88.0 03/18/2014 1410   MCV 92.8 12/28/2009 0941   MCH 31.1 09/23/2014 1620   MCH 31.0 03/18/2014 1410   MCH 32.3 12/28/2009 0941   MCHC 35.3 09/23/2014 1620   MCHC 35.3 03/18/2014 1410   MCHC 34.8 12/28/2009 0941   RDW 14.0 09/23/2014 1620   RDW 13.3 03/18/2014 1410   RDW 12.8  12/28/2009 0941   LYMPHSABS 2.9 09/23/2014 1620   LYMPHSABS 3.4 03/18/2014 1410   LYMPHSABS 2.0 12/28/2009 0941   MONOABS 0.7 03/18/2014 1410   MONOABS 0.6 12/28/2009 0941   EOSABS 0.2 03/18/2014 1410   EOSABS 0.2 02/07/2013 1004   BASOSABS 0.1 09/23/2014 1620   BASOSABS 0.1 03/18/2014 1410   BASOSABS 0.1 12/28/2009 0941      Chemistry      Component Value Date/Time   NA 142 09/23/2014 1620   NA 141 03/18/2014 1410   K 4.0 09/23/2014 1620   CL 103 09/23/2014 1620   CO2 23 09/23/2014 1620   BUN 10 09/23/2014 1620   BUN 9 03/18/2014 1410   CREATININE 0.77 09/23/2014 1620      Component Value Date/Time   CALCIUM 8.9 09/23/2014 1620   ALKPHOS 134* 09/23/2014 1620   AST 15  09/23/2014 1620   ALT 11 09/23/2014 1620   BILITOT <0.2 09/23/2014 1620   BILITOT 0.3 03/18/2014 1410     Lab Results  Component Value Date   LABCA2 24 07/23/2013      ASSESSMENT/PLAN:   Tobacco abuse  Jak-2 negative Erythrocytosis, likely secondary to lung disease from tobacco abuse. Smoking cessation education is provided.  She continues to smoke 1/2 ppd.  Cancer of right breast ([place]) Stage I invasive breast cancer, right breast, ER +99%, PR +54%, Ki-67 marker 15%, HER-2/neu nonamplified. Oncotype score was intermediate and she was offered chemotherapy. All 3 nodes were negative. She took 3 cycles out of 4 of Taxotere/Cytoxan under the care of Dr. Sherryl Manges but stopped due to intolerance due to her dose being based upon the wrong weight (198 lbs instead of 142 lbs) and she subsequently became very toxic from this treatment.  She was then placed on Arimidex which she stopped due to intolerance.   She notes that she took AI therapy for approximately 6 months before it was discontinued due to intolerance with contractures of hands bilaterally requiring surgical intervention.  She admits to having negative genetic testing in the past, despite a stong family history of malignancy.  Oncology history developed based upon available information to me.  Staging in [facility] problem list is complete as well.  Will try to get mammogram results from 03/24/2015.  She is encouraged to re-consider where she gets her mammograms.  She is educated that [facility] does mammograms and we have the same technology, including 3D mammography here locally, thus the need to travel to [place] is not relevant.  Rx for mastectomy bras and prostheses provided today.  No further role for oncology labs.  Patient has upcoming appointments with other providers and I suspect they will be performing labs.  Patient is welcome to call us for interval labs if she wishes.  Return in 12 months for follow-up.      THERAPY PLAN:   NCCN guidelines recommends the following surveillance for invasive breast cancer:  A. History and Physical exam every 4-6 months for 5 years and then every 12 months.  B. Mammography every 12 months  C. Women on Tamoxifen: annual gynecologic assessment every 12 months if uterus is present.  D. Women on aromatase inhibitor or who experience ovarian failure secondary to treatment should have monitoring of bone health with a bone mineral density determination at baseline and periodically thereafter.  E. Assess and encourage adherence to adjuvant endocrine therapy.  F. Evidence suggests that active lifestyle and achieving and maintaining an ideal body weight (20-25 BMI) may lead to optimal  breast cancer outcomes.   All questions were answered. The patient knows to call the clinic with any problems, questions or concerns. We can certainly see the patient much sooner if necessary.  Patient and plan discussed with Dr. Ancil Linsey and she is in agreement with the aforementioned.   Tamzin Bertling 04/07/2015 5:36 PM

## 2015-04-06 NOTE — Assessment & Plan Note (Addendum)
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 Smoking cessation education is provided.  She continues to smoke 1/2 ppd.

## 2015-04-07 ENCOUNTER — Encounter (HOSPITAL_COMMUNITY): Payer: Self-pay | Admitting: Oncology

## 2015-04-07 ENCOUNTER — Encounter (HOSPITAL_COMMUNITY): Payer: Medicare Other | Attending: Oncology | Admitting: Oncology

## 2015-04-07 VITALS — BP 124/76 | HR 75 | Temp 98.2°F | Resp 16 | Wt 146.1 lb

## 2015-04-07 DIAGNOSIS — Z720 Tobacco use: Secondary | ICD-10-CM | POA: Diagnosis not present

## 2015-04-07 DIAGNOSIS — C50111 Malignant neoplasm of central portion of right female breast: Secondary | ICD-10-CM

## 2015-04-07 NOTE — Assessment & Plan Note (Addendum)
Stage I invasive breast cancer, right breast, ER +99%, PR +54%, Ki-67 marker 15%, HER-2/neu nonamplified. Oncotype score was intermediate and she was offered chemotherapy. All 3 nodes were negative. She took 3 cycles out of 4 of Taxotere/Cytoxan under the care of Dr. Sherryl Manges but stopped due to intolerance due to her dose being based upon the wrong weight (198 lbs instead of 142 lbs) and she subsequently became very toxic from this treatment.  She was then placed on Arimidex which she stopped due to intolerance.   She notes that she took AI therapy for approximately 6 months before it was discontinued due to intolerance with contractures of hands bilaterally requiring surgical intervention.  She admits to having negative genetic testing in the past, despite a stong family history of malignancy.  Oncology history developed based upon available information to me.  Staging in [facility] problem list is complete as well.  Will try to get mammogram results from 03/24/2015.  She is encouraged to re-consider where she gets her mammograms.  She is educated that [facility] does mammograms and we have the same technology, including 3D mammography here locally, thus the need to travel to [place] is not relevant.  Rx for mastectomy bras and prostheses provided today.  No further role for oncology labs.  Patient has upcoming appointments with other providers and I suspect they will be performing labs.  Patient is welcome to call us for interval labs if she wishes.  Return in 12 months for follow-up.

## 2015-04-07 NOTE — Patient Instructions (Signed)
[place] at [facility] Discharge Instructions  RECOMMENDATIONS MADE BY THE CONSULTANT AND ANY TEST RESULTS WILL BE SENT TO YOUR REFERRING PHYSICIAN.  Exam and discussion by Robynn Pane, PA-C Report any new lumps, bone pain, shortness of breath or other symptoms. Consider getting your mammograms here.  If you decide you want lab work call us and let us know.  Follow-up in 1 year.  Thank you for choosing [place] at [facility] to provide your oncology and hematology care.  To afford each patient quality time with our provider, please arrive at least 15 minutes before your scheduled appointment time.    You need to re-schedule your appointment should you arrive 10 or more minutes late.  We strive to give you quality time with our providers, and arriving late affects you and other patients whose appointments are after yours.  Also, if you no show three or more times for appointments you may be dismissed from the clinic at the providers discretion.     Again, thank you for choosing [facility].  Our hope is that these requests will decrease the amount of time that you wait before being seen by our physicians.       _____________________________________________________________  Should you have questions after your visit to [facility], please contact our office at [phone] between the hours of 8:30 a.m. and 4:30 p.m.  Voicemails left after 4:30 p.m. will not be returned until the following business day.  For prescription refill requests, have your pharmacy contact our office.

## 2015-04-16 ENCOUNTER — Ambulatory Visit: Payer: Medicare Other | Admitting: Nurse Practitioner

## 2015-04-21 ENCOUNTER — Encounter: Payer: Self-pay | Admitting: Nurse Practitioner

## 2015-04-21 ENCOUNTER — Other Ambulatory Visit: Payer: Self-pay | Admitting: *Deleted

## 2015-04-21 ENCOUNTER — Ambulatory Visit (INDEPENDENT_AMBULATORY_CARE_PROVIDER_SITE_OTHER): Payer: Medicare Other | Admitting: Nurse Practitioner

## 2015-04-21 VITALS — BP 127/79 | HR 75 | Ht 62.0 in | Wt 147.6 lb

## 2015-04-21 DIAGNOSIS — G40209 Localization-related (focal) (partial) symptomatic epilepsy and epileptic syndromes with complex partial seizures, not intractable, without status epilepticus: Secondary | ICD-10-CM

## 2015-04-21 DIAGNOSIS — R413 Other amnesia: Secondary | ICD-10-CM | POA: Diagnosis not present

## 2015-04-21 MED ORDER — METOPROLOL SUCCINATE ER 100 MG PO TB24: ORAL_TABLET | ORAL | Status: DC

## 2015-04-21 MED ORDER — KEPPRA 500 MG PO TABS: 500.0000 mg | ORAL_TABLET | Freq: Two times a day (BID) | ORAL | Status: DC

## 2015-04-21 MED ORDER — PHENYTOIN SODIUM EXTENDED 100 MG PO CAPS: 100.0000 mg | ORAL_CAPSULE | Freq: Three times a day (TID) | ORAL | Status: DC

## 2015-04-21 NOTE — Patient Instructions (Signed)
Continue Keppra and Dilantin at current dose Follow-up in 6 months

## 2015-04-21 NOTE — Progress Notes (Signed)
I have read the note, and I agree with the clinical assessment and plan.  Wendy Lopez,Wendy Lopez   

## 2015-04-21 NOTE — Progress Notes (Signed)
[facility]  PATIENT: Wendy Lopez DOB: 13-Aug-1960   REASON FOR VISIT: Follow-up for partial epilepsy, memory deficit HISTORY FROM:patient    HISTORY OF PRESENT ILLNESS:Wendy Lopez is a 55 year old right-handed white female with a history of seizures. She was last seen in this office 09/23/14.  The patient has been on Lamictal and Dilantin. Her Dilantin dose  was decreased to 300 mg at bedtime and since that time, she feels that she has done better with the vision. The patient no longer seen a purple haze. She indicates that her cognitive abilities have improved, and her gait stability has improved. The patient has not had any further amnestic events. She is now back to driving a motor vehicle. When last seen she was on brand Lamictal but unable to afford the cost due to doughnut hole. She switch to generic and had side effects, . of dizziness headache and tingling in the hands. She was then switched to [place]. She is also on brand Dilantin she claims she has had side effects to generics in the past. She returns for reevaluation  and refills. Last Dilantin level 10..6 and Keppra 12.4 on  09/23/2014.     REVIEW OF SYSTEMS: Full 14 system review of systems performed and notable only for those listed, all others are neg:  Constitutional: Fatigue  Cardiovascular: Palpitations Ear/Nose/Throat: neg  Skin: neg Eyes: Blurred vision Respiratory: neg Gastroitestinal: neg  Hematology/Lymphatic: neg  Endocrine: neg Musculoskeletal:neg Allergy/Immunology: neg Neurological: neg Psychiatric: neg Sleep : neg   ALLERGIES: Allergies  Allergen Reactions  . Carbamazepine Anaphylaxis  . Iodine Anaphylaxis and Rash  . Lamictal [Lamotrigine] Anaphylaxis    SOB, Lamotrigine only  . Other     STATES HAS SHORTNESS OF BREATH WITH GENERIC DRUGS     ALSO RASH AND ITCHING  . Pseudoephedrine Hcl Er Other (See Comments)    SEIZURES.  . Valium Other (See Comments)   Hallucinations, spasticity, hyper-relfexive   . Amlodipine Rash    Face swell  . Oxycodone Nausea And Vomiting and Rash  . Prednisone Rash and Other (See Comments)    Prescribing MD told patient he thinks dose (dose pack) was "just too high." 03/09/11   STATES HAS TAKEN SMALLER DOSE AND STILL HAD RASH AND JITTERINESS  . Ibuprofen Swelling  . Percocet [Oxycodone-Acetaminophen] Nausea And Vomiting  . Latex Dermatitis and Rash    HOME MEDICATIONS: Outpatient Prescriptions Prior to Visit  Medication Sig Dispense Refill  . aspirin-acetaminophen-caffeine (EXCEDRIN EXTRA STRENGTH) 250-250-65 MG per tablet Take 1 tablet by mouth every 6 (six) hours as needed. Pain    . cetirizine (ZYRTEC) 10 MG tablet Take 10 mg by mouth daily.    . cyanocobalamin 100 MCG tablet Take 100 mcg by mouth daily.    Marland Kitchen HYDROcodone-acetaminophen (NORCO/VICODIN) 5-325 MG per tablet Take 1 tablet by mouth every 6 (six) hours as needed for pain. Reported on 04/07/2015    . KEPPRA 500 MG tablet TAKE 1 TABLET BY MOUTH TWICE A DAY 60 tablet 6  . metoprolol succinate (TOPROL XL) 100 MG 24 hr tablet Take 1 tablet twice a day. (Patient taking differently: Take 150 mg by mouth daily. Take 1 tablet twice a day.) 60 tablet 6  . phenytoin (DILANTIN) 100 MG ER capsule Take 1 capsule (100 mg total) by mouth 3 (three) times daily. 90 capsule 6   No facility-administered medications prior to visit.    PAST MEDICAL HISTORY: Past Medical History  Diagnosis Date  . Spinal fusion  failure ([place])     3  . Mitral valve prolapse 06/01/10    echo- EF>55% mild tricupsid regurgitation. There is mild Pulmonary Hypertension.. Right  ventricular systolic pressure iselevated at 30-40 mmHg. LV systolic function is normal.  . Arthritis   . Breast cancer ([place]) 02/13/2009  . Dysrhythmia     palpitations  . H/O hiatal hernia   . Epilepsy ([place])     last seizure 1990's  . PONV (postoperative nausea and vomiting)   . Osteopenia   . Depression     . Cancer of right breast ([place]) 04/16/2012  . Depression 2014  . SOB (shortness of breath) 10/18/11    met-test  . Memory loss   . Anxiety and depression   . Localization-related (focal) (partial) epilepsy and epileptic syndromes with complex partial seizures, without mention of intractable epilepsy 02/07/2013  . Memory deficit 02/07/2013    PAST SURGICAL HISTORY: Past Surgical History  Procedure Laterality Date  . Tonsillectomy    . Spine surgery  1999, 2003, 2005  . Hand tendon surgery  2012    tendons released in right hand   . Mastectomy, radical  02/13/2009  . Back surgery      screws- lumbar  . Breast surgery  2010    mastectomy - RIGHT  . Colonoscopy with propofol  03/13/2012    Procedure: COLONOSCOPY WITH PROPOFOL;  Surgeon: Garlan Fair, MD;  Location: [facility];  Service: Endoscopy;  Laterality: N/A;  . Esophagogastroduodenoscopy  03/13/2012    Procedure: ESOPHAGOGASTRODUODENOSCOPY (EGD);  Surgeon: Garlan Fair, MD;  Location: [facility];  Service: Endoscopy;  Laterality: N/A;  Reflux, Hiatal Hernia    FAMILY HISTORY: Family History  Problem Relation Age of Onset  . Arthritis Mother     rhuematoid  . Heart disease Mother     Atrial fib  . Heart disease Father   . Heart disease Sister     Atrial fib    SOCIAL HISTORY: Social History   Social History  . Marital Status: Married    Spouse Name: N/A  . Number of Children: 1  . Years of Education: college 2   Occupational History  . unemployed     2003   Social History Main Topics  . Smoking status: Current Every Day Smoker -- 0.50 packs/day    Types: Cigarettes  . Smokeless tobacco: Never Used     Comment: 1/2 pk per day  . Alcohol Use: No  . Drug Use: No  . Sexual Activity: Not on file   Other Topics Concern  . Not on file   Social History Narrative   Patient is married with one child.   Patient is right handed.   Patient has 2 yrs of education.   Patient does not drink  caffeine.     PHYSICAL EXAM  Filed Vitals:   04/21/15 1257  BP: 127/79  Pulse: 75  Height: _0  (1.575 m)  Weight: 147 lb 9.6 oz (66.951 kg)   Body mass index is 26.99 kg/(m^2). General: The patient is alert and cooperative at the time of the examination. Skin: No significant peripheral edema is noted.   Neurologic Exam Mental status: The patient is oriented x 3.MMSE 29/30. AFT 12. Clock drawing 4/4. Cranial nerves: Facial symmetry is present. Speech is normal, no aphasia or dysarthria is noted. Extraocular movements are full. Visual fields are full. Motor: The patient has good strength in all 4 extremities. No focal weakness Sensory examination: Soft touch sensation is  symmetric on the face, arms, and legs. Coordination: The patient has good finger-nose-finger and heel-to-shin bilaterally. Gait and station: The patient has a normal gait. Tandem gait is steady . Romberg is negative. No drift is seen. Reflexes: Deep tendon reflexes are symmetric. DIAGNOSTIC DATA (LABS, IMAGING, TESTING) - I reviewed patient records, labs, notes, testing and imaging myself where available.  Lab Results  Component Value Date   WBC 8.2 09/23/2014   HGB 16.5* 03/18/2014   HCT 46.5 09/23/2014   MCV 88.0 03/18/2014   PLT 184 03/18/2014      Component Value Date/Time   NA 142 09/23/2014 1620   NA 141 03/18/2014 1410   K 4.0 09/23/2014 1620   CL 103 09/23/2014 1620   CO2 23 09/23/2014 1620   GLUCOSE 90 09/23/2014 1620   GLUCOSE 86 03/18/2014 1410   BUN 10 09/23/2014 1620   BUN 9 03/18/2014 1410   CREATININE 0.77 09/23/2014 1620   CALCIUM 8.9 09/23/2014 1620   PROT 6.1 09/23/2014 1620   PROT 7.1 03/18/2014 1410   ALBUMIN 4.1 09/23/2014 1620   ALBUMIN 4.0 03/18/2014 1410   AST 15 09/23/2014 1620   ALT 11 09/23/2014 1620   ALKPHOS 134* 09/23/2014 1620   BILITOT <0.2 09/23/2014 1620   BILITOT 0.3 03/18/2014 1410   GFRNONAA 88 09/23/2014 1620   GFRAA 101 09/23/2014 1620        ASSESSMENT AND PLAN 55 y.o. year old female has a past medical history of seizure disorder currently well-controlled on Keppra and Dilantin. Mild memory loss which has improved with medication change. No further amnesia events  Continue Keppra at current dose Continue Dilantin at current dose will refill Last Dilantin level 10..6 and Keppra 12.4 on  09/23/2014 call for seizure activity Follow-up in 6 months Dennie Bible, [facility], [facility], [facility] [address], [place], [place] [address] [phone]

## 2015-04-22 ENCOUNTER — Telehealth: Payer: Self-pay

## 2015-04-22 NOTE — Telephone Encounter (Signed)
Prior auth for brand name Toprol XL 100mg  sent to [facility].

## 2015-04-24 ENCOUNTER — Telehealth: Payer: Self-pay

## 2015-04-24 NOTE — Telephone Encounter (Signed)
Brand Name Toprol denied by [employer] [facility]. She needs to try and fail preferred drugs. Will forward to Dr. Claiborne Billings.

## 2015-04-25 ENCOUNTER — Telehealth: Payer: Self-pay

## 2015-04-25 NOTE — Telephone Encounter (Signed)
[employer] [facility]) has approved the request for coverage on Keppra effective until 04/17/2016 Ref # [id number]

## 2015-05-20 ENCOUNTER — Other Ambulatory Visit: Payer: Self-pay | Admitting: Nurse Practitioner

## 2015-06-22 ENCOUNTER — Telehealth: Payer: Self-pay | Admitting: Cardiovascular Disease

## 2015-06-22 ENCOUNTER — Encounter: Payer: Self-pay | Admitting: Cardiovascular Disease

## 2015-06-22 ENCOUNTER — Ambulatory Visit (INDEPENDENT_AMBULATORY_CARE_PROVIDER_SITE_OTHER): Payer: Medicare Other | Admitting: Cardiovascular Disease

## 2015-06-22 VITALS — BP 120/90 | HR 77 | Ht 62.0 in | Wt 145.0 lb

## 2015-06-22 DIAGNOSIS — R002 Palpitations: Secondary | ICD-10-CM | POA: Diagnosis not present

## 2015-06-22 DIAGNOSIS — I10 Essential (primary) hypertension: Secondary | ICD-10-CM | POA: Diagnosis not present

## 2015-06-22 DIAGNOSIS — R9431 Abnormal electrocardiogram [ECG] [EKG]: Secondary | ICD-10-CM

## 2015-06-22 DIAGNOSIS — E785 Hyperlipidemia, unspecified: Secondary | ICD-10-CM

## 2015-06-22 DIAGNOSIS — Z720 Tobacco use: Secondary | ICD-10-CM

## 2015-06-22 MED ORDER — METOPROLOL TARTRATE 50 MG PO TABS: 75.0000 mg | ORAL_TABLET | Freq: Two times a day (BID) | ORAL | Status: DC

## 2015-06-22 NOTE — Telephone Encounter (Signed)
Dosing verified.

## 2015-06-22 NOTE — Patient Instructions (Addendum)
Your physician has recommended you make the following change in your medication:   1.) the metoprolol SUCC has been changed to metoprolol tartrate.  Your physician recommends that you return for lab work.  Your physician recommends that you schedule a follow-up appointment in: 3 months with Dr Claiborne Billings

## 2015-06-22 NOTE — Telephone Encounter (Signed)
Bailey Mech is calling to verifiy the pt's Metoprolol prescription.

## 2015-06-22 NOTE — Progress Notes (Signed)
Patient ID: Wendy Lopez, female   DOB: [birth year], 55 y.o.   MRN: [id number]     HPI: Wendy Lopez is a 55 y.o. female who presents for a 5 month cardiology follow-up evaluation.   Wendy Lopez has a long-standing history of tobacco abuse and started smoking at age 60.  In 2009 cardiac catheterization revealed normal coronary arteries. In February 2012 a nuclear perfusion study was done after she experienced episodes of chest pain and shortness of breath and this revealed normal perfusion. An echo Doppler study demonstrated mild pulmonary hypertension with estimated RV systolic pressure of 36 mm. Because of issues of recurrent shortness of breath she underwent a cardiopulmonary met test and was found to have a blunted chronotropic response to exercise which is making it difficult for her to improve her endurance and exercise capacity. She has a history of tachypalpitations. She has a history of invasive ductal carcinoma and is status post right mastectomy with sentinel node biopsy and is status post chemotherapy. She has a history of vitamin D insufficiency mild blood pressure elevation. There is also a history of depression.  On 10/16/2012 , a 2-D echo Doppler study  revealed an ejection fraction in the range of 60-65%. She did not have wall motion abnormalities and had normal diastolic function. There was evidence for right ventricular hypertrophy with normal RV function. She again had mild pulmonary hypertension with an estimated pressure 39 mm. There is moderate tricuspid regurgitation. Wendy Lopez continues to smoke cigarettes. She does note some indigestion. He also has noticed some left leg weakness and has issues with L4-L5 disc disease. She previously was on a higher dose of present Brintellex but due to tremors she has reduced his dose to 5 mg daily.  A subsequent nuclear perfusion study was done on 11/20/2012 after her ECG revealed slight additional T-wave abnormalities and was  essentially normal without wall motion abnormality, scar or ischemia.  She has a history of cervical disc disease and had noted some upper back discomfort in the past.  In December 2015, her EKG had shown  more pronounced downsloping ST segment depression in the inferior to inferolateral leads.  I recommended a subsequent nuclear perfusion study.  This was done on 04/17/2014 , which was not significant change from her previous study.  She again had basal inferolateral ST-T changes with T-wave inversion in V3 through V4 which did not significant change with stress.  She had normal perfusion with post-rest ejection fraction at 65%.  She has continued to smoke cigarettes currently one half pack per day.  Has had some issues with medications and now is on Keppra in place of Lamictal for seizures due to cost. Recently, she states that she has had some issue with metoprolol succinate.  She had self reduce this to 100 mg per day.  He has noticed some short bursts of palpitations.  She is wanting to switch metoprolol if possible.  She missed to being fatigued and weak.  He denies chest pressure.  She is not very active.  Past Medical History  Diagnosis Date  . Spinal fusion failure ([place])     3  . Mitral valve prolapse 06/01/10    echo- EF>55% mild tricupsid regurgitation. There is mild Pulmonary Hypertension.. Right  ventricular systolic pressure iselevated at 30-40 mmHg. LV systolic function is normal.  . Arthritis   . Breast cancer (Wendy Lopez) 02/13/2009  . Dysrhythmia     palpitations  . H/O hiatal hernia   . Epilepsy (Wendy Lopez)  last seizure 1990's  . PONV (postoperative nausea and vomiting)   . Osteopenia   . Depression   . Cancer of right breast (Wendy Lopez) 04/16/2012  . Depression 2014  . SOB (shortness of breath) 10/18/11    met-test  . Memory loss   . Anxiety and depression   . Localization-related (focal) (partial) epilepsy and epileptic syndromes with complex partial seizures, without mention of  intractable epilepsy 02/07/2013  . Memory deficit 02/07/2013    Past Surgical History  Procedure Laterality Date  . Tonsillectomy    . Spine surgery  1999, 2003, 2005  . Hand tendon surgery  2012    tendons released in right hand   . Mastectomy, radical  02/13/2009  . Back surgery      screws- lumbar  . Breast surgery  2010    mastectomy - RIGHT  . Colonoscopy with propofol  03/13/2012    Procedure: COLONOSCOPY WITH PROPOFOL;  Surgeon: Garlan Fair, MD;  Location: [facility];  Service: Endoscopy;  Laterality: N/A;  . Esophagogastroduodenoscopy  03/13/2012    Procedure: ESOPHAGOGASTRODUODENOSCOPY (EGD);  Surgeon: Garlan Fair, MD;  Location: [facility];  Service: Endoscopy;  Laterality: N/A;  Reflux, Hiatal Hernia    Allergies  Allergen Reactions  . Carbamazepine Anaphylaxis  . Iodine Anaphylaxis and Rash  . Lamictal [Lamotrigine] Anaphylaxis    SOB, Lamotrigine only  . Other     STATES HAS SHORTNESS OF BREATH WITH GENERIC DRUGS     ALSO RASH AND ITCHING  . Pseudoephedrine Hcl Er Other (See Comments)    SEIZURES.  . Valium Other (See Comments)    Hallucinations, spasticity, hyper-relfexive   . Amlodipine Rash    Face swell  . Oxycodone Nausea And Vomiting and Rash  . Prednisone Rash and Other (See Comments)    Prescribing MD told patient he thinks dose (dose pack) was "just too high." 03/09/11   STATES HAS TAKEN SMALLER DOSE AND STILL HAD RASH AND JITTERINESS  . Ibuprofen Swelling  . Percocet [Oxycodone-Acetaminophen] Nausea And Vomiting  . Latex Dermatitis and Rash    Current Outpatient Prescriptions  Medication Sig Dispense Refill  . aspirin-acetaminophen-caffeine (EXCEDRIN EXTRA STRENGTH) 250-250-65 MG per tablet Take 1 tablet by mouth every 6 (six) hours as needed. Pain    . cetirizine (ZYRTEC) 10 MG tablet Take 10 mg by mouth daily.    . cyanocobalamin 100 MCG tablet Take 100 mcg by mouth daily.    Marland Kitchen HYDROcodone-acetaminophen (NORCO/VICODIN) 5-325 MG  per tablet Take 1 tablet by mouth every 6 (six) hours as needed for pain. Reported on 04/07/2015    . KEPPRA 500 MG tablet Take 1 tablet (500 mg total) by mouth 2 (two) times daily. 60 tablet 6  . phenytoin (DILANTIN) 100 MG ER capsule Take 1 capsule (100 mg total) by mouth 3 (three) times daily. BRAND 90 capsule 6  . metoprolol (LOPRESSOR) 50 MG tablet Take 1.5 tablets (75 mg total) by mouth 2 (two) times daily. 90 tablet 6   No current facility-administered medications for this visit.    Social History   Social History  . Marital Status: Married    Spouse Name: N/A  . Number of Children: 1  . Years of Education: college 2   Occupational History  . unemployed     2003   Social History Main Topics  . Smoking status: Current Every Day Smoker -- 0.50 packs/day    Types: Cigarettes  . Smokeless tobacco: Never Used     Comment:  1/2 pk per day  . Alcohol Use: No  . Drug Use: No  . Sexual Activity: Not on file   Other Topics Concern  . Not on file   Social History Narrative   Patient is married with one child.   Patient is right handed.   Patient has 2 yrs of education.   Patient does not drink caffeine.    Social she is married and has one child. She's been smoking since age 25.  ROS General: Negative; No fevers, chills, or night sweats;  HEENT: Negative; No changes in vision or hearing, sinus congestion, difficulty swallowing Pulmonary: Negative; No cough, wheezing, shortness of breath, hemoptysis Cardiovascular: Negative; No chest pain, presyncope, syncope, palpitations GI: Negative; No nausea, vomiting, diarrhea, or abdominal pain GU: Negative; No dysuria, hematuria, or difficulty voiding Musculoskeletal: Negative; no myalgias, joint pain, or weakness Hematologic/Oncology: Remote history of stage I breast CA of the right breast with recent evaluation felt to be stable. Endocrine: Negative; no heat/cold intolerance; no diabetes Neuro: Remote history of seizure disorder  for which she had been on chronic Lamictal and Dilantin and is followed now by Dr. Jannifer Franklin; previously she had been followed by Dr. Erling Cruz.  She was switched to [place] due to cost since she could not take generic Lamictal. Skin: Negative; No rashes or skin lesions Psychiatric: Negative; No behavioral problems, depression Sleep: Negative; No snoring, daytime sleepiness, hypersomnolence, bruxism, restless legs, hypnogognic hallucinations, no cataplexy Other comprehensive 14 point system review is negative.   PE BP 120/90 mmHg  Pulse 77  Ht 5' 2"  (1.575 m)  Wt 145 lb (65.772 kg)  BMI 26.51 kg/m2  LMP 11/03/1993  Repeat blood pressure by me was 128/82  Wt Readings from Last 3 Encounters:  06/22/15 145 lb (65.772 kg)  04/21/15 147 lb 9.6 oz (66.951 kg)  04/07/15 146 lb 1.6 oz (66.271 kg)   General: Alert, oriented, no distress.  Skin: normal turgor, no rashes HEENT: Normocephalic, atraumatic. Pupils round and reactive; sclera anicteric;no lid lag.  Nose without nasal septal hypertrophy Mouth/Parynx benign; Mallinpatti scale 2 Neck: No JVD, no carotid bruits with normal carotid upstroke Chest, status post right mastectomy Lungs: decreased BS; no wheezing or rales Heart: RRR, s1 s2 normal 1/6 sem at apex; no ectopy; no diastolic murmur.  No rubs thrills or heaves Abdomen: soft, nontender; no hepatosplenomehaly, BS+; abdominal aorta nontender and not dilated by palpation. Pulses 2+ Extremities: no clubbing cyanosis or edema, Homan's sign negative  Neurologic: grossly nonfocal Psychological: Normal affect and mood  ECG (independently read by me):  Normal sinus rhythm at 77 bpm.  Biatrial enlargement.  Previously noted inferolateral T wave abnormalities.  November 2016 ECG (independently read by me): Normal sinus rhythm at 75 bpm.  Inferolateral ST-T wave abnormalities.  03/31/2014 ECG (independently read by me) : Normal sinus rhythm at 75 bpm.  There is now significantly more pronounced  downsloping ST segment depression in leads II, III, and F V4 through V6 and T-wave inversion in V3 compared to her prior ECG of over one year ago.  Prior ECG: Normal sinus rhythm at 68. T-wave abnormalities  inferiorly in V3 through V6  LABS:  BMET  BMP Latest Ref Rng 09/23/2014 03/18/2014 07/23/2013  Glucose 65 - 99 mg/dL 90 86 91  BUN 6 - 24 mg/dL 10 9 9   Creatinine 0.57 - 1.00 mg/dL 0.77 0.77 0.83  BUN/Creat Ratio 9 - 23 13 - -  Sodium 134 - 144 mmol/L 142 141 143  Potassium 3.5 -  5.2 mmol/L 4.0 4.2 4.3  Chloride 97 - 108 mmol/L 103 102 104  CO2 18 - 29 mmol/L 23 26 28   Calcium 8.7 - 10.2 mg/dL 8.9 9.1 9.2      Component Value Date/Time   PROT 6.1 09/23/2014 1620   PROT 7.1 03/18/2014 1410   ALBUMIN 4.1 09/23/2014 1620   ALBUMIN 4.0 03/18/2014 1410   AST 15 09/23/2014 1620   ALT 11 09/23/2014 1620   ALKPHOS 134* 09/23/2014 1620   BILITOT <0.2 09/23/2014 1620   BILITOT 0.3 03/18/2014 1410    CBC Latest Ref Rng 09/23/2014 03/18/2014 07/23/2013  WBC 3.4 - 10.8 x10E3/uL 8.2 8.0 6.4  Hemoglobin 12.0 - 15.0 g/dL - 16.5(H) 15.4(H)  Hematocrit 34.0 - 46.6 % 46.5 46.8(H) 44.7  Platelets 150 - 379 x10E3/uL 181 184 170   Lab Results  Component Value Date   MCV 88 09/23/2014   MCV 88.0 03/18/2014   MCV 88.5 07/23/2013   Lab Results  Component Value Date   TSH 0.676 08/26/2011  No results found for: HGBA1C   Lipid Panel  No results found for: CHOL, TRIG, HDL, CHOLHDL, VLDL, LDLCALC, LDLDIRECT  RADIOLOGY: No results found.    ASSESSMENT AND PLAN: Ms. Dariyah Garduno is a 55 year old female with a 40 year tobacco history who continues to smoke one half pack per day despite numerous admonishments and counseled. In January 2009 prior cardiac catheterization at the [facility] revealed normal coronary arteries. A nuclear perfusion study in 2012 showed normal perfusion. A cardiopulmonary met test in July 2013 showed blunted chronotropic response without ischemic changes  but she did have reduced functional status with peak O2 at 69% of predicted. She has mild pulmonary hypertension.  An echo revealed normal function without wall motion abnormalities despite her ECG changes.  Her last stress test which was done because of more abnormal ST-T changes.  She had noticed increasing palpitations particularly when she started Keppra in place of Lamictal for seizure disorder.  As result, her dose of Toprol was increased and she had been taking this 100 mg twice a day.  Recently, there has been issues with this drug, particularly with reference to the generic versus the trade name. She recently had taken small amount of caffeine and also noticed another run of palpitations. She denies any episodes of chest pressure. She had a repeat echo Doppler study in November 2016, which essentially was unchanged and showed an EF of 60-65%.  There was trivial TR and MR.  Pulmonary pressures were normal.  I am recommending fasting laboratory be obtained.  I will initially change her from her current dose of Toprol which she has only been taking 100 mg to the shorter acting metoprolol tartrate 75 mg twice a day and see if she tolerates this better.  A complete set of blood work will be obtained in the fasting state.  I again provided counsel for smoking cessation. I have recommended exercise and walking at least 5 days per week. I will see her in 3 months for reevaluation.   Time spent: 25 minutes  Troy Sine, MD, [facility]  06/22/2015 6:57 PM

## 2015-06-30 DIAGNOSIS — M4302 Spondylolysis, cervical region: Secondary | ICD-10-CM | POA: Diagnosis not present

## 2015-06-30 DIAGNOSIS — M47816 Spondylosis without myelopathy or radiculopathy, lumbar region: Secondary | ICD-10-CM | POA: Diagnosis not present

## 2015-08-21 ENCOUNTER — Other Ambulatory Visit: Payer: Self-pay | Admitting: Cardiovascular Disease

## 2015-08-21 NOTE — Telephone Encounter (Signed)
Rx Refill

## 2015-09-18 ENCOUNTER — Encounter: Payer: Self-pay | Admitting: Cardiovascular Disease

## 2015-09-18 ENCOUNTER — Ambulatory Visit (INDEPENDENT_AMBULATORY_CARE_PROVIDER_SITE_OTHER): Payer: Medicare Other | Admitting: Cardiovascular Disease

## 2015-09-18 VITALS — BP 100/72 | HR 71 | Ht 62.0 in | Wt 138.6 lb

## 2015-09-18 DIAGNOSIS — Z720 Tobacco use: Secondary | ICD-10-CM

## 2015-09-18 DIAGNOSIS — G40909 Epilepsy, unspecified, not intractable, without status epilepticus: Secondary | ICD-10-CM | POA: Diagnosis not present

## 2015-09-18 DIAGNOSIS — R002 Palpitations: Secondary | ICD-10-CM

## 2015-09-18 DIAGNOSIS — I10 Essential (primary) hypertension: Secondary | ICD-10-CM

## 2015-09-18 MED ORDER — METOPROLOL SUCCINATE ER 100 MG PO TB24: ORAL_TABLET | ORAL | Status: DC

## 2015-09-18 NOTE — Patient Instructions (Signed)
Your physician recommends that you continue on your current medications as directed. Please refer to the Current Medication list given to you today.  Dr Claiborne Billings recommends that you schedule a follow-up appointment in 1 year. You will receive a reminder letter in the mail two months in advance. If you don't receive a letter, please call our office to schedule the follow-up appointment.  If you need a refill on your cardiac medications before your next appointment, please call your pharmacy.

## 2015-09-20 ENCOUNTER — Encounter: Payer: Self-pay | Admitting: Cardiovascular Disease

## 2015-09-20 DIAGNOSIS — G40909 Epilepsy, unspecified, not intractable, without status epilepticus: Secondary | ICD-10-CM | POA: Insufficient documentation

## 2015-09-20 NOTE — Progress Notes (Signed)
Patient ID: Wendy Lopez, female   DOB: Jun 25, 1960, 55 y.o.   MRN: [id number]     HPI: Wendy Lopez is a 56 y.o. female who presents for a 2 month cardiology follow-up evaluation.   Wendy Lopez has a long-standing history of tobacco abuse and started smoking at age 62.  In 2009 cardiac catheterization revealed normal coronary arteries. In February 2012 a nuclear perfusion study was done after Wendy Lopez experienced episodes of chest pain and shortness of breath and this revealed normal perfusion. An echo Doppler study demonstrated mild pulmonary hypertension with estimated RV systolic pressure of 36 mm. Because of issues of recurrent shortness of breath Wendy Lopez underwent a cardiopulmonary met test and was found to have a blunted chronotropic response to exercise which is making it difficult for Wendy Lopez to improve Wendy Lopez endurance and exercise capacity. Wendy Lopez has a history of tachypalpitations. Wendy Lopez has a history of invasive ductal carcinoma and is status post right mastectomy with sentinel node biopsy and is status post chemotherapy. Wendy Lopez has a history of vitamin D insufficiency mild blood pressure elevation. There is also a history of depression.  On 10/16/2012 , a 2-D echo Doppler study  revealed an ejection fraction in the range of 60-65%. Wendy Lopez did not have wall motion abnormalities and had normal diastolic function. There was evidence for right ventricular hypertrophy with normal RV function. Wendy Lopez again had mild pulmonary hypertension with an estimated pressure 39 mm. There is moderate tricuspid regurgitation. Wendy Lopez continues to smoke cigarettes. Wendy Lopez does note some indigestion. He also has noticed some left leg weakness and has issues with L4-L5 disc disease. Wendy Lopez previously was on a higher dose of present Brintellex but due to tremors Wendy Lopez has reduced his dose to 5 mg daily.  A subsequent nuclear perfusion study was done on 11/20/2012 after Wendy Lopez ECG revealed slight additional T-wave abnormalities and was  essentially normal without wall motion abnormality, scar or ischemia.  Wendy Lopez has a history of cervical disc disease and had noted some upper back discomfort in the past.  In December 2015, Wendy Lopez EKG had shown  more pronounced downsloping ST segment depression in the inferior to inferolateral leads.  I recommended a subsequent nuclear perfusion study.  This was done on 04/17/2014 , which was not significant change from Wendy Lopez previous study.  Wendy Lopez again had basal inferolateral ST-T changes with T-wave inversion in V3 through V4 which did not significant change with stress.  Wendy Lopez had normal perfusion with post-rest ejection fraction at 65%.  Wendy Lopez has continued to smoke cigarettes currently one half pack per day.  Has had some issues with medications and now is on Keppra in place of Lamictal for seizures due to cost. Recently, Wendy Lopez  had some issue with metoprolol succinate.  Wendy Lopez had self reduced this to 100 mg per day.  When I saw Wendy Lopez several months ago.  Wendy Lopez was wanting to switch to a shorter acting version.  As result, I changed Wendy Lopez from Wendy Lopez current dose of Toprol to metoprolol, tartrate 75 mg twice a day to see if Wendy Lopez will tolerate this better.  Apparently Wendy Lopez did not tolerate the tartrate preparation and admitted to significant increased palpitations.  As result, Wendy Lopez put herself back on long-acting Toprol and is now taking 150 mg daily.  Wendy Lopez has felt improved with this.  Wendy Lopez tells me Wendy Lopez will be participating in water aerobics.  Wendy Lopez has now completely stopped all caffeine use.  Wendy Lopez also has reduced Wendy Lopez tobacco habit.  Wendy Lopez presents for reevaluation.  Past Medical History  Diagnosis Date  . 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 Mitral valve prolapse 06/01/10    echo- EF>55% mild tricupsid regurgitation. There is mild Pulmonary Hypertension.. Right  ventricular systolic pressure iselevated at 30-40 mmHg. LV systolic function is normal.  . Arthritis   . Breast cancer ([place]) 02/13/2009  . Dysrhythmia      palpitations  . H/O hiatal hernia   . Epilepsy ([place])     last seizure 1990's  . PONV (postoperative nausea and vomiting)   . Osteopenia   . Depression   . Cancer of right breast ([place]) 04/16/2012  . Depression 2014  . SOB (shortness of breath) 10/18/11    met-test  . Memory loss   . Anxiety and depression   . Localization-related (focal) (partial) epilepsy and epileptic syndromes with complex partial seizures, without mention of intractable epilepsy 02/07/2013  . Memory deficit 02/07/2013    Past Surgical History  Procedure Laterality Date  . Tonsillectomy    . Spine surgery  1999, 2003, 2005  . Hand tendon surgery  2012    tendons released in right hand   . Mastectomy, radical  02/13/2009  . Back surgery      screws- lumbar  . Breast surgery  2010    mastectomy - RIGHT  . Colonoscopy with propofol  03/13/2012    Procedure: COLONOSCOPY WITH PROPOFOL;  Surgeon: Garlan Fair, MD;  Location: [facility];  Service: Endoscopy;  Laterality: N/A;  . Esophagogastroduodenoscopy  03/13/2012    Procedure: ESOPHAGOGASTRODUODENOSCOPY (EGD);  Surgeon: Garlan Fair, MD;  Location: [facility];  Service: Endoscopy;  Laterality: N/A;  Reflux, Hiatal Hernia    Allergies  Allergen Reactions  . Carbamazepine Anaphylaxis  . Iodine Anaphylaxis and Rash  . Lamictal [Lamotrigine] Anaphylaxis    SOB, Lamotrigine only  . Other     STATES HAS SHORTNESS OF BREATH WITH GENERIC DRUGS     ALSO RASH AND ITCHING  . Pseudoephedrine Hcl Er Other (See Comments)    SEIZURES.  . Valium Other (See Comments)    Hallucinations, spasticity, hyper-relfexive   . Amlodipine Rash    Face swell  . Oxycodone Nausea And Vomiting and Rash  . Prednisone Rash and Other (See Comments)    Prescribing MD told patient he thinks dose (dose pack) was "just too high." 03/09/11   STATES HAS TAKEN SMALLER DOSE AND STILL HAD RASH AND JITTERINESS  . Ibuprofen Swelling  . Percocet [Oxycodone-Acetaminophen] Nausea And  Vomiting  . Latex Dermatitis and Rash    Current Outpatient Prescriptions  Medication Sig Dispense Refill  . aspirin-acetaminophen-caffeine (EXCEDRIN EXTRA STRENGTH) 250-250-65 MG per tablet Take 1 tablet by mouth every 6 (six) hours as needed. Pain    . cetirizine (ZYRTEC) 10 MG tablet Take 10 mg by mouth daily.    . cyanocobalamin 100 MCG tablet Take 100 mcg by mouth daily.    Marland Kitchen HYDROcodone-acetaminophen (NORCO/VICODIN) 5-325 MG per tablet Take 1 tablet by mouth every 6 (six) hours as needed for pain. Reported on 04/07/2015    . KEPPRA 500 MG tablet Take 1 tablet (500 mg total) by mouth 2 (two) times daily. 60 tablet 6  . phenytoin (DILANTIN) 100 MG ER capsule Take 1 capsule (100 mg total) by mouth 3 (three) times daily. BRAND 90 capsule 6  . metoprolol succinate (TOPROL XL) 100 MG 24 hr tablet Take 1.5 tablets (150 mg total) by mouth once daily. Take with or immediately following a  meal. 45 tablet 11   No current facility-administered medications for this visit.    Social History   Social History  . Marital Status: Married    Spouse Name: N/A  . Number of Children: 1  . Years of Education: college 2   Occupational History  . unemployed     2003   Social History Main Topics  . Smoking status: Current Every Day Smoker -- 0.50 packs/day    Types: Cigarettes  . Smokeless tobacco: Never Used     Comment: 1/2 pk per day  . Alcohol Use: No  . Drug Use: No  . Sexual Activity: Not on file   Other Topics Concern  . Not on file   Social History Narrative   Patient is married with one child.   Patient is right handed.   Patient has 2 yrs of education.   Patient does not drink caffeine.    Social Wendy Lopez is married and has one child. Wendy Lopez has been smoking since age 84.  ROS General: Negative; No fevers, chills, or night sweats;  HEENT: Negative; No changes in vision or hearing, sinus congestion, difficulty swallowing Pulmonary: Negative; No cough, wheezing, shortness of breath,  hemoptysis Cardiovascular: Negative; No chest pain, presyncope, syncope, palpitations GI: Negative; No nausea, vomiting, diarrhea, or abdominal pain GU: Negative; No dysuria, hematuria, or difficulty voiding Musculoskeletal: Negative; no myalgias, joint pain, or weakness Hematologic/Oncology: Remote history of stage I breast CA of the right breast with recent evaluation felt to be stable. Endocrine: Negative; no heat/cold intolerance; no diabetes Neuro: Remote history of seizure disorder for which Wendy Lopez had been on chronic Lamictal and Dilantin and is followed now by Dr. Jannifer Franklin; previously Wendy Lopez had been followed by Dr. Erling Cruz.  Wendy Lopez was switched to [place] due to cost since Wendy Lopez could not take generic Lamictal. Skin: Negative; No rashes or skin lesions Psychiatric: Negative; No behavioral problems, depression Sleep: Negative; No snoring, daytime sleepiness, hypersomnolence, bruxism, restless legs, hypnogognic hallucinations, no cataplexy Other comprehensive 14 point system review is negative.   PE BP 100/72 mmHg  Pulse 71  Ht 5' 2"  (1.575 m)  Wt 138 lb 9.6 oz (62.869 kg)  BMI 25.34 kg/m2  SpO2 98%  LMP 11/03/1993  Repeat blood pressure by me was 140/84 in the right arm supine and 132/80 in the left arm supine. In the standing position.  Wendy Lopez right arm blood pressure was 142/82 in Wendy Lopez left arm blood pressure was 128/78.  Wt Readings from Last 3 Encounters:  09/18/15 138 lb 9.6 oz (62.869 kg)  06/22/15 145 lb (65.772 kg)  04/21/15 147 lb 9.6 oz (66.951 kg)   General: Alert, oriented, no distress.  Skin: normal turgor, no rashes HEENT: Normocephalic, atraumatic. Pupils round and reactive; sclera anicteric;no lid lag.  Nose without nasal septal hypertrophy Mouth/Parynx benign; Mallinpatti scale 2 Neck: No JVD, no carotid bruits with normal carotid upstroke Chest, status post right mastectomy Lungs: decreased BS; no wheezing or rales Heart: RRR, s1 s2 normal 1/6 sem at apex; no ectopy; no  diastolic murmur.  No rubs thrills or heaves Abdomen: soft, nontender; no hepatosplenomehaly, BS+; abdominal aorta nontender and not dilated by palpation. Pulses 2+ Extremities: no clubbing cyanosis or edema, Homan's sign negative  Neurologic: grossly nonfocal Psychological: Normal affect and mood  ECG (independently read by me): Normal sinus rhythm at 71 bpm.  Will biatrial enlargement.  Inferolateral ST-T wave abnormality.  Normal intervals.  March 2017 ECG (independently read by me):  Normal sinus rhythm at 77  bpm.  Biatrial enlargement.  Previously noted inferolateral T wave abnormalities.  November 2016 ECG (independently read by me): Normal sinus rhythm at 75 bpm.  Inferolateral ST-T wave abnormalities.  03/31/2014 ECG (independently read by me) : Normal sinus rhythm at 75 bpm.  There is now significantly more pronounced downsloping ST segment depression in leads II, III, and F V4 through V6 and T-wave inversion in V3 compared to Wendy Lopez prior ECG of over one year ago.  Prior ECG: Normal sinus rhythm at 68. T-wave abnormalities  inferiorly in V3 through V6  LABS:   BMP Latest Ref Rng 09/23/2014 03/18/2014 07/23/2013  Glucose 65 - 99 mg/dL 90 86 91  BUN 6 - 24 mg/dL 10 9 9   Creatinine 0.57 - 1.00 mg/dL 0.77 0.77 0.83  BUN/Creat Ratio 9 - 23 13 - -  Sodium 134 - 144 mmol/L 142 141 143  Potassium 3.5 - 5.2 mmol/L 4.0 4.2 4.3  Chloride 97 - 108 mmol/L 103 102 104  CO2 18 - 29 mmol/L 23 26 28   Calcium 8.7 - 10.2 mg/dL 8.9 9.1 9.2    Hepatic Function Latest Ref Rng 09/23/2014 03/18/2014 07/23/2013  Total Protein 6.0 - 8.5 g/dL 6.1 7.1 7.2  Albumin 3.5 - 5.5 g/dL 4.1 4.0 3.9  AST 0 - 40 IU/L 15 15 21   ALT 0 - 32 IU/L 11 11 12   Alk Phosphatase 39 - 117 IU/L 134(H) 133(H) 131(H)  Total Bilirubin 0.0 - 1.2 mg/dL <0.2 0.3 0.2(L)    CBC Latest Ref Rng 09/23/2014 03/18/2014 07/23/2013  WBC 3.4 - 10.8 x10E3/uL 8.2 8.0 6.4  Hemoglobin 12.0 - 15.0 g/dL - 16.5(H) 15.4(H)  Hematocrit 34.0 - 46.6 % 46.5  46.8(H) 44.7  Platelets 150 - 379 x10E3/uL 181 184 170    Lab Results  Component Value Date   MCV 88 09/23/2014   MCV 88.0 03/18/2014   MCV 88.5 07/23/2013    Lab Results  Component Value Date   TSH 0.676 08/26/2011     Lab Results  Component Value Date   MCV 88 09/23/2014   MCV 88.0 03/18/2014   MCV 88.5 07/23/2013   Lab Results  Component Value Date   TSH 0.676 08/26/2011  No results found for: HGBA1C   Lipid Panel  No results found for: CHOL, TRIG, HDL, CHOLHDL, VLDL, LDLCALC, LDLDIRECT  RADIOLOGY: No results found.    ASSESSMENT AND PLAN: Wendy Lopez is a 55 year old female with a 40 year tobacco history who continues to smoke one half pack per day.  In January 2009  cardiac catheterization at the [facility] revealed normal coronary arteries. A nuclear perfusion study in 2012 showed normal perfusion. A cardiopulmonary met test in July 2013 showed blunted chronotropic response without ischemic changes but Wendy Lopez had reduced functional status with peak O2 at 69% of predicted. Wendy Lopez has mild pulmonary hypertension.  An echo revealed normal function without wall motion abnormalities despite Wendy Lopez ECG changes.  Wendy Lopez last stress test which was done because of more abnormal ST-T changes. A repeat echo Doppler study in November 2016, which essentially was unchanged and showed an EF of 60-65%.  There was trivial TR and MR.  Pulmonary pressures were normal.  Wendy Lopez had experienced increased palpitations when Wendy Lopez switched from Lamictal to [place].  When last seen, Wendy Lopez wanted to switch to a shorter acting version of beta blocker.  Wendy Lopez did not tolerate this.  Wendy Lopez is now back on Toprol-XL 150 mg daily.  Wendy Lopez ECG today is stable, although  Wendy Lopez continues to have inferolateral ST segment changes which have been noted previously.  Wendy Lopez blood pressure in both arms are fairly similar and not statistically significantly abnormal.  Again discussed importance of complete smoking cessation.  I  discussed increased exercise.  Wendy Lopez weight is stable.  Wendy Lopez will refrain from caffeine.  I will see Wendy Lopez in one year for reevaluation.  Time spent: 25 minutes  Troy Sine, MD, [facility]  09/20/2015 6:32 PM

## 2015-09-22 DIAGNOSIS — M47816 Spondylosis without myelopathy or radiculopathy, lumbar region: Secondary | ICD-10-CM | POA: Diagnosis not present

## 2015-10-26 ENCOUNTER — Ambulatory Visit (INDEPENDENT_AMBULATORY_CARE_PROVIDER_SITE_OTHER): Payer: Medicare Other | Admitting: Nurse Practitioner

## 2015-10-26 ENCOUNTER — Encounter: Payer: Self-pay | Admitting: Nurse Practitioner

## 2015-10-26 VITALS — BP 100/76 | HR 72 | Ht 62.0 in | Wt 134.2 lb

## 2015-10-26 DIAGNOSIS — G40209 Localization-related (focal) (partial) symptomatic epilepsy and epileptic syndromes with complex partial seizures, not intractable, without status epilepticus: Secondary | ICD-10-CM

## 2015-10-26 DIAGNOSIS — R413 Other amnesia: Secondary | ICD-10-CM

## 2015-10-26 DIAGNOSIS — G40909 Epilepsy, unspecified, not intractable, without status epilepticus: Secondary | ICD-10-CM | POA: Diagnosis not present

## 2015-10-26 DIAGNOSIS — Z5181 Encounter for therapeutic drug level monitoring: Secondary | ICD-10-CM

## 2015-10-26 MED ORDER — PHENYTOIN SODIUM EXTENDED 100 MG PO CAPS: 100.0000 mg | ORAL_CAPSULE | Freq: Three times a day (TID) | ORAL | Status: DC

## 2015-10-26 MED ORDER — KEPPRA 500 MG PO TABS: 500.0000 mg | ORAL_TABLET | Freq: Two times a day (BID) | ORAL | Status: DC

## 2015-10-26 NOTE — Progress Notes (Signed)
I have read the note, and I agree with the clinical assessment and plan.  Deron Poole KEITH   

## 2015-10-26 NOTE — Patient Instructions (Signed)
Continue Keppra at current dose Continue Dilantin at current dose will refill CBC, CMP and Dilantin level call for seizure activity Follow-up yearly

## 2015-10-26 NOTE — Progress Notes (Signed)
[facility]  PATIENT: Wendy Lopez DOB: 1961-01-28   REASON FOR VISIT: Follow-up for seizure disorder, mild memory loss HISTORY FROM: Patient    HISTORY OF PRESENT ILLNESS:Wendy Lopez is a 55 year old right-handed white female with a history of seizures. She was last seen in this office 04/21/15. The patient has been on Lamictal and Dilantin. Her Dilantin dose was decreased to 300 mg at bedtime and since that time, she feels that she has done better with the vision. The patient no longer seen a purple haze. She indicates that her cognitive abilities have improved, and her gait stability has improved. The patient has not had any further amnestic events. She is now back to driving a motor vehicle. She  was on brand Lamictal but unable to afford the cost due to doughnut hole. She switched  to generic and had side effects, . of dizziness headache and tingling in the hands. She was then switched to [place]. She is also on brand Dilantin she claims she has had side effects to generics in the past. She returns for reevaluation and refills. Last Dilantin level 10..6 and Keppra 12.4 on 09/23/2014.    REVIEW OF SYSTEMS: Full 14 system review of systems performed and notable only for those listed, all others are neg:  Constitutional: neg  Cardiovascular: neg Ear/Nose/Throat: neg  Skin: neg Eyes: neg Respiratory: neg Gastroitestinal: neg  Hematology/Lymphatic: neg  Endocrine: neg Musculoskeletal: Joint pain, neck pain Allergy/Immunology: neg Neurological: Seizure disorder  Psychiatric: neg Sleep : neg   ALLERGIES: Allergies  Allergen Reactions  . Carbamazepine Anaphylaxis  . Iodine Anaphylaxis and Rash  . Lamictal [Lamotrigine] Anaphylaxis    SOB, Lamotrigine only  . Other     STATES HAS SHORTNESS OF BREATH WITH GENERIC DRUGS     ALSO RASH AND ITCHING  . Pseudoephedrine Hcl Er Other (See Comments)    SEIZURES.  . Valium Other (See Comments)   Hallucinations, spasticity, hyper-relfexive   . Amlodipine Rash    Face swell  . Oxycodone Nausea And Vomiting and Rash  . Prednisone Rash and Other (See Comments)    Prescribing MD told patient he thinks dose (dose pack) was "just too high." 03/09/11   STATES HAS TAKEN SMALLER DOSE AND STILL HAD RASH AND JITTERINESS  . Ibuprofen Swelling  . Percocet [Oxycodone-Acetaminophen] Nausea And Vomiting  . Latex Dermatitis and Rash    HOME MEDICATIONS: Outpatient Prescriptions Prior to Visit  Medication Sig Dispense Refill  . aspirin-acetaminophen-caffeine (EXCEDRIN EXTRA STRENGTH) 250-250-65 MG per tablet Take 1 tablet by mouth every 6 (six) hours as needed. Pain    . cetirizine (ZYRTEC) 10 MG tablet Take 10 mg by mouth daily.    . cyanocobalamin 100 MCG tablet Take 100 mcg by mouth daily.    Marland Kitchen HYDROcodone-acetaminophen (NORCO/VICODIN) 5-325 MG per tablet Take 1 tablet by mouth every 6 (six) hours as needed for pain. Reported on 04/07/2015    . KEPPRA 500 MG tablet Take 1 tablet (500 mg total) by mouth 2 (two) times daily. 60 tablet 6  . metoprolol succinate (TOPROL XL) 100 MG 24 hr tablet Take 1.5 tablets (150 mg total) by mouth once daily. Take with or immediately following a meal. 45 tablet 11  . phenytoin (DILANTIN) 100 MG ER capsule Take 1 capsule (100 mg total) by mouth 3 (three) times daily. BRAND 90 capsule 6   No facility-administered medications prior to visit.    PAST MEDICAL HISTORY: Past Medical History  Diagnosis Date  .  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 Mitral valve prolapse 06/01/10    echo- EF>55% mild tricupsid regurgitation. There is mild Pulmonary Hypertension.. Right  ventricular systolic pressure iselevated at 30-40 mmHg. LV systolic function is normal.  . Arthritis   . Breast cancer ([place]) 02/13/2009  . Dysrhythmia     palpitations  . H/O hiatal hernia   . Epilepsy ([place])     last seizure 1990's  . PONV (postoperative nausea and vomiting)   . Osteopenia   .  Depression   . Cancer of right breast ([place]) 04/16/2012  . Depression 2014  . SOB (shortness of breath) 10/18/11    met-test  . Memory loss   . Anxiety and depression   . Localization-related (focal) (partial) epilepsy and epileptic syndromes with complex partial seizures, without mention of intractable epilepsy 02/07/2013  . Memory deficit 02/07/2013    PAST SURGICAL HISTORY: Past Surgical History  Procedure Laterality Date  . Tonsillectomy    . Spine surgery  1999, 2003, 2005  . Hand tendon surgery  2012    tendons released in right hand   . Mastectomy, radical  02/13/2009  . Back surgery      screws- lumbar  . Breast surgery  2010    mastectomy - RIGHT  . Colonoscopy with propofol  03/13/2012    Procedure: COLONOSCOPY WITH PROPOFOL;  Surgeon: Garlan Fair, MD;  Location: [facility];  Service: Endoscopy;  Laterality: N/A;  . Esophagogastroduodenoscopy  03/13/2012    Procedure: ESOPHAGOGASTRODUODENOSCOPY (EGD);  Surgeon: Garlan Fair, MD;  Location: [facility];  Service: Endoscopy;  Laterality: N/A;  Reflux, Hiatal Hernia    FAMILY HISTORY: Family History  Problem Relation Age of Onset  . Arthritis Mother     rhuematoid  . Heart disease Mother     Atrial fib  . Heart disease Father   . Heart disease Sister     Atrial fib    SOCIAL HISTORY: Social History   Social History  . Marital Status: Married    Spouse Name: N/A  . Number of Children: 1  . Years of Education: college 2   Occupational History  . unemployed     2003   Social History Main Topics  . Smoking status: Current Every Day Smoker -- 0.50 packs/day    Types: Cigarettes  . Smokeless tobacco: Never Used     Comment: 1/2 pk per day  . Alcohol Use: No  . Drug Use: No  . Sexual Activity: Not on file   Other Topics Concern  . Not on file   Social History Narrative   Patient is married with one child.   Patient is right handed.   Patient has 2 yrs of education.   Patient does not  drink caffeine.     PHYSICAL EXAM  Filed Vitals:   10/26/15 1303  BP: 100/76  Pulse: 72  Height: 5' 2"  (1.575 m)  Weight: 134 lb 3.2 oz (60.873 kg)   Body mass index is 24.54 kg/(m^2). General: The patient is alert and cooperative at the time of the examination. Skin: No significant peripheral edema is noted.   Neurologic Exam Mental status: The patient is oriented x 3.MMSE 29/30. AFT 8. Clock drawing 4/4. Cranial nerves: Facial symmetry is present. Speech is normal, no aphasia or dysarthria is noted. Extraocular movements are full. Visual fields are full. Motor: The patient has good strength in all 4 extremities. No focal weakness Sensory examination: Soft touch sensation  is symmetric on the face, arms, and legs. Coordination: The patient has good finger-nose-finger and heel-to-shin bilaterally. Gait and station: The patient has a normal gait. Tandem gait is steady . Romberg is negative. No drift is seen. Reflexes: Deep tendon reflexes are symmetric.   DIAGNOSTIC DATA (LABS, IMAGING, TESTING)   ASSESSMENT AND PLAN 55 y.o. year old female has a past medical history of seizure disorder currently well-controlled on Keppra and Dilantin BRAND drug. Mild memory loss which has improved with medication change. No further amnesia events  Continue Keppra at current dose, will refill Continue Dilantin at current dose will refill CBC, CMP and Dilantin level today call for seizure activity Follow-up yearly Dennie Bible, [facility], [facility], [facility] [address] [place], [place] [address] [phone]

## 2015-10-27 LAB — CBC WITH DIFFERENTIAL/PLATELET
Basophils Absolute: 0.1 10*3/uL (ref 0.0–0.2)
Basos: 1 %
EOS (ABSOLUTE): 0.2 10*3/uL (ref 0.0–0.4)
Eos: 2 %
Hematocrit: 52 % — ABNORMAL HIGH (ref 34.0–46.6)
Hemoglobin: 18.2 g/dL — ABNORMAL HIGH (ref 11.1–15.9)
Immature Grans (Abs): 0 10*3/uL (ref 0.0–0.1)
Immature Granulocytes: 0 %
Lymphocytes Absolute: 2.7 10*3/uL (ref 0.7–3.1)
Lymphs: 33 %
MCH: 31.7 pg (ref 26.6–33.0)
MCHC: 35 g/dL (ref 31.5–35.7)
MCV: 91 fL (ref 79–97)
Monocytes Absolute: 0.9 10*3/uL (ref 0.1–0.9)
Monocytes: 11 %
Neutrophils Absolute: 4.3 10*3/uL (ref 1.4–7.0)
Neutrophils: 53 %
Platelets: 187 10*3/uL (ref 150–379)
RBC: 5.74 x10E6/uL — ABNORMAL HIGH (ref 3.77–5.28)
RDW: 13.8 % (ref 12.3–15.4)
WBC: 8.2 10*3/uL (ref 3.4–10.8)

## 2015-10-27 LAB — COMPREHENSIVE METABOLIC PANEL
ALT: 11 IU/L (ref 0–32)
AST: 13 IU/L (ref 0–40)
Albumin/Globulin Ratio: 1.9 (ref 1.2–2.2)
Albumin: 4.4 g/dL (ref 3.5–5.5)
Alkaline Phosphatase: 118 IU/L — ABNORMAL HIGH (ref 39–117)
BUN/Creatinine Ratio: 20 (ref 9–23)
BUN: 17 mg/dL (ref 6–24)
Bilirubin Total: 0.3 mg/dL (ref 0.0–1.2)
CO2: 24 mmol/L (ref 18–29)
Calcium: 9.6 mg/dL (ref 8.7–10.2)
Chloride: 99 mmol/L (ref 96–106)
Creatinine, Ser: 0.86 mg/dL (ref 0.57–1.00)
GFR calc Af Amer: 88 mL/min/{1.73_m2} (ref >59)
GFR calc non Af Amer: 76 mL/min/{1.73_m2} (ref >59)
Globulin, Total: 2.3 g/dL (ref 1.5–4.5)
Glucose: 93 mg/dL (ref 65–99)
Potassium: 5.2 mmol/L (ref 3.5–5.2)
Sodium: 142 mmol/L (ref 134–144)
Total Protein: 6.7 g/dL (ref 6.0–8.5)

## 2015-10-27 LAB — PHENYTOIN LEVEL, TOTAL: Phenytoin (Dilantin), Serum: 13.4 ug/mL (ref 10.0–20.0)

## 2016-01-01 DIAGNOSIS — Z23 Encounter for immunization: Secondary | ICD-10-CM | POA: Diagnosis not present

## 2016-01-19 DIAGNOSIS — M47816 Spondylosis without myelopathy or radiculopathy, lumbar region: Secondary | ICD-10-CM | POA: Diagnosis not present

## 2016-01-19 DIAGNOSIS — M4302 Spondylolysis, cervical region: Secondary | ICD-10-CM | POA: Diagnosis not present

## 2016-04-02 ENCOUNTER — Other Ambulatory Visit: Payer: Self-pay | Admitting: Cardiovascular Disease

## 2016-04-04 DIAGNOSIS — Z853 Personal history of malignant neoplasm of breast: Secondary | ICD-10-CM | POA: Diagnosis not present

## 2016-04-04 DIAGNOSIS — Z1231 Encounter for screening mammogram for malignant neoplasm of breast: Secondary | ICD-10-CM | POA: Diagnosis not present

## 2016-04-04 NOTE — Progress Notes (Signed)
Wendy Lopez, [place] [address]   CLINIC:  Medical Oncology   PCP:  Wenda Low, [place] [address] [place] [address]   CURRENT THERAPY: Surveillance per NCCN guidelines for history of Stage IA right breast cancer.    BRIEF ONCOLOGIC HISTORY:    Cancer of right breast ([facility])   01/13/2009 Initial Diagnosis    Needle core biopsy of 12 o'clock mass in right breast and axillary lymph node demonstrating invasive mammary carcinoma with negative lymph node.  ER 99%, PR 54%, Ki-67 15%, and Her2  negative.      02/13/2009 Definitive Surgery    Right simple mastectomy by Dr. Dalbert Batman demonstrating a 1.7 cm invasive ductal carcinoma, grade 1, no LVI, clear margins and 0/3 sentinel lymph nodes.       Oncotype testing    Intermediate-risk; was offered chemotherapy        Adjuvant Chemotherapy    Taxotere/Cytoxan x 3 cycles completed (unable to complete final cycle due to intolerance; patient weight was reportedly not dose-adjusted).        Anti-estrogen oral therapy    Started on Anastrozole; was only able to tolerate about 6 months of therapy before it was discontinued d/t contractures of her hands requiring surgical intervention.  No subsequent anti-estrogen therapy was given.        Procedure    Per patient, she had genetic testing in the past; results were negative per her report.        INTERVAL HISTORY: Wendy Lopez 55 y.o. female presents to cancer center for continued follow-up for her history of Stage IA right breast cancer.    She tells me she had her unilateral left breast mammogram at [facility] yesterday; she has not received those results yet.  She is anxious that no one has called her with the results.  She feels "a sensation" in her right axilla and wants me to take a look today; she cannot feel a mass or lump, "but it doesn't feel right."    She continues to smoke 1/2 ppd; she reports being very  interested in stopping smoking.  She primarilyy smokes cigarettes to help with her pain from her chronic back and arthritis pain.  She states, "I just want somebody to just lock me away for 3 weeks so I can't smoke and then I'll have to quit."    With regards to her chronic pain, she tells me she has recently been seeing a physical therapist and paying her privately to help with her neck pain, back pain, and mobility issues. The therapy has been extremely helpful for her.    She has h/o seizure disorder and is on Keppra. Her last visit with her neurologist was in 04/2015.  Denies any current or recent seizure activity.  She has had her flu shot for this year, as well as the pneumonia vaccine within the past couple of years.  Her last colonoscopy was 5-6 years ago and was reportedly normal per patient.  Her last pap smear was in 2015 with her gynecologist and was normal per patient. Her PCP is Dr. Lysle Rubens.    REVIEW OF SYSTEMS:  Review of Systems  Constitutional: Negative.   HENT:  Negative.   Eyes: Negative.   Respiratory: Negative.   Cardiovascular: Negative.   Gastrointestinal: Negative.   Endocrine: Negative.   Genitourinary: Negative.  Negative for vaginal bleeding.   Musculoskeletal: Positive for arthralgias, back pain, myalgias and neck pain.  Neurological: Negative.   Psychiatric/Behavioral: Negative.      A 14-point review of systems was performed and is negative, except as noted above.    PAST MEDICAL/SURGICAL HISTORY:  Past Medical History:  Diagnosis Date  . Anxiety and depression   . Arthritis   . Breast cancer ([facility]) 02/13/2009  . Cancer of right breast ([facility]) 04/16/2012  . Depression   . Depression 2014  . Dysrhythmia    palpitations  . Epilepsy ([facility])    last seizure 1990's  . H/O hiatal hernia   . Localization-related (focal) (partial) epilepsy and epileptic syndromes with complex partial seizures, without mention of intractable epilepsy 02/07/2013  . Memory  deficit 02/07/2013  . Memory loss   . Mitral valve prolapse 06/01/10   echo- EF>55% mild tricupsid regurgitation. There is mild Pulmonary Hypertension.. Right  ventricular systolic pressure iselevated at 30-40 mmHg. LV systolic function is normal.  . Osteopenia   . PONV (postoperative nausea and vomiting)   . SOB (shortness of breath) 10/18/11   met-test  . Spinal fusion failure ([facility])    3   Past Surgical History:  Procedure Laterality Date  . BACK SURGERY     screws- lumbar  . BREAST SURGERY  2010   mastectomy - RIGHT  . COLONOSCOPY WITH PROPOFOL  03/13/2012   Procedure: COLONOSCOPY WITH PROPOFOL;  Surgeon: Garlan Fair, MD;  Location: [facility];  Service: Endoscopy;  Laterality: N/A;  . ESOPHAGOGASTRODUODENOSCOPY  03/13/2012   Procedure: ESOPHAGOGASTRODUODENOSCOPY (EGD);  Surgeon: Garlan Fair, MD;  Location: [facility];  Service: Endoscopy;  Laterality: N/A;  Reflux, Hiatal Hernia  . HAND TENDON SURGERY  2012   tendons released in right hand   . MASTECTOMY, RADICAL  02/13/2009  . [place], 2003, 2005  . TONSILLECTOMY         ALLERGIES:  Allergies  Allergen Reactions  . Carbamazepine Anaphylaxis  . Iodine Anaphylaxis and Rash  . Lamictal [Lamotrigine] Anaphylaxis    SOB, Lamotrigine only  . Other     STATES HAS SHORTNESS OF BREATH WITH GENERIC DRUGS     ALSO RASH AND ITCHING  . Pseudoephedrine Hcl Er Other (See Comments)    SEIZURES.  . Valium Other (See Comments)    Hallucinations, spasticity, hyper-relfexive   . Amlodipine Rash    Face swell  . Oxycodone Nausea And Vomiting and Rash  . Prednisone Rash and Other (See Comments)    Prescribing MD told patient he thinks dose (dose pack) was "just too high." 03/09/11   STATES HAS TAKEN SMALLER DOSE AND STILL HAD RASH AND JITTERINESS  . Ibuprofen Swelling  . Percocet [Oxycodone-Acetaminophen] Nausea And Vomiting  . Latex Dermatitis and Rash    CURRENT MEDICATIONS:  Outpatient Encounter  Prescriptions as of 04/05/2016  Medication Sig  . aspirin-acetaminophen-caffeine (EXCEDRIN EXTRA STRENGTH) [phone] MG per tablet Take 1 tablet by mouth every 6 (six) hours as needed. Pain  . cetirizine (ZYRTEC) 10 MG tablet Take 10 mg by mouth daily.  . cyanocobalamin 100 MCG tablet Take 100 mcg by mouth daily.  Marland Kitchen HYDROcodone-acetaminophen (NORCO/VICODIN) 5-325 MG per tablet Take 1 tablet by mouth every 6 (six) hours as needed for pain. Reported on 04/07/2015  . KEPPRA 500 MG tablet Take 1 tablet (500 mg total) by mouth 2 (two) times daily.  . metoprolol succinate (TOPROL-XL) 100 MG 24 hr tablet TAKE 1 TABLET BY MOUTH TWICE DAILY  . phenytoin (DILANTIN) 100 MG ER capsule Take 1 capsule (100 mg total) by  mouth 3 (three) times daily. BRAND   No facility-administered encounter medications on file as of 04/05/2016.     SOCIAL HISTORY:  Ms. Chaloux is married and lives with her husband in [place], [place]. She has 1 son; her son recently adopted 3 children, so the patient now has 3 grandchildren that she is excited about.  She is currently on disability secondary to her back.  She currently smokes about 1/2-1 ppd, "based on my pain in my back/joints."  Denies alcohol or illicit drug use.     PHYSICAL EXAMINATION  Performance Status: 90    Vitals:   04/05/16 1405  BP: 128/71  Pulse: 73  Resp: 18  Temp: 98.8 F (37.1 C)   Filed Weights   04/05/16 1405  Weight: 134 lb 1.6 oz (60.8 kg)   General: Female in no acute distress. Unaccompanied today.   HEENT: Head is normocephalic.  Pupils equal and reactive to light. Conjunctivae clear without exudate.  Sclerae anicteric. Oral mucosa is pink and moist without lesions. Posterior oropharynx is mildly erythematous.  Lymph: No cervical, supraclavicular, or infraclavicular lymphadenopathy noted on palpation.   Cardiovascular: Normal rate and rhythm Respiratory: Clear to auscultation bilaterally. Chest expansion symmetric without accessory  muscle use. Breathing non-labored.    Breast Exam:  -Left breast: No palpable masses. No skin erythema or thickening. No nipple inversion or nipple discharge. Normal fibrocystic changes palpable, not concerning for malignancy.  -Right breast: s/p mastectomy; no palpable masses. Healed scar without nodularity. Very small area of palpable scar tissue superior to mastectomy scar at approx 10 o'clock position; not concerning for malignancy.  -Axilla: No palpable adenopathy bilaterally. Mild right axillary lymphedema noted.  GU: Deferred.   GI: Soft, non-tender abdomen. Normoactive bowel sounds. No hepatosplenomegaly.   Neuro: No focal deficits. Steady gait.   Psych: Normal mood and affect for situation. Extremities: No edema.  Skin: Warm and dry.    LABORATORY DATA: CBC Latest Ref Rng & Units 10/26/2015 09/23/2014 03/18/2014  WBC 3.4 - 10.8 x10E3/uL 8.2 8.2 8.0  Hemoglobin 12.0 - 15.0 g/dL - - 16.5(H)  Hematocrit 34.0 - 46.6 % 52.0(H) 46.5 46.8(H)  Platelets 150 - 379 x10E3/uL 187 181 184   CMP Latest Ref Rng & Units 10/26/2015 09/23/2014 03/18/2014  Glucose 65 - 99 mg/dL 93 90 86  BUN 6 - 24 mg/dL 17 10 9   Creatinine 0.57 - 1.00 mg/dL 0.86 0.77 0.77  Sodium 134 - 144 mmol/L 142 142 141  Potassium 3.5 - 5.2 mmol/L 5.2 4.0 4.2  Chloride 96 - 106 mmol/L 99 103 102  CO2 18 - 29 mmol/L 24 23 26   Calcium 8.7 - 10.2 mg/dL 9.6 8.9 9.1  Total Protein 6.0 - 8.5 g/dL 6.7 6.1 7.1  Total Bilirubin 0.0 - 1.2 mg/dL 0.3 <0.2 0.3  Alkaline Phos 39 - 117 IU/L 118(H) 134(H) 133(H)  AST 0 - 40 IU/L 13 15 15   ALT 0 - 32 IU/L 11 11 11         DIAGNOSTIC IMAGING:  Most recent mammogram: 04/04/16 [facility])       ASSESSMENT/PLAN:  Ms. Kue is a 55 y.o. female with history of Stage IA right breast invasive ductal carcinoma, ER+/PR+/HER2-, diagnosed in 12/2008; underwent right mastectomy with sentinel lymph node biopsy demonstrating 1.7 cm invasive disease, 0/3 sentinel lymph nodes. Received 3 cycles of  Taxotere/Cytoxan chemotherapy.  She was unable to tolerate anti-estrogen therapy and now remains on surveillance alone.  She presents to [facility] for continued  follow-up and surveillance.   1. History of Stage IA right breast cancer: Ms. Eshbach is clinically without evidence of disease or recurrence of breast cancer.  She had her left unilateral screening mammogram at [facility] on Monday, 04/04/16 and was normal. (Results of mammogram received after patient had left clinic; we will call her with these results as she was anxious about them).  She is now 7 years out from her original breast cancer diagnosis without evidence of recurrence, which is very favorable.  We discussed that we will plan on continuing to see her annually for continued breast cancer surveillance as long as she would like to continue to be seen.  She does not see her PCP very regularly for a physical, so I would feel better for [place] to continue to follow her until she re-establishes more routine care with her PCP.  We will plan on seeing her in 1 year for continued breast cancer surveillance.  She will be due for left breast screening mammogram in 03/2017; orders placed today.    2. Tobacco use disorder: Ms. Shimkus is currently smoking about 1/2-1 ppd, with pain being the biggest trigger for her tobacco use.  She is very interested in stopping smoking.  We discussed the standard of care for nicotine dependence is either nicotine replacement therapy (with patches and gum/lozenges) or Varenacline (Chantix).  However she does have a history of a seizure disorder, which makes Chantix contraindicated for her as this medication lowers the seizure threshold. She would be willing to consider nicotine replacement therapy with patches, but is not sure they would be effective for her. We also discussed that we would try BID dosing of Wellbutrin XL, as it is also a potential option for her. She denies any severe anxiety issues, which would be  contraindicated with Wellbutrin therapy. We discussed the physiology of nicotine dependence, and different strategies both behaviorally and pharmacologically to manage tobacco use disorder. She does have a pretty high dependence for tobacco.  Based on her preliminary Fagenstrom assessment,  she expressed needing to smoke within about 5 minutes of waking up in the morning (most suggestive of high nicotine dependence).  She understands the impact that tobacco use is having on her health, and really wants to stop smoking soon. We discussed the importance of establishing a "Quit Date." This would be a day where she was committed to no longer buying or smoking any cigarettes ever again in the future. I think it is important for her to have continued counseling and support in terms of smoking cessation. Therefore, I will bring her back in about 2 months for further tobacco cessation counseling and development of a more solid treatment plan. She is open and willing to be a part of continued efforts to wean down and stop smoking. Encouraged her to continue to wean down her use, as she is able.  I will see her back soon to re-evaluate.  Greater than 10 minutes was spent in smoking cessation counseling the patient today. All   Dispo:  -Return to cancer center in about 2 months to see me for further smoking cessation counseling and intervention.    THERAPY PLAN:  NCCN guidelines recommends the following surveillance for invasive breast cancer:  A. History and Physical exam every 4-6 months for 5 years and then every 12 months.  B. Mammography every 12 months  C. Women on Tamoxifen: annual gynecologic assessment every 12 months if uterus is present.  D. Women on aromatase inhibitor or who  experience ovarian failure secondary to treatment should have monitoring of bone health with a bone mineral density determination at baseline and periodically thereafter.  E. Assess and encourage adherence to adjuvant endocrine  therapy.  F. Evidence suggests that active lifestyle and achieving and maintaining an ideal body weight (20-25 BMI) may lead to optimal breast cancer outcomes.   All questions were answered. The patient knows to call the clinic with any problems, questions or concerns. We can certainly see the patient much sooner if necessary.  Patient and plan discussed with Dr. Ancil Linsey and she is in agreement with the aforementioned.   A total of 35 minutes was spent in face-to-face care of this patient, with greater than 50% of that time spent in counseling and care-coordination.   Mike Craze, NP [place] [phone]

## 2016-04-05 ENCOUNTER — Encounter (HOSPITAL_COMMUNITY): Payer: Medicare Other | Attending: Adult Health | Admitting: Adult Health

## 2016-04-05 ENCOUNTER — Ambulatory Visit (HOSPITAL_COMMUNITY): Payer: Medicare Other | Admitting: Hematology & Oncology

## 2016-04-05 ENCOUNTER — Encounter (HOSPITAL_COMMUNITY): Payer: Self-pay | Admitting: Adult Health

## 2016-04-05 VITALS — BP 128/71 | HR 73 | Temp 98.8°F | Resp 18 | Ht 62.0 in | Wt 134.1 lb

## 2016-04-05 DIAGNOSIS — Z720 Tobacco use: Secondary | ICD-10-CM | POA: Diagnosis not present

## 2016-04-05 DIAGNOSIS — Z1231 Encounter for screening mammogram for malignant neoplasm of breast: Secondary | ICD-10-CM

## 2016-04-05 DIAGNOSIS — Z853 Personal history of malignant neoplasm of breast: Secondary | ICD-10-CM

## 2016-04-05 DIAGNOSIS — C50111 Malignant neoplasm of central portion of right female breast: Secondary | ICD-10-CM

## 2016-04-05 DIAGNOSIS — Z170 Estrogen receptor positive status [ER+]: Secondary | ICD-10-CM

## 2016-04-05 NOTE — Patient Instructions (Signed)
[place] at [facility] Discharge Instructions  RECOMMENDATIONS MADE BY THE CONSULTANT AND ANY TEST RESULTS WILL BE SENT TO YOUR REFERRING PHYSICIAN.  You saw Mike Craze, NP today. See Amy at checkout for appointments.  Thank you for choosing [place] at [facility] to provide your oncology and hematology care.  To afford each patient quality time with our provider, please arrive at least 15 minutes before your scheduled appointment time.   Beginning January 23rd 2017 lab work for the [facility] will be done in the  Main lab at [facility] on 1st floor. If you have a lab appointment with the [facility] please come in thru the  Main Entrance and check in at the main information desk  You need to re-schedule your appointment should you arrive 10 or more minutes late.  We strive to give you quality time with our providers, and arriving late affects you and other patients whose appointments are after yours.  Also, if you no show three or more times for appointments you may be dismissed from the clinic at the providers discretion.     Again, thank you for choosing [facility].  Our hope is that these requests will decrease the amount of time that you wait before being seen by our physicians.       _____________________________________________________________  Should you have questions after your visit to [facility], please contact our office at [phone] between the hours of 8:30 a.m. and 4:30 p.m.  Voicemails left after 4:30 p.m. will not be returned until the following business day.  For prescription refill requests, have your pharmacy contact our office.         Resources For Cancer Patients and their Caregivers ? American Cancer Society: Can assist with transportation, wigs, general needs, runs Look Good Feel Better.        785-246-0639 ? Cancer Care: Provides financial assistance, online  support groups, medication/co-pay assistance.  1-800-813-HOPE 479-273-6789) ? [facility] Assists [place] cancer patients and their families through emotional , educational and financial support.  989-808-2970 ? [place] DSS Where to apply for food stamps, Medicaid and utility assistance. (308)427-8296 ? [facility]: Transportation to medical appointments. (406) 872-8347 ? Social Security Administration: May apply for disability if have a Stage IV cancer. 817 365 9034 747 383 8795 ? [employer], Disability and Transit Services: Assists with nutrition, care and transit needs. [facility] Support Programs: @10RELATIVEDAYS @ > Cancer Support Group  2nd Tuesday of the month 1pm-2pm, Journey Room  > Creative Journey  3rd Tuesday of the month 1130am-1pm, Journey Room  > Look Good Feel Better  1st Wednesday of the month 10am-12 noon, Journey Room (Call [facility] to register [phone])

## 2016-04-06 ENCOUNTER — Telehealth: Payer: Self-pay | Admitting: *Deleted

## 2016-04-06 NOTE — Telephone Encounter (Signed)
Called pt to inform her of mammo results which showed no malignancy. Pt was pleased with the results and thanked [place] for the call. No further concerns. Message to be fwd to [employer].

## 2016-04-28 ENCOUNTER — Telehealth: Payer: Self-pay | Admitting: Nurse Practitioner

## 2016-04-28 NOTE — Telephone Encounter (Signed)
Patient is calling to get authorization for her to take brand name KEPPRA 500 MG tablet and phenytoin (DILANTIN) 100 MG ER capsule. She said to call [employer] at [phone] and give authorization to them.

## 2016-05-03 ENCOUNTER — Other Ambulatory Visit: Payer: Self-pay | Admitting: Cardiovascular Disease

## 2016-05-06 NOTE — Telephone Encounter (Signed)
Called  [place] dept of optum rx.  [employer] [phone]  ID # [id number].  [place] keppra BN  [id number] Dilantin BN [id number] sent over clinical review.  Pending approval.

## 2016-05-09 NOTE — Telephone Encounter (Signed)
Pt said she is returning RN's call

## 2016-05-09 NOTE — Telephone Encounter (Signed)
Received fax that Dilantin is on list of covered drugs and PA not needed.  Need clinical information for Keppra.  I called pt and and asked her to she had tried generic levetiracetam.  She has not.  She has been on generics for lamotrigine and phenytoin.  She has had problems with anaphylaxis due to generics and other ingredients.  She states she will not change any of her medications.  Form completed and faxed.

## 2016-05-10 MED ORDER — LEVETIRACETAM 500 MG PO TABS: 500.0000 mg | ORAL_TABLET | Freq: Two times a day (BID) | ORAL | 6 refills | Status: DC

## 2016-05-10 NOTE — Telephone Encounter (Signed)
Patient is returning your call.  

## 2016-05-10 NOTE — Telephone Encounter (Addendum)
I called pt and she relayed that she had spoken to the insurance company twice since have sent information in for the PA on her BN Keppra.  They have denied her for [facility] and she states that she will try the generic.  She is asking if we would also send in prescription for epi-pen.  She is concerned that she will have problem like she had with lamictal (anaphylaxis).   I told her that was not something we normally prescribe and she may need to get from pcp.  She asked about having keppra levels done (as she had problems with absorbing generic dilantin).  I told her that that is a level we can check.  (not a usual one that is checked).   RV 10-25-16 (annual).

## 2016-05-10 NOTE — Addendum Note (Signed)
Addended byOliver Hum on: 05/10/2016 04:39 PM   Modules accepted: Orders

## 2016-05-10 NOTE — Addendum Note (Signed)
Addended byOliver Hum on: 05/10/2016 02:45 PM   Modules accepted: Orders

## 2016-05-13 ENCOUNTER — Telehealth: Payer: Self-pay | Admitting: *Deleted

## 2016-05-13 MED ORDER — LEVETIRACETAM 500 MG PO TABS: 500.0000 mg | ORAL_TABLET | Freq: Two times a day (BID) | ORAL | 6 refills | Status: DC

## 2016-05-13 NOTE — Telephone Encounter (Signed)
I spoke to pt and she had not gotten call from [facility] about generic keppra.  She needed to use [facility].   [facility].  I called [facility] and cancelled the other prescription.  I told her the EPI PEN would need to come from her pcp.  I would ask about keppra levels to be drawn as pt wanted to check on absorption.

## 2016-05-16 ENCOUNTER — Other Ambulatory Visit: Payer: Self-pay | Admitting: *Deleted

## 2016-05-16 DIAGNOSIS — R569 Unspecified convulsions: Secondary | ICD-10-CM

## 2016-05-16 NOTE — Telephone Encounter (Signed)
Spoke to pt and she will get trough keppra level after 3 wks taking levetiricetam.  She verbalized understanding.

## 2016-05-16 NOTE — Telephone Encounter (Signed)
She can have Keppra level drawn in about 3 weeks after being on the generic

## 2016-05-19 NOTE — Progress Notes (Signed)
Valid Spoke to pt this am.  I relayed that got appro thru val in-- the mail for the BN KEPPRA  (dated 05-11-16).  [employer] [facility] Rx Plans.  PA [id number] valid thru 05-06-2016 thru 04-17-17.  Case # [id number] WN.  Pt stated that she did received same letter yesterday and she called insurance co.  She stated we will be sent letter revoking the approval.  Will be intouch as needed,

## 2016-05-24 DIAGNOSIS — M47816 Spondylosis without myelopathy or radiculopathy, lumbar region: Secondary | ICD-10-CM | POA: Diagnosis not present

## 2016-07-28 ENCOUNTER — Ambulatory Visit (HOSPITAL_COMMUNITY): Payer: Medicare Other

## 2016-08-08 ENCOUNTER — Telehealth (HOSPITAL_COMMUNITY): Payer: Self-pay | Admitting: *Deleted

## 2016-08-09 ENCOUNTER — Other Ambulatory Visit (HOSPITAL_COMMUNITY): Payer: Self-pay

## 2016-08-09 DIAGNOSIS — Z170 Estrogen receptor positive status [ER+]: Secondary | ICD-10-CM

## 2016-08-09 DIAGNOSIS — C50111 Malignant neoplasm of central portion of right female breast: Secondary | ICD-10-CM

## 2016-08-09 MED ORDER — MISC. DEVICES MISC: 0 refills | Status: DC

## 2016-08-09 NOTE — Telephone Encounter (Signed)
Prescription faxed to [facility].

## 2016-08-16 ENCOUNTER — Ambulatory Visit (HOSPITAL_COMMUNITY): Payer: Medicare Other

## 2016-08-25 ENCOUNTER — Ambulatory Visit (HOSPITAL_COMMUNITY): Payer: Medicare Other

## 2016-09-27 DIAGNOSIS — M4302 Spondylolysis, cervical region: Secondary | ICD-10-CM | POA: Diagnosis not present

## 2016-09-27 DIAGNOSIS — M542 Cervicalgia: Secondary | ICD-10-CM | POA: Diagnosis not present

## 2016-09-27 DIAGNOSIS — R200 Anesthesia of skin: Secondary | ICD-10-CM | POA: Diagnosis not present

## 2016-10-06 DIAGNOSIS — M4302 Spondylolysis, cervical region: Secondary | ICD-10-CM | POA: Diagnosis not present

## 2016-10-06 DIAGNOSIS — M542 Cervicalgia: Secondary | ICD-10-CM | POA: Diagnosis not present

## 2016-10-06 DIAGNOSIS — M50323 Other cervical disc degeneration at C6-C7 level: Secondary | ICD-10-CM | POA: Diagnosis not present

## 2016-10-06 DIAGNOSIS — M9981 Other biomechanical lesions of cervical region: Secondary | ICD-10-CM | POA: Diagnosis not present

## 2016-10-07 ENCOUNTER — Other Ambulatory Visit: Payer: Self-pay | Admitting: Cardiovascular Disease

## 2016-10-07 DIAGNOSIS — M4302 Spondylolysis, cervical region: Secondary | ICD-10-CM | POA: Diagnosis not present

## 2016-10-07 DIAGNOSIS — M47816 Spondylosis without myelopathy or radiculopathy, lumbar region: Secondary | ICD-10-CM | POA: Diagnosis not present

## 2016-10-10 ENCOUNTER — Other Ambulatory Visit: Payer: Self-pay | Admitting: Neurosurgery

## 2016-10-10 DIAGNOSIS — M4302 Spondylolysis, cervical region: Secondary | ICD-10-CM

## 2016-10-20 ENCOUNTER — Ambulatory Visit
Admission: RE | Admit: 2016-10-20 | Discharge: 2016-10-20 | Disposition: A | Discharge status: Home / Self Care | Payer: Medicare Other | Source: Ambulatory Visit | Attending: Neurosurgery | Admitting: Neurosurgery

## 2016-10-20 DIAGNOSIS — M542 Cervicalgia: Secondary | ICD-10-CM | POA: Diagnosis not present

## 2016-10-20 DIAGNOSIS — M4302 Spondylolysis, cervical region: Secondary | ICD-10-CM

## 2016-10-20 MED ORDER — TRIAMCINOLONE ACETONIDE 40 MG/ML IJ SUSP (RADIOLOGY)
60.0000 mg | Freq: Once | INTRAMUSCULAR | Status: AC
  Administered 2016-10-20: 60 mg via EPIDURAL

## 2016-10-20 MED ORDER — IOPAMIDOL (ISOVUE-M 300) INJECTION 61%
1.0000 mL | Freq: Once | INTRAMUSCULAR | Status: AC | PRN
  Administered 2016-10-20: 1 mL via EPIDURAL

## 2016-10-20 NOTE — Discharge Instructions (Signed)

## 2016-10-25 ENCOUNTER — Ambulatory Visit (INDEPENDENT_AMBULATORY_CARE_PROVIDER_SITE_OTHER): Payer: Medicare Other | Admitting: Nurse Practitioner

## 2016-10-25 ENCOUNTER — Encounter: Payer: Self-pay | Admitting: Nurse Practitioner

## 2016-10-25 VITALS — BP 118/80 | HR 72 | Wt 138.8 lb

## 2016-10-25 DIAGNOSIS — G40209 Localization-related (focal) (partial) symptomatic epilepsy and epileptic syndromes with complex partial seizures, not intractable, without status epilepticus: Secondary | ICD-10-CM

## 2016-10-25 DIAGNOSIS — Z5181 Encounter for therapeutic drug level monitoring: Secondary | ICD-10-CM

## 2016-10-25 NOTE — Patient Instructions (Signed)
Continue Keppra at current dose, will refill when labs are back Continue Dilantin at current dose will refill when labs are back CBC, CMP and Dilantin level  And Keppra level in am  Follow-up yearly

## 2016-10-25 NOTE — Progress Notes (Signed)
[facility]  PATIENT: Wendy Lopez DOB: 29-May-1960   REASON FOR VISIT: Follow-up for seizure disorder,  HISTORY FROM: Patient    HISTORY OF PRESENT ILLNESS:Ms. Bacorn is a 56 year old right-handed white female with a history of seizures. She returns for yearly follow-up The patient has been on Lamictal and Dilantin. Her Dilantin dose was decreased to 300 mg at bedtime and since that time, she feels that she has done better with the vision. The patient no longer seen a purple haze. She indicates that her cognitive abilities have improved, and her gait stability has improved. The patient has not had any further amnestic events. She is now back to driving a motor vehicle. She was on Lamictal switched  to generic and had side effects, . of dizziness headache and tingling in the hands. She was then switched to [place]. She switched to a generic this past April because her insurance would not longer pay for brand drug. She says it took her about 6 weeks to get used to the generic. She is also on brand Dilantin she claims she has had side effects to generics in the past. She returns for reevaluation and refills. She had epidural injection in the neck  by Dr. Carloyn Manner last week and claims she has had 2 auras since. She returns for reevaluation  REVIEW OF SYSTEMS: Full 14 system review of systems performed and notable only for those listed, all others are neg:  Constitutional: neg  Cardiovascular:Palpitations  Ear/Nose/Throat: neg  Skin: neg Eyes: neg Respiratory: neg Gastroitestinal: neg  Hematology/Lymphatic: neg  Endocrine: neg Musculoskeletal: Joint pain, neck pain Allergy/Immunology: Food allergies  Neurological: Seizure disorder  Psychiatric: neg Sleep : neg   ALLERGIES: Allergies  Allergen Reactions  . Carbamazepine Anaphylaxis  . Lamictal [Lamotrigine] Anaphylaxis    SOB, Lamotrigine only  . Other     STATES HAS SHORTNESS OF BREATH WITH GENERIC  DRUGS     ALSO RASH AND ITCHING  . Pseudoephedrine Hcl Er Other (See Comments)    SEIZURES.  . Valium Other (See Comments)    Hallucinations, spasticity, hyper-relfexive   . Amlodipine Rash    Face swell  . Oxycodone Nausea And Vomiting and Rash  . Prednisone Rash and Other (See Comments)    Prescribing MD told patient he thinks dose (dose pack) was "just too high." 03/09/11   STATES HAS TAKEN SMALLER DOSE AND STILL HAD RASH AND JITTERINESS  . Ibuprofen Swelling  . Percocet [Oxycodone-Acetaminophen] Nausea And Vomiting  . Betadine [Povidone Iodine] Rash  . Latex Dermatitis and Rash  . Shellfish Allergy Swelling    HOME MEDICATIONS: Outpatient Medications Prior to Visit  Medication Sig Dispense Refill  . aspirin-acetaminophen-caffeine (EXCEDRIN EXTRA STRENGTH) 250-250-65 MG per tablet Take 1 tablet by mouth every 6 (six) hours as needed. Pain    . cetirizine (ZYRTEC) 10 MG tablet Take 10 mg by mouth daily.    . cyanocobalamin 100 MCG tablet Take 100 mcg by mouth daily.    Marland Kitchen HYDROcodone-acetaminophen (NORCO/VICODIN) 5-325 MG per tablet Take 1 tablet by mouth every 6 (six) hours as needed for pain. Reported on 04/07/2015    . levETIRAcetam (KEPPRA) 500 MG tablet Take 1 tablet (500 mg total) by mouth 2 (two) times daily. 60 tablet 6  . metoprolol succinate (TOPROL-XL) 100 MG 24 hr tablet TAKE 1 TABLET BY MOUTH TWICE DAILY 60 tablet 0  . Misc. Devices MISC Please provide patient with right breast prosthesis and 6 mastectomy bras sue to a  history of right breast cancer and mastectomy. (ICD10- C50.111) 6 each 0  . phenytoin (DILANTIN) 100 MG ER capsule Take 1 capsule (100 mg total) by mouth 3 (three) times daily. BRAND 90 capsule 11   No facility-administered medications prior to visit.     PAST MEDICAL HISTORY: Past Medical History:  Diagnosis Date  . Anxiety and depression   . Arthritis   . Breast cancer ([place]) 02/13/2009  . Cancer of right breast ([place]) 04/16/2012  . Depression     . Depression 2014  . Dysrhythmia    palpitations  . Epilepsy ([place])    last seizure 1990's  . H/O hiatal hernia   . Localization-related (focal) (partial) epilepsy and epileptic syndromes with complex partial seizures, without mention of intractable epilepsy 02/07/2013  . Memory deficit 02/07/2013  . Memory loss   . Mitral valve prolapse 06/01/10   echo- EF>55% mild tricupsid regurgitation. There is mild Pulmonary Hypertension.. Right  ventricular systolic pressure iselevated at 30-40 mmHg. LV systolic function is normal.  . Osteopenia   . PONV (postoperative nausea and vomiting)   . SOB (shortness of breath) 10/18/11   met-test  . 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 BACK SURGERY     screws- lumbar  . BREAST SURGERY  2010   mastectomy - RIGHT  . COLONOSCOPY WITH PROPOFOL  03/13/2012   Procedure: COLONOSCOPY WITH PROPOFOL;  Surgeon: Garlan Fair, MD;  Location: [facility];  Service: Endoscopy;  Laterality: N/A;  . ESOPHAGOGASTRODUODENOSCOPY  03/13/2012   Procedure: ESOPHAGOGASTRODUODENOSCOPY (EGD);  Surgeon: Garlan Fair, MD;  Location: [facility];  Service: Endoscopy;  Laterality: N/A;  Reflux, Hiatal Hernia  . HAND TENDON SURGERY  2012   tendons released in right hand   . MASTECTOMY, RADICAL  02/13/2009  . [place], 2003, 2005  . TONSILLECTOMY      FAMILY HISTORY: Family History  Problem Relation Age of Onset  . Arthritis Mother        rhuematoid  . Heart disease Mother        Atrial fib  . Heart disease Father   . Heart disease Sister        Atrial fib    SOCIAL HISTORY: Social History   Social History  . Marital status: Married    Spouse name: N/A  . Number of children: 1  . Years of education: college 2   Occupational History  . unemployed Unemployed    2003   Social History Main Topics  . Smoking status: Current Every Day Smoker    Packs/day: 0.50     Types: Cigarettes  . Smokeless tobacco: Never Used     Comment: 1/2 pk per day  . Alcohol use No  . Drug use: No  . Sexual activity: Not on file   Other Topics Concern  . Not on file   Social History Narrative   Patient is married with one child.   Patient is right handed.   Patient has 2 yrs of education.   Patient does not drink caffeine.     PHYSICAL EXAM  Vitals:   10/25/16 1300  BP: 118/80  Pulse: 72  Weight: 138 lb 12.8 oz (63 kg)   Body mass index is 25.39 kg/m. General: The patient is alert and cooperative at the time of the examination. Skin: No significant peripheral edema is noted.   Neurologic Exam Mental  status: The patient is oriented x 3. Cranial nerves: Facial symmetry is present. Speech is normal, no aphasia or dysarthria is noted. Extraocular movements are full. Visual fields are full. Motor: The patient has good strength in all 4 extremities. No focal weakness Sensory examination: Soft touch sensation is symmetric on the face, arms, and legs. Coordination: The patient has good finger-nose-finger and heel-to-shin bilaterally. Gait and station: The patient has a normal gait. Tandem gait is steady . Romberg is negative. No drift is seen. Reflexes: Deep tendon reflexes are symmetric.   DIAGNOSTIC DATA (LABS, IMAGING, TESTING)   ASSESSMENT AND PLAN 56 y.o. year old female has a past medical history of seizure disorder currently well-controlled on Keppra   generic which was switched from brand in April 2018 and Dilantin BRAND drug. Mild memory loss which has improved with medication change. No further amnesia events. 2 auras lasting one second after neck epidural last week.  Continue Keppra at current dose, will refill when labs are back Continue Dilantin at current dose will refill when labs are back CBC, CMP to monitor adverse effects of seizure medications Dilantin level  And Keppra level in am To monitor for therapeutic levels  Follow-up  yearly Dennie Bible, [facility], [facility], [facility] [address] [place], [place] [address] [phone]

## 2016-10-25 NOTE — Progress Notes (Signed)
I have read the note, and I agree with the clinical assessment and plan.  WILLIS,CHARLES KEITH   

## 2016-10-26 ENCOUNTER — Other Ambulatory Visit (INDEPENDENT_AMBULATORY_CARE_PROVIDER_SITE_OTHER): Payer: Self-pay

## 2016-10-26 DIAGNOSIS — G40209 Localization-related (focal) (partial) symptomatic epilepsy and epileptic syndromes with complex partial seizures, not intractable, without status epilepticus: Secondary | ICD-10-CM | POA: Diagnosis not present

## 2016-10-26 DIAGNOSIS — Z0289 Encounter for other administrative examinations: Secondary | ICD-10-CM

## 2016-10-28 ENCOUNTER — Other Ambulatory Visit: Payer: Self-pay | Admitting: *Deleted

## 2016-10-28 ENCOUNTER — Telehealth: Payer: Self-pay | Admitting: Nurse Practitioner

## 2016-10-28 DIAGNOSIS — R569 Unspecified convulsions: Secondary | ICD-10-CM

## 2016-10-28 LAB — COMPREHENSIVE METABOLIC PANEL
ALT: 33 IU/L — ABNORMAL HIGH (ref 0–32)
AST: 26 IU/L (ref 0–40)
Albumin/Globulin Ratio: 1.7 (ref 1.2–2.2)
Albumin: 4 g/dL (ref 3.5–5.5)
Alkaline Phosphatase: 107 IU/L (ref 39–117)
BUN/Creatinine Ratio: 17 (ref 9–23)
BUN: 13 mg/dL (ref 6–24)
Bilirubin Total: 0.3 mg/dL (ref 0.0–1.2)
CO2: 24 mmol/L (ref 20–29)
Calcium: 9 mg/dL (ref 8.7–10.2)
Chloride: 104 mmol/L (ref 96–106)
Creatinine, Ser: 0.76 mg/dL (ref 0.57–1.00)
GFR calc Af Amer: 101 mL/min/{1.73_m2} (ref >59)
GFR calc non Af Amer: 88 mL/min/{1.73_m2} (ref >59)
Globulin, Total: 2.3 g/dL (ref 1.5–4.5)
Glucose: 87 mg/dL (ref 65–99)
Potassium: 4.1 mmol/L (ref 3.5–5.2)
Sodium: 143 mmol/L (ref 134–144)
Total Protein: 6.3 g/dL (ref 6.0–8.5)

## 2016-10-28 LAB — CBC WITH DIFFERENTIAL/PLATELET
Basophils Absolute: 0.1 10*3/uL (ref 0.0–0.2)
Basos: 1 %
EOS (ABSOLUTE): 0.2 10*3/uL (ref 0.0–0.4)
Eos: 2 %
Hematocrit: 50 % — ABNORMAL HIGH (ref 34.0–46.6)
Hemoglobin: 16.6 g/dL — ABNORMAL HIGH (ref 11.1–15.9)
Immature Grans (Abs): 0 10*3/uL (ref 0.0–0.1)
Immature Granulocytes: 0 %
Lymphocytes Absolute: 3.7 10*3/uL — ABNORMAL HIGH (ref 0.7–3.1)
Lymphs: 38 %
MCH: 31.5 pg (ref 26.6–33.0)
MCHC: 33.2 g/dL (ref 31.5–35.7)
MCV: 95 fL (ref 79–97)
Monocytes Absolute: 0.7 10*3/uL (ref 0.1–0.9)
Monocytes: 7 %
Neutrophils Absolute: 5.1 10*3/uL (ref 1.4–7.0)
Neutrophils: 52 %
Platelets: 153 10*3/uL (ref 150–379)
RBC: 5.27 x10E6/uL (ref 3.77–5.28)
RDW: 13.3 % (ref 12.3–15.4)
WBC: 9.8 10*3/uL (ref 3.4–10.8)

## 2016-10-28 LAB — LEVETIRACETAM LEVEL: Levetiracetam Lvl: 7 ug/mL — ABNORMAL LOW (ref 10.0–40.0)

## 2016-10-28 LAB — PHENYTOIN LEVEL, TOTAL: Phenytoin (Dilantin), Serum: 8.4 ug/mL — ABNORMAL LOW (ref 10.0–20.0)

## 2016-10-28 MED ORDER — PHENYTOIN SODIUM EXTENDED 100 MG PO CAPS: 100.0000 mg | ORAL_CAPSULE | Freq: Three times a day (TID) | ORAL | 11 refills | Status: DC

## 2016-10-28 MED ORDER — LEVETIRACETAM 500 MG PO TABS: ORAL_TABLET | ORAL | 11 refills | Status: DC

## 2016-10-28 NOTE — Telephone Encounter (Signed)
2 weeks.  

## 2016-10-28 NOTE — Telephone Encounter (Signed)
Spoke with patient and informed her that her CBC and CMP were normal. Advised her that Keppra level was a little low. Advised her that Cecille Rubin wants her to continue taking Keppra 1 tab in morning but to increase to taking two tabs in evening. Advised her to keep Dilantin at the current dose.  Advised she call for any spells or concerns. Patient asked when she is to return for recheck of Keppra level. This RN stated will have to call her back after discussing with NP. Patient requested a VM be left if she doesn't answer. She had no further questions, verbalized understanding of call.

## 2016-10-28 NOTE — Telephone Encounter (Signed)
CBC and CMP WNL. Keppra level a little low. Please continue Keppra  1 in the am and increase to 2 in the pm. Continue Dilantin at current dose.Please call the pt. Call for any spells

## 2016-10-28 NOTE — Telephone Encounter (Signed)
Called patient and advised she come in two weeks to have trough level of Keppra drawn. She stated she will come in at 8 am before she takes Keppra. She verbalized understanding, appreciation of call.

## 2016-11-03 ENCOUNTER — Other Ambulatory Visit: Payer: Self-pay | Admitting: Cardiovascular Disease

## 2016-11-08 ENCOUNTER — Other Ambulatory Visit: Payer: Self-pay | Admitting: Neurosurgery

## 2016-11-08 DIAGNOSIS — M4302 Spondylolysis, cervical region: Secondary | ICD-10-CM

## 2016-11-15 ENCOUNTER — Other Ambulatory Visit (INDEPENDENT_AMBULATORY_CARE_PROVIDER_SITE_OTHER): Payer: Self-pay

## 2016-11-15 DIAGNOSIS — R569 Unspecified convulsions: Secondary | ICD-10-CM | POA: Diagnosis not present

## 2016-11-15 DIAGNOSIS — Z0289 Encounter for other administrative examinations: Secondary | ICD-10-CM

## 2016-11-16 ENCOUNTER — Ambulatory Visit
Admission: RE | Admit: 2016-11-16 | Discharge: 2016-11-16 | Disposition: A | Discharge status: Home / Self Care | Payer: Medicare Other | Source: Ambulatory Visit | Attending: Neurosurgery | Admitting: Neurosurgery

## 2016-11-16 DIAGNOSIS — M4302 Spondylolysis, cervical region: Secondary | ICD-10-CM

## 2016-11-16 MED ORDER — TRIAMCINOLONE ACETONIDE 40 MG/ML IJ SUSP (RADIOLOGY): 60.0000 mg | Freq: Once | INTRAMUSCULAR | Status: DC

## 2016-11-16 MED ORDER — IOPAMIDOL (ISOVUE-M 300) INJECTION 61%: 1.0000 mL | Freq: Once | INTRAMUSCULAR | Status: DC | PRN

## 2016-11-16 NOTE — Discharge Instructions (Signed)

## 2016-11-17 LAB — LEVETIRACETAM LEVEL: Levetiracetam Lvl: 21.5 ug/mL (ref 10.0–40.0)

## 2016-11-18 ENCOUNTER — Telehealth: Payer: Self-pay | Admitting: *Deleted

## 2016-11-18 NOTE — Telephone Encounter (Signed)
LVM informing patient her Keppra level is good, no medication changes. Repeated this information, left office number with hours of rtoday.

## 2016-11-24 ENCOUNTER — Other Ambulatory Visit: Payer: Self-pay | Admitting: Cardiovascular Disease

## 2016-11-28 ENCOUNTER — Other Ambulatory Visit: Payer: Self-pay | Admitting: *Deleted

## 2016-11-28 MED ORDER — METOPROLOL SUCCINATE ER 100 MG PO TB24
100.0000 mg | ORAL_TABLET | Freq: Two times a day (BID) | ORAL | 0 refills | Status: DC
Start: 2016-11-28 — End: 2017-02-20

## 2016-12-07 DIAGNOSIS — M47812 Spondylosis without myelopathy or radiculopathy, cervical region: Secondary | ICD-10-CM | POA: Diagnosis not present

## 2017-01-26 ENCOUNTER — Ambulatory Visit: Payer: Medicare Other | Admitting: Cardiovascular Disease

## 2017-02-01 ENCOUNTER — Ambulatory Visit (INDEPENDENT_AMBULATORY_CARE_PROVIDER_SITE_OTHER): Payer: Medicare Other | Admitting: Cardiovascular Disease

## 2017-02-01 ENCOUNTER — Encounter: Payer: Self-pay | Admitting: Cardiovascular Disease

## 2017-02-01 VITALS — BP 116/77 | HR 69 | Ht 62.0 in | Wt 138.8 lb

## 2017-02-01 DIAGNOSIS — I10 Essential (primary) hypertension: Secondary | ICD-10-CM

## 2017-02-01 DIAGNOSIS — G40909 Epilepsy, unspecified, not intractable, without status epilepticus: Secondary | ICD-10-CM | POA: Diagnosis not present

## 2017-02-01 DIAGNOSIS — R002 Palpitations: Secondary | ICD-10-CM

## 2017-02-01 DIAGNOSIS — E785 Hyperlipidemia, unspecified: Secondary | ICD-10-CM

## 2017-02-01 DIAGNOSIS — Z720 Tobacco use: Secondary | ICD-10-CM

## 2017-02-01 DIAGNOSIS — Z79899 Other long term (current) drug therapy: Secondary | ICD-10-CM

## 2017-02-01 DIAGNOSIS — Z23 Encounter for immunization: Secondary | ICD-10-CM | POA: Diagnosis not present

## 2017-02-01 NOTE — Progress Notes (Signed)
Patient ID: Wendy Lopez, female   DOB: Mar 01, 1961, 56 y.o.   MRN: [id number]     HPI: Wendy Lopez is a 56 y.o. female who presents for a 20 month cardiology follow-up evaluation.   Wendy Lopez has a long-standing history of tobacco abuse and started smoking at age 21.  In 2009 cardiac catheterization revealed normal coronary arteries. In February 2012 a nuclear perfusion study was done after Wendy Lopez experienced episodes of chest pain and shortness of breath and this revealed normal perfusion. An echo Doppler study demonstrated mild pulmonary hypertension with estimated RV systolic pressure of 36 mm. Because of issues of recurrent shortness of breath Wendy Lopez underwent a cardiopulmonary met test and was found to have a blunted chronotropic response to exercise which is making it difficult for her to improve her endurance and exercise capacity. Wendy Lopez has a history of tachypalpitations. Wendy Lopez has a history of invasive ductal carcinoma and is status post right mastectomy with sentinel node biopsy and is status post chemotherapy. Wendy Lopez has a history of vitamin D insufficiency mild blood pressure elevation. There is also a history of depression.  On 10/16/2012 , a 2-D echo Doppler study  revealed an ejection fraction in the range of 60-65%. Wendy Lopez did not have wall motion abnormalities and had normal diastolic function. There was evidence for right ventricular hypertrophy with normal RV function. Wendy Lopez again had mild pulmonary hypertension with an estimated pressure 39 mm. There is moderate tricuspid regurgitation. Wendy Lopez continues to smoke cigarettes. Wendy Lopez does note some indigestion. He also has noticed some left leg weakness and has issues with L4-L5 disc disease. Wendy Lopez previously was on a higher dose of present Brintellex but due to tremors Wendy Lopez has reduced his dose to 5 mg daily.  A subsequent nuclear perfusion study was done on 11/20/2012 after her ECG revealed slight additional T-wave abnormalities and was  essentially normal without wall motion abnormality, scar or ischemia.  Wendy Lopez has a history of cervical disc disease and had noted some upper back discomfort in the past.  In December 2015, her EKG had shown  more pronounced downsloping ST segment depression in the inferior to inferolateral leads.  I recommended a subsequent nuclear perfusion study.  This was done on 04/17/2014 , which was not significant change from her previous study.  Wendy Lopez again had basal inferolateral ST-T changes with T-wave inversion in V3 through V4 which did not significant change with stress.  Wendy Lopez had normal perfusion with post-rest ejection fraction at 65%.  When I last saw her in 2016.  Wendy Lopez was smoking one half pack of cigarettes per day.  Wendy Lopez had had medication issues and was on Keppra in place of Lamictal for seizures due to cost. Recently, Wendy Lopez  had some issue with metoprolol succinate.  Wendy Lopez had self reduced this to 100 mg per day.  When I saw her several months ago.  Wendy Lopez was wanting to switch to a shorter acting version, however, noted more palpitations on the tartrate preparation. As result, Wendy Lopez put herself back on long-acting Toprol.  Over the last 2 years, Wendy Lopez has been without anginal symptoms.  Wendy Lopez quit smoking in December 2017, but unfortunately resumed during the period of increased stress 4 months later.  Unfortunately Wendy Lopez is still smoking cigarettes.  Wendy Lopez is no longer having caffeine or chocolate.  Wendy Lopez denies any exertional chest pain.  Her palpitations have been fairly well controlled on Toprol 200 mg daily.  Wendy Lopez denies seizure activity and is on Keppra and Dilantin.  Wendy Lopez presents for evaluation.  Past Medical History:  Diagnosis Date  . Anxiety and depression   . Arthritis   . Breast cancer ([place]) 02/13/2009  . Cancer of right breast ([place]) 04/16/2012  . Depression   . Depression 2014  . Dysrhythmia    palpitations  . Epilepsy ([place])    last seizure 1990's  . H/O hiatal hernia   . Localization-related (focal)  (partial) epilepsy and epileptic syndromes with complex partial seizures, without mention of intractable epilepsy 02/07/2013  . Memory deficit 02/07/2013  . Memory loss   . Mitral valve prolapse 06/01/10   echo- EF>55% mild tricupsid regurgitation. There is mild Pulmonary Hypertension.. Right  ventricular systolic pressure iselevated at 30-40 mmHg. LV systolic function is normal.  . Osteopenia   . PONV (postoperative nausea and vomiting)   . SOB (shortness of breath) 10/18/11   met-test  . 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 BACK SURGERY     screws- lumbar  . BREAST SURGERY  2010   mastectomy - RIGHT  . COLONOSCOPY WITH PROPOFOL  03/13/2012   Procedure: COLONOSCOPY WITH PROPOFOL;  Surgeon: Garlan Fair, MD;  Location: [facility];  Service: Endoscopy;  Laterality: N/A;  . ESOPHAGOGASTRODUODENOSCOPY  03/13/2012   Procedure: ESOPHAGOGASTRODUODENOSCOPY (EGD);  Surgeon: Garlan Fair, MD;  Location: [facility];  Service: Endoscopy;  Laterality: N/A;  Reflux, Hiatal Hernia  . HAND TENDON SURGERY  2012   tendons released in right hand   . MASTECTOMY, RADICAL  02/13/2009  . [place], 2003, 2005  . TONSILLECTOMY      Allergies  Allergen Reactions  . Carbamazepine Anaphylaxis  . Lamictal [Lamotrigine] Anaphylaxis    SOB, Lamotrigine only  . Other     STATES HAS SHORTNESS OF BREATH WITH GENERIC DRUGS     ALSO RASH AND ITCHING  . Pseudoephedrine Hcl Er Other (See Comments)    SEIZURES.  . Valium Other (See Comments)    Hallucinations, spasticity, hyper-relfexive   . Amlodipine Rash    Face swell  . Oxycodone Nausea And Vomiting and Rash  . Prednisone Rash and Other (See Comments)    Prescribing MD told patient he thinks dose (dose pack) was "just too high." 03/09/11   STATES HAS TAKEN SMALLER DOSE AND STILL HAD RASH AND JITTERINESS  . Ibuprofen Swelling  . Percocet [Oxycodone-Acetaminophen] Nausea And  Vomiting  . Betadine [Povidone Iodine] Rash  . Latex Dermatitis and Rash  . Shellfish Allergy Swelling    Current Outpatient Prescriptions  Medication Sig Dispense Refill  . aspirin-acetaminophen-caffeine (EXCEDRIN EXTRA STRENGTH) 250-250-65 MG per tablet Take 1 tablet by mouth every 6 (six) hours as needed. Pain    . cetirizine (ZYRTEC) 10 MG tablet Take 10 mg by mouth daily.    . cyanocobalamin 100 MCG tablet Take 100 mcg by mouth daily.    Marland Kitchen HYDROcodone-acetaminophen (NORCO/VICODIN) 5-325 MG per tablet Take 1 tablet by mouth every 6 (six) hours as needed for pain. Reported on 04/07/2015    . levETIRAcetam (KEPPRA) 500 MG tablet 1 in the am and 2 at hs 90 tablet 11  . metoprolol succinate (TOPROL-XL) 100 MG 24 hr tablet Take 1 tablet (100 mg total) by mouth 2 (two) times daily. Take with or immediately following a meal. 180 tablet 0  . Misc. Devices MISC Please provide patient with right breast prosthesis and 6 mastectomy bras sue to a history of right  breast cancer and mastectomy. (ICD10- C50.111) 6 each 0  . phenytoin (DILANTIN) 100 MG ER capsule Take 1 capsule (100 mg total) by mouth 3 (three) times daily. BRAND 90 capsule 11   No current facility-administered medications for this visit.     Social History   Social History  . Marital status: Married    Spouse name: N/A  . Number of children: 1  . Years of education: college 2   Occupational History  . unemployed Unemployed    2003   Social History Main Topics  . Smoking status: Current Every Day Smoker    Packs/day: 0.50    Types: Cigarettes  . Smokeless tobacco: Never Used     Comment: 1/2 pk per day  . Alcohol use No  . Drug use: No  . Sexual activity: Not on file   Other Topics Concern  . Not on file   Social History Narrative   Patient is married with one child.   Patient is right handed.   Patient has 2 yrs of education.   Patient does not drink caffeine.    Social Wendy Lopez is married and has one child. Wendy Lopez  has been smoking since age 58.  ROS General: Negative; No fevers, chills, or night sweats;  HEENT: Negative; No changes in vision or hearing, sinus congestion, difficulty swallowing Pulmonary: Negative; No cough, wheezing, shortness of breath, hemoptysis Cardiovascular: Negative; No chest pain, presyncope, syncope, palpitations GI: Negative; No nausea, vomiting, diarrhea, or abdominal pain GU: Negative; No dysuria, hematuria, or difficulty voiding Musculoskeletal: Negative; no myalgias, joint pain, or weakness Hematologic/Oncology: Remote history of stage I breast CA of the right breast with recent evaluation felt to be stable. Endocrine: Negative; no heat/cold intolerance; no diabetes Neuro: Remote history of seizure disorder for which Wendy Lopez had been on chronic Lamictal and Dilantin and is followed now by Dr. Jannifer Franklin; previously Wendy Lopez had been followed by Dr. Erling Cruz.  Wendy Lopez was switched to [place] due to cost since Wendy Lopez could not take generic Lamictal. Skin: Negative; No rashes or skin lesions Psychiatric: Negative; No behavioral problems, depression Sleep: Negative; No snoring, daytime sleepiness, hypersomnolence, bruxism, restless legs, hypnogognic hallucinations, no cataplexy Other comprehensive 14 point system review is negative.   PE BP 116/77   Pulse 69   Ht 5' 2"  (1.575 m)   Wt 138 lb 12.8 oz (63 kg)   LMP 11/03/1993   BMI 25.39 kg/m    Repeat blood pressure was 112/78 supine and 108/76 standing.  Wt Readings from Last 3 Encounters:  02/01/17 138 lb 12.8 oz (63 kg)  10/25/16 138 lb 12.8 oz (63 kg)  04/05/16 134 lb 1.6 oz (60.8 kg)   General: Alert, oriented, no distress.  Skin: normal turgor, no rashes, warm and dry HEENT: Normocephalic, atraumatic. Pupils equal round and reactive to light; sclera anicteric; extraocular muscles intact;  Nose without nasal septal hypertrophy Mouth/Parynx benign; Mallinpatti scale 2 Neck: No JVD, no carotid bruits; normal carotid upstroke Lungs:  clear to ausculatation and percussion; no wheezing or rales Chest wall: without tenderness to palpitation Heart: PMI not displaced, RRR, s1 s2 normal, 1/6 systolic murmur, no diastolic murmur, no rubs, gallops, thrills, or heaves Abdomen: soft, nontender; no hepatosplenomehaly, BS+; abdominal aorta nontender and not dilated by palpation. Back: no CVA tenderness Pulses 2+ Musculoskeletal: full range of motion, normal strength, no joint deformities Extremities: no clubbing cyanosis or edema, Homan's sign negative  Neurologic: grossly nonfocal; Cranial nerves grossly wnl Psychologic: Normal mood and affect   ECG (  independently read by me): Normal sinus rhythm at 69 bpm. ,  Probable left atrial enlargement.  Inferolateral ST changes.  Normal intervals.  No ectopy.  June 2017 ECG (independently read by me): Normal sinus rhythm at 71 bpm.  Will biatrial enlargement.  Inferolateral ST-T wave abnormality.  Normal intervals.  March 2017 ECG (independently read by me):  Normal sinus rhythm at 77 bpm.  Biatrial enlargement.  Previously noted inferolateral T wave abnormalities.  November 2016 ECG (independently read by me): Normal sinus rhythm at 75 bpm.  Inferolateral ST-T wave abnormalities.  03/31/2014 ECG (independently read by me) : Normal sinus rhythm at 75 bpm.  There is now significantly more pronounced downsloping ST segment depression in leads II, III, and F V4 through V6 and T-wave inversion in V3 compared to her prior ECG of over one year ago.  Prior ECG: Normal sinus rhythm at 68. T-wave abnormalities  inferiorly in V3 through V6  LABS:   BMP Latest Ref Rng & Units 10/26/2016 10/26/2015 09/23/2014  Glucose 65 - 99 mg/dL 87 93 90  BUN 6 - 24 mg/dL 13 17 10   Creatinine 0.57 - 1.00 mg/dL 0.76 0.86 0.77  BUN/Creat Ratio 9 - 23 17 20 13   Sodium 134 - 144 mmol/L 143 142 142  Potassium 3.5 - 5.2 mmol/L 4.1 5.2 4.0  Chloride 96 - 106 mmol/L 104 99 103  CO2 20 - 29 mmol/L 24 24 23   Calcium  8.7 - 10.2 mg/dL 9.0 9.6 8.9    Hepatic Function Latest Ref Rng & Units 10/26/2016 10/26/2015 09/23/2014  Total Protein 6.0 - 8.5 g/dL 6.3 6.7 6.1  Albumin 3.5 - 5.5 g/dL 4.0 4.4 4.1  AST 0 - 40 IU/L 26 13 15   ALT 0 - 32 IU/L 33(H) 11 11  Alk Phosphatase 39 - 117 IU/L 107 118(H) 134(H)  Total Bilirubin 0.0 - 1.2 mg/dL 0.3 0.3 <0.2    CBC Latest Ref Rng & Units 10/26/2016 10/26/2015 09/23/2014  WBC 3.4 - 10.8 x10E3/uL 9.8 8.2 8.2  Hemoglobin 11.1 - 15.9 g/dL 16.6(H) 18.2(H) 16.4(H)  Hematocrit 34.0 - 46.6 % 50.0(H) 52.0(H) 46.5  Platelets 150 - 379 x10E3/uL 153 187 181    Lab Results  Component Value Date   MCV 95 10/26/2016   MCV 91 10/26/2015   MCV 88 09/23/2014    Lab Results  Component Value Date   TSH 0.676 08/26/2011     Lab Results  Component Value Date   MCV 95 10/26/2016   MCV 91 10/26/2015   MCV 88 09/23/2014   Lab Results  Component Value Date   TSH 0.676 08/26/2011  No results found for: HGBA1C   Lipid Panel  No results found for: CHOL, TRIG, HDL, CHOLHDL, VLDL, LDLCALC, LDLDIRECT  RADIOLOGY: No results found.  IMPRESSION:  1. Essential hypertension   2. Hyperlipidemia, unspecified hyperlipidemia type   3. Medication management   4. Tobacco abuse   5. Palpitations   6. Seizure disorder [facility])     ASSESSMENT AND PLAN: Wendy Lopez is a 56 year old female who has a long-standing tobacco history of greater than 42 years.  In January 2009  cardiac catheterization at the [facility] revealed normal coronary arteries. A nuclear perfusion study in 2012 showed normal perfusion. A cardiopulmonary met test in July 2013 showed blunted chronotropic response without ischemic changes but Wendy Lopez had reduced functional status with peak O2 at 69% of predicted. Wendy Lopez has mild pulmonary hypertension.  An echo revealed normal function without wall  motion abnormalities despite her ECG changes.  Her last stress test which was done because of more abnormal ST-T  changes. A repeat echo Doppler study in November 2016, which essentially was unchanged and showed an EF of 60-65%.  There was trivial TR and MR.  Pulmonary pressures were normal. Presently, her blood pressure is stable without orthostatic symptoms.  Wendy Lopez is now on Toprol-XL 259m daily.  ECG reveals normal sinus rhythm at 69 bpm.  I discussed the importance of complete smoking cessation.  We discussed exercise, preferably 5 days per week for at least 30 minutes if at all possible.  Wendy Lopez's not had any seizure activity and continues to be on Dilantin and Keppra.  Wendy Lopez has not had recent laboratory and this will be done in the fasting state.  Early this year.  His CBC did reveal slight increase in hemoglobin which may be contributed by her tobacco use.  I will contact her regarding her blood work and adjustments to medical therapy will be made at that time.   Time spent: 25 minutes  TTroy Sine MD, FMedical [facility] 02/03/2017 1:13 PM

## 2017-02-01 NOTE — Patient Instructions (Signed)
Medication Instructions:  Your physician recommends that you continue on your current medications as directed. Please refer to the Current Medication list given to you today.  Labwork: Please return for FASTING labs (CMET, CBC, Lipid, TSH)  Our in office lab hours are Monday-Friday 8:00-4:30, closed for lunch 1-2 pm.  No appointment needed.  Follow-Up: Your physician wants you to follow-up in: 12 months with Dr. Claiborne Billings.  You will receive a reminder letter in the mail two months in advance. If you don't receive a letter, please call our office to schedule the follow-up appointment.   Any Other Special Instructions Will Be Listed Below (If Applicable).     If you need a refill on your cardiac medications before your next appointment, please call your pharmacy.

## 2017-02-17 DIAGNOSIS — I10 Essential (primary) hypertension: Secondary | ICD-10-CM | POA: Diagnosis not present

## 2017-02-17 DIAGNOSIS — E785 Hyperlipidemia, unspecified: Secondary | ICD-10-CM | POA: Diagnosis not present

## 2017-02-17 DIAGNOSIS — Z79899 Other long term (current) drug therapy: Secondary | ICD-10-CM | POA: Diagnosis not present

## 2017-02-17 LAB — COMPREHENSIVE METABOLIC PANEL
ALT: 14 IU/L (ref 0–32)
AST: 16 IU/L (ref 0–40)
Albumin/Globulin Ratio: 2.4 — ABNORMAL HIGH (ref 1.2–2.2)
Albumin: 4.4 g/dL (ref 3.5–5.5)
Alkaline Phosphatase: 114 IU/L (ref 39–117)
BUN/Creatinine Ratio: 13 (ref 9–23)
BUN: 10 mg/dL (ref 6–24)
Bilirubin Total: 0.3 mg/dL (ref 0.0–1.2)
CO2: 25 mmol/L (ref 20–29)
Calcium: 9.2 mg/dL (ref 8.7–10.2)
Chloride: 105 mmol/L (ref 96–106)
Creatinine, Ser: 0.79 mg/dL (ref 0.57–1.00)
GFR calc Af Amer: 96 mL/min/{1.73_m2} (ref >59)
GFR calc non Af Amer: 83 mL/min/{1.73_m2} (ref >59)
Globulin, Total: 1.8 g/dL (ref 1.5–4.5)
Glucose: 94 mg/dL (ref 65–99)
Potassium: 4.5 mmol/L (ref 3.5–5.2)
Sodium: 145 mmol/L — ABNORMAL HIGH (ref 134–144)
Total Protein: 6.2 g/dL (ref 6.0–8.5)

## 2017-02-17 LAB — TSH: TSH: 1.61 u[IU]/mL (ref 0.450–4.500)

## 2017-02-17 LAB — CBC
Hematocrit: 48.8 % — ABNORMAL HIGH (ref 34.0–46.6)
Hemoglobin: 17.7 g/dL — ABNORMAL HIGH (ref 11.1–15.9)
MCH: 33.1 pg — ABNORMAL HIGH (ref 26.6–33.0)
MCHC: 36.3 g/dL — ABNORMAL HIGH (ref 31.5–35.7)
MCV: 91 fL (ref 79–97)
Platelets: 178 10*3/uL (ref 150–379)
RBC: 5.35 x10E6/uL — ABNORMAL HIGH (ref 3.77–5.28)
RDW: 12.4 % (ref 12.3–15.4)
WBC: 7.3 10*3/uL (ref 3.4–10.8)

## 2017-02-17 LAB — LIPID PANEL
Chol/HDL Ratio: 2.4 ratio (ref 0.0–4.4)
Cholesterol, Total: 170 mg/dL (ref 100–199)
HDL: 70 mg/dL (ref >39)
LDL Calculated: 87 mg/dL (ref 0–99)
Triglycerides: 65 mg/dL (ref 0–149)
VLDL Cholesterol Cal: 13 mg/dL (ref 5–40)

## 2017-02-20 ENCOUNTER — Other Ambulatory Visit: Payer: Self-pay | Admitting: Cardiovascular Disease

## 2017-03-24 DIAGNOSIS — M48061 Spinal stenosis, lumbar region without neurogenic claudication: Secondary | ICD-10-CM | POA: Diagnosis not present

## 2017-03-24 DIAGNOSIS — M5136 Other intervertebral disc degeneration, lumbar region: Secondary | ICD-10-CM | POA: Diagnosis not present

## 2017-03-24 DIAGNOSIS — M5126 Other intervertebral disc displacement, lumbar region: Secondary | ICD-10-CM | POA: Diagnosis not present

## 2017-03-24 DIAGNOSIS — M47816 Spondylosis without myelopathy or radiculopathy, lumbar region: Secondary | ICD-10-CM | POA: Diagnosis not present

## 2017-03-24 DIAGNOSIS — Z981 Arthrodesis status: Secondary | ICD-10-CM | POA: Diagnosis not present

## 2017-03-24 DIAGNOSIS — M810 Age-related osteoporosis without current pathological fracture: Secondary | ICD-10-CM | POA: Diagnosis not present

## 2017-04-06 ENCOUNTER — Ambulatory Visit (HOSPITAL_COMMUNITY): Payer: Medicare Other | Admitting: Adult Health

## 2017-05-21 ENCOUNTER — Other Ambulatory Visit: Payer: Self-pay | Admitting: Cardiovascular Disease

## 2017-05-22 NOTE — Telephone Encounter (Signed)
Rx request sent to pharmacy.  

## 2017-07-14 DIAGNOSIS — M47816 Spondylosis without myelopathy or radiculopathy, lumbar region: Secondary | ICD-10-CM | POA: Diagnosis not present

## 2017-07-31 DIAGNOSIS — M47816 Spondylosis without myelopathy or radiculopathy, lumbar region: Secondary | ICD-10-CM | POA: Diagnosis not present

## 2017-07-31 DIAGNOSIS — Z981 Arthrodesis status: Secondary | ICD-10-CM | POA: Diagnosis not present

## 2017-07-31 DIAGNOSIS — M5125 Other intervertebral disc displacement, thoracolumbar region: Secondary | ICD-10-CM | POA: Diagnosis not present

## 2017-08-03 DIAGNOSIS — M47816 Spondylosis without myelopathy or radiculopathy, lumbar region: Secondary | ICD-10-CM | POA: Diagnosis not present

## 2017-10-12 ENCOUNTER — Other Ambulatory Visit: Payer: Self-pay | Admitting: Nurse Practitioner

## 2017-10-17 DIAGNOSIS — Z853 Personal history of malignant neoplasm of breast: Secondary | ICD-10-CM | POA: Diagnosis not present

## 2017-10-17 DIAGNOSIS — M8589 Other specified disorders of bone density and structure, multiple sites: Secondary | ICD-10-CM | POA: Diagnosis not present

## 2017-10-17 DIAGNOSIS — Z1231 Encounter for screening mammogram for malignant neoplasm of breast: Secondary | ICD-10-CM | POA: Diagnosis not present

## 2017-10-24 ENCOUNTER — Inpatient Hospital Stay (HOSPITAL_COMMUNITY): Payer: Medicare Other | Attending: Internal Medicine | Admitting: Internal Medicine

## 2017-10-24 ENCOUNTER — Encounter (HOSPITAL_COMMUNITY): Payer: Self-pay | Admitting: Internal Medicine

## 2017-10-24 ENCOUNTER — Inpatient Hospital Stay (HOSPITAL_COMMUNITY): Payer: Medicare Other

## 2017-10-24 VITALS — BP 127/75 | HR 72 | Temp 98.5°F | Resp 16 | Wt 135.2 lb

## 2017-10-24 DIAGNOSIS — Z9011 Acquired absence of right breast and nipple: Secondary | ICD-10-CM | POA: Insufficient documentation

## 2017-10-24 DIAGNOSIS — Z853 Personal history of malignant neoplasm of breast: Secondary | ICD-10-CM

## 2017-10-24 DIAGNOSIS — I10 Essential (primary) hypertension: Secondary | ICD-10-CM | POA: Diagnosis not present

## 2017-10-24 DIAGNOSIS — M25559 Pain in unspecified hip: Secondary | ICD-10-CM

## 2017-10-24 DIAGNOSIS — M898X9 Other specified disorders of bone, unspecified site: Secondary | ICD-10-CM | POA: Insufficient documentation

## 2017-10-24 DIAGNOSIS — Z170 Estrogen receptor positive status [ER+]: Secondary | ICD-10-CM

## 2017-10-24 DIAGNOSIS — D582 Other hemoglobinopathies: Secondary | ICD-10-CM | POA: Insufficient documentation

## 2017-10-24 DIAGNOSIS — R5383 Other fatigue: Secondary | ICD-10-CM

## 2017-10-24 DIAGNOSIS — C50111 Malignant neoplasm of central portion of right female breast: Secondary | ICD-10-CM

## 2017-10-24 DIAGNOSIS — F17210 Nicotine dependence, cigarettes, uncomplicated: Secondary | ICD-10-CM | POA: Diagnosis not present

## 2017-10-24 DIAGNOSIS — Z9221 Personal history of antineoplastic chemotherapy: Secondary | ICD-10-CM | POA: Diagnosis not present

## 2017-10-24 LAB — CBC WITH DIFFERENTIAL/PLATELET
Basophils Absolute: 0 10*3/uL (ref 0.0–0.1)
Basophils Relative: 0 %
Eosinophils Absolute: 0.1 10*3/uL (ref 0.0–0.7)
Eosinophils Relative: 1 %
HCT: 49.4 % — ABNORMAL HIGH (ref 36.0–46.0)
Hemoglobin: 17.4 g/dL — ABNORMAL HIGH (ref 12.0–15.0)
Lymphocytes Relative: 38 %
Lymphs Abs: 3.8 10*3/uL (ref 0.7–4.0)
MCH: 31.2 pg (ref 26.0–34.0)
MCHC: 35.2 g/dL (ref 30.0–36.0)
MCV: 88.5 fL (ref 78.0–100.0)
Monocytes Absolute: 0.9 10*3/uL (ref 0.1–1.0)
Monocytes Relative: 9 %
Neutro Abs: 5 10*3/uL (ref 1.7–7.7)
Neutrophils Relative %: 52 %
Platelets: 158 10*3/uL (ref 150–400)
RBC: 5.58 MIL/uL — ABNORMAL HIGH (ref 3.87–5.11)
RDW: 12.6 % (ref 11.5–15.5)
WBC: 9.9 10*3/uL (ref 4.0–10.5)

## 2017-10-24 LAB — COMPREHENSIVE METABOLIC PANEL
ALT: 16 U/L (ref 0–44)
AST: 17 U/L (ref 15–41)
Albumin: 4.3 g/dL (ref 3.5–5.0)
Alkaline Phosphatase: 117 U/L (ref 38–126)
Anion gap: 9 (ref 5–15)
BUN: 10 mg/dL (ref 6–20)
CO2: 27 mmol/L (ref 22–32)
Calcium: 9.2 mg/dL (ref 8.9–10.3)
Chloride: 107 mmol/L (ref 98–111)
Creatinine, Ser: 0.73 mg/dL (ref 0.44–1.00)
GFR calc Af Amer: >60 mL/min (ref >60)
GFR calc non Af Amer: >60 mL/min (ref >60)
Glucose, Bld: 98 mg/dL (ref 70–99)
Potassium: 4.1 mmol/L (ref 3.5–5.1)
Sodium: 143 mmol/L (ref 135–145)
Total Bilirubin: 0.5 mg/dL (ref 0.3–1.2)
Total Protein: 7.2 g/dL (ref 6.5–8.1)

## 2017-10-24 LAB — LACTATE DEHYDROGENASE: LDH: 141 U/L (ref 98–192)

## 2017-10-24 LAB — FERRITIN: Ferritin: 47 ng/mL (ref 11–307)

## 2017-10-24 NOTE — Progress Notes (Signed)
Diagnosis Malignant neoplasm of central portion of right breast in female, estrogen receptor positive ([place]) - Plan: CBC with Differential/Platelet, Comprehensive metabolic panel, Lactate dehydrogenase, CBC with Differential/Platelet, Ambulatory Referral for Lung Cancer Screening, Ferritin, CANCELED: Comprehensive metabolic panel, CANCELED: Lactate dehydrogenase  Other fatigue - Plan: Ferritin  Staging Cancer Staging Cancer of right breast [facility]) Staging form: Breast, AJCC 7th Edition - Clinical stage from 04/06/2015: Stage IA (T1c, N0, M0) - Signed by Baird Cancer, PA-C on 04/06/2015   Assessment and plan:  1.  Stage IA right breast cancer.  Pt is seen today for follow-up and is here to go over recent mammogram.  She reports a palpable area in the left breast.  On exam, no palpable abnormalities noted.  She had recent left screening mammogram done at [facility] on 10/17/2017 reviewed that was negative.  Pt reports she did not notify mammogram center of her left breast concerns.  Will discuss with Radiology and set up for diagnostic mammogram and/or USN.  Pending their review, she is set up for follow-up in 1 year as she is is more than 9 years out from diagnosis.  Labs done today reviewed WNL  2.  Left breast concerns. On exam, no palpable abnormalities noted.  She had recent left screening mammogram done at [facility] on 10/17/2017 that was negative.  Pt reports she did not notify mammogram center of her left breast concerns.  Will discuss with Radiology and determine if pt will need to be set up for diagnostic mammogram and USN.  Pending their review, she is set up for follow-up in 1 year as she is is more than 9 years out from diagnosis.    3.  Bone pain.  She will be set up for bone scan and will be notified of results.    4.  Smoking.  Cessation is recommended.  She is referred for lung cancer screening.  HCT stable at 48 and likely related to smoking history.    5.  HTN.  BP is 127/75.  Follow-up  with PCP.    Greater than 35 minutes spent with counseling and coordination of care.    Interval History: Historical data obtained from note dated 04/05/2016:  57 yr old female with  Stage IA right breast invasive ductal carcinoma, ER+/PR+/HER2-, diagnosed in 12/2008; underwent right mastectomy with sentinel lymph node biopsy demonstrating 1.7 cm invasive disease, 0/3 sentinel lymph nodes. Received 3 cycles of Taxotere/Cytoxan chemotherapy.  She was unable to tolerate anti-estrogen therapy and now remains on surveillance alone.  She presents to [facility] for continued follow-up and surveillance.   Current Status:  Pt is seen today for follow-up.  She reports she has some concerns regarding a palpable area in the left breast.  She also reports some joint discomfort.      Cancer of right breast ([place])   01/13/2009 Initial Diagnosis    Needle core biopsy of 12 o'clock mass in right breast and axillary lymph node demonstrating invasive mammary carcinoma with negative lymph node.  ER 99%, PR 54%, Ki-67 15%, and Her2  negative.      02/13/2009 Definitive Surgery    Right simple mastectomy by Dr. Dalbert Batman demonstrating a 1.7 cm invasive ductal carcinoma, grade 1, no LVI, clear margins and 0/3 sentinel lymph nodes.       Oncotype testing    Intermediate-risk; was offered chemotherapy        Adjuvant Chemotherapy    Taxotere/Cytoxan x 3 cycles completed (unable to complete final  cycle due to intolerance; patient weight was reportedly not dose-adjusted).        Anti-estrogen oral therapy    Started on Anastrozole; was only able to tolerate about 6 months of therapy before it was discontinued d/t contractures of her hands requiring surgical intervention.  No subsequent anti-estrogen therapy was given.        Procedure    Per patient, she had genetic testing in the past; results were negative per her report.         Problem List Patient Active Problem List   Diagnosis Date Noted   . Therapeutic drug monitoring [Z51.81] 10/25/2016  . Seizure disorder ([place]) [T01.779] 09/20/2015  . Palpitations [R00.2] 02/28/2015  . Chest pain [R07.9] 04/21/2014  . Abnormal ECG [R94.31] 03/31/2014  . 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 Memory deficit [R41.3] 02/07/2013  . HTN (hypertension) [I10] 11/25/2012  . Lumbar disc disease [M51.9] 11/25/2012  . Tobacco abuse [Z72.0] 11/25/2012  . Cancer of right breast [facility]) [C50.911] 04/16/2012    Past Medical History Past Medical History:  Diagnosis Date  . Anxiety and depression   . Arthritis   . Breast cancer ([place]) 02/13/2009  . Cancer of right breast ([place]) 04/16/2012  . Depression   . Depression 2014  . Dysrhythmia    palpitations  . Epilepsy ([place])    last seizure 1990's  . H/O hiatal hernia   . Localization-related (focal) (partial) epilepsy and epileptic syndromes with complex partial seizures, without mention of intractable epilepsy 02/07/2013  . Memory deficit 02/07/2013  . Memory loss   . Mitral valve prolapse 06/01/10   echo- EF>55% mild tricupsid regurgitation. There is mild Pulmonary Hypertension.. Right  ventricular systolic pressure iselevated at 30-40 mmHg. LV systolic function is normal.  . Osteopenia   . PONV (postoperative nausea and vomiting)   . SOB (shortness of breath) 10/18/11   met-test  . 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 BACK SURGERY     screws- lumbar  . BREAST SURGERY  2010   mastectomy - RIGHT  . COLONOSCOPY WITH PROPOFOL  03/13/2012   Procedure: COLONOSCOPY WITH PROPOFOL;  Surgeon: Garlan Fair, MD;  Location: [facility];  Service: Endoscopy;  Laterality: N/A;  . ESOPHAGOGASTRODUODENOSCOPY  03/13/2012   Procedure: ESOPHAGOGASTRODUODENOSCOPY (EGD);  Surgeon: Garlan Fair, MD;  Location: [facility];  Service: Endoscopy;  Laterality: N/A;  Reflux, Hiatal Hernia   . HAND TENDON SURGERY  2012   tendons released in right hand   . MASTECTOMY, RADICAL  02/13/2009  . [place], 2003, 2005  . TONSILLECTOMY      Family History Family History  Problem Relation Age of Onset  . Arthritis Mother        rhuematoid  . Heart disease Mother        Atrial fib  . Heart disease Father   . Heart disease Sister        Atrial fib     Social History  reports that she has been smoking cigarettes.  She has been smoking about 0.50 packs per day. She has never used smokeless tobacco. She reports that she does not drink alcohol or use drugs.  Medications  Current Outpatient Medications:  .  aspirin-acetaminophen-caffeine (EXCEDRIN EXTRA STRENGTH) [phone] MG per tablet, Take 1 tablet by mouth every 6 (six) hours as needed. Pain, Disp: , Rfl:  .  cetirizine (ZYRTEC) 10 MG tablet, Take 10 mg by mouth daily., Disp: , Rfl:  .  cyanocobalamin 100 MCG tablet, Take 100 mcg by mouth daily., Disp: , Rfl:  .  DILANTIN 100 MG ER capsule, TAKE ONE CAPSULE BY MOUTH THREE TIMES DAILY, Disp: 90 capsule, Rfl: 0 .  HYDROcodone-acetaminophen (NORCO/VICODIN) 5-325 MG per tablet, Take 1 tablet by mouth every 6 (six) hours as needed for pain. Reported on 04/07/2015, Disp: , Rfl:  .  levETIRAcetam (KEPPRA) 500 MG tablet, 1 in the am and 2 at hs, Disp: 90 tablet, Rfl: 11 .  metoprolol succinate (TOPROL-XL) 100 MG 24 hr tablet, TAKE 1 TABLET BY MOUTH TWICE DAILY( TAKE WITH OR IMMEDIATELY FOLLOWING A MEAL), Disp: 180 tablet, Rfl: 1 .  Misc. Devices MISC, Please provide patient with right breast prosthesis and 6 mastectomy bras sue to a history of right breast cancer and mastectomy. (ICD10- C50.111), Disp: 6 each, Rfl: 0  Allergies Carbamazepine; Lamictal [lamotrigine]; Other; Pseudoephedrine hcl er; Valium; Amlodipine; Oxycodone; Prednisone; Ibuprofen; Percocet [oxycodone-acetaminophen]; Betadine [povidone iodine]; Latex; and Shellfish allergy  Review of Systems Review of  Systems - Oncology ROS negative other than joint pain and left breast lesion   Physical Exam  Vitals Wt Readings from Last 3 Encounters:  10/24/17 135 lb 3.2 oz (61.3 kg)  02/01/17 138 lb 12.8 oz (63 kg)  10/25/16 138 lb 12.8 oz (63 kg)   Temp Readings from Last 3 Encounters:  10/24/17 98.5 F (36.9 C) (Oral)  04/05/16 98.8 F (37.1 C) (Oral)  04/07/15 98.2 F (36.8 C) (Oral)   BP Readings from Last 3 Encounters:  10/24/17 127/75  02/01/17 116/77  10/25/16 118/80   Pulse Readings from Last 3 Encounters:  10/24/17 72  02/01/17 69  10/25/16 72   Constitutional: Well-developed, well-nourished, and in no distress.   HENT: Head: Normocephalic and atraumatic.  Mouth/Throat: No oropharyngeal exudate. Mucosa moist. Eyes: Pupils are equal, round, and reactive to light. Conjunctivae are normal. No scleral icterus.  Neck: Normal range of motion. Neck supple. No JVD present.  Cardiovascular: Normal rate, regular rhythm and normal heart sounds.  Exam reveals no gallop and no friction rub.   No murmur heard. Pulmonary/Chest: Effort normal and breath sounds normal. No respiratory distress. No wheezes.No rales.  Abdominal: Soft. Bowel sounds are normal. No distension. There is no tenderness. There is no guarding.  Musculoskeletal: No edema or tenderness.  Lymphadenopathy: No cervical, axillary or supraclavicular adenopathy.  Neurological: Alert and oriented to person, place, and time. No cranial nerve deficit.  Skin: Skin is warm and dry. No rash noted. No erythema. No pallor.  Psychiatric: Affect and judgment normal.  Breast exam:  Chaperone present.  Right mastectomy site healed well.  No palpable abnormalities noted.  Left breast shows no palpable dominant masses.    Labs Appointment on 10/24/2017  Component Date Value Ref Range Status  . WBC 10/24/2017 9.9  4.0 - 10.5 K/uL Final  . RBC 10/24/2017 5.58* 3.87 - 5.11 MIL/uL Final  . Hemoglobin 10/24/2017 17.4* 12.0 - 15.0 g/dL  Final  . HCT 10/24/2017 49.4* 36.0 - 46.0 % Final  . MCV 10/24/2017 88.5  78.0 - 100.0 fL Final  . MCH 10/24/2017 31.2  26.0 - 34.0 pg Final  . MCHC 10/24/2017 35.2  30.0 - 36.0 g/dL Final  . RDW 10/24/2017 12.6  11.5 - 15.5 % Final  . Platelets 10/24/2017 158  150 - 400 K/uL Final  . Neutrophils Relative % 10/24/2017 52  % Final  .  Neutro Abs 10/24/2017 5.0  1.7 - 7.7 K/uL Final  . Lymphocytes Relative 10/24/2017 38  % Final  . Lymphs Abs 10/24/2017 3.8  0.7 - 4.0 K/uL Final  . Monocytes Relative 10/24/2017 9  % Final  . Monocytes Absolute 10/24/2017 0.9  0.1 - 1.0 K/uL Final  . Eosinophils Relative 10/24/2017 1  % Final  . Eosinophils Absolute 10/24/2017 0.1  0.0 - 0.7 K/uL Final  . Basophils Relative 10/24/2017 0  % Final  . Basophils Absolute 10/24/2017 0.0  0.0 - 0.1 K/uL Final   Performed at [facility], [address], [place], [place] [address]  . Sodium 10/24/2017 143  135 - 145 mmol/L Final  . Potassium 10/24/2017 4.1  3.5 - 5.1 mmol/L Final  . Chloride 10/24/2017 107  98 - 111 mmol/L Final   Please note change in reference range.  . CO2 10/24/2017 27  22 - 32 mmol/L Final  . Glucose, Bld 10/24/2017 98  70 - 99 mg/dL Final   Please note change in reference range.  . BUN 10/24/2017 10  6 - 20 mg/dL Final   Please note change in reference range.  . Creatinine, Ser 10/24/2017 0.73  0.44 - 1.00 mg/dL Final  . Calcium 10/24/2017 9.2  8.9 - 10.3 mg/dL Final  . Total Protein 10/24/2017 7.2  6.5 - 8.1 g/dL Final  . Albumin 10/24/2017 4.3  3.5 - 5.0 g/dL Final  . AST 10/24/2017 17  15 - 41 U/L Final  . ALT 10/24/2017 16  0 - 44 U/L Final   Please note change in reference range.  . Alkaline Phosphatase 10/24/2017 117  38 - 126 U/L Final  . Total Bilirubin 10/24/2017 0.5  0.3 - 1.2 mg/dL Final  . GFR calc non Af Amer 10/24/2017 >60  >60 mL/min Final  . GFR calc Af Amer 10/24/2017 >60  >60 mL/min Final   Comment: (NOTE) The eGFR has been calculated using the CKD EPI  equation. This calculation has not been validated in all clinical situations. eGFR's persistently <60 mL/min signify possible Chronic Kidney Disease.   Georgiann Hahn gap 10/24/2017 9  5 - 15 Final   Performed at [facility], [address], [place], [place] [address]  . LDH 10/24/2017 141  98 - 192 U/L Final   Performed at [facility], [address] [place], [place] [address]     Pathology Orders Placed This Encounter  Procedures  . CBC with Differential/Platelet    Standing Status:   Future    Number of Occurrences:   1    Standing Expiration Date:   10/25/2018  . Comprehensive metabolic panel    Standing Status:   Future    Number of Occurrences:   1    Standing Expiration Date:   10/25/2018  . Lactate dehydrogenase    Standing Status:   Future    Number of Occurrences:   1    Standing Expiration Date:   10/25/2018  . CBC with Differential/Platelet    Standing Status:   Future    Number of Occurrences:   1    Standing Expiration Date:   10/25/2019  . Ferritin    Standing Status:   Future    Number of Occurrences:   1    Standing Expiration Date:   10/25/2018  . Ambulatory Referral for Lung Cancer Screening    Referral Priority:   Routine    Referral Type:   Consultation    Referral Reason:  Specialty Services Required    Number of Visits Requested:   [place] MD

## 2017-10-24 NOTE — Patient Instructions (Signed)
[facility] at [facility] Discharge Instructions  You saw Dr. Higgs today.   Thank you for choosing [facility] at [facility] to provide your oncology and hematology care.  To afford each patient quality time with our provider, please arrive at least 15 minutes before your scheduled appointment time.   If you have a lab appointment with the Cancer Center please come in thru the  Main Entrance and check in at the main information desk  You need to re-schedule your appointment should you arrive 10 or more minutes late.  We strive to give you quality time with our providers, and arriving late affects you and other patients whose appointments are after yours.  Also, if you no show three or more times for appointments you may be dismissed from the clinic at the providers discretion.     Again, thank you for choosing [facility].  Our hope is that these requests will decrease the amount of time that you wait before being seen by our physicians.       _____________________________________________________________  Should you have questions after your visit to [facility], please contact our office at [phone] between the hours of 8:30 a.m. and 4:30 p.m.  Voicemails left after 4:30 p.m. will not be returned until the following business day.  For prescription refill requests, have your pharmacy contact our office.       Resources For Cancer Patients and their Caregivers ? American Cancer Society: Can assist with transportation, wigs, general needs, runs Look Good Feel Better.        1-888-227-6333 ? Cancer Care: Provides financial assistance, online support groups, medication/co-pay assistance.  1-800-813-HOPE (4673) ? [facility] Assists [place] cancer patients and their families through emotional , educational and financial support.  336-427-4357 ? [place] DSS Where to apply for food  stamps, Medicaid and utility assistance. 336-342-1394 ? [facility]: Transportation to medical appointments. 336-347-2287 ? Social Security Administration: May apply for disability if have a Stage IV cancer. 336-342-7796 1-800-772-1213 ? [place] Aging, Disability and Transit Services: Assists with nutrition, care and transit needs. 336-349-2343  Cancer Center Support Programs:   > Cancer Support Group  2nd Tuesday of the month 1pm-2pm, Journey Room   > Creative Journey  3rd Tuesday of the month 1130am-1pm, Journey Room     

## 2017-10-26 ENCOUNTER — Ambulatory Visit: Payer: Medicare Other | Admitting: Nurse Practitioner

## 2017-10-26 ENCOUNTER — Ambulatory Visit: Payer: Medicare Other | Admitting: Neurology

## 2017-10-26 ENCOUNTER — Other Ambulatory Visit: Payer: Self-pay | Admitting: Acute Care

## 2017-10-26 DIAGNOSIS — Z122 Encounter for screening for malignant neoplasm of respiratory organs: Secondary | ICD-10-CM

## 2017-10-26 DIAGNOSIS — F17210 Nicotine dependence, cigarettes, uncomplicated: Secondary | ICD-10-CM

## 2017-10-27 ENCOUNTER — Other Ambulatory Visit (HOSPITAL_COMMUNITY): Payer: Self-pay | Admitting: Internal Medicine

## 2017-10-31 ENCOUNTER — Encounter (HOSPITAL_COMMUNITY): Payer: Medicare Other

## 2017-10-31 ENCOUNTER — Encounter (HOSPITAL_COMMUNITY)
Admission: RE | Admit: 2017-10-31 | Discharge: 2017-10-31 | Disposition: A | Discharge status: Home / Self Care | Payer: Medicare Other | Source: Ambulatory Visit | Attending: Internal Medicine | Admitting: Internal Medicine

## 2017-10-31 ENCOUNTER — Encounter (HOSPITAL_COMMUNITY): Payer: Self-pay

## 2017-10-31 DIAGNOSIS — Z170 Estrogen receptor positive status [ER+]: Secondary | ICD-10-CM | POA: Insufficient documentation

## 2017-10-31 DIAGNOSIS — C50919 Malignant neoplasm of unspecified site of unspecified female breast: Secondary | ICD-10-CM | POA: Diagnosis not present

## 2017-10-31 DIAGNOSIS — M25559 Pain in unspecified hip: Secondary | ICD-10-CM | POA: Diagnosis not present

## 2017-10-31 DIAGNOSIS — C50111 Malignant neoplasm of central portion of right female breast: Secondary | ICD-10-CM | POA: Diagnosis not present

## 2017-10-31 MED ORDER — TECHNETIUM TC 99M MEDRONATE IV KIT
20.0000 | PACK | Freq: Once | INTRAVENOUS | Status: AC | PRN
  Administered 2017-10-31: 21 via INTRAVENOUS

## 2017-11-03 ENCOUNTER — Inpatient Hospital Stay: Admission: RE | Admit: 2017-11-03 | Payer: Medicare Other | Source: Ambulatory Visit

## 2017-11-03 ENCOUNTER — Telehealth: Payer: Self-pay | Admitting: Acute Care

## 2017-11-03 ENCOUNTER — Encounter: Payer: Medicare Other | Admitting: Acute Care

## 2017-11-06 ENCOUNTER — Other Ambulatory Visit (HOSPITAL_COMMUNITY): Payer: Self-pay | Admitting: Internal Medicine

## 2017-11-06 DIAGNOSIS — Z853 Personal history of malignant neoplasm of breast: Secondary | ICD-10-CM

## 2017-11-07 ENCOUNTER — Encounter (HOSPITAL_COMMUNITY): Payer: Medicare Other

## 2017-11-07 ENCOUNTER — Other Ambulatory Visit: Payer: Self-pay | Admitting: Nurse Practitioner

## 2017-11-07 DIAGNOSIS — M47812 Spondylosis without myelopathy or radiculopathy, cervical region: Secondary | ICD-10-CM | POA: Diagnosis not present

## 2017-11-07 DIAGNOSIS — M47816 Spondylosis without myelopathy or radiculopathy, lumbar region: Secondary | ICD-10-CM | POA: Diagnosis not present

## 2017-11-07 NOTE — Telephone Encounter (Signed)
Spoke with pt and rescheduled [facility] 11/13/17 2:30\ CT will be rescheduled Nothing further needed

## 2017-11-07 NOTE — Telephone Encounter (Signed)
Called and spoke with pt needing to r/s lung screening appt with [facility] Transferred call to Clarita Leber today at Ext [address] will f/u with pt.

## 2017-11-11 ENCOUNTER — Other Ambulatory Visit: Payer: Self-pay | Admitting: Neurology

## 2017-11-13 ENCOUNTER — Inpatient Hospital Stay: Admission: RE | Admit: 2017-11-13 | Payer: Medicare Other | Source: Ambulatory Visit

## 2017-11-13 ENCOUNTER — Encounter: Payer: Medicare Other | Admitting: Acute Care

## 2017-11-14 ENCOUNTER — Ambulatory Visit (HOSPITAL_COMMUNITY)
Admission: RE | Admit: 2017-11-14 | Discharge: 2017-11-14 | Disposition: A | Discharge status: Home / Self Care | Payer: Medicare Other | Source: Ambulatory Visit | Attending: Internal Medicine | Admitting: Internal Medicine

## 2017-11-14 DIAGNOSIS — Z853 Personal history of malignant neoplasm of breast: Secondary | ICD-10-CM | POA: Insufficient documentation

## 2017-11-14 DIAGNOSIS — R922 Inconclusive mammogram: Secondary | ICD-10-CM | POA: Diagnosis not present

## 2017-11-14 DIAGNOSIS — C50111 Malignant neoplasm of central portion of right female breast: Secondary | ICD-10-CM | POA: Diagnosis not present

## 2017-11-14 DIAGNOSIS — M25559 Pain in unspecified hip: Secondary | ICD-10-CM | POA: Insufficient documentation

## 2017-11-14 DIAGNOSIS — Z170 Estrogen receptor positive status [ER+]: Secondary | ICD-10-CM | POA: Diagnosis not present

## 2017-11-14 DIAGNOSIS — N6489 Other specified disorders of breast: Secondary | ICD-10-CM | POA: Diagnosis not present

## 2017-11-21 ENCOUNTER — Encounter: Payer: Self-pay | Admitting: Neurology

## 2017-11-21 ENCOUNTER — Ambulatory Visit (INDEPENDENT_AMBULATORY_CARE_PROVIDER_SITE_OTHER): Payer: Medicare Other | Admitting: Neurology

## 2017-11-21 VITALS — BP 128/70 | HR 77 | Ht 62.0 in | Wt 135.5 lb

## 2017-11-21 DIAGNOSIS — G40909 Epilepsy, unspecified, not intractable, without status epilepticus: Secondary | ICD-10-CM

## 2017-11-21 DIAGNOSIS — E538 Deficiency of other specified B group vitamins: Secondary | ICD-10-CM | POA: Diagnosis not present

## 2017-11-21 DIAGNOSIS — R202 Paresthesia of skin: Secondary | ICD-10-CM | POA: Diagnosis not present

## 2017-11-21 DIAGNOSIS — Z5181 Encounter for therapeutic drug level monitoring: Secondary | ICD-10-CM | POA: Diagnosis not present

## 2017-11-21 MED ORDER — DILANTIN 100 MG PO CAPS: 100.0000 mg | ORAL_CAPSULE | Freq: Three times a day (TID) | ORAL | 3 refills | Status: DC

## 2017-11-21 NOTE — Patient Instructions (Signed)
We will check EMG and NCV study to look at the nerve function of the arms and legs.

## 2017-11-21 NOTE — Progress Notes (Signed)
Reason for visit: Seizures, paresthesias  Wendy Lopez is an 57 y.o. female  History of present illness:  Wendy Lopez is a 57 year old right-handed white female with a history of seizures.  The patient is still having a brief split second unusual sensation that may occur once every 2 or 3 months.  Otherwise, she does not have any more prolonged episodes, no generalized seizures.  The patient is on Dilantin and Keppra, she is felt slightly depressed on the Keppra at times.  The patient has been on Dilantin for quite some time.  The patient did well on brand-name Lamictal, but she was allergic to the generic forms of this medication.  She could not afford the brand name Lamictal.  The patient has had a history of breast cancer, she received Taxol and had onset of numbness of all 4 extremities in 2010.  She believes that this numbness has gradually worsened over time, particular with the hands.  She does have some gait instability, she may have occasional falls.  She has had lumbosacral spine surgery done by Dr. Carloyn Manner in the past.  She is also followed for some neck issues.  She last had MRI of the brain in 2013 that was normal.  She returns to this office for an evaluation.  Past Medical History:  Diagnosis Date  . Anxiety and depression   . Arthritis   . Breast cancer ([place]) 02/13/2009  . Cancer of right breast ([place]) 04/16/2012  . Depression   . Depression 2014  . Dysrhythmia    palpitations  . Epilepsy ([place])    last seizure 1990's  . H/O hiatal hernia   . Localization-related (focal) (partial) epilepsy and epileptic syndromes with complex partial seizures, without mention of intractable epilepsy 02/07/2013  . Memory deficit 02/07/2013  . Memory loss   . Mitral valve prolapse 06/01/10   echo- EF>55% mild tricupsid regurgitation. There is mild Pulmonary Hypertension.. Right  ventricular systolic pressure iselevated at 30-40 mmHg. LV systolic function is normal.  . Osteopenia   . PONV  (postoperative nausea and vomiting)   . SOB (shortness of breath) 10/18/11   met-test  . 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 BACK SURGERY     screws- lumbar  . BREAST SURGERY  2010   mastectomy - RIGHT  . COLONOSCOPY WITH PROPOFOL  03/13/2012   Procedure: COLONOSCOPY WITH PROPOFOL;  Surgeon: Garlan Fair, MD;  [facility]: [facility];  Service: Endoscopy;  Laterality: N/A;  . ESOPHAGOGASTRODUODENOSCOPY  03/13/2012   Procedure: ESOPHAGOGASTRODUODENOSCOPY (EGD);  Surgeon: Garlan Fair, MD;  Location: [facility];  Service: Endoscopy;  Laterality: N/A;  Reflux, Hiatal Hernia  . HAND TENDON SURGERY  2012   tendons released in right hand   . MASTECTOMY, RADICAL  02/13/2009  . [place], 2003, 2005  . TONSILLECTOMY      Family History  Problem Relation Age of Onset  . Arthritis Mother        rhuematoid  . Heart disease Mother        Atrial fib  . Heart disease Father   . Heart disease Sister        Atrial fib    Social history:  reports that she has been smoking cigarettes.  She has been smoking about 0.50 packs per day. She has never used smokeless tobacco. She reports that she does not drink alcohol or use drugs.  Allergies  Allergen Reactions  . Carbamazepine Anaphylaxis  . Lamictal [Lamotrigine] Anaphylaxis    SOB, Lamotrigine only  . Other     STATES HAS SHORTNESS OF BREATH WITH GENERIC DRUGS     ALSO RASH AND ITCHING  . Pseudoephedrine Hcl Er Other (See Comments)    SEIZURES.  . Valium Other (See Comments)    Hallucinations, spasticity, hyper-relfexive   . Amlodipine Rash    Face swell  . Oxycodone Nausea And Vomiting and Rash  . Prednisone Rash and Other (See Comments)    Prescribing MD told patient he thinks dose (dose pack) was "just too high." 03/09/11   STATES HAS TAKEN SMALLER DOSE AND STILL HAD RASH AND JITTERINESS  . Ibuprofen Swelling  . Percocet [Oxycodone-Acetaminophen] Nausea  And Vomiting  . Betadine [Povidone Iodine] Rash  . Latex Dermatitis and Rash  . Shellfish Allergy Swelling    Medications:  Prior to Admission medications   Medication Sig Start Date End Date Taking? Authorizing Provider  aspirin-acetaminophen-caffeine (EXCEDRIN EXTRA STRENGTH) [phone] MG per tablet Take 1 tablet by mouth every 6 (six) hours as needed. Pain   Yes [provider]  cetirizine (ZYRTEC) 10 MG tablet Take 10 mg by mouth daily.   Yes [provider]  cyanocobalamin 100 MCG tablet Take 100 mcg by mouth daily.   Yes [provider]  DILANTIN 100 MG ER capsule Take 1 capsule (100 mg total) by mouth 3 (three) times daily. 11/21/17  Yes Kathrynn Ducking, MD  HYDROcodone-acetaminophen (NORCO/VICODIN) 5-325 MG per tablet Take 1 tablet by mouth every 6 (six) hours as needed for pain. Reported on 04/07/2015   Yes [provider]  levETIRAcetam (KEPPRA) 500 MG tablet TAKE 1 TABLET BY MOUTH EVERY MORNING AND 2 TABLETS BY MOUTH EVERY NIGHT AT BEDTIME 11/08/17  Yes Kathrynn Ducking, MD  metoprolol succinate (TOPROL-XL) 100 MG 24 hr tablet TAKE 1 TABLET BY MOUTH TWICE DAILY( TAKE WITH OR IMMEDIATELY FOLLOWING A MEAL) 05/22/17  Yes Troy Sine, MD  Misc. Devices MISC Please provide patient with right breast prosthesis and 6 mastectomy bras sue to a history of right breast cancer and mastectomy. (ICD10- C50.111) 08/09/16  Yes Holley Bouche, NP    ROS:  Out of a complete 14 system review of symptoms, the patient complains only of the following symptoms, and all other reviewed systems are negative.  Fatigue Ringing in the ears Blurred vision Shortness of breath, chest tightness Palpitations of the heart Diarrhea Restless legs Food allergies Joint pain, back pain, muscle cramps, walking difficulty, neck pain, neck stiffness Skin rash, moles, itching Dizziness, numbness, weakness, tremors  Blood pressure 128/70, pulse 77, height 5' 2"  (1.575 m),  weight 135 lb 8 oz (61.5 kg), last menstrual period 11/03/1993, SpO2 95 %.  Physical Exam  General: The patient is alert and cooperative at the time of the examination.  Skin: No significant peripheral edema is noted.   Neurologic Exam  Mental status: The patient is alert and oriented x 3 at the time of the examination. The patient has apparent normal recent and remote memory, with an apparently normal attention span and concentration ability.   Cranial nerves: Facial symmetry is present. Speech is normal, no aphasia or dysarthria is noted. Extraocular movements are full. Visual fields are full.  Motor: The patient has good strength in all 4 extremities.  Sensory examination: Soft touch sensation is symmetric on the face, arms, and legs.  Pinprick sensation symmetric throughout, no stocking pattern pinprick  sensory deficit is noted.  Vibration sensation is symmetric throughout.  Coordination: The patient has good finger-nose-finger and heel-to-shin bilaterally.  Gait and station: The patient has a normal gait. Tandem gait is normal. Romberg is negative. No drift is seen.  Reflexes: Deep tendon reflexes are symmetric, reflexes are normal throughout, well-maintained ankle jerk reflexes are seen bilaterally.   Assessment/Plan:  1.  History of seizures  2.  Numbness, all 4 extremities  The patient reports worsening sensory symptoms on all 4 extremities.  The original onset of numbness was associated with chemotherapy for breast cancer.  The patient will be set up for further blood work today, she will have nerve conduction studies on both arms and one leg, EMG on the right arm.  The patient will follow-up for the EMG, otherwise follow-up in 1 year.  A prescription was sent in for Dilantin.  Jill Alexanders MD 11/21/2017 10:22 AM  [facility] [address] [place], [place] [address]  Phone [phone] Fax [phone]

## 2017-11-23 LAB — VITAMIN B12: Vitamin B-12: 292 pg/mL (ref 232–1245)

## 2017-11-23 LAB — RPR: RPR Ser Ql: NONREACTIVE

## 2017-11-23 LAB — HIV ANTIBODY (ROUTINE TESTING W REFLEX): HIV Screen 4th Generation wRfx: NONREACTIVE

## 2017-11-23 LAB — B. BURGDORFI ANTIBODIES: Lyme IgG/IgM Ab: <0.91 {ISR} (ref 0.00–0.90)

## 2017-11-23 LAB — COPPER, SERUM: Copper: 138 ug/dL (ref 72–166)

## 2017-11-23 LAB — SEDIMENTATION RATE: Sed Rate: 2 mm/hr (ref 0–40)

## 2017-11-23 LAB — ANA W/REFLEX: Anti Nuclear Antibody(ANA): NEGATIVE

## 2017-11-23 LAB — LEVETIRACETAM LEVEL: Levetiracetam Lvl: 26.2 ug/mL (ref 10.0–40.0)

## 2017-11-23 LAB — PHENYTOIN LEVEL, TOTAL: Phenytoin (Dilantin), Serum: 10.9 ug/mL (ref 10.0–20.0)

## 2017-11-27 ENCOUNTER — Ambulatory Visit (INDEPENDENT_AMBULATORY_CARE_PROVIDER_SITE_OTHER): Payer: Medicare Other | Admitting: Acute Care

## 2017-11-27 ENCOUNTER — Encounter: Payer: Self-pay | Admitting: Acute Care

## 2017-11-27 ENCOUNTER — Ambulatory Visit (INDEPENDENT_AMBULATORY_CARE_PROVIDER_SITE_OTHER)
Admission: RE | Admit: 2017-11-27 | Discharge: 2017-11-27 | Disposition: A | Discharge status: Home / Self Care | Payer: Medicare Other | Source: Ambulatory Visit | Attending: Acute Care | Admitting: Acute Care

## 2017-11-27 DIAGNOSIS — F17210 Nicotine dependence, cigarettes, uncomplicated: Secondary | ICD-10-CM

## 2017-11-27 DIAGNOSIS — Z87891 Personal history of nicotine dependence: Secondary | ICD-10-CM | POA: Diagnosis not present

## 2017-11-27 DIAGNOSIS — Z122 Encounter for screening for malignant neoplasm of respiratory organs: Secondary | ICD-10-CM

## 2017-11-27 NOTE — Progress Notes (Signed)
Shared Decision Making Visit Lung Cancer Screening Program [phone])   Eligibility:  Age 57 y.o.  Pack Years Smoking History Calculation 43 pack year smoking history (# packs/per year x # years smoked)  Recent History of coughing up blood  no  Unexplained weight loss? no ( >Than 15 pounds within the last 6 months )  Prior History Lung / other cancer no (Diagnosis within the last 5 years already requiring surveillance chest CT Scans).  Smoking Status Current Smoker  Former Smokers: Years since quit: NA  Quit Date: NA  Visit Components:  Discussion included one or more decision making aids. yes  Discussion included risk/benefits of screening. yes  Discussion included potential follow up diagnostic testing for abnormal scans. yes  Discussion included meaning and risk of over diagnosis. yes  Discussion included meaning and risk of False Positives. yes  Discussion included meaning of total radiation exposure. yes  Counseling Included:  Importance of adherence to annual lung cancer LDCT screening. yes  Impact of comorbidities on ability to participate in the program. yes  Ability and willingness to under diagnostic treatment. yes  Smoking Cessation Counseling:  Current Smokers:   Discussed importance of smoking cessation. yes  Information about tobacco cessation classes and interventions provided to patient. yes  Patient provided with "ticket" for LDCT Scan. yes  Symptomatic Patient. no  Counseling  Diagnosis Code: Tobacco Use Z72.0  Asymptomatic Patient yes  Counseling (Intermediate counseling: > three minutes counseling) P2330  Former Smokers:   Discussed the importance of maintaining cigarette abstinence. yes  Diagnosis Code: Personal History of Nicotine Dependence. Q76.226  Information about tobacco cessation classes and interventions provided to patient. Yes  Patient provided with "ticket" for LDCT Scan. yes  Written Order for Lung Cancer  Screening with LDCT placed in Epic. Yes (CT Chest Lung Cancer Screening Low Dose W/O CM) [id number] Z12.2-Screening of respiratory organs Z87.891-Personal history of nicotine dependence  I have spent 25 minutes of face to face time with Wendy Lopez discussing the risks and benefits of lung cancer screening. We viewed a power point together that explained in detail the above noted topics. We paused at intervals to allow for questions to be asked and answered to ensure understanding.We discussed that the single most powerful action that she can take to decrease her risk of developing lung cancer is to quit smoking. We discussed whether or not she is ready to commit to setting a quit date. She is not currently ready to set a quit date. We discussed options for tools to aid in quitting smoking including nicotine replacement therapy, non-nicotine medications, support groups, Quit Smart classes, and behavior modification. We discussed that often times setting smaller, more achievable goals, such as eliminating 1 cigarette a day for a week and then 2 cigarettes a day for a week can be helpful in slowly decreasing the number of cigarettes smoked. This allows for a sense of accomplishment as well as providing a clinical benefit. I gave her the " Be Stronger Than Your Excuses" card with contact information for community resources, classes, free nicotine replacement therapy, and access to mobile apps, text messaging, and on-line smoking cessation help. I have also given her my card and contact information in the event she needs to contact me. We discussed the time and location of the scan, and that either Doroteo Glassman RN or I will call with the results within 24-48 hours of receiving them. I have offered her  a copy of the power point we viewed  as a resource in the event they need reinforcement of the concepts we discussed today in the office. The patient verbalized understanding of all of  the above and had no further  questions upon leaving the office. They have my contact information in the event they have any further questions.  I spent 4 minutes counseling on smoking cessation and the health risks of continued tobacco abuse.  I explained to the patient that there has been a high incidence of coronary artery disease noted on these exams. I explained that this is a non-gated exam therefore degree or severity cannot be determined. This patient is not on statin therapy. I have asked the patient to follow-up with their PCP regarding any incidental finding of coronary artery disease and management with diet or medication as their PCP  feels is clinically indicated. The patient verbalized understanding of the above and had no further questions upon completion of the visit.      Wendy Spatz, NP 11/27/2017 2:35 PM

## 2017-11-30 ENCOUNTER — Encounter: Payer: Self-pay | Admitting: Genetics

## 2017-12-01 ENCOUNTER — Other Ambulatory Visit: Payer: Self-pay | Admitting: Acute Care

## 2017-12-01 DIAGNOSIS — F17210 Nicotine dependence, cigarettes, uncomplicated: Secondary | ICD-10-CM

## 2017-12-01 DIAGNOSIS — Z122 Encounter for screening for malignant neoplasm of respiratory organs: Secondary | ICD-10-CM

## 2017-12-25 ENCOUNTER — Encounter: Payer: Medicare Other | Admitting: Neurology

## 2018-02-08 ENCOUNTER — Ambulatory Visit: Payer: Medicare Other | Admitting: Cardiovascular Disease

## 2018-02-16 ENCOUNTER — Ambulatory Visit (INDEPENDENT_AMBULATORY_CARE_PROVIDER_SITE_OTHER): Payer: Medicare Other | Admitting: Cardiology

## 2018-02-16 ENCOUNTER — Encounter: Payer: Self-pay | Admitting: Physician Assistant

## 2018-02-16 VITALS — BP 124/80 | HR 66 | Ht 62.0 in | Wt 138.4 lb

## 2018-02-16 DIAGNOSIS — R5383 Other fatigue: Secondary | ICD-10-CM

## 2018-02-16 DIAGNOSIS — I739 Peripheral vascular disease, unspecified: Secondary | ICD-10-CM

## 2018-02-16 DIAGNOSIS — Z720 Tobacco use: Secondary | ICD-10-CM | POA: Diagnosis not present

## 2018-02-16 DIAGNOSIS — I10 Essential (primary) hypertension: Secondary | ICD-10-CM | POA: Diagnosis not present

## 2018-02-16 DIAGNOSIS — E785 Hyperlipidemia, unspecified: Secondary | ICD-10-CM

## 2018-02-16 DIAGNOSIS — G40909 Epilepsy, unspecified, not intractable, without status epilepticus: Secondary | ICD-10-CM | POA: Diagnosis not present

## 2018-02-16 DIAGNOSIS — I208 Other forms of angina pectoris: Secondary | ICD-10-CM

## 2018-02-16 NOTE — Patient Instructions (Signed)
Medication Instructions:  Your physician recommends that you continue on your current medications as directed. Please refer to the Current Medication list given to you today.  If you need a refill on your cardiac medications before your next appointment, please call your pharmacy.   Lab work: none If you have labs (blood work) drawn today and your tests are completely normal, you will receive your results only by: Marland Kitchen MyChart Message (if you have MyChart) OR . A paper copy in the mail If you have any lab test that is abnormal or we need to change your treatment, we will call you to review the results.  Testing/Procedures: Your physician has requested that you have an echocardiogram. Echocardiography is a painless test that uses sound waves to create images of your heart. It provides your doctor with information about the size and shape of your heart and how well your heart's chambers and valves are working. This procedure takes approximately one hour. There are no restrictions for this procedure.  Your physician has requested that you have a lexiscan myoview. For further information please visit [email or web address]. Please follow instruction sheet, as given.  Your physician has requested that you have an ankle brachial index (ABI). During this test an ultrasound and blood pressure cuff are used to evaluate the arteries that supply the arms and legs with blood. Allow thirty minutes for this exam. There are no restrictions or special instructions.  Follow-Up: At [facility], you and your health needs are our priority.  As part of our continuing mission to provide you with exceptional heart care, we have created designated Provider Care Teams.  These Care Teams include your primary Cardiologist (physician) and Advanced Practice Providers (APPs -  Physician Assistants and Nurse Practitioners) who all work together to provide you with the care you need, when you need it. . Please schedule a  follow-up appointment to see Almyra Deforest, PA in 3-4 weeks.  Any Other Special Instructions Will Be Listed Below (If Applicable). none

## 2018-02-16 NOTE — Progress Notes (Signed)
Cardiology Office Note   Date:  02/16/2018   ID:  Wendy Lopez, DOB [birth year], MRN [id number]  PCP:  Wendy Low, MD  Cardiologist: Dr. Shelva Majestic, MD    Chief Complaint  Patient presents with  . Coronary Artery Disease  . Hypertension  . Follow-up  . Fatigue  . Claudication   History of Present Illness: Wendy Lopez is a 57 y.o. female who presents for 1 year cardiology follow up, seen for Dr. Claiborne Billings.   [name] has a prior hx of tobacco abuse beginning at age 70, hx of  tachypalpitations (on beta blocker), invasive ductal carcinoma s/p right mastectomy with sentinel node biopsy post chemotherapy, seizure disorder and mild depression. In 2009, she underwent a cardiac catheterization which revealed normal coronary arteries. In 05/2010 a nuclear perfusion study was performed after she experienced episodes of chest pain and shortness of breath which showed normal perfusion. An echocardiogram study demonstrated mild pulmonary hypertension with estimated RV systolic pressure of 36 mmHg. Because of her issues of recurrent shortness of breath, she underwent a cardiopulmonary MET test in 07 /2013 and was found to have a blunted chronotropic response to exercise, making it difficult for her to improve her endurance and exercise capacity.   She was last seen by Dr. Claiborne Billings on 02/01/17 and was noted to have had a short period of tobacco cessation however re-started after several personal stressors. She had some issues while on Lamictal and was changed to Keppra due to cost. Additionally, she had concern with Metoprolol and self reduced her dose to 169m per day. She noted more palpitations with the short acting version and put herself back on long-acting Toprol-XL. She did report eliminating chocolate and caffeine from her diet to help. She denied anginal symptoms and her palpitations were noted to be well controlled with Toprol-XL 2038mdaily. She denied seizure activity on Keppra and  Dilantin.   Today she states that since August of this year she has been having intermittent, mid-sternal chest pain/tightness without radiation which has been associated with exertion and relieved with rest. She recalls having one particular episode in which she had diaphoresis along with the chest tightness. She states that the episodes have been more frequent recently, happening once or twice per week. She also describes having progressive and overwhelming fatigue for several months which is also a change from her norm. She states that she is able to perform her daily tasks, however will at times need to sit and rest due to her symptoms. She has baseline SOB secondary to ongoing tobacco use and has not noticed any significant change there. She also describes LE claudication symptoms with exertion as well. She states that her palpitations symptoms are improved with the Toprol-XL and with the elimination of caffeine from her diet. She has a strong family history of premature CAD in her mother, father and brother. She currently denies chest pain, palpitations, LE swelling, orthopnea, dizziness, cough, recent URI, fever, chill, N/V, presyncopal or syncopal symptoms.   Past Medical History:  Diagnosis Date  . Anxiety and depression   . Arthritis   . [place] [place]  . Cancer of right breast ([facility]2013  . Depression   . Depression 2014  . Dysrhythmia    palpitations  . [place]ilepsy ([facility]   last seizure 1990's  . H/O hiatal hernia   . Localization-related (focal) (partial) epilepsy and epileptic syndromes with complex partial seizures, without mention of intractable epilepsy 02/07/2013  .  Memory deficit 02/07/2013  . Memory loss   . Mitral valve prolapse 06/01/10   echo- EF>55% mild tricupsid regurgitation. There is mild Pulmonary Hypertension.. Right  ventricular systolic pressure iselevated at 30-40 mmHg. LV systolic function is normal.  . Osteopenia   . PONV (postoperative  nausea and vomiting)   . SOB (shortness of breath) 10/18/11   met-test  . Spinal fusion failure ([place])    3    Past Surgical History:  Procedure Laterality Date  . BACK SURGERY     screws- lumbar  . BREAST SURGERY  2010   mastectomy - RIGHT  . COLONOSCOPY WITH PROPOFOL  03/13/2012   Procedure: COLONOSCOPY WITH PROPOFOL;  Surgeon: Garlan Fair, MD;  Location: [facility] END[facility]COPY;  Service: Endoscopy;  Laterality: N/A;  . ESOPHAGOGASTRODUODENOSCOPY  03/13/2012   Procedure: ESOPHAGOGASTRODUODENOSCOPY (EGD);  Surgeon: Garlan Fair, MD;  Location: [facility];  Service: Endoscopy;  Laterality: N/A;  Reflux, Hiatal Hernia  . HAND TENDON SURGERY  2012   tendons released in right hand   . MASTECTOMY, RADICAL  02/13/2009  . [place], 2003, 2005  . TONSILLECTOMY       Current Outpatient Medications  Medication Sig Dispense Refill  . aspirin-acetaminophen-caffeine (EXCEDRIN EXTRA STRENGTH) 250-250-65 MG per tablet Take 1 tablet by mouth every 6 (six) hours as needed. Pain    . cetirizine (ZYRTEC) 10 MG tablet Take 10 mg by mouth daily.    . cyanocobalamin 100 MCG tablet Take 100 mcg by mouth daily.    Marland Kitchen DILANTIN 100 MG ER capsule Take 1 capsule (100 mg total) by mouth 3 (three) times daily. 270 capsule 3  . levETIRAcetam (KEPPRA) 500 MG tablet TAKE 1 TABLET BY MOUTH EVERY MORNING AND 2 TABLETS BY MOUTH EVERY NIGHT AT BEDTIME 90 tablet 11  . metoprolol succinate (TOPROL-XL) 100 MG 24 hr tablet TAKE 1 TABLET BY MOUTH TWICE DAILY( TAKE WITH OR IMMEDIATELY FOLLOWING A MEAL) 180 tablet 1  . Misc. Devices MISC Please provide patient with right breast prosthesis and 6 mastectomy bras sue to a history of right breast cancer and mastectomy. (ICD10- C50.111) 6 each 0   No current facility-administered medications for this visit.     Allergies:   Carbamazepine; Lamictal [lamotrigine]; Other; Pseudoephedrine hcl er; Valium; Amlodipine; Oxycodone; Prednisone; Ibuprofen; Percocet  [oxycodone-acetaminophen]; Betadine [povidone iodine]; Latex; and Shellfish allergy    Social History:  The patient  reports that she has been smoking cigarettes. She has a 43.00 pack-year smoking history. She has never used smokeless tobacco. She reports that she does not drink alcohol or use drugs.   Family History:  The patient's family history includes Arthritis in her mother; Heart disease in her father, mother, and sister.   ROS:  Please see the history of present illness.   Otherwise, review of systems are positive for none.   All other systems are reviewed and negative.   PHYSICAL EXAM: VS:  BP 124/80   Pulse 66   Ht 5' 2"  (1.575 m)   Wt 138 lb 6.4 oz (62.8 kg)   LMP 11/03/1993   SpO2 96%   BMI 25.31 kg/m  , BMI Body mass index is 25.31 kg/m.  General: Well developed, well nourished, NAD Skin: Warm, dry, intact  Head: Normocephalic, atraumatic, clear, moist mucus membranes. Neck: Negative for carotid bruits. No JVD Lungs:Clear to ausculation bilaterally. No wheezes, rales, or rhonchi. Breathing is unlabored. Cardiovascular: RRR with S1 S2. No murmurs, rubs, gallops, or LV heave appreciated. MSK:  Strength and tone appear normal for age. 5/5 in all extremities Extremities: No edema. No clubbing or cyanosis. DP/PT pulses 1+ bilaterally Neuro: Alert and oriented. No focal deficits. No facial asymmetry. MAE spontaneously. Psych: Responds to questions appropriately with normal affect.     EKG:  EKG is ordered today. The ekg ordered today demonstrates NSR with peaked P-waves, T wave inversions in III, aVF, V1 and V3, similar to prior tracings   Recent Labs: 02/17/2017: TSH 1.610 10/24/2017: ALT 16; BUN 10; Creatinine, Ser 0.73; Hemoglobin 17.4; Platelets 158; Potassium 4.1; Sodium 143   Lipid Panel    Component Value Date/Time   CHOL 170 02/17/2017 0820   TRIG 65 02/17/2017 0820   HDL 70 02/17/2017 0820   CHOLHDL 2.4 02/17/2017 0820   LDLCALC 87 02/17/2017 0820     Wt  Readings from Last 3 Encounters:  02/16/18 138 lb 6.4 oz (62.8 kg)  11/21/17 135 lb 8 oz (61.5 kg)  10/24/17 135 lb 3.2 oz (61.3 kg)     Other studies Reviewed: Additional studies/ records that were reviewed today include:   Echocardiogram 10/16/2012: Study Conclusions  - Left ventricle: Systolic function was normal. The estimated ejection fraction was in the range of 60% to 65%. Wall motion was normal; there were no regional wall motion abnormalities. Left ventricular diastolic function parameters were normal. - Left atrium: LA Volume/BSA 26.4 ml/m2. The atrium was normal in size. - Right ventricle: There is right ventricular hypertrophy. Systolic function was normal. - Atrial septum: No defect or patent foramen ovale was identified. - Tricuspid valve: Moderate regurgitation. - Pulmonary arteries: PA peak pressure: 31m Hg (S). - Inferior vena cava: The vessel was normal in size; the respirophasic diameter changes were in the normal range (= 50%); findings are consistent with normal central venous pressure. - Pericardium, extracardiac: A trivial pericardial effusion was identified posterior to the heart. Features were not consistent with tamponade physiology. Transthoracic echocardiography. M-mode, complete 2D, spectral Doppler, and color Doppler. Height: Height: 157.5cm. Height: 62in. Weight: Weight: 62.6kg. Weight: 137.7lb. Body mass index: BMI: 25.2kg/m^2. Body surface area:  BSA: 1.670m. Blood pressure:   130/80. Patient status: Outpatient. Location: Echo laboratory.  Echocardiogram 03/17/2015: Study Conclusions  - Left ventricle: The cavity size was normal. Systolic function was   normal. The estimated ejection fraction was in the range of 60%   to 65%. Wall motion was normal; there were no regional wall   motion abnormalities. Left ventricular diastolic function   parameters were normal. - Mitral valve: There was trivial  regurgitation. - Right ventricle: The cavity size was normal. Wall thickness was   normal. Systolic function was normal. - Tricuspid valve: There was trivial regurgitation. - Inferior vena cava: The vessel was normal in size. The   respirophasic diameter changes were in the normal range (>= 50%),   consistent with normal central venous pressure.  Myocardial Perfusion Study: 04/17/2014: Impression Exercise Capacity:  Lexiscan with no exercise. BP Response:  Normal blood pressure response. Clinical Symptoms:  Mild shortness of breath ECG Impression:  Baseline inferolateral ST changes and T wave inversion V3-4 without significant change with stress Comparison with Prior Nuclear Study: No significant change from previous study  Overall Impression:  Normal stress nuclear study.  ASSESSMENT AND PLAN:  1.  Chest pain/tightness with no prio hx of CAD: -Reports intermittent, mid-sternal chest tightness that has been occurring since August of this year with exertional features and relieved by rest. She had one episode in which she had associated  diaphoresis. She is mildly SOB at baseline secondary to ongoing tobacco use. Additionally, she has described a progressive and "overwhelming" fatigue that is a change from her baseline since that time. She has not sought medical advice for this in the last several months. She has a strong family hx of premature CAD in her mother, father and brother. She has risk factors which include tobacco use, HTN and strong family history. Her EKG today has non-specific T wave abnormalities, however there is no change from prior tracings. I have discussed the case with Dr. Gwenlyn Found, DOD who states that she would benefit from an OP Lexiscan Stress test. Initial thought was to perform coronary CT, however given the extended wait time, will opt for stress test and based on those results, consider further testing if indicated.   -Will obtain echocardiogram to assess for wall  motion changes from prior study -In 2009, she underwent a cardiac catheterization which revealed normal coronary arteries. In 05/2010 a nuclear perfusion study was performed after she experienced episodes of chest pain and shortness of breath which showed normal perfusion.  -Continue Toprol-XL156m daily   2. Essential HTN: -Stable, 124/80 -Continue Toprol-XL 1078mdaily   3. HLD: -Last LDL, 87 on 02/17/2017 -Will obtain fasting lipid panel in the next week   4. Tobacco Use: -Smoking cessation strongly encouraged  -Reports that she is motivated to quit  5. Hx of palpitations: -Reports symptoms are well controlled -Will continue Toprol-XL 20040mor now -BP stable   6. Seizure disorder: -No recent seizures reported -Continues on Keppra and Dilantin >>>folloed by PCP   7. Claudication symptoms: -Will obtain screening ABI for claudication symptoms which include exertional bilateral leg pain which is worse with walking and relieved by rest. She is at higher risk given her long smoking history.    Current medicines are reviewed at length with the patient today.  The patient does not have concerns regarding medicines.  The following changes have been made:  Obtain echocardiogram, OP Lexiscan Stress test and ABI screening for symptomatic claudication   Labs/ tests ordered today include: See above   Orders Placed This Encounter  Procedures  . MYOCARDIAL PERFUSION IMAGING  . EKG 12-Lead  . ECHOCARDIOGRAM COMPLETE    Disposition:   FU with HaoAlmyra Deforest 1 month  Signed, JilKathyrn DrownP  02/16/2018 4:50 PM    [facility] [address]  [phone]: [phone]: [phone]

## 2018-02-20 DIAGNOSIS — Z23 Encounter for immunization: Secondary | ICD-10-CM | POA: Diagnosis not present

## 2018-02-20 DIAGNOSIS — I10 Essential (primary) hypertension: Secondary | ICD-10-CM | POA: Diagnosis not present

## 2018-02-20 DIAGNOSIS — G40909 Epilepsy, unspecified, not intractable, without status epilepticus: Secondary | ICD-10-CM | POA: Diagnosis not present

## 2018-02-20 DIAGNOSIS — F17210 Nicotine dependence, cigarettes, uncomplicated: Secondary | ICD-10-CM | POA: Diagnosis not present

## 2018-02-20 DIAGNOSIS — J869 Pyothorax without fistula: Secondary | ICD-10-CM | POA: Diagnosis not present

## 2018-02-20 DIAGNOSIS — I700 Atherosclerosis of aorta: Secondary | ICD-10-CM | POA: Diagnosis not present

## 2018-02-20 DIAGNOSIS — C50911 Malignant neoplasm of unspecified site of right female breast: Secondary | ICD-10-CM | POA: Diagnosis not present

## 2018-02-21 ENCOUNTER — Telehealth (HOSPITAL_COMMUNITY): Payer: Self-pay | Admitting: *Deleted

## 2018-02-21 NOTE — Telephone Encounter (Signed)
Patient given detailed instructions per Myocardial Perfusion Study Information Sheet for the test on 02/23/18 at 0730. Patient notified to arrive 15 minutes early and that it is imperative to arrive on time for appointment to keep from having the test rescheduled.  If you need to cancel or reschedule your appointment, please call the office within 24 hours of your appointment. . Patient verbalized understanding.[name], [name]

## 2018-02-22 ENCOUNTER — Other Ambulatory Visit: Payer: Self-pay | Admitting: Cardiology

## 2018-02-22 DIAGNOSIS — M47812 Spondylosis without myelopathy or radiculopathy, cervical region: Secondary | ICD-10-CM | POA: Diagnosis not present

## 2018-02-22 DIAGNOSIS — M25551 Pain in right hip: Secondary | ICD-10-CM | POA: Diagnosis not present

## 2018-02-22 DIAGNOSIS — Z981 Arthrodesis status: Secondary | ICD-10-CM | POA: Diagnosis not present

## 2018-02-22 DIAGNOSIS — I739 Peripheral vascular disease, unspecified: Secondary | ICD-10-CM

## 2018-02-22 DIAGNOSIS — M47816 Spondylosis without myelopathy or radiculopathy, lumbar region: Secondary | ICD-10-CM | POA: Diagnosis not present

## 2018-02-23 ENCOUNTER — Ambulatory Visit (HOSPITAL_COMMUNITY)
Admission: RE | Admit: 2018-02-23 | Discharge: 2018-02-23 | Disposition: A | Discharge status: Home / Self Care | Payer: Medicare Other | Source: Ambulatory Visit | Attending: Cardiology | Admitting: Cardiology

## 2018-02-23 DIAGNOSIS — R5383 Other fatigue: Secondary | ICD-10-CM | POA: Diagnosis not present

## 2018-02-23 DIAGNOSIS — R0789 Other chest pain: Secondary | ICD-10-CM | POA: Diagnosis not present

## 2018-02-23 LAB — MYOCARDIAL PERFUSION IMAGING
LV dias vol: 40 mL (ref 46–106)
LV sys vol: 20 mL
Peak HR: 78 {beats}/min
Rest HR: 56 {beats}/min
SDS: 2
SRS: 0
SSS: 2
TID: 1.04

## 2018-02-23 MED ORDER — TECHNETIUM TC 99M TETROFOSMIN IV KIT
30.2000 | PACK | Freq: Once | INTRAVENOUS | Status: AC | PRN
  Administered 2018-02-23: 30.2 via INTRAVENOUS
  Filled 2018-02-23: qty 31

## 2018-02-23 MED ORDER — TECHNETIUM TC 99M TETROFOSMIN IV KIT
9.7000 | PACK | Freq: Once | INTRAVENOUS | Status: AC | PRN
  Administered 2018-02-23: 9.7 via INTRAVENOUS
  Filled 2018-02-23: qty 10

## 2018-02-23 MED ORDER — ADENOSINE (DIAGNOSTIC) 3 MG/ML IV SOLN
0.5600 mg/kg | Freq: Once | INTRAVENOUS | Status: AC
  Administered 2018-02-23: 35.1 mg via INTRAVENOUS

## 2018-02-26 ENCOUNTER — Telehealth: Payer: Self-pay | Admitting: *Deleted

## 2018-02-26 ENCOUNTER — Ambulatory Visit (HOSPITAL_COMMUNITY): Payer: Medicare Other | Attending: Cardiovascular Disease

## 2018-02-26 ENCOUNTER — Other Ambulatory Visit: Payer: Self-pay

## 2018-02-26 DIAGNOSIS — I10 Essential (primary) hypertension: Secondary | ICD-10-CM | POA: Insufficient documentation

## 2018-02-26 DIAGNOSIS — Z9011 Acquired absence of right breast and nipple: Secondary | ICD-10-CM | POA: Diagnosis not present

## 2018-02-26 DIAGNOSIS — I2510 Atherosclerotic heart disease of native coronary artery without angina pectoris: Secondary | ICD-10-CM | POA: Diagnosis not present

## 2018-02-26 DIAGNOSIS — R5383 Other fatigue: Secondary | ICD-10-CM | POA: Diagnosis not present

## 2018-02-26 DIAGNOSIS — I739 Peripheral vascular disease, unspecified: Secondary | ICD-10-CM | POA: Diagnosis not present

## 2018-02-26 NOTE — Telephone Encounter (Signed)
-----   Message from Nuala Alpha, LPN sent at 15/87/2761  8:21 AM EST -----   ----- Message ----- From: Tommie Raymond, NP Sent: 02/24/2018  10:28 AM EST To: [facility] Triage  Please call the patient and let her know that her stress test was normal with no evidence of prior infarct or ischemia

## 2018-02-26 NOTE — Telephone Encounter (Signed)
Patient notified of result.  Please refer to phone note from today for complete details.   Julaine Hua, [facility] 02/26/2018 8:43 AM  Pt states to me that she is very nervous because during her stress test she states she was having chest pain that was radiating to her left arm w/nasuea. Pt states after she left our office she had to pull off the road for about 30 minutes because of the chest pain still. Pt states she called [facility] from her car and was advised by [facility] person to call 911, though pt did not follow these recommendations. I tried to assure opt while she was in the office for her stress test the nuclear techs were monitoring her heart rate and heart rhythm. I tried to assure her if they felt she was having a cardiac event they would have let her go and they would have consulted with the doctor. Pt has echo scheduled for today @ 10:30. I said let's keep the appt today and see what the echo shows if anything and we will let her know what those results tell the doctor. I advised if she has these symptoms again she has been complaining of, chest pain, left arm pain, nausea that she call 911 or go to the ED. Pt is agreeable to plan of care.

## 2018-03-02 ENCOUNTER — Encounter (HOSPITAL_COMMUNITY): Payer: Medicare Other

## 2018-03-06 ENCOUNTER — Ambulatory Visit (HOSPITAL_COMMUNITY)
Admission: RE | Admit: 2018-03-06 | Discharge: 2018-03-06 | Disposition: A | Discharge status: Home / Self Care | Payer: Medicare Other | Source: Ambulatory Visit | Attending: Cardiovascular Disease | Admitting: Cardiovascular Disease

## 2018-03-06 DIAGNOSIS — I739 Peripheral vascular disease, unspecified: Secondary | ICD-10-CM | POA: Diagnosis not present

## 2018-03-20 ENCOUNTER — Ambulatory Visit: Payer: Medicare Other | Admitting: Physician Assistant

## 2018-03-21 DIAGNOSIS — M792 Neuralgia and neuritis, unspecified: Secondary | ICD-10-CM | POA: Diagnosis not present

## 2018-03-21 DIAGNOSIS — M47816 Spondylosis without myelopathy or radiculopathy, lumbar region: Secondary | ICD-10-CM | POA: Diagnosis not present

## 2018-03-21 DIAGNOSIS — M47812 Spondylosis without myelopathy or radiculopathy, cervical region: Secondary | ICD-10-CM | POA: Diagnosis not present

## 2018-04-06 ENCOUNTER — Encounter: Payer: Self-pay | Admitting: Physician Assistant

## 2018-04-06 ENCOUNTER — Ambulatory Visit (INDEPENDENT_AMBULATORY_CARE_PROVIDER_SITE_OTHER): Payer: Medicare Other | Admitting: Physician Assistant

## 2018-04-06 VITALS — BP 144/70 | HR 71 | Ht 62.0 in | Wt 140.0 lb

## 2018-04-06 DIAGNOSIS — I341 Nonrheumatic mitral (valve) prolapse: Secondary | ICD-10-CM | POA: Diagnosis not present

## 2018-04-06 DIAGNOSIS — I208 Other forms of angina pectoris: Secondary | ICD-10-CM | POA: Diagnosis not present

## 2018-04-06 DIAGNOSIS — R5383 Other fatigue: Secondary | ICD-10-CM | POA: Diagnosis not present

## 2018-04-06 DIAGNOSIS — R079 Chest pain, unspecified: Secondary | ICD-10-CM | POA: Diagnosis not present

## 2018-04-06 DIAGNOSIS — Z720 Tobacco use: Secondary | ICD-10-CM

## 2018-04-06 MED ORDER — METOPROLOL SUCCINATE ER 100 MG PO TB24: 100.0000 mg | ORAL_TABLET | Freq: Two times a day (BID) | ORAL | 1 refills | Status: DC

## 2018-04-06 NOTE — Patient Instructions (Signed)
Medication Instructions:  Your physician recommends that you continue on your current medications as directed. Please refer to the Current Medication list given to you today.  If you need a refill on your cardiac medications before your next appointment, please call your pharmacy.   Lab work: Your physician recommends that you return for lab work today: BMET, CBC, TSH  If you have labs (blood work) drawn today and your tests are completely normal, you will receive your results only by: Marland Kitchen MyChart Message (if you have MyChart) OR . A paper copy in the mail If you have any lab test that is abnormal or we need to change your treatment, we will call you to review the results.  Follow-Up: At [facility], you and your health needs are our priority.  As part of our continuing mission to provide you with exceptional heart care, we have created designated Provider Care Teams.  These Care Teams include your primary Cardiologist (physician) and Advanced Practice Providers (APPs -  Physician Assistants and Nurse Practitioners) who all work together to provide you with the care you need, when you need it. You will need a follow up appointment in 3 months with Dr. Claiborne Billings or one of the following Advanced Practice Providers on your designated Care Team: [place], [place] . Fabian Sharp, PA-C  Any Other Special Instructions Will Be Listed Below (If Applicable). None

## 2018-04-06 NOTE — Progress Notes (Addendum)
Cardiology Office Note    Date:  04/09/2018   ID:  Wendy Lopez, DOB 1961/03/04, MRN [id number]  PCP:  Wenda Low, MD  Cardiologist: Dr. Claiborne Billings  No chief complaint on file.   History of Present Illness:  Wendy Lopez is a 57 y.o. female with past medical history of tobacco abuse, history of hiatal hernia, epilepsy, palpitation, mitral valve prolapse.  She had a cardiac catheterization in 2009 which revealed normal coronaries.  She also had a normal Myoview in February 2012.  Echocardiogram at the time demonstrated mild pulmonary hypertension with RV systolic pressure of 36 mm.  Due to issues with recurrent shortness of breath, she underwent cardiopulmonary stress test and found to have blunted chronotropic response to exercise which make it difficult for her to improve her endurance and exercise capacity.  She also has a history of invasive ductal carcinoma and underwent right mastectomy with sentinel node biopsy and chemotherapy.  Myoview in December 2015 showed normal perfusion with EF 65%.  Patient was last seen by Dr. Claiborne Billings in 2018.  More recently, she was seen by Kathyrn Drown, NP on 02/16/2018.  She was complaining of intermittent midsternal chest pain along with claudication symptoms.  Myoview performed on 02/23/2018 showed EF 67%, low risk Myoview without sign of infection or ischemia.  Echocardiogram obtained on 02/26/2018 showed EF 55 to 60%, normal wall motion, normal diastolic parameter, no evidence of mitral valve prolapse despite her previous history.  Her ABI is completely normal as well.  Patient returns today for follow-up.  She says she did have significant chest pain after receiving Lexiscan, since today she had a stress test, she had 2 more episodes of chest pain.  The last episode was near the end of November which is more than 3 weeks ago.  Her major concern today is fatigue which has been going on for several months.  I will obtain CBC, basic metabolic panel and  TSH to rule out secondary causes for her fatigue.   Past Medical History:  Diagnosis Date  . Anxiety and depression   . Arthritis   . Breast cancer ([place]) 02/13/2009  . Cancer of right breast ([place]) 04/16/2012  . Depression   . Depression 2014  . Dysrhythmia    palpitations  . Epilepsy ([place])    last seizure 1990's  . H/O hiatal hernia   . Localization-related (focal) (partial) epilepsy and epileptic syndromes with complex partial seizures, without mention of intractable epilepsy 02/07/2013  . Memory deficit 02/07/2013  . Memory loss   . Mitral valve prolapse 06/01/10   echo- EF>55% mild tricupsid regurgitation. There is mild Pulmonary Hypertension.. Right  ventricular systolic pressure iselevated at 30-40 mmHg. LV systolic function is normal.  . Osteopenia   . PONV (postoperative nausea and vomiting)   . SOB (shortness of breath) 10/18/11   met-test  . Spinal fusion failure ([place])    3    Past Surgical History:  Procedure Laterality Date  . BACK SURGERY     screws- lumbar  . BREAST SURGERY  2010   mastectomy - RIGHT  . COLONOSCOPY WITH PROPOFOL  03/13/2012   Procedure: COLONOSCOPY WITH PROPOFOL;  Surgeon: Garlan Fair, MD;  Location: [facility];  Service: Endoscopy;  Laterality: N/A;  . ESOPHAGOGASTRODUODENOSCOPY  03/13/2012   Procedure: ESOPHAGOGASTRODUODENOSCOPY (EGD);  Surgeon: Garlan Fair, MD;  Location: [facility];  Service: Endoscopy;  Laterality: N/A;  Reflux, Hiatal Hernia  . HAND TENDON SURGERY  2012   tendons released  in right hand   . MASTECTOMY, RADICAL  02/13/2009  . [place], 2003, 2005  . TONSILLECTOMY      Current Medications: Outpatient Medications Prior to Visit  Medication Sig Dispense Refill  . aspirin-acetaminophen-caffeine (EXCEDRIN EXTRA STRENGTH) 250-250-65 MG per tablet Take 1 tablet by mouth every 6 (six) hours as needed. Pain    . cetirizine (ZYRTEC) 10 MG tablet Take 10 mg by mouth daily.    . cyanocobalamin 100 MCG  tablet Take 100 mcg by mouth daily.    Marland Kitchen DILANTIN 100 MG ER capsule Take 1 capsule (100 mg total) by mouth 3 (three) times daily. 270 capsule 3  . levETIRAcetam (KEPPRA) 500 MG tablet TAKE 1 TABLET BY MOUTH EVERY MORNING AND 2 TABLETS BY MOUTH EVERY NIGHT AT BEDTIME 90 tablet 11  . Misc. Devices MISC Please provide patient with right breast prosthesis and 6 mastectomy bras sue to a history of right breast cancer and mastectomy. (ICD10- C50.111) 6 each 0  . metoprolol succinate (TOPROL-XL) 100 MG 24 hr tablet TAKE 1 TABLET BY MOUTH TWICE DAILY( TAKE WITH OR IMMEDIATELY FOLLOWING A MEAL) 180 tablet 1   No facility-administered medications prior to visit.      Allergies:   Carbamazepine; Lamictal [lamotrigine]; Other; Pseudoephedrine hcl er; Valium; Amlodipine; Oxycodone; Prednisone; Ibuprofen; Percocet [oxycodone-acetaminophen]; Betadine [povidone iodine]; Latex; and Shellfish allergy   Social History   Socioeconomic History  . Marital status: Married    Spouse name: Not on file  . Number of children: 1  . Years of education: college 2  . Highest education level: Not on file  Occupational History  . Occupation: unemployed    Fish farm manager: UNEMPLOYED    Comment: 2003  Social Needs  . Financial resource strain: Not on file  . Food insecurity:    Worry: Not on file    Inability: Not on file  . Transportation needs:    Medical: Not on file    Non-medical: Not on file  Tobacco Use  . Smoking status: Current Every Day Smoker    Packs/day: 1.00    Years: 43.00    Pack years: 43.00    Types: Cigarettes  . Smokeless tobacco: Never Used  . Tobacco comment: 1/2 pk per day  Substance and Sexual Activity  . Alcohol use: No    Alcohol/week: 0.0 standard drinks  . Drug use: No  . Sexual activity: Not on file  Lifestyle  . Physical activity:    Days per week: Not on file    Minutes per session: Not on file  . Stress: Not on file  Relationships  . Social connections:    Talks on phone:  Not on file    Gets together: Not on file    Attends religious service: Not on file    Active member of club or organization: Not on file    Attends meetings of clubs or organizations: Not on file    Relationship status: Not on file  Other Topics Concern  . Not on file  Social History Narrative   Patient is married with one child.   Patient is right handed.   Patient has 2 yrs of education.   Patient does not drink caffeine.     Family History:  The patient's family history includes Arthritis in her mother; Heart disease in her father, mother, and sister.   ROS:   Please see the history of present illness.    ROS All other systems reviewed and are negative.  PHYSICAL EXAM:   VS:  BP (!) 144/70   Pulse 71   Ht '5\' 2"'$  (1.575 m)   Wt 140 lb (63.5 kg)   LMP 11/03/1993   BMI 25.61 kg/m    GEN: Well nourished, well developed, in no acute distress  HEENT: normal  Neck: no JVD, carotid bruits, or masses Cardiac: RRR; no murmurs, rubs, or gallops,no edema  Respiratory:  clear to auscultation bilaterally, normal work of breathing GI: soft, nontender, nondistended, + BS MS: no deformity or atrophy  Skin: warm and dry, no rash Neuro:  Alert and Oriented x 3, Strength and sensation are intact Psych: euthymic mood, full affect  Wt Readings from Last 3 Encounters:  04/06/18 140 lb (63.5 kg)  02/23/18 138 lb (62.6 kg)  02/16/18 138 lb 6.4 oz (62.8 kg)      Studies/Labs Reviewed:   EKG:  EKG is not ordered today.    Recent Labs: 10/24/2017: ALT 16 04/06/2018: BUN 12; Creatinine, Ser 0.81; Hemoglobin 16.4; Platelets 156; Potassium 4.3; Sodium 144; TSH 0.842   Lipid Panel    Component Value Date/Time   CHOL 170 02/17/2017 0820   TRIG 65 02/17/2017 0820   HDL 70 02/17/2017 0820   CHOLHDL 2.4 02/17/2017 0820   LDLCALC 87 02/17/2017 0820    Additional studies/ records that were reviewed today include:   Myoview 02/23/2018 Study Highlights     Nuclear stress EF:  67%.  The left ventricular ejection fraction is hyperdynamic (>65%).  There was no ST segment deviation noted during stress.  The study is normal.  This is a low risk study.   Normal pharmacologic nuclear stress test with no evidence for prior infarct or ischemia.     Echo 02/26/2018 LV EF: 55% -   60% Study Conclusions  - Left ventricle: Myocardium appears echo bright but suspect this   is due to gains set by tech. The cavity size was normal. Systolic   function was normal. The estimated ejection fraction was in the   range of 55% to 60%. Wall motion was normal; there were no   regional wall motion abnormalities. Left ventricular diastolic   function parameters were normal. - Mitral valve: Mitral valve is thickened but no frank prolapse. - Atrial septum: No defect or patent foramen ovale was identified.     ASSESSMENT:    1. Other fatigue   2. Tobacco abuse   3. Mitral valve prolapse   4. Chest pain, unspecified type      PLAN:  In order of problems listed above:  1. Fatigue: Obtain CBC, basic metabolic panel and a TSH.  I recommend for her to quit smoking  2. Tobacco abuse: She is a heavy smoker, we discussed the risk of cardiovascular disease with tobacco abuse  3. Recent chest pain: Reassuring stress test and echocardiogram.  Since the stress test, she did have 2 more episodes of chest pain both occurred at rest, the last episode was over 3 weeks ago.  We will continue to observe.    4. Mitral valve prolapse: Despite history of mitral valve prolapse, this is not evident on recent echocardiogram.    Medication Adjustments/Labs and Tests Ordered: Current medicines are reviewed at length with the patient today.  Concerns regarding medicines are outlined above.  Medication changes, Labs and Tests ordered today are listed in the Patient Instructions below. Patient Instructions  Medication Instructions:  Your physician recommends that you continue on your current  medications as directed. Please refer to  the Current Medication list given to you today.  If you need a refill on your cardiac medications before your next appointment, please call your pharmacy.   Lab work: Your physician recommends that you return for lab work today: BMET, CBC, TSH  If you have labs (blood work) drawn today and your tests are completely normal, you will receive your results only by: Marland Kitchen MyChart Message (if you have MyChart) OR . A paper copy in the mail If you have any lab test that is abnormal or we need to change your treatment, we will call you to review the results.  Follow-Up: At [facility], you and your health needs are our priority.  As part of our continuing mission to provide you with exceptional heart care, we have created designated Provider Care Teams.  These Care Teams include your primary Cardiologist (physician) and Advanced Practice Providers (APPs -  Physician Assistants and Nurse Practitioners) who all work together to provide you with the care you need, when you need it. You will need a follow up appointment in 3 months with Dr. Claiborne Billings or one of the following Advanced Practice Providers on your designated Care Team: [place], [place] . Fabian Sharp, PA-C  Any Other Special Instructions Will Be Listed Below (If Applicable). None      Hilbert Corrigan, [place]  04/09/2018 12:07 AM    [facility] [place], [place], [place]  [address] Phone: [phone]; Fax: [phone]

## 2018-04-07 LAB — CBC
Hematocrit: 44.7 % (ref 34.0–46.6)
Hemoglobin: 16.4 g/dL — ABNORMAL HIGH (ref 11.1–15.9)
MCH: 31.8 pg (ref 26.6–33.0)
MCHC: 36.7 g/dL — ABNORMAL HIGH (ref 31.5–35.7)
MCV: 87 fL (ref 79–97)
Platelets: 156 10*3/uL (ref 150–450)
RBC: 5.15 x10E6/uL (ref 3.77–5.28)
RDW: 12.6 % (ref 12.3–15.4)
WBC: 8.1 10*3/uL (ref 3.4–10.8)

## 2018-04-07 LAB — BASIC METABOLIC PANEL
BUN/Creatinine Ratio: 15 (ref 9–23)
BUN: 12 mg/dL (ref 6–24)
CO2: 23 mmol/L (ref 20–29)
Calcium: 9 mg/dL (ref 8.7–10.2)
Chloride: 105 mmol/L (ref 96–106)
Creatinine, Ser: 0.81 mg/dL (ref 0.57–1.00)
GFR calc Af Amer: 93 mL/min/{1.73_m2} (ref >59)
GFR calc non Af Amer: 80 mL/min/{1.73_m2} (ref >59)
Glucose: 89 mg/dL (ref 65–99)
Potassium: 4.3 mmol/L (ref 3.5–5.2)
Sodium: 144 mmol/L (ref 134–144)

## 2018-04-07 LAB — TSH: TSH: 0.842 u[IU]/mL (ref 0.450–4.500)

## 2018-04-09 ENCOUNTER — Encounter: Payer: Self-pay | Admitting: Physician Assistant

## 2018-04-09 DIAGNOSIS — Z853 Personal history of malignant neoplasm of breast: Secondary | ICD-10-CM | POA: Diagnosis not present

## 2018-04-09 DIAGNOSIS — Z13220 Encounter for screening for lipoid disorders: Secondary | ICD-10-CM | POA: Diagnosis not present

## 2018-04-09 DIAGNOSIS — E559 Vitamin D deficiency, unspecified: Secondary | ICD-10-CM | POA: Diagnosis not present

## 2018-04-09 DIAGNOSIS — Z0000 Encounter for general adult medical examination without abnormal findings: Secondary | ICD-10-CM | POA: Diagnosis not present

## 2018-04-09 DIAGNOSIS — F17210 Nicotine dependence, cigarettes, uncomplicated: Secondary | ICD-10-CM | POA: Diagnosis not present

## 2018-04-09 DIAGNOSIS — J869 Pyothorax without fistula: Secondary | ICD-10-CM | POA: Diagnosis not present

## 2018-04-09 DIAGNOSIS — R5383 Other fatigue: Secondary | ICD-10-CM | POA: Diagnosis not present

## 2018-04-09 DIAGNOSIS — Z23 Encounter for immunization: Secondary | ICD-10-CM | POA: Diagnosis not present

## 2018-04-09 DIAGNOSIS — G40909 Epilepsy, unspecified, not intractable, without status epilepticus: Secondary | ICD-10-CM | POA: Diagnosis not present

## 2018-04-09 DIAGNOSIS — M519 Unspecified thoracic, thoracolumbar and lumbosacral intervertebral disc disorder: Secondary | ICD-10-CM | POA: Diagnosis not present

## 2018-04-09 DIAGNOSIS — Z1159 Encounter for screening for other viral diseases: Secondary | ICD-10-CM | POA: Diagnosis not present

## 2018-04-09 DIAGNOSIS — I700 Atherosclerosis of aorta: Secondary | ICD-10-CM | POA: Diagnosis not present

## 2018-04-09 DIAGNOSIS — I10 Essential (primary) hypertension: Secondary | ICD-10-CM | POA: Diagnosis not present

## 2018-04-09 DIAGNOSIS — F322 Major depressive disorder, single episode, severe without psychotic features: Secondary | ICD-10-CM | POA: Diagnosis not present

## 2018-04-09 DIAGNOSIS — R002 Palpitations: Secondary | ICD-10-CM | POA: Diagnosis not present

## 2018-04-10 NOTE — Progress Notes (Signed)
Borderline high red blood cell count which is normal for her when compare to previous lab, thyroid level normal, kidney function and electrolyte ok

## 2018-05-01 ENCOUNTER — Telehealth: Payer: Self-pay | Admitting: Neurology

## 2018-05-01 ENCOUNTER — Encounter: Payer: Medicare Other | Admitting: Neurology

## 2018-05-01 NOTE — Telephone Encounter (Signed)
This patient canceled same day of an EMG and nerve conduction study evaluation.

## 2018-05-02 ENCOUNTER — Encounter: Payer: Self-pay | Admitting: Neurology

## 2018-05-29 ENCOUNTER — Ambulatory Visit (INDEPENDENT_AMBULATORY_CARE_PROVIDER_SITE_OTHER): Payer: Medicare Other | Admitting: Neurology

## 2018-05-29 ENCOUNTER — Encounter: Payer: Self-pay | Admitting: Neurology

## 2018-05-29 DIAGNOSIS — R202 Paresthesia of skin: Secondary | ICD-10-CM

## 2018-05-29 NOTE — Procedures (Signed)
     HISTORY:  Wendy Lopez is a 58 year old patient with a history of breast cancer, status post chemotherapy including Taxol.  The patient reports paresthesias on all 4 extremities that will come and go.  She does have some gait instability.  The patient is being evaluated for a possible peripheral neuropathy.  NERVE CONDUCTION STUDIES:  Nerve conduction studies were performed on both upper extremities. The distal motor latencies and motor amplitudes for the median and ulnar nerves were within normal limits. The nerve conduction velocities for these nerves were also normal. The sensory latencies for the median and ulnar nerves were normal. The F wave latencies for the ulnar nerves were within normal limits. Nerve conduction studies were performed on the right lower extremity. The distal motor latencies and motor amplitudes for the peroneal and posterior tibial nerves were within normal limits. The nerve conduction velocities for these nerves were also normal. The sensory latencies for the peroneal and sural nerves were within normal limits. The F wave latency for the posterior tibial nerve was within normal limits.   EMG STUDIES:  EMG study was performed on the right upper extremity:  The first dorsal interosseous muscle reveals 2 to 4 K units with full recruitment. No fibrillations or positive waves were noted. The abductor pollicis brevis muscle reveals 2 to 4 K units with full recruitment. No fibrillations or positive waves were noted. The extensor indicis proprius muscle reveals 1 to 3 K units with full recruitment. No fibrillations or positive waves were noted. The pronator teres muscle reveals 2 to 3 K units with full recruitment. No fibrillations or positive waves were noted. The biceps muscle reveals 1 to 2 K units with full recruitment. No fibrillations or positive waves were noted. The triceps muscle reveals 2 to 4 K units with full recruitment. No fibrillations or positive waves  were noted. The anterior deltoid muscle reveals 2 to 3 K units with full recruitment. No fibrillations or positive waves were noted. The cervical paraspinal muscles were tested at 2 levels. No abnormalities of insertional activity were seen at either level tested. There was good relaxation.   IMPRESSION:  Nerve conduction studies done on both upper extremities and the right lower extremity were within normal limits.  No evidence of a peripheral neuropathy was seen.  EMG evaluation of the right upper extremity was unremarkable, no evidence of an overlying cervical radiculopathy was noted.  Jill Alexanders MD 05/29/2018 9:47 AM  [facility] [address] [place], [place] [address]  Phone [phone] Fax [phone]

## 2018-05-29 NOTE — Progress Notes (Signed)
Please refer to EMG and nerve conduction procedure note.  

## 2018-05-29 NOTE — Progress Notes (Addendum)
The patient comes in today with problems with intermittent paresthesias involving the arms and legs, she does have some gait instability.  She has a history of chemotherapy in the past using Taxol.  She is being evaluated for a possible neuropathy, but nerve conduction studies done today were unremarkable.  EMG of the right arm was unremarkable.  The patient claims that Dr. Carloyn Manner has done MRI of the cervical spine, likely at [facility].  I will try to get the results of this study.  The patient reports pain in the medial upper leg on the right that occurs with weightbearing, goes away when she sits down.  This likely represents a tendinopathy at an insertion point, not a neuropathy pain.      Lowgap    Nerve / Sites Muscle Latency Ref. Amplitude Ref. Rel Amp Segments Distance Velocity Ref. Area    ms ms mV mV %  cm m/s m/s mVms  L Median - APB     Wrist APB 2.9 ?4.4 9.0 ?4.0 100 Wrist - APB 7   31.4     Upper arm APB 6.1  9.0  99.9 Upper arm - Wrist 18 57 ?49 30.7  R Median - APB     Wrist APB 3.0 ?4.4 6.0 ?4.0 100 Wrist - APB 7   23.6     Upper arm APB 6.1  6.0  101 Upper arm - Wrist 18 58 ?49 22.5  L Ulnar - ADM     Wrist ADM 2.2 ?3.3 8.5 ?6.0 100 Wrist - ADM 7   18.9     B.Elbow ADM 5.0  7.2  85 B.Elbow - Wrist 16 58 ?49 17.1     A.Elbow ADM 6.6  6.5  90.4 A.Elbow - B.Elbow 10 62 ?49 15.6         A.Elbow - Wrist      R Ulnar - ADM     Wrist ADM 2.4 ?3.3 7.1 ?6.0 100 Wrist - ADM 7   15.2     B.Elbow ADM 4.7  6.6  93.4 B.Elbow - Wrist 15 64 ?49 14.6     A.Elbow ADM 6.8  5.8  87.9 A.Elbow - B.Elbow 10 49 ?49 13.7         A.Elbow - Wrist      R Peroneal - EDB     Ankle EDB 3.9 ?6.5 7.3 ?2.0 100 Ankle - EDB 9   22.2     Fib head EDB 9.2  6.9  95.6 Fib head - Ankle 27 51 ?44 21.5     Pop fossa EDB 10.9  6.5  93.6 Pop fossa - Fib head 10 56 ?44 21.5         Pop fossa - Ankle      R Tibial - AH     Ankle AH 3.0 ?5.8 15.2 ?4.0 100 Ankle - AH 9   21.0     Pop fossa AH 9.1  10.0  65.6  Pop fossa - Ankle 32 53 ?41 19.5                  SNC    Nerve / Sites Rec. Site Peak Lat Ref.  Amp Ref. Segments Distance    ms ms V V  cm  R Sural - Ankle (Calf)     Calf Ankle 2.9 ?4.4 11 ?6 Calf - Ankle 14  R Superficial peroneal - Ankle     Lat leg Ankle 3.3 ?4.4 9 ?6 Lat  leg - Ankle 14  L Median - Orthodromic (Dig II, Mid palm)     Dig II Wrist 2.8 ?3.4 19 ?10 Dig II - Wrist 13  R Median - Orthodromic (Dig II, Mid palm)     Dig II Wrist 2.9 ?3.4 24 ?10 Dig II - Wrist 13  L Ulnar - Orthodromic, (Dig V, Mid palm)     Dig V Wrist 2.4 ?3.1 6 ?5 Dig V - Wrist 11  R Ulnar - Orthodromic, (Dig V, Mid palm)     Dig V Wrist 2.5 ?3.1 6 ?5 Dig V - Wrist 17                 F  Wave    Nerve F Lat Ref.   ms ms  L Ulnar - ADM 24.3 ?32.0  R Ulnar - ADM 24.2 ?32.0  R Tibial - AH 40.7 ?56.0

## 2018-07-09 ENCOUNTER — Telehealth: Payer: Self-pay

## 2018-07-09 DIAGNOSIS — M47812 Spondylosis without myelopathy or radiculopathy, cervical region: Secondary | ICD-10-CM | POA: Diagnosis not present

## 2018-07-09 DIAGNOSIS — M25551 Pain in right hip: Secondary | ICD-10-CM | POA: Diagnosis not present

## 2018-07-09 DIAGNOSIS — M47816 Spondylosis without myelopathy or radiculopathy, lumbar region: Secondary | ICD-10-CM | POA: Diagnosis not present

## 2018-07-09 NOTE — Telephone Encounter (Signed)
   Cardiac Questionnaire:    Since your last visit or hospitalization:    1. Have you been having new or worsening chest pain? No   2. Have you been having new or worsening shortness of breath? No 3. Have you been having new or worsening leg swelling, wt gain, or increase in abdominal girth (pants fitting more tightly)? No   4. Have you had any passing out spells? No           Primary Cardiologist:  Shelva Majestic, MD (cardiac/new sleep)   Patient contacted.  History reviewed.  No symptoms to suggest any unstable cardiac conditions.  Based on discussion, with current pandemic situation, we will be postponing this appointment for [facility].  If symptoms change, she has been instructed to contact our office.   Routing to C19 CANCEL pool for tracking (P CV DIV CV19 CANCEL) and assigning priority (1 = 4-6 wks, 2 = 6-12 wks, 3 = >12 wks).  Priority 3  Wendy Dawley, LPN  2/37/6283 1:51 PM         .

## 2018-07-11 ENCOUNTER — Ambulatory Visit: Payer: Medicare Other | Admitting: Cardiovascular Disease

## 2018-07-16 ENCOUNTER — Telehealth: Payer: Self-pay

## 2018-07-16 NOTE — Telephone Encounter (Signed)
Called patient to see if she was interested in doing video visits or telephone visits. Patient was left call back number.

## 2018-09-21 ENCOUNTER — Other Ambulatory Visit: Payer: Self-pay

## 2018-09-21 MED ORDER — METOPROLOL SUCCINATE ER 100 MG PO TB24: 100.0000 mg | ORAL_TABLET | Freq: Two times a day (BID) | ORAL | 2 refills | Status: DC

## 2018-10-10 DIAGNOSIS — I700 Atherosclerosis of aorta: Secondary | ICD-10-CM | POA: Diagnosis not present

## 2018-10-10 DIAGNOSIS — J439 Emphysema, unspecified: Secondary | ICD-10-CM | POA: Diagnosis not present

## 2018-10-10 DIAGNOSIS — F17210 Nicotine dependence, cigarettes, uncomplicated: Secondary | ICD-10-CM | POA: Diagnosis not present

## 2018-10-10 DIAGNOSIS — C50911 Malignant neoplasm of unspecified site of right female breast: Secondary | ICD-10-CM | POA: Diagnosis not present

## 2018-10-10 DIAGNOSIS — G40909 Epilepsy, unspecified, not intractable, without status epilepticus: Secondary | ICD-10-CM | POA: Diagnosis not present

## 2018-10-10 DIAGNOSIS — I10 Essential (primary) hypertension: Secondary | ICD-10-CM | POA: Diagnosis not present

## 2018-10-10 DIAGNOSIS — F322 Major depressive disorder, single episode, severe without psychotic features: Secondary | ICD-10-CM | POA: Diagnosis not present

## 2018-10-10 DIAGNOSIS — E559 Vitamin D deficiency, unspecified: Secondary | ICD-10-CM | POA: Diagnosis not present

## 2018-10-15 DIAGNOSIS — G5601 Carpal tunnel syndrome, right upper limb: Secondary | ICD-10-CM | POA: Diagnosis not present

## 2018-10-15 DIAGNOSIS — G5622 Lesion of ulnar nerve, left upper limb: Secondary | ICD-10-CM | POA: Diagnosis not present

## 2018-10-15 DIAGNOSIS — M542 Cervicalgia: Secondary | ICD-10-CM | POA: Diagnosis not present

## 2018-10-15 DIAGNOSIS — G5602 Carpal tunnel syndrome, left upper limb: Secondary | ICD-10-CM | POA: Diagnosis not present

## 2018-10-15 DIAGNOSIS — G5621 Lesion of ulnar nerve, right upper limb: Secondary | ICD-10-CM | POA: Diagnosis not present

## 2018-10-18 ENCOUNTER — Other Ambulatory Visit (HOSPITAL_COMMUNITY): Payer: Medicare Other

## 2018-10-25 DIAGNOSIS — M542 Cervicalgia: Secondary | ICD-10-CM | POA: Diagnosis not present

## 2018-10-26 ENCOUNTER — Ambulatory Visit (HOSPITAL_COMMUNITY): Payer: Medicare Other | Admitting: Hematology

## 2018-10-30 DIAGNOSIS — M542 Cervicalgia: Secondary | ICD-10-CM | POA: Diagnosis not present

## 2018-10-30 DIAGNOSIS — G5601 Carpal tunnel syndrome, right upper limb: Secondary | ICD-10-CM | POA: Diagnosis not present

## 2018-11-05 ENCOUNTER — Telehealth: Payer: Self-pay | Admitting: Cardiovascular Disease

## 2018-11-05 NOTE — Telephone Encounter (Signed)
Spoke to Wendy Lopez to change appointment with Dr. Claiborne Billings on Wednesday, 7/22 at 1:20 PM to a regular office visit. Wendy Lopez agreed and verbalized thanks.

## 2018-11-07 ENCOUNTER — Ambulatory Visit (INDEPENDENT_AMBULATORY_CARE_PROVIDER_SITE_OTHER): Payer: Medicare Other | Admitting: Cardiovascular Disease

## 2018-11-07 ENCOUNTER — Other Ambulatory Visit: Payer: Self-pay

## 2018-11-07 ENCOUNTER — Telehealth: Payer: Self-pay | Admitting: Radiology

## 2018-11-07 ENCOUNTER — Other Ambulatory Visit: Payer: Self-pay | Admitting: Cardiovascular Disease

## 2018-11-07 ENCOUNTER — Encounter: Payer: Self-pay | Admitting: Cardiovascular Disease

## 2018-11-07 VITALS — BP 132/81 | HR 69 | Ht 62.0 in | Wt 133.4 lb

## 2018-11-07 DIAGNOSIS — Z720 Tobacco use: Secondary | ICD-10-CM

## 2018-11-07 DIAGNOSIS — R002 Palpitations: Secondary | ICD-10-CM

## 2018-11-07 DIAGNOSIS — R5383 Other fatigue: Secondary | ICD-10-CM | POA: Diagnosis not present

## 2018-11-07 DIAGNOSIS — I10 Essential (primary) hypertension: Secondary | ICD-10-CM | POA: Diagnosis not present

## 2018-11-07 DIAGNOSIS — G40909 Epilepsy, unspecified, not intractable, without status epilepticus: Secondary | ICD-10-CM

## 2018-11-07 DIAGNOSIS — R0789 Other chest pain: Secondary | ICD-10-CM

## 2018-11-07 MED ORDER — DILTIAZEM HCL 30 MG PO TABS: 30.0000 mg | ORAL_TABLET | ORAL | 1 refills | Status: DC | PRN

## 2018-11-07 NOTE — Progress Notes (Signed)
Patient ID: Wendy Lopez, female   DOB: 02-03-1961, 58 y.o.   MRN: [id number]     HPI: Wendy Lopez is a 58 y.o. female who presents for a 65 month cardiology follow-up evaluation.   Wendy Lopez has a long-standing history of tobacco abuse and started smoking at age 56.  In 2009 cardiac catheterization revealed normal coronary arteries. In February 2012 a nuclear perfusion study was done after she experienced episodes of chest pain and shortness of breath and this revealed normal perfusion. An echo Doppler study demonstrated mild pulmonary hypertension with estimated RV systolic pressure of 36 mm. Because of issues of recurrent shortness of breath she underwent a cardiopulmonary met test and was found to have a blunted chronotropic response to exercise which is making it difficult for her to improve her endurance and exercise capacity. She has a history of tachypalpitations. She has a history of invasive ductal carcinoma and is status post right mastectomy with sentinel node biopsy and is status post chemotherapy. She has a history of vitamin D insufficiency mild blood pressure elevation. There is also a history of depression.  On 10/16/2012 , a 2-D echo Doppler study  revealed an ejection fraction in the range of 60-65%. She did not have wall motion abnormalities and had normal diastolic function. There was evidence for right ventricular hypertrophy with normal RV function. She again had mild pulmonary hypertension with an estimated pressure 39 mm. There is moderate tricuspid regurgitation. Wendy Lopez continues to smoke cigarettes. She does note some indigestion. He also has noticed some left leg weakness and has issues with L4-L5 disc disease. She previously was on a higher dose of present Brintellex but due to tremors she has reduced his dose to 5 mg daily.  A subsequent nuclear perfusion study was done on 11/20/2012 after her ECG revealed slight additional T-wave abnormalities and was  essentially normal without wall motion abnormality, scar or ischemia.  She has a history of cervical disc disease and had noted some upper back discomfort in the past.  In December 2015, her EKG had shown  more pronounced downsloping ST segment depression in the inferior to inferolateral leads.  I recommended a subsequent nuclear perfusion study.  This was done on 04/17/2014 , which was not significant change from her previous study.  She again had basal inferolateral ST-T changes with T-wave inversion in V3 through V4 which did not significant change with stress.  She had normal perfusion with post-rest ejection fraction at 65%.  When seen in 2016 she was smoking one half pack of cigarettes per day.  She had had medication issues and was on Keppra in place of Lamictal for seizures due to cost. Recently, she  had some issue with metoprolol succinate.  She had self reduced this to 100 mg per day.  When I saw her several months ago.  She was wanting to switch to a shorter acting version, however, noted more palpitations on the tartrate preparation. As result, she put herself back on long-acting Toprol.  I last saw her in October 2018 and over the 2 years previously she was  without anginal symptoms.  She quit smoking in December 2017, but unfortunately resumed during the period of increased stress 4 months later.  Unfortunately she was still smoking cigarettes.  She was no longer having caffeine or chocolate.  She denies any exertional chest pain.  Her palpitations have been fairly well controlled on Toprol 200 mg daily.  She denied seizure activity and is on  Keppra and Dilantin.    Since I last saw her she has undergone several additional evaluations with Kathyrn Drown, NP and Almyra Deforest, [place] in November and December 2019.  She had experienced intermittent midsternal chest pain/tightness without radiation which she described to Kathyrn Drown, NP on 02/16/2018.  She subsequently underwent a Lexiscan Myoview  which was normal.  EF was greater than 65%.  Because of potential lower extremity claudication issues on March 06, 2018 lower semi-Doppler imaging revealed normal ABIs bilaterally.  An echo Doppler study November 2019 showed an EF of 55 to 60% without wall motion abnormalities.  She had a mildly thickened mitral valve without frank prolapse.  She was seen by Almyra Deforest, PA in follow-up.  She also recently underwent nerve conduction studies by Dr. Floyde Parkins and she does not have peripheral neuropathy.  She continues to have issues with her neck and since Dr. Carloyn Manner has retired she is now seeing Dr. Princess Bruins.  Unfortunately she still smoking 1/2 to 1 pack/day.  She admits to occasional palpitations at night with heart rates going up into the 110 range.  Apparently she had had a home study for evaluation of potential sleep apnea by Dr. Deforest Hoyles but has not yet heard of the results.  She presents for evaluation.  Past Medical History:  Diagnosis Date   Anxiety and depression    Arthritis    Breast cancer ([place]) 02/13/2009   Cancer of right breast ([place]) 04/16/2012   Depression    Depression 2014   Dysrhythmia    palpitations   Epilepsy ([place])    last seizure 1990's   H/O hiatal hernia    Localization-related (focal) (partial) epilepsy and epileptic syndromes with complex partial seizures, without mention of intractable epilepsy 02/07/2013   Memory deficit 02/07/2013   Memory loss    Mitral valve prolapse 06/01/10   echo- EF>55% mild tricupsid regurgitation. There is mild Pulmonary Hypertension.. Right  ventricular systolic pressure iselevated at 30-40 mmHg. LV systolic function is normal.   Osteopenia    PONV (postoperative nausea and vomiting)    SOB (shortness of breath) 10/18/11   met-test   Spinal fusion failure ([place])    3    Past Surgical History:  Procedure Laterality Date   BACK SURGERY     screws- lumbar   BREAST SURGERY  2010   mastectomy - RIGHT   COLONOSCOPY  WITH PROPOFOL  03/13/2012   Procedure: COLONOSCOPY WITH PROPOFOL;  Surgeon: Garlan Fair, MD;  Location: [facility];  Service: Endoscopy;  Laterality: N/A;   ESOPHAGOGASTRODUODENOSCOPY  03/13/2012   Procedure: ESOPHAGOGASTRODUODENOSCOPY (EGD);  Surgeon: Garlan Fair, MD;  Location: [facility];  Service: Endoscopy;  Laterality: N/A;  Reflux, Hiatal Hernia   HAND TENDON SURGERY  2012   tendons released in right hand    MASTECTOMY, RADICAL  02/13/2009   SPINE SURGERY  1999, 2003, 2005   TONSILLECTOMY      Allergies  Allergen Reactions   Carbamazepine Anaphylaxis   Lamictal [Lamotrigine] Anaphylaxis    SOB, Lamotrigine only   Other     STATES HAS SHORTNESS OF BREATH WITH GENERIC DRUGS     ALSO RASH AND ITCHING   Pseudoephedrine Hcl Er Other (See Comments)    SEIZURES.   Valium Other (See Comments)    Hallucinations, spasticity, hyper-relfexive    Amlodipine Rash    Face swell   Oxycodone Nausea And Vomiting and Rash   Prednisone Rash and Other (See Comments)    Prescribing MD  told patient he thinks dose (dose pack) was "just too high." 03/09/11   STATES HAS TAKEN SMALLER DOSE AND STILL HAD RASH AND JITTERINESS   Ibuprofen Swelling   Percocet [Oxycodone-Acetaminophen] Nausea And Vomiting   Betadine [Povidone Iodine] Rash   Latex Dermatitis and Rash   Shellfish Allergy Swelling    Current Outpatient Medications  Medication Sig Dispense Refill   aspirin-acetaminophen-caffeine (EXCEDRIN EXTRA STRENGTH) 250-250-65 MG per tablet Take 1 tablet by mouth every 6 (six) hours as needed. Pain     cetirizine (ZYRTEC) 10 MG tablet Take 10 mg by mouth daily.     cyanocobalamin 100 MCG tablet Take 100 mcg by mouth daily.     DILANTIN 100 MG ER capsule Take 1 capsule (100 mg total) by mouth 3 (three) times daily. 270 capsule 3   levETIRAcetam (KEPPRA) 500 MG tablet TAKE 1 TABLET BY MOUTH EVERY MORNING AND 2 TABLETS BY MOUTH EVERY NIGHT AT BEDTIME 90 tablet 11     metoprolol succinate (TOPROL-XL) 100 MG 24 hr tablet Take 1 tablet (100 mg total) by mouth 2 (two) times daily. Take with or immediately following a meal. 180 tablet 2   Misc. Devices MISC Please provide patient with right breast prosthesis and 6 mastectomy bras sue to a history of right breast cancer and mastectomy. (ICD10- C50.111) 6 each 0   diltiazem (CARDIZEM) 30 MG tablet TAKE 1 TABLET BY MOUTH AS NEEDED FOR PALPITATIONS 90 tablet 1   No current facility-administered medications for this visit.     Social History   Socioeconomic History   Marital status: Married    Spouse name: Not on file   Number of children: 1   Years of education: college 2   Highest education level: Not on file  Occupational History   Occupation: unemployed    Fish farm manager: UNEMPLOYED    Comment: 2003  Social Designer, fashion/clothing strain: Not on file   Food insecurity    Worry: Not on file    Inability: Not on file   Transportation needs    Medical: Not on file    Non-medical: Not on file  Tobacco Use   Smoking status: Current Every Day Smoker    Packs/day: 1.00    Years: 43.00    Pack years: 43.00    Types: Cigarettes   Smokeless tobacco: Never Used   Tobacco comment: 1/2 pk per day  Substance and Sexual Activity   Alcohol use: No    Alcohol/week: 0.0 standard drinks   Drug use: No   Sexual activity: Not on file  Lifestyle   Physical activity    Days per week: Not on file    Minutes per session: Not on file   Stress: Not on file  Relationships   Social connections    Talks on phone: Not on file    Gets together: Not on file    Attends religious service: Not on file    Active member of club or organization: Not on file    Attends meetings of clubs or organizations: Not on file    Relationship status: Not on file   Intimate partner violence    Fear of current or ex partner: Not on file    Emotionally abused: Not on file    Physically abused: Not on file     Forced sexual activity: Not on file  Other Topics Concern   Not on file  Social History Narrative   Patient is married with one child.  Patient is right handed.   Patient has 2 yrs of education.   Patient does not drink caffeine.    Social she is married and has one child. She has been smoking since age 48.  ROS General: Negative; No fevers, chills, or night sweats;  HEENT: Negative; No changes in vision or hearing, sinus congestion, difficulty swallowing Pulmonary: Negative; No cough, wheezing, shortness of breath, hemoptysis Cardiovascular: See HPI GI: Negative; No nausea, vomiting, diarrhea, or abdominal pain GU: Negative; No dysuria, hematuria, or difficulty voiding Musculoskeletal: Neck and leg discomfort Hematologic/Oncology: Remote history of stage I breast CA of the right breast with recent evaluation felt to be stable. Endocrine: Negative; no heat/cold intolerance; no diabetes Neuro: Remote history of seizure disorder for which she had been on chronic Lamictal and Dilantin and is followed now by Dr. Jannifer Franklin; previously she had been followed by Dr. Erling Cruz.  She was switched to [place] due to cost since she could not take generic Lamictal.  Intermittent paresthesias in her extremities  Skin: Negative; No rashes or skin lesions Psychiatric: Negative; No behavioral problems, depression Sleep: Fatigability:  No snoring, daytime sleepiness, hypersomnolence, bruxism, restless legs, hypnogognic hallucinations, no cataplexy Other comprehensive 14 point system review is negative.   PE BP 132/81    Pulse 69    Ht _0  (1.575 m)    Wt 133 lb 6.4 oz (60.5 kg)    LMP 11/03/1993    SpO2 94%    BMI 24.40 kg/m    Repeat blood pressure by me 130/80  Wt Readings from Last 3 Encounters:  11/07/18 133 lb 6.4 oz (60.5 kg)  04/06/18 140 lb (63.5 kg)  02/23/18 138 lb (62.6 kg)   General: Alert, oriented, no distress.  Skin: normal turgor, no rashes, warm and dry HEENT: Normocephalic,  atraumatic. Pupils equal round and reactive to light; sclera anicteric; extraocular muscles intact;  Nose without nasal septal hypertrophy Mouth/Parynx benign; Mallinpatti scale previously noted to be 2, not currently assessed since she was wearing a mask Neck: No JVD, no carotid bruits; normal carotid upstroke Lungs: clear to ausculatation and percussion; no wheezing or rales Chest wall: without tenderness to palpitation Heart: PMI not displaced, RRR, s1 s2 normal, 1/6 systolic murmur, no diastolic murmur, no rubs, gallops, thrills, or heaves Abdomen: soft, nontender; no hepatosplenomehaly, BS+; abdominal aorta nontender and not dilated by palpation. Back: no CVA tenderness Pulses 2+ Musculoskeletal: full range of motion, normal strength, no joint deformities Extremities: no clubbing cyanosis or edema, Homan's sign negative  Neurologic: grossly nonfocal; Cranial nerves grossly wnl Psychologic: Normal mood and affect   ECG (independently read by me): Normal sinus rhythm at 69 bpm.  Possible left atrial enlargement.  Inferolateral T wave abnormality  October 2018 ECG (independently read by me): Normal sinus rhythm at 69 bpm. ,  Probable left atrial enlargement.  Inferolateral ST changes.  Normal intervals.  No ectopy.  June 2017 ECG (independently read by me): Normal sinus rhythm at 71 bpm.  Will biatrial enlargement.  Inferolateral ST-T wave abnormality.  Normal intervals.  March 2017 ECG (independently read by me):  Normal sinus rhythm at 77 bpm.  Biatrial enlargement.  Previously noted inferolateral T wave abnormalities.  November 2016 ECG (independently read by me): Normal sinus rhythm at 75 bpm.  Inferolateral ST-T wave abnormalities.  03/31/2014 ECG (independently read by me) : Normal sinus rhythm at 75 bpm.  There is now significantly more pronounced downsloping ST segment depression in leads II, III, and F V4 through V6  and T-wave inversion in V3 compared to her prior ECG of over one  year ago.  Prior ECG: Normal sinus rhythm at 68. T-wave abnormalities  inferiorly in V3 through V6  LABS:   BMP Latest Ref Rng & Units 04/06/2018 10/24/2017 02/17/2017  Glucose 65 - 99 mg/dL 89 98 94  BUN 6 - 24 mg/dL _0 Creatinine 0.57 - 1.00 mg/dL 0.81 0.73 0.79  BUN/Creat Ratio 9 - 23 15 - 13  Sodium 134 - 144 mmol/L 144 143 145(H)  Potassium 3.5 - 5.2 mmol/L 4.3 4.1 4.5  Chloride 96 - 106 mmol/L 105 107 105  CO2 20 - 29 mmol/L _1 Calcium 8.7 - 10.2 mg/dL 9.0 9.2 9.2    Hepatic Function Latest Ref Rng & Units 10/24/2017 02/17/2017 10/26/2016  Total Protein 6.5 - 8.1 g/dL 7.2 6.2 6.3  Albumin 3.5 - 5.0 g/dL 4.3 4.4 4.0  AST 15 - 41 U/L _2 ALT 0 - 44 U/L 16 14 33(H)  Alk Phosphatase 38 - 126 U/L 117 114 107  Total Bilirubin 0.3 - 1.2 mg/dL 0.5 0.3 0.3    CBC Latest Ref Rng & Units 04/06/2018 10/24/2017 02/17/2017  WBC 3.4 - 10.8 x10E3/uL 8.1 9.9 7.3  Hemoglobin 11.1 - 15.9 g/dL 16.4(H) 17.4(H) 17.7(H)  Hematocrit 34.0 - 46.6 % 44.7 49.4(H) 48.8(H)  Platelets 150 - 450 x10E3/uL 156 158 178    Lab Results  Component Value Date   MCV 87 04/06/2018   MCV 88.5 10/24/2017   MCV 91 02/17/2017    Lab Results  Component Value Date   TSH 0.842 04/06/2018     Lab Results  Component Value Date   MCV 87 04/06/2018   MCV 88.5 10/24/2017   MCV 91 02/17/2017   Lab Results  Component Value Date   TSH 0.842 04/06/2018  No results found for: HGBA1C   Lipid Panel     Component Value Date/Time   CHOL 170 02/17/2017 0820   TRIG 65 02/17/2017 0820   HDL 70 02/17/2017 0820   CHOLHDL 2.4 02/17/2017 0820   LDLCALC 87 02/17/2017 0820    RADIOLOGY: No results found.  IMPRESSION:  1. Palpitations   2. Essential hypertension   3. Other fatigue   4. Atypical chest pain   5. Tobacco abuse   6. Seizure disorder [facility])     ASSESSMENT AND PLAN: Wendy Lopez is a 58 year old female who has a long-standing tobacco history of greater than 42 years.  In  January 2009  cardiac catheterization at [facility] revealed normal coronary arteries. A nuclear perfusion study in 2012 showed normal perfusion. A cardiopulmonary met test in July 2013 showed blunted chronotropic response without ischemic changes but she had reduced functional status with peak O2 at 69% of predicted. She has mild pulmonary hypertension.  An echo revealed normal function without wall motion abnormalities despite her ECG changes.  A subsequent stress test in 2015 which was done because of more abnormal ST-T changes remain stable. A repeat echo Doppler study in November 2016, which essentially was unchanged and showed an EF of 60-65%.  There was trivial TR and MR.  Pulmonary pressures were normal.  In November 2019 due to recurrent intermittent episodes of chest tightness a Lexiscan Myoview study continued to show normal perfusion and was low risk.  She has been on Toprol-XL 200 mg daily and recently has noticed episodes of increasing heart rate.  These typically have been occurring  at night and her primary physician apparently had scheduled her for a home sleep study, results are still pending.  Her blood pressure today is mildly increased in stage I hypertension based on most recent hypertensive guidelines.  I am giving her a prescription initially for diltiazem 30 mg to take on a as needed basis.  I am scheduling her for a 2-week event monitor for further evaluation of her heart rate and rhythm.  I again discussed the importance of complete smoking cessation.  She continues to be on Keppra and Dilantin and has not had recent seizure activity.  I reviewed the records of Dr. Jannifer Franklin and she was not found to have peripheral neuropathy.  I will see her in 2 to 3 months for reevaluation and further recommendations were made at that time.  Time spent: 25 minutes Troy Sine, MD, [facility]  11/09/2018 3:27 PM

## 2018-11-07 NOTE — Telephone Encounter (Signed)
Enrolled patient for a 14 Day [employer] Event monitor to be mailed. Brief instructions were gone over with the patient and she knows to expect the monitor to arrive in 3-4 days

## 2018-11-07 NOTE — Patient Instructions (Signed)
Medication Instructions:  Start Diltiazem 30 mg PRN as needed for palpitations.  If you need a refill on your cardiac medications before your next appointment, please call your pharmacy.   Testing/Procedures: Your physician has recommended that you wear an event monitor-14 day. Event monitors are medical devices that record the heart's electrical activity. Doctors most often [place] these monitors to diagnose arrhythmias. Arrhythmias are problems with the speed or rhythm of the heartbeat. The monitor is a small, portable device. You can wear one while you do your normal daily activities. This is usually used to diagnose what is causing palpitations/syncope (passing out). This will be performed at our [facility] location - [address].   Follow-Up: At [facility], you and your health needs are our priority.  As part of our continuing mission to provide you with exceptional heart care, we have created designated Provider Care Teams.  These Care Teams include your primary Cardiologist (physician) and Advanced Practice Providers (APPs -  Physician Assistants and Nurse Practitioners) who all work together to provide you with the care you need, when you need it. You will need a follow up appointment in 2-3 months. You may see Shelva Majestic, MD or one of the following Advanced Practice Providers on your designated Care Team: [place], [place] . Fabian Sharp, PA-C

## 2018-11-09 ENCOUNTER — Encounter: Payer: Self-pay | Admitting: Cardiovascular Disease

## 2018-11-13 ENCOUNTER — Other Ambulatory Visit: Payer: Self-pay | Admitting: Neurology

## 2018-11-15 ENCOUNTER — Encounter (INDEPENDENT_AMBULATORY_CARE_PROVIDER_SITE_OTHER): Payer: Medicare Other

## 2018-11-15 DIAGNOSIS — R002 Palpitations: Secondary | ICD-10-CM | POA: Diagnosis not present

## 2018-11-19 ENCOUNTER — Other Ambulatory Visit (HOSPITAL_COMMUNITY): Payer: Self-pay | Admitting: *Deleted

## 2018-11-19 DIAGNOSIS — Z170 Estrogen receptor positive status [ER+]: Secondary | ICD-10-CM

## 2018-11-19 DIAGNOSIS — C50111 Malignant neoplasm of central portion of right female breast: Secondary | ICD-10-CM

## 2018-11-20 ENCOUNTER — Inpatient Hospital Stay (HOSPITAL_COMMUNITY): Payer: Medicare Other | Attending: Hematology

## 2018-11-20 ENCOUNTER — Other Ambulatory Visit: Payer: Self-pay

## 2018-11-20 DIAGNOSIS — C50111 Malignant neoplasm of central portion of right female breast: Secondary | ICD-10-CM

## 2018-11-20 DIAGNOSIS — Z9221 Personal history of antineoplastic chemotherapy: Secondary | ICD-10-CM | POA: Diagnosis not present

## 2018-11-20 DIAGNOSIS — G40909 Epilepsy, unspecified, not intractable, without status epilepticus: Secondary | ICD-10-CM | POA: Diagnosis not present

## 2018-11-20 DIAGNOSIS — F17210 Nicotine dependence, cigarettes, uncomplicated: Secondary | ICD-10-CM | POA: Diagnosis not present

## 2018-11-20 DIAGNOSIS — Z7982 Long term (current) use of aspirin: Secondary | ICD-10-CM | POA: Insufficient documentation

## 2018-11-20 DIAGNOSIS — Z9011 Acquired absence of right breast and nipple: Secondary | ICD-10-CM | POA: Insufficient documentation

## 2018-11-20 DIAGNOSIS — Z79899 Other long term (current) drug therapy: Secondary | ICD-10-CM | POA: Insufficient documentation

## 2018-11-20 DIAGNOSIS — Z170 Estrogen receptor positive status [ER+]: Secondary | ICD-10-CM | POA: Insufficient documentation

## 2018-11-20 DIAGNOSIS — F329 Major depressive disorder, single episode, unspecified: Secondary | ICD-10-CM | POA: Insufficient documentation

## 2018-11-20 DIAGNOSIS — F419 Anxiety disorder, unspecified: Secondary | ICD-10-CM | POA: Insufficient documentation

## 2018-11-20 DIAGNOSIS — C50911 Malignant neoplasm of unspecified site of right female breast: Secondary | ICD-10-CM | POA: Diagnosis not present

## 2018-11-20 LAB — LACTATE DEHYDROGENASE: LDH: 153 U/L (ref 98–192)

## 2018-11-20 LAB — CBC WITH DIFFERENTIAL/PLATELET
Abs Immature Granulocytes: 0.02 10*3/uL (ref 0.00–0.07)
Basophils Absolute: 0.1 10*3/uL (ref 0.0–0.1)
Basophils Relative: 1 %
Eosinophils Absolute: 0.2 10*3/uL (ref 0.0–0.5)
Eosinophils Relative: 3 %
HCT: 48.3 % — ABNORMAL HIGH (ref 36.0–46.0)
Hemoglobin: 16.4 g/dL — ABNORMAL HIGH (ref 12.0–15.0)
Immature Granulocytes: 0 %
Lymphocytes Relative: 46 %
Lymphs Abs: 2.9 10*3/uL (ref 0.7–4.0)
MCH: 30.8 pg (ref 26.0–34.0)
MCHC: 34 g/dL (ref 30.0–36.0)
MCV: 90.6 fL (ref 80.0–100.0)
Monocytes Absolute: 0.5 10*3/uL (ref 0.1–1.0)
Monocytes Relative: 8 %
Neutro Abs: 2.7 10*3/uL (ref 1.7–7.7)
Neutrophils Relative %: 42 %
Platelets: 185 10*3/uL (ref 150–400)
RBC: 5.33 MIL/uL — ABNORMAL HIGH (ref 3.87–5.11)
RDW: 12.6 % (ref 11.5–15.5)
WBC: 6.4 10*3/uL (ref 4.0–10.5)
nRBC: 0 % (ref 0.0–0.2)

## 2018-11-20 LAB — FERRITIN: Ferritin: 53 ng/mL (ref 11–307)

## 2018-11-20 LAB — COMPREHENSIVE METABOLIC PANEL
ALT: 24 U/L (ref 0–44)
AST: 21 U/L (ref 15–41)
Albumin: 3.9 g/dL (ref 3.5–5.0)
Alkaline Phosphatase: 99 U/L (ref 38–126)
Anion gap: 9 (ref 5–15)
BUN: 14 mg/dL (ref 6–20)
CO2: 26 mmol/L (ref 22–32)
Calcium: 8.7 mg/dL — ABNORMAL LOW (ref 8.9–10.3)
Chloride: 106 mmol/L (ref 98–111)
Creatinine, Ser: 0.73 mg/dL (ref 0.44–1.00)
GFR calc Af Amer: >60 mL/min (ref >60)
GFR calc non Af Amer: >60 mL/min (ref >60)
Glucose, Bld: 111 mg/dL — ABNORMAL HIGH (ref 70–99)
Potassium: 4.4 mmol/L (ref 3.5–5.1)
Sodium: 141 mmol/L (ref 135–145)
Total Bilirubin: 0.4 mg/dL (ref 0.3–1.2)
Total Protein: 6.7 g/dL (ref 6.5–8.1)

## 2018-11-26 ENCOUNTER — Other Ambulatory Visit: Payer: Self-pay

## 2018-11-26 ENCOUNTER — Ambulatory Visit: Payer: Medicare Other | Admitting: Nurse Practitioner

## 2018-11-26 ENCOUNTER — Encounter: Payer: Self-pay | Admitting: Neurology

## 2018-11-26 ENCOUNTER — Ambulatory Visit (INDEPENDENT_AMBULATORY_CARE_PROVIDER_SITE_OTHER): Payer: Medicare Other | Admitting: Neurology

## 2018-11-26 VITALS — BP 126/81 | HR 69 | Temp 98.2°F | Ht 62.0 in | Wt 133.0 lb

## 2018-11-26 DIAGNOSIS — G40209 Localization-related (focal) (partial) symptomatic epilepsy and epileptic syndromes with complex partial seizures, not intractable, without status epilepticus: Secondary | ICD-10-CM | POA: Diagnosis not present

## 2018-11-26 DIAGNOSIS — Z5181 Encounter for therapeutic drug level monitoring: Secondary | ICD-10-CM | POA: Diagnosis not present

## 2018-11-26 MED ORDER — LEVETIRACETAM 500 MG PO TABS: ORAL_TABLET | ORAL | 3 refills | Status: DC

## 2018-11-26 MED ORDER — DILANTIN 100 MG PO CAPS: 100.0000 mg | ORAL_CAPSULE | Freq: Three times a day (TID) | ORAL | 3 refills | Status: DC

## 2018-11-26 NOTE — Progress Notes (Signed)
Reason for visit: Seizures, paresthesias   Wendy Lopez is an 58 y.o. female  History of present illness:  Wendy Lopez is a 58 year old right-handed white female with a history of seizures who has been relatively well controlled on Dilantin and Keppra.  Two weeks ago however, she awakened and had bitten her tongue, she does not know for sure whether she had a seizure or not.  The patient continues to have migratory paresthesias in all 4 extremities and has had a recent MRI of the cervical spine done by Dr. Christella Noa.  She was told that this study did not explain her sensory symptoms.  The patient however began having sensory complaints while on Taxol and this problem has continued for 10 years or more.  The patient returns to this office for an evaluation.   Past Medical History:  Diagnosis Date  . Anxiety and depression   . Arthritis   . Breast cancer ([place]) 02/13/2009  . Cancer of right breast ([place]) 04/16/2012  . Depression   . Depression 2014  . Dysrhythmia    palpitations  . Epilepsy ([place])    last seizure 1990's  . H/O hiatal hernia   . Localization-related (focal) (partial) epilepsy and epileptic syndromes with complex partial seizures, without mention of intractable epilepsy 02/07/2013  . Memory deficit 02/07/2013  . Memory loss   . Mitral valve prolapse 06/01/10   echo- EF>55% mild tricupsid regurgitation. There is mild Pulmonary Hypertension.. Right  ventricular systolic pressure iselevated at 30-40 mmHg. LV systolic function is normal.  . Osteopenia   . PONV (postoperative nausea and vomiting)   . SOB (shortness of breath) 10/18/11   met-test  . 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 BACK SURGERY     screws- lumbar  . BREAST SURGERY  2010   mastectomy - RIGHT  . COLONOSCOPY WITH PROPOFOL  03/13/2012   Procedure: COLONOSCOPY WITH PROPOFOL;  Surgeon: Garlan Fair, MD;  Location: [facility];  Service:  Endoscopy;  Laterality: N/A;  . ESOPHAGOGASTRODUODENOSCOPY  03/13/2012   Procedure: ESOPHAGOGASTRODUODENOSCOPY (EGD);  Surgeon: Garlan Fair, MD;  Location: [facility];  Service: Endoscopy;  Laterality: N/A;  Reflux, Hiatal Hernia  . HAND TENDON SURGERY  2012   tendons released in right hand   . MASTECTOMY, RADICAL  02/13/2009  . [place], 2003, 2005  . TONSILLECTOMY      Family History  Problem Relation Age of Onset  . Arthritis Mother        rhuematoid  . Heart disease Mother        Atrial fib  . Heart disease Father   . Heart disease Sister        Atrial fib    Social history:  reports that she has been smoking cigarettes. She has a 43.00 pack-year smoking history. She has never used smokeless tobacco. She reports that she does not drink alcohol or use drugs.    Allergies  Allergen Reactions  . Carbamazepine Anaphylaxis  . Lamictal [Lamotrigine] Anaphylaxis    SOB, Lamotrigine only  . Other     STATES HAS SHORTNESS OF BREATH WITH GENERIC DRUGS     ALSO RASH AND ITCHING  . Pseudoephedrine Hcl Er Other (See Comments)    SEIZURES.  . Valium Other (See Comments)    Hallucinations, spasticity, hyper-relfexive   . Amlodipine Rash    Face swell  . Oxycodone Nausea And  Vomiting and Rash  . Prednisone Rash and Other (See Comments)    Prescribing MD told patient he thinks dose (dose pack) was "just too high." 03/09/11   STATES HAS TAKEN SMALLER DOSE AND STILL HAD RASH AND JITTERINESS  . Ibuprofen Swelling  . Percocet [Oxycodone-Acetaminophen] Nausea And Vomiting  . Betadine [Povidone Iodine] Rash  . Latex Dermatitis and Rash  . Shellfish Allergy Swelling    Medications:  Prior to Admission medications   Medication Sig Start Date End Date Taking? Authorizing Provider  aspirin-acetaminophen-caffeine (EXCEDRIN EXTRA STRENGTH) [phone] MG per tablet Take 1 tablet by mouth every 6 (six) hours as needed. Pain   Yes [provider]  cetirizine  (ZYRTEC) 10 MG tablet Take 10 mg by mouth daily.   Yes [provider]  cyanocobalamin 100 MCG tablet Take 100 mcg by mouth daily.   Yes [provider]  DILANTIN 100 MG ER capsule Take 1 capsule (100 mg total) by mouth 3 (three) times daily. 11/26/18  Yes Kathrynn Ducking, MD  diltiazem (CARDIZEM) 30 MG tablet TAKE 1 TABLET BY MOUTH AS NEEDED FOR PALPITATIONS 11/08/18  Yes Troy Sine, MD  levETIRAcetam (KEPPRA) 500 MG tablet One tablet in the morning and 2 in the evening 11/26/18  Yes Kathrynn Ducking, MD  metoprolol succinate (TOPROL-XL) 100 MG 24 hr tablet Take 1 tablet (100 mg total) by mouth 2 (two) times daily. Take with or immediately following a meal. 09/21/18  Yes Almyra Deforest, [place]  Misc. Devices MISC Please provide patient with right breast prosthesis and 6 mastectomy bras sue to a history of right breast cancer and mastectomy. (ICD10- C50.111) 08/09/16  Yes Holley Bouche, NP    ROS:  Out of a complete 14 system review of symptoms, the patient complains only of the following symptoms, and all other reviewed systems are negative.  Sensory changes  Blood pressure 126/81, pulse 69, temperature 98.2 F (36.8 C), temperature source Temporal, height _0  (1.575 m), weight 133 lb (60.3 kg), last menstrual period 11/03/1993.  Physical Exam  General: The patient is alert and cooperative at the time of the examination.  Skin: No significant peripheral edema is noted.   Neurologic Exam  Mental status: The patient is alert and oriented x 3 at the time of the examination. The patient has apparent normal recent and remote memory, with an apparently normal attention span and concentration ability.   Cranial nerves: Facial symmetry is present. Speech is normal, no aphasia or dysarthria is noted. Extraocular movements are full. Visual fields are full.  Motor: The patient has good strength in all 4 extremities.  Sensory examination: Soft touch sensation is symmetric on  the face, arms, and legs.  Coordination: The patient has good finger-nose-finger and heel-to-shin bilaterally.  Gait and station: The patient has a normal gait. Tandem gait is normal. Romberg is negative. No drift is seen.  Reflexes: Deep tendon reflexes are symmetric.   Assessment/Plan:  1.  Migratory sensory changes  2.  History of seizures, possible recent recurrence  The patient will be sent for blood work today, if the Dilantin level remains in the low therapeutic range we will add 30 mg dosing to her regimen.  She will continue on the current dose of Keppra and Dilantin for now, she must take brand-name Dilantin.  She will follow-up in 6 months.  A prescription for the Keppra and Dilantin were sent in.  Jill Alexanders MD 11/26/2018 10:57 AM  [facility] [id number]  [address] [place], [place] [address]  Phone [phone] Fax [phone]

## 2018-11-27 ENCOUNTER — Ambulatory Visit (HOSPITAL_COMMUNITY): Payer: Medicare Other | Admitting: Nurse Practitioner

## 2018-11-28 ENCOUNTER — Telehealth: Payer: Self-pay | Admitting: Neurology

## 2018-11-28 LAB — CBC WITH DIFFERENTIAL/PLATELET
Basophils Absolute: 0.1 10*3/uL (ref 0.0–0.2)
Basos: 1 %
EOS (ABSOLUTE): 0.2 10*3/uL (ref 0.0–0.4)
Eos: 2 %
Hematocrit: 49.9 % — ABNORMAL HIGH (ref 34.0–46.6)
Hemoglobin: 17.1 g/dL — ABNORMAL HIGH (ref 11.1–15.9)
Immature Grans (Abs): 0 10*3/uL (ref 0.0–0.1)
Immature Granulocytes: 0 %
Lymphocytes Absolute: 3.5 10*3/uL — ABNORMAL HIGH (ref 0.7–3.1)
Lymphs: 43 %
MCH: 30.9 pg (ref 26.6–33.0)
MCHC: 34.3 g/dL (ref 31.5–35.7)
MCV: 90 fL (ref 79–97)
Monocytes Absolute: 0.7 10*3/uL (ref 0.1–0.9)
Monocytes: 8 %
Neutrophils Absolute: 3.7 10*3/uL (ref 1.4–7.0)
Neutrophils: 46 %
Platelets: 192 10*3/uL (ref 150–450)
RBC: 5.53 x10E6/uL — ABNORMAL HIGH (ref 3.77–5.28)
RDW: 12.8 % (ref 11.7–15.4)
WBC: 8.1 10*3/uL (ref 3.4–10.8)

## 2018-11-28 LAB — COMPREHENSIVE METABOLIC PANEL
ALT: 18 IU/L (ref 0–32)
AST: 15 IU/L (ref 0–40)
Albumin/Globulin Ratio: 2.1 (ref 1.2–2.2)
Albumin: 4.4 g/dL (ref 3.8–4.9)
Alkaline Phosphatase: 120 IU/L — ABNORMAL HIGH (ref 39–117)
BUN/Creatinine Ratio: 10 (ref 9–23)
BUN: 8 mg/dL (ref 6–24)
Bilirubin Total: 0.3 mg/dL (ref 0.0–1.2)
CO2: 26 mmol/L (ref 20–29)
Calcium: 9.2 mg/dL (ref 8.7–10.2)
Chloride: 104 mmol/L (ref 96–106)
Creatinine, Ser: 0.78 mg/dL (ref 0.57–1.00)
GFR calc Af Amer: 97 mL/min/{1.73_m2} (ref >59)
GFR calc non Af Amer: 84 mL/min/{1.73_m2} (ref >59)
Globulin, Total: 2.1 g/dL (ref 1.5–4.5)
Glucose: 84 mg/dL (ref 65–99)
Potassium: 5.2 mmol/L (ref 3.5–5.2)
Sodium: 143 mmol/L (ref 134–144)
Total Protein: 6.5 g/dL (ref 6.0–8.5)

## 2018-11-28 LAB — PHENYTOIN LEVEL, TOTAL: Phenytoin (Dilantin), Serum: 12.4 ug/mL (ref 10.0–20.0)

## 2018-11-28 LAB — LEVETIRACETAM LEVEL: Levetiracetam Lvl: 33.1 ug/mL (ref 10.0–40.0)

## 2018-11-28 MED ORDER — PHENYTOIN SODIUM EXTENDED 30 MG PO CAPS: 30.0000 mg | ORAL_CAPSULE | Freq: Every day | ORAL | 1 refills | Status: DC

## 2018-11-28 NOTE — Telephone Encounter (Signed)
I called the patient.  The blood work shows an elevated hemoglobin level, the patient is a smoker and this is the likely source of this.  The alkaline phosphatase level is slightly elevated, likely related to Dilantin use, not clinically significant.  Both the Keppra and the Dilantin are in the therapeutic range but the doses of both of them can be increased.  I will add a 30 mg capsule to the Dilantin and recheck the blood work in about 3 weeks.  The patient may have had a recent seizure in sleep, she bit her tongue a couple weeks ago.

## 2018-12-12 ENCOUNTER — Inpatient Hospital Stay (HOSPITAL_BASED_OUTPATIENT_CLINIC_OR_DEPARTMENT_OTHER): Payer: Medicare Other | Admitting: Nurse Practitioner

## 2018-12-12 ENCOUNTER — Other Ambulatory Visit: Payer: Self-pay

## 2018-12-12 ENCOUNTER — Other Ambulatory Visit (HOSPITAL_COMMUNITY): Payer: Self-pay | Admitting: Nurse Practitioner

## 2018-12-12 VITALS — BP 116/80 | HR 75 | Temp 97.7°F | Resp 14 | Wt 132.0 lb

## 2018-12-12 DIAGNOSIS — Z1239 Encounter for other screening for malignant neoplasm of breast: Secondary | ICD-10-CM | POA: Diagnosis not present

## 2018-12-12 DIAGNOSIS — F17210 Nicotine dependence, cigarettes, uncomplicated: Secondary | ICD-10-CM | POA: Diagnosis not present

## 2018-12-12 DIAGNOSIS — C50911 Malignant neoplasm of unspecified site of right female breast: Secondary | ICD-10-CM | POA: Diagnosis not present

## 2018-12-12 DIAGNOSIS — Z170 Estrogen receptor positive status [ER+]: Secondary | ICD-10-CM | POA: Diagnosis not present

## 2018-12-12 DIAGNOSIS — Z1231 Encounter for screening mammogram for malignant neoplasm of breast: Secondary | ICD-10-CM

## 2018-12-12 DIAGNOSIS — C50111 Malignant neoplasm of central portion of right female breast: Secondary | ICD-10-CM

## 2018-12-12 DIAGNOSIS — Z9011 Acquired absence of right breast and nipple: Secondary | ICD-10-CM | POA: Diagnosis not present

## 2018-12-12 DIAGNOSIS — F329 Major depressive disorder, single episode, unspecified: Secondary | ICD-10-CM | POA: Diagnosis not present

## 2018-12-12 DIAGNOSIS — F419 Anxiety disorder, unspecified: Secondary | ICD-10-CM | POA: Diagnosis not present

## 2018-12-12 NOTE — Progress Notes (Signed)
[name], [name] [address]   CLINIC:  Medical Oncology/Hematology  PCP:  Wenda Low, MD [address] [place] [address] [phone]   REASON FOR VISIT: Follow-up for right breast cancer  CURRENT THERAPY: Surveillance per NCCN guidelines  BRIEF ONCOLOGIC HISTORY:  Oncology History  Cancer of right breast ([place])  01/13/2009 Initial Diagnosis   Needle core biopsy of 12 o'clock mass in right breast and axillary lymph node demonstrating invasive mammary carcinoma with negative lymph node.  ER 99%, PR 54%, Ki-67 15%, and Her2  negative.   02/13/2009 Definitive Surgery   Right simple mastectomy by Dr. Dalbert Batman demonstrating a 1.7 cm invasive ductal carcinoma, grade 1, no LVI, clear margins and 0/3 sentinel lymph nodes.    Oncotype testing   Intermediate-risk; was offered chemotherapy     Adjuvant Chemotherapy   Taxotere/Cytoxan x 3 cycles completed (unable to complete final cycle due to intolerance; patient weight was reportedly not dose-adjusted).     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  No subsequent anti-estrogen therapy was given.     Procedure   Per patient, she had genetic testing in the past; results were negative per her report.       CANCER STAGING: Cancer Staging Cancer of right breast [facility]) Staging form: Breast, AJCC 7th Edition - Clinical stage from 04/06/2015: Stage IA (T1c, N0, M0) - Signed by Baird Cancer, PA-C on 04/06/2015    INTERVAL HISTORY:  Ms. Wendy Lopez 58 y.o. female returns for routine follow-up for right breast cancer.  She reports she is been feeling fine since her last visit.  She does report some numbness and tingling in her hands and feet.  She is also seeing a neurologist about this pain. Denies any nausea, vomiting, or  diarrhea. Denies any new pains. Had not noticed any recent bleeding such as epistaxis, hematuria or hematochezia. Denies recent chest pain on exertion, shortness of breath on minimal exertion, pre-syncopal episodes, or palpitations. Denies any numbness or tingling in hands or feet. Denies any recent fevers, infections, or recent hospitalizations. Patient reports appetite at 100% and energy level at 50%.  She is eating well maintaining her weight at this time.    REVIEW OF SYSTEMS:  Review of Systems  Constitutional: Positive for fatigue.  Neurological: Positive for numbness.  All other systems reviewed and are negative.    PAST MEDICAL/SURGICAL HISTORY:  Past Medical History:  Diagnosis Date  . Anxiety and depression   . Arthritis   . Breast cancer ([place]) 02/13/2009  . Cancer of right breast ([place]) 04/16/2012  . Depression   . Depression 2014  . Dysrhythmia    palpitations  . Epilepsy ([place])    last seizure 1990's  . H/O hiatal hernia   . Localization-related (focal) (partial) epilepsy and epileptic syndromes with complex partial seizures, without mention of intractable epilepsy 02/07/2013  . Memory deficit 02/07/2013  . Memory loss   . Mitral valve prolapse 06/01/10   echo- EF>55% mild tricupsid regurgitation. There is mild Pulmonary Hypertension.. Right  ventricular systolic pressure iselevated at 30-40 mmHg. LV systolic function is normal.  . Osteopenia   . PONV (postoperative nausea and vomiting)   . SOB (shortness of breath) 10/18/11   met-test  . 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 BACK SURGERY     screws- lumbar  . BREAST SURGERY  2010   mastectomy - RIGHT  . COLONOSCOPY WITH PROPOFOL  03/13/2012   Procedure: COLONOSCOPY WITH PROPOFOL;  Surgeon: Garlan Fair, MD;  [facility];  Service: Endoscopy;  Laterality: N/A;  . ESOPHAGOGASTRODUODENOSCOPY  03/13/2012   Procedure: ESOPHAGOGASTRODUODENOSCOPY  (EGD);  Surgeon: Garlan Fair, MD;  Location: [facility];  Service: Endoscopy;  Laterality: N/A;  Reflux, Hiatal Hernia  . HAND TENDON SURGERY  2012   tendons released in right hand   . MASTECTOMY, RADICAL  02/13/2009  . [place], 2003, 2005  . TONSILLECTOMY       SOCIAL HISTORY:  Social History   Socioeconomic History  . Marital status: Married    Spouse name: Not on file  . Number of children: 1  . Years of education: college 2  . Highest education level: Not on file  Occupational History  . Occupation: unemployed    Fish farm manager: UNEMPLOYED    Comment: 2003  Social Needs  . Financial resource strain: Not on file  . Food insecurity    Worry: Not on file    Inability: Not on file  . Transportation needs    Medical: Not on file    Non-medical: Not on file  Tobacco Use  . Smoking status: Current Every Day Smoker    Packs/day: 1.00    Years: 43.00    Pack years: 43.00    Types: Cigarettes  . Smokeless tobacco: Never Used  . Tobacco comment: 1/2 pk per day  Substance and Sexual Activity  . Alcohol use: No    Alcohol/week: 0.0 standard drinks  . Drug use: No  . Sexual activity: Not on file  Lifestyle  . Physical activity    Days per week: Not on file    Minutes per session: Not on file  . Stress: Not on file  Relationships  . Social Herbalist on phone: Not on file    Gets together: Not on file    Attends religious service: Not on file    Active member of club or organization: Not on file    Attends meetings of clubs or organizations: Not on file    Relationship status: Not on file  . Intimate partner violence    Fear of current or ex partner: Not on file    Emotionally abused: Not on file    Physically abused: Not on file    Forced sexual activity: Not on file  Other Topics Concern  . Not on file  Social History Narrative   Patient is married with one child.   Patient is right handed.   Patient has 2 yrs of education.   Patient  does not drink caffeine.    FAMILY HISTORY:  Family History  Problem Relation Age of Onset  . Arthritis Mother        rhuematoid  . Heart disease Mother        Atrial fib  . Heart disease Father   . Heart disease Sister        Atrial fib    CURRENT MEDICATIONS:  Outpatient Encounter Medications as of 12/12/2018  Medication Sig  . cyanocobalamin 100 MCG tablet Take 100 mcg by mouth daily.  Marland Kitchen DILANTIN 100 MG ER capsule Take 1 capsule (100 mg total) by mouth 3 (three) times daily.  Marland Kitchen levETIRAcetam (KEPPRA) 500 MG tablet One tablet in the  morning and 2 in the evening  . metoprolol succinate (TOPROL-XL) 100 MG 24 hr tablet Take 1 tablet (100 mg total) by mouth 2 (two) times daily. Take with or immediately following a meal.  . Misc. Devices MISC Please provide patient with right breast prosthesis and 6 mastectomy bras sue to a history of right breast cancer and mastectomy. (ICD10- C50.111)  . phenytoin (DILANTIN) 30 MG ER capsule Take 1 capsule (30 mg total) by mouth daily.  . Vitamin D, Ergocalciferol, (DRISDOL) 1.25 MG (50000 UT) CAPS capsule Take 50,000 Units by mouth every 7 (seven) days.  Marland Kitchen aspirin-acetaminophen-caffeine (EXCEDRIN EXTRA STRENGTH) 250-250-65 MG per tablet Take 1 tablet by mouth every 6 (six) hours as needed. Pain  . cetirizine (ZYRTEC) 10 MG tablet Take 10 mg by mouth daily.  Marland Kitchen diltiazem (CARDIZEM) 30 MG tablet TAKE 1 TABLET BY MOUTH AS NEEDED FOR PALPITATIONS (Patient not taking: Reported on 12/12/2018)   No facility-administered encounter medications on file as of 12/12/2018.     ALLERGIES:  Allergies  Allergen Reactions  . Carbamazepine Anaphylaxis  . Lamictal [Lamotrigine] Anaphylaxis    SOB, Lamotrigine only  . Other     STATES HAS SHORTNESS OF BREATH WITH GENERIC DRUGS     ALSO RASH AND ITCHING  . Pseudoephedrine Hcl Er Other (See Comments)    SEIZURES.  . Valium Other (See Comments)    Hallucinations, spasticity, hyper-relfexive   . Amlodipine Rash     Face swell  . Oxycodone Nausea And Vomiting and Rash  . Prednisone Rash and Other (See Comments)    Prescribing MD told patient he thinks dose (dose pack) was "just too high." 03/09/11   STATES HAS TAKEN SMALLER DOSE AND STILL HAD RASH AND JITTERINESS  . Ibuprofen Swelling  . Percocet [Oxycodone-Acetaminophen] Nausea And Vomiting  . Betadine [Povidone Iodine] Rash  . Latex Dermatitis and Rash  . Shellfish Allergy Swelling     PHYSICAL EXAM:  ECOG Performance status: 1  Vitals:   12/12/18 0953  BP: 116/80  Pulse: 75  Resp: 14  Temp: 97.7 F (36.5 C)  SpO2: 98%   Filed Weights   12/12/18 0953  Weight: 132 lb (59.9 kg)    Physical Exam Constitutional:      Appearance: Normal appearance. She is normal weight.  Cardiovascular:     Rate and Rhythm: Normal rate and regular rhythm.     Heart sounds: Normal heart sounds.  Pulmonary:     Effort: Pulmonary effort is normal.     Breath sounds: Normal breath sounds.  Abdominal:     General: Bowel sounds are normal.     Palpations: Abdomen is soft.  Musculoskeletal: Normal range of motion.  Skin:    General: Skin is warm and dry.  Neurological:     Mental Status: She is alert and oriented to person, place, and time. Mental status is at baseline.  Psychiatric:        Mood and Affect: Mood normal.        Behavior: Behavior normal.        Thought Content: Thought content normal.        Judgment: Judgment normal.   Breast: LEFT: No palpable masses, no skin changes or nipple discharge, no adenopathy. RIGHT: Mastectomy site well-healed.  No palpable masses or adenopathy.  LABORATORY DATA:  I have reviewed the labs as listed.  CBC    Component Value Date/Time   WBC 8.1 11/26/2018 1126   WBC 6.4 11/20/2018 1101   RBC  5.53 (H) 11/26/2018 1126   RBC 5.33 (H) 11/20/2018 1101   HGB 17.1 (H) 11/26/2018 1126   HGB 16.4 (H) 12/28/2009 0941   HCT 49.9 (H) 11/26/2018 1126   HCT 47.1 (H) 12/28/2009 0941   PLT 192 11/26/2018 1126    MCV 90 11/26/2018 1126   MCV 92.8 12/28/2009 0941   MCH 30.9 11/26/2018 1126   MCH 30.8 11/20/2018 1101   MCHC 34.3 11/26/2018 1126   MCHC 34.0 11/20/2018 1101   RDW 12.8 11/26/2018 1126   RDW 12.8 12/28/2009 0941   LYMPHSABS 3.5 (H) 11/26/2018 1126   LYMPHSABS 2.0 12/28/2009 0941   MONOABS 0.5 11/20/2018 1101   MONOABS 0.6 12/28/2009 0941   EOSABS 0.2 11/26/2018 1126   BASOSABS 0.1 11/26/2018 1126   BASOSABS 0.1 12/28/2009 0941   CMP Latest Ref Rng & Units 11/26/2018 11/20/2018 04/06/2018  Glucose 65 - 99 mg/dL 84 111(H) 89  BUN 6 - 24 mg/dL 8 14 12   Creatinine 0.57 - 1.00 mg/dL 0.78 0.73 0.81  Sodium 134 - 144 mmol/L 143 141 144  Potassium 3.5 - 5.2 mmol/L 5.2 4.4 4.3  Chloride 96 - 106 mmol/L 104 106 105  CO2 20 - 29 mmol/L 26 26 23   Calcium 8.7 - 10.2 mg/dL 9.2 8.7(L) 9.0  Total Protein 6.0 - 8.5 g/dL 6.5 6.7 -  Total Bilirubin 0.0 - 1.2 mg/dL 0.3 0.4 -  Alkaline Phos 39 - 117 IU/L 120(H) 99 -  AST 0 - 40 IU/L 15 21 -  ALT 0 - 32 IU/L 18 24 -    I personally performed a face-to-face visit.  All questions were answered to patient's stated satisfaction. Encouraged patient to call with any new concerns or questions before his next visit to the cancer center and we can certain see him sooner, if needed.     ASSESSMENT & PLAN:   Cancer of right breast ([place]) 1.  Stage Ia right breast cancer: - She was diagnosed 12/2008 with invasive ductal carcinoma, ER+/PR+/HER-2 negative. -She underwent a right mastectomy with sentinel lymph node biopsy demonstrating 1.7 cm invasive disease, 0/3 sentinel lymph nodes were positive. -She received 3 cycles of Taxotere/Cytoxan chemotherapy. -She was placed on antiestrogen therapy however she was unable to tolerate it. -She is on surveillance alone at this time. - Patient's last mammogram on 10/17/2017 was BI-RADS Category 1 negative -We will schedule her yearly mammogram for next week.   -Labs on 11/26/2018 shows her potassium 5.2, creatinine  0.78, WBC 8.1, hemoglobin 17.1, platelets 192. -She will follow-up in 1 year with repeat labs and mammogram.  2.  Elevated hemoglobin: - It is documented that she has had elevated hemoglobin since 2010. -This is due to her smoking the majority of her life she smokes half a pack to 1 pack a day. - Jak 2 testing was negative. - She denies any symptoms such as headaches, dizziness, fatigue, or aquagenic pruritus. -Labs on 11/26/2018 showed her hemoglobin 17.1 and hematocrit 49.9 -She was reeducated on smoking and the effects on her body in her hemoglobin.  She reports she will try to wean herself off. - She will return in 6 months for a lab check on her hemoglobin.  3.  Smoking history: - Patient is enrolled in the lung cancer screening program. -Patient had a low-dose CT scan on 11/27/2017 which was lung RADS 2. - Her follow-up low-dose CT scan will be in a few weeks.      Orders placed this encounter:  Orders Placed  This Encounter  Procedures  . MM Digital Screening Unilat L  . CBC with Differential/Platelet  . Comprehensive metabolic panel  . Lactate dehydrogenase  . CBC with Differential/Platelet  . Comprehensive metabolic panel  . Vitamin B12  . VITAMIN D 25 Hydroxy (Vit-D Deficiency, Fractures)     Francene Finders, FNP-C [facility] [phone]

## 2018-12-12 NOTE — Assessment & Plan Note (Addendum)
1.  Stage Ia right breast cancer: - She was diagnosed 12/2008 with invasive ductal carcinoma, ER+/PR+/HER-2 negative. -She underwent a right mastectomy with sentinel lymph node biopsy demonstrating 1.7 cm invasive disease, 0/3 sentinel lymph nodes were positive. -She received 3 cycles of Taxotere/Cytoxan chemotherapy. -She was placed on antiestrogen therapy however she was unable to tolerate it. -She is on surveillance alone at this time. - Patient's last mammogram on 10/17/2017 was BI-RADS Category 1 negative -We will schedule her yearly mammogram for next week.   -Labs on 11/26/2018 shows her potassium 5.2, creatinine 0.78, WBC 8.1, hemoglobin 17.1, platelets 192. -She will follow-up in 1 year with repeat labs and mammogram.  2.  Elevated hemoglobin: - It is documented that she has had elevated hemoglobin since 2010. -This is due to her smoking the majority of her life she smokes half a pack to 1 pack a day. - Jak 2 testing was negative. - She denies any symptoms such as headaches, dizziness, fatigue, or aquagenic pruritus. -Labs on 11/26/2018 showed her hemoglobin 17.1 and hematocrit 49.9 -She was reeducated on smoking and the effects on her body in her hemoglobin.  She reports she will try to wean herself off. - She will return in 6 months for a lab check on her hemoglobin.  3.  Smoking history: - Patient is enrolled in the lung cancer screening program. -Patient had a low-dose CT scan on 11/27/2017 which was lung RADS 2. - Her follow-up low-dose CT scan will be in a few weeks.

## 2018-12-12 NOTE — Patient Instructions (Signed)
[facility] at [facility] Discharge Instructions  Follow up in 1 year with labs and mammogram prior   Thank you for choosing [facility] at [facility] [address] to provide your oncology and hematology care.  To afford each patient quality time with our provider, please arrive at least 15 minutes before your scheduled appointment time.   If you have a lab appointment with the [facility] please come in thru the Main Entrance and check in at the main information desk.  You need to re-schedule your appointment should you arrive 10 or more minutes late.  We strive to give you quality time with our providers, and arriving late affects you and other patients whose appointments are after yours.  Also, if you no show three or more times for appointments you may be dismissed from the clinic at the providers discretion.     Again, thank you for choosing [facility].  Our hope is that these requests will decrease the amount of time that you wait before being seen by our physicians.       _____________________________________________________________  Should you have questions after your visit to [facility], please contact our office at [phone] between the hours of 8:00 a.m. and 4:30 p.m.  Voicemails left after 4:00 p.m. will not be returned until the following business day.  For prescription refill requests, have your pharmacy contact our office and allow 72 hours.    Due to Covid, you will need to wear a mask upon entering the hospital. If you do not have a mask, a mask will be given to you at the Main Entrance upon arrival. For doctor visits, patients may have 1 support person with them. For treatment visits, patients can not have anyone with them due to social distancing guidelines and our immunocompromised population.

## 2018-12-17 ENCOUNTER — Ambulatory Visit (HOSPITAL_COMMUNITY)
Admission: RE | Admit: 2018-12-17 | Discharge: 2018-12-17 | Disposition: A | Discharge status: Home / Self Care | Payer: Medicare Other | Source: Ambulatory Visit | Attending: Nurse Practitioner | Admitting: Nurse Practitioner

## 2018-12-17 ENCOUNTER — Other Ambulatory Visit: Payer: Self-pay

## 2018-12-17 DIAGNOSIS — Z1231 Encounter for screening mammogram for malignant neoplasm of breast: Secondary | ICD-10-CM | POA: Diagnosis not present

## 2018-12-17 DIAGNOSIS — Z23 Encounter for immunization: Secondary | ICD-10-CM | POA: Diagnosis not present

## 2018-12-18 ENCOUNTER — Telehealth: Payer: Self-pay | Admitting: Neurology

## 2018-12-18 DIAGNOSIS — Z5181 Encounter for therapeutic drug level monitoring: Secondary | ICD-10-CM

## 2018-12-18 NOTE — Telephone Encounter (Signed)
I called the patient.  The patient to come in in the morning sometime this week and get her Dilantin level drawn.  We have recently gone up on the dosing of the Dilantin.

## 2018-12-27 ENCOUNTER — Other Ambulatory Visit: Payer: Self-pay

## 2018-12-27 ENCOUNTER — Other Ambulatory Visit (INDEPENDENT_AMBULATORY_CARE_PROVIDER_SITE_OTHER): Payer: Self-pay

## 2018-12-27 DIAGNOSIS — Z5181 Encounter for therapeutic drug level monitoring: Secondary | ICD-10-CM | POA: Diagnosis not present

## 2018-12-27 DIAGNOSIS — Z0289 Encounter for other administrative examinations: Secondary | ICD-10-CM

## 2018-12-28 LAB — PHENYTOIN LEVEL, TOTAL: Phenytoin (Dilantin), Serum: 18.7 ug/mL (ref 10.0–20.0)

## 2019-01-01 ENCOUNTER — Inpatient Hospital Stay: Admission: RE | Admit: 2019-01-01 | Payer: Medicare Other | Source: Ambulatory Visit

## 2019-01-29 ENCOUNTER — Inpatient Hospital Stay: Admission: RE | Admit: 2019-01-29 | Payer: Medicare Other | Source: Ambulatory Visit

## 2019-02-08 ENCOUNTER — Ambulatory Visit (INDEPENDENT_AMBULATORY_CARE_PROVIDER_SITE_OTHER): Payer: Medicare Other | Admitting: Cardiovascular Disease

## 2019-02-08 ENCOUNTER — Encounter: Payer: Self-pay | Admitting: Cardiovascular Disease

## 2019-02-08 ENCOUNTER — Other Ambulatory Visit: Payer: Self-pay

## 2019-02-08 VITALS — BP 137/86 | HR 66 | Ht 62.0 in | Wt 131.6 lb

## 2019-02-08 DIAGNOSIS — Z720 Tobacco use: Secondary | ICD-10-CM

## 2019-02-08 DIAGNOSIS — I10 Essential (primary) hypertension: Secondary | ICD-10-CM | POA: Diagnosis not present

## 2019-02-08 DIAGNOSIS — R0789 Other chest pain: Secondary | ICD-10-CM

## 2019-02-08 DIAGNOSIS — G40909 Epilepsy, unspecified, not intractable, without status epilepticus: Secondary | ICD-10-CM

## 2019-02-08 DIAGNOSIS — R002 Palpitations: Secondary | ICD-10-CM | POA: Diagnosis not present

## 2019-02-08 NOTE — Progress Notes (Signed)
Patient ID: Wendy Lopez, female   DOB: Apr 14, 1961, 58 y.o.   MRN: [id number]     HPI: Wendy Lopez is a 58 y.o. female who presents for a 3 month cardiology follow-up evaluation.   Ms. Wendy Lopez has a long-standing history of tobacco abuse and started smoking at age 69.  In 2009 cardiac catheterization revealed normal coronary arteries. In February 2012 a nuclear perfusion study was done after she experienced episodes of chest pain and shortness of breath and this revealed normal perfusion. An echo Doppler study demonstrated mild pulmonary hypertension with estimated RV systolic pressure of 36 mm. Because of issues of recurrent shortness of breath she underwent a cardiopulmonary met test and was found to have a blunted chronotropic response to exercise which is making it difficult for her to improve her endurance and exercise capacity. She has a history of tachypalpitations. She has a history of invasive ductal carcinoma and is status post right mastectomy with sentinel node biopsy and is status post chemotherapy. She has a history of vitamin D insufficiency mild blood pressure elevation. There is also a history of depression.  On 10/16/2012 , a 2-D echo Doppler study  revealed an ejection fraction in the range of 60-65%. She did not have wall motion abnormalities and had normal diastolic function. There was evidence for right ventricular hypertrophy with normal RV function. She again had mild pulmonary hypertension with an estimated pressure 39 mm. There is moderate tricuspid regurgitation. Ms. Wendy Lopez continues to smoke cigarettes. She does note some indigestion. He also has noticed some left leg weakness and has issues with L4-L5 disc disease. She previously was on a higher dose of present Brintellex but due to tremors she has reduced his dose to 5 mg daily.  A subsequent nuclear perfusion study was done on 11/20/2012 after her ECG revealed slight additional T-wave abnormalities and was  essentially normal without wall motion abnormality, scar or ischemia.  She has a history of cervical disc disease and had noted some upper back discomfort in the past.  In December 2015, her EKG had shown  more pronounced downsloping ST segment depression in the inferior to inferolateral leads.  I recommended a subsequent nuclear perfusion study.  This was done on 04/17/2014 , which was not significant change from her previous study.  She again had basal inferolateral ST-T changes with T-wave inversion in V3 through V4 which did not significant change with stress.  She had normal perfusion with post-rest ejection fraction at 65%.  When seen in 2016 she was smoking one half pack of cigarettes per day.  She had had medication issues and was on Keppra in place of Lamictal for seizures due to cost. Recently, she  had some issue with metoprolol succinate.  She had self reduced this to 100 mg per day.  When I saw her several months ago.  She was wanting to switch to a shorter acting version, however, noted more palpitations on the tartrate preparation. As result, she put herself back on long-acting Toprol.  I saw her in October 2018 and over the 2 years previously she was  without anginal symptoms.  She quit smoking in December 2017, but unfortunately resumed during the period of increased stress 4 months later.  Unfortunately she was still smoking cigarettes.  She was no longer having caffeine or chocolate.  She denies any exertional chest pain.  Her palpitations have been fairly well controlled on Toprol 200 mg daily.  She denied seizure activity and is on Keppra  and Dilantin.    She has undergone several additional evaluations with Wendy Drown, NP and Wendy Lopez, [place] in November and December 2019.  She had experienced intermittent midsternal chest pain/tightness without radiation which she described to Wendy Drown, NP on 02/16/2018.  She subsequently underwent a Lexiscan Myoview which was normal.  EF was  greater than 65%.  Because of potential lower extremity claudication issues on March 06, 2018 lower semi-Doppler imaging revealed normal ABIs bilaterally.  An echo Doppler study November 2019 showed an EF of 55 to 60% without wall motion abnormalities.  She had a mildly thickened mitral valve without frank prolapse.  She was seen by Wendy Deforest, PA in follow-up.  She also underwent nerve conduction studies by Wendy Lopez and she does not have peripheral neuropathy.  She continues to have issues with her neck and since Dr. Carloyn Lopez has retired she is now seeing Wendy Lopez.  Unfortunately she still smoking 1/2 to 1 pack/day.  She admits to occasional palpitations at night with heart rates going up into the 110 range.  Apparently she had had a home study for evaluation of potential sleep apnea by Dr. Deforest Wendy Lopez.  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  She wore an event monitor from July 30 through November 28, 2018.  She was in sinus rhythm with an average rate at 70 bpm.  The fastest heart rate was sinus tachycardia at 112 bpm.  She did not have any episodes of atrial fibrillation, SVT, or prolonged bradycardia or ectopy.  She continues to smoke cigarettes.  She does undergo yearly low-dose CT imaging for lung cancer screening.  She presents for evaluation.    Past Medical History:  Diagnosis Date   Anxiety and depression    Arthritis    Breast cancer ([place]) 02/13/2009   Cancer of right breast ([place]) 04/16/2012   Depression    Depression 2014   Dysrhythmia    palpitations   Epilepsy ([place])    last seizure 1990's   H/O hiatal hernia    Localization-related (focal) (partial) epilepsy and epileptic syndromes with complex partial seizures, without mention of intractable epilepsy 02/07/2013   Memory deficit 02/07/2013   Memory loss    Mitral valve prolapse 06/01/10   echo-  EF>55% mild tricupsid regurgitation. There is mild Pulmonary Hypertension.. Right  ventricular systolic pressure iselevated at 30-40 mmHg. LV systolic function is normal.   Osteopenia    PONV (postoperative nausea and vomiting)    SOB (shortness of breath) 10/18/11   met-test   Spinal fusion failure    3    Past Surgical History:  Procedure Laterality Date   BACK SURGERY     screws- lumbar   BREAST SURGERY  2010   mastectomy - RIGHT   COLONOSCOPY WITH PROPOFOL  03/13/2012   Procedure: COLONOSCOPY WITH PROPOFOL;  Surgeon: Garlan Fair, MD;  Location: [facility];  Service: Endoscopy;  Laterality: N/A;   ESOPHAGOGASTRODUODENOSCOPY  03/13/2012   Procedure: ESOPHAGOGASTRODUODENOSCOPY (EGD);  Surgeon: Garlan Fair, MD;  Location: [facility];  Service: Endoscopy;  Laterality: N/A;  Reflux, Hiatal Hernia   HAND TENDON SURGERY  2012   tendons released in right hand    MASTECTOMY, RADICAL  02/13/2009   SPINE SURGERY  1999, 2003, 2005   TONSILLECTOMY      Allergies  Allergen Reactions   Carbamazepine Anaphylaxis   Lamictal [  Lamotrigine] Anaphylaxis    SOB, Lamotrigine only   Other     STATES HAS SHORTNESS OF BREATH WITH GENERIC DRUGS     ALSO RASH AND ITCHING   Pseudoephedrine Hcl Er Other (See Comments)    SEIZURES.   Valium Other (See Comments)    Hallucinations, spasticity, hyper-relfexive    Amlodipine Rash    Face swell   Oxycodone Nausea And Vomiting and Rash   Prednisone Rash and Other (See Comments)    Prescribing MD told patient he thinks dose (dose pack) was "just too high." 03/09/11   STATES HAS TAKEN SMALLER DOSE AND STILL HAD RASH AND JITTERINESS   Ibuprofen Swelling   Percocet [Oxycodone-Acetaminophen] Nausea And Vomiting   Betadine [Povidone Iodine] Rash   Latex Dermatitis and Rash   Shellfish Allergy Swelling    Current Outpatient Medications  Medication Sig Dispense Refill   aspirin-acetaminophen-caffeine (EXCEDRIN EXTRA  STRENGTH) 250-250-65 MG per tablet Take 1 tablet by mouth every 6 (six) hours as needed. Pain     cetirizine (ZYRTEC) 10 MG tablet Take 10 mg by mouth daily.     cyanocobalamin 100 MCG tablet Take 100 mcg by mouth daily.     DILANTIN 100 MG ER capsule Take 1 capsule (100 mg total) by mouth 3 (three) times daily. 270 capsule 3   levETIRAcetam (KEPPRA) 500 MG tablet One tablet in the morning and 2 in the evening 270 tablet 3   metoprolol succinate (TOPROL-XL) 100 MG 24 hr tablet Take 1 tablet (100 mg total) by mouth 2 (two) times daily. Take with or immediately following a meal. 180 tablet 2   Misc. Devices MISC Please provide patient with right breast prosthesis and 6 mastectomy bras sue to a history of right breast cancer and mastectomy. (ICD10- C50.111) 6 each 0   phenytoin (DILANTIN) 30 MG ER capsule Take 1 capsule (30 mg total) by mouth daily. 90 capsule 1   Vitamin D, Ergocalciferol, (DRISDOL) 1.25 MG (50000 UT) CAPS capsule Take 50,000 Units by mouth every 7 (seven) days.     No current facility-administered medications for this visit.     Social History   Socioeconomic History   Marital status: Married    Spouse name: Not on file   Number of children: 1   Years of education: college 2   Highest education level: Not on file  Occupational History   Occupation: unemployed    Fish farm manager: UNEMPLOYED    Comment: 2003  Social Designer, fashion/clothing strain: Not on file   Food insecurity    Worry: Not on file    Inability: Not on file   Transportation needs    Medical: Not on file    Non-medical: Not on file  Tobacco Use   Smoking status: Current Every Day Smoker    Packs/day: 1.00    Years: 43.00    Pack years: 43.00    Types: Cigarettes   Smokeless tobacco: Never Used   Tobacco comment: 1/2 pk per day  Substance and Sexual Activity   Alcohol use: No    Alcohol/week: 0.0 standard drinks   Drug use: No   Sexual activity: Not on file  Lifestyle    Physical activity    Days per week: Not on file    Minutes per session: Not on file   Stress: Not on file  Relationships   Social connections    Talks on phone: Not on file    Gets together: Not on file  Attends religious service: Not on file    Active member of club or organization: Not on file    Attends meetings of clubs or organizations: Not on file    Relationship status: Not on file   Intimate partner violence    Fear of current or ex partner: Not on file    Emotionally abused: Not on file    Physically abused: Not on file    Forced sexual activity: Not on file  Other Topics Concern   Not on file  Social History Narrative   Patient is married with one child.   Patient is right handed.   Patient has 2 yrs of education.   Patient does not drink caffeine.    Social she is married and has one child. She has been smoking since age 65.  ROS General: Negative; No fevers, chills, or night sweats;  HEENT: Negative; No changes in vision or hearing, sinus congestion, difficulty swallowing Pulmonary: Negative; No cough, wheezing, shortness of breath, hemoptysis Cardiovascular: See HPI GI: Negative; No nausea, vomiting, diarrhea, or abdominal pain GU: Negative; No dysuria, hematuria, or difficulty voiding Musculoskeletal: Neck and leg discomfort Hematologic/Oncology: Remote history of stage I breast CA of the right breast with recent evaluation felt to be stable. Endocrine: Negative; no heat/cold intolerance; no diabetes Neuro: Remote history of seizure disorder for which she had been on chronic Lamictal and Dilantin and is followed now by Dr. Jannifer Franklin; previously she had been followed by Dr. Erling Cruz.  She was switched to [place] due to cost since she could not take generic Lamictal.  Intermittent paresthesias in her extremities   Skin: Negative; No rashes or skin lesions Psychiatric: Negative; No behavioral problems, depression Sleep: Fatigability:  No snoring, daytime sleepiness,  hypersomnolence, bruxism, restless legs, hypnogognic hallucinations, no cataplexy Other comprehensive 14 point system review is negative.   PE BP 137/86    Pulse 66    Ht _0  (1.575 m)    Wt 131 lb 9.6 oz (59.7 kg)    LMP 11/03/1993    SpO2 93%    BMI 24.07 kg/m    Repeat blood pressure by me was 130/80  Wt Readings from Last 3 Encounters:  02/08/19 131 lb 9.6 oz (59.7 kg)  12/12/18 132 lb (59.9 kg)  11/26/18 133 lb (60.3 kg)   General: Alert, oriented, no distress.  Skin: normal turgor, no rashes, warm and dry HEENT: Normocephalic, atraumatic. Pupils equal round and reactive to light; sclera anicteric; extraocular muscles intact;  Nose without nasal septal hypertrophy Mouth/Parynx benign; Mallinpatti scale 2 Neck: No JVD, no carotid bruits; normal carotid upstroke Lungs: clear to ausculatation and percussion; no wheezing or rales Chest wall: without tenderness to palpitation Heart: PMI not displaced, RRR, s1 s2 normal, 1/6 systolic murmur, no diastolic murmur, no rubs, gallops, thrills, or heaves Abdomen: soft, nontender; no hepatosplenomehaly, BS+; abdominal aorta nontender and not dilated by palpation. Back: no CVA tenderness Pulses 2+ Musculoskeletal: full range of motion, normal strength, no joint deformities Extremities: no clubbing cyanosis or edema, Homan's sign negative  Neurologic: grossly nonfocal; Cranial nerves grossly wnl Psychologic: Normal mood and affect   ECG (independently read by me): Normal sinus rhythm at 66 bpm, the left atrial enlargement.  Previous inferior and anterolateral ST changes  November 07, 2018 ECG (independently read by me): Normal sinus rhythm at 69 bpm.  Possible left atrial enlargement.  Inferolateral T wave abnormality  October 2018 ECG (independently read by me): Normal sinus rhythm at 69 bpm.  Probable  left atrial enlargement.  Inferolateral ST changes.  Normal intervals.  No ectopy.  June 2017 ECG (independently read by me): Normal sinus  rhythm at 71 bpm.  Will biatrial enlargement.  Inferolateral ST-T wave abnormality.  Normal intervals.  March 2017 ECG (independently read by me):  Normal sinus rhythm at 77 bpm.  Biatrial enlargement.  Previously noted inferolateral T wave abnormalities.  November 2016 ECG (independently read by me): Normal sinus rhythm at 75 bpm.  Inferolateral ST-T wave abnormalities.  03/31/2014 ECG (independently read by me) : Normal sinus rhythm at 75 bpm.  There is now significantly more pronounced downsloping ST segment depression in leads II, III, and F V4 through V6 and T-wave inversion in V3 compared to her prior ECG of over one year ago.  Prior ECG: Normal sinus rhythm at 68. T-wave abnormalities  inferiorly in V3 through V6  LABS:   BMP Latest Ref Rng & Units 11/26/2018 11/20/2018 04/06/2018  Glucose 65 - 99 mg/dL 84 111(H) 89  BUN 6 - 24 mg/dL _0 Creatinine 0.57 - 1.00 mg/dL 0.78 0.73 0.81  BUN/Creat Ratio 9 - 23 10 - 15  Sodium 134 - 144 mmol/L 143 141 144  Potassium 3.5 - 5.2 mmol/L 5.2 4.4 4.3  Chloride 96 - 106 mmol/L 104 106 105  CO2 20 - 29 mmol/L _1 Calcium 8.7 - 10.2 mg/dL 9.2 8.7(L) 9.0    Hepatic Function Latest Ref Rng & Units 11/26/2018 11/20/2018 10/24/2017  Total Protein 6.0 - 8.5 g/dL 6.5 6.7 7.2  Albumin 3.8 - 4.9 g/dL 4.4 3.9 4.3  AST 0 - 40 IU/L _2 ALT 0 - 32 IU/L _3 Alk Phosphatase 39 - 117 IU/L 120(H) 99 117  Total Bilirubin 0.0 - 1.2 mg/dL 0.3 0.4 0.5    CBC Latest Ref Rng & Units 11/26/2018 11/20/2018 04/06/2018  WBC 3.4 - 10.8 x10E3/uL 8.1 6.4 8.1  Hemoglobin 11.1 - 15.9 g/dL 17.1(H) 16.4(H) 16.4(H)  Hematocrit 34.0 - 46.6 % 49.9(H) 48.3(H) 44.7  Platelets 150 - 450 x10E3/uL 192 185 156    Lab Results  Component Value Date   MCV 90 11/26/2018   MCV 90.6 11/20/2018   MCV 87 04/06/2018    Lab Results  Component Value Date   TSH 0.842 04/06/2018     Lab Results  Component Value Date   MCV 90 11/26/2018   MCV 90.6 11/20/2018    MCV 87 04/06/2018   Lab Results  Component Value Date   TSH 0.842 04/06/2018  No results found for: HGBA1C   Lipid Panel     Component Value Date/Time   CHOL 170 02/17/2017 0820   TRIG 65 02/17/2017 0820   HDL 70 02/17/2017 0820   CHOLHDL 2.4 02/17/2017 0820   LDLCALC 87 02/17/2017 0820    RADIOLOGY: No results found.  IMPRESSION:  1. Palpitations   2. Essential hypertension   3. Tobacco abuse   4. Seizure disorder ([place])   5. Atypical chest pain     ASSESSMENT AND PLAN: Ms. Amalya Salmons is a 58 year old female who has a long-standing tobacco history of greater than 43 years.  In January 2009  cardiac catheterization at [facility] revealed normal coronary arteries. A nuclear perfusion study in 2012 showed normal perfusion. A cardiopulmonary met test in July 2013 showed blunted chronotropic response without ischemic changes but she had reduced functional status with peak O2 at 69% of predicted. She has mild pulmonary  hypertension.  An echo revealed normal function without wall motion abnormalities despite her ECG changes.  A subsequent stress test in 2015 which was done because of more abnormal ST-T changes remain stable. A repeat echo Doppler study in November 2016 was unchanged and showed an EF of 60-65%.  There was trivial TR and MR.  Pulmonary pressures were normal.  In November 2019 due to recurrent intermittent episodes of chest tightness a Lexiscan Myoview study continued to show normal perfusion and was low risk.  She has been on Toprol-XL 100 mg twice a day with her history of increased heart rates.  I reviewed her most recent event monitor from July 30 through November 28, 2018.  This revealed predominant sinus rhythm with average heart beat at 70 bpm.  There was mild sinus bradycardia which occurred during sleep.  Her fastest heart rate was sinus tachycardia at 112 bpm at 7 PM while active.  She did not have any episodes of AF, SVT ectopy or prolonged  bradycardia.  Her blood pressure today is stable on current therapy.  Dr. Jannifer Franklin now feels that her neuropathy most likely is due to her prior chemotherapy for which she had undergone for her breast cancer almost 10 years ago.  I again discussed the importance of absolute smoking cessation.  I reviewed recent laboratory.  She undergoes screening surveillance with CT imaging in light of her longstanding tobacco history.  She is not having any anginal symptoms.  I will see her in 1 year for reevaluation  She has been on Toprol-XL 200 mg daily and recently has noticed episodes of increasing heart rate.  These typically have been occurring at night and her primary physician apparently had scheduled her for a home sleep study, results are still pending.  Her blood pressure today is mildly increased in stage I hypertension based on most recent hypertensive guidelines.  I am giving her a prescription initially for diltiazem 30 mg to take on a as needed basis.  I am scheduling her for a 2-week event monitor for further evaluation of her heart rate and rhythm.  I again discussed the importance of complete smoking cessation.  She continues to be on Keppra and Dilantin and has not had recent seizure activity.  I reviewed the records of Dr. Jannifer Franklin and she was not found to have peripheral neuropathy.  I will see her in 2 to 3 months for reevaluation and further recommendations were made at that time.  Time spent: 25 minutes Troy Sine, MD, [facility]  02/10/2019 2:31 PM

## 2019-02-08 NOTE — Patient Instructions (Signed)
Follow-Up: IN 12 months Please call our office 2 months in advance, AUG 2021 to schedule this OCT 2021 appointment. In Person You may see Shelva Majestic, MD or one of the following Advanced Practice Providers on your designated Care Team:  Rosaria Ferries, PA-C Jory Sims, DNP, ANP  Cadence Kathlen Mody, NP.    Medication Instructions:  The current medical regimen is effective;  continue present plan and medications as directed. Please refer to the Current Medication list given to you today. If you need a refill on your cardiac medications before your next appointment, please call your pharmacy.  At [facility], you and your health needs are our priority.  As part of our continuing mission to provide you with exceptional heart care, we have created designated Provider Care Teams.  These Care Teams include your primary Cardiologist (physician) and Advanced Practice Providers (APPs -  Physician Assistants and Nurse Practitioners) who all work together to provide you with the care you need, when you need it.  Thank you for choosing [facility] at [facility]!!

## 2019-02-10 ENCOUNTER — Encounter: Payer: Self-pay | Admitting: Cardiovascular Disease

## 2019-05-29 ENCOUNTER — Other Ambulatory Visit: Payer: Self-pay | Admitting: Neurology

## 2019-06-05 ENCOUNTER — Encounter: Payer: Self-pay | Admitting: Neurology

## 2019-06-05 ENCOUNTER — Other Ambulatory Visit: Payer: Self-pay

## 2019-06-05 ENCOUNTER — Ambulatory Visit (INDEPENDENT_AMBULATORY_CARE_PROVIDER_SITE_OTHER): Payer: Medicare Other | Admitting: Neurology

## 2019-06-05 VITALS — BP 132/77 | HR 64 | Temp 97.0°F | Ht 62.0 in | Wt 132.0 lb

## 2019-06-05 DIAGNOSIS — G40909 Epilepsy, unspecified, not intractable, without status epilepticus: Secondary | ICD-10-CM

## 2019-06-05 NOTE — Progress Notes (Signed)
I have read the note, and I agree with the clinical assessment and plan.  Cerina Leary K Lumi Winslett   

## 2019-06-05 NOTE — Patient Instructions (Signed)
Continue current medications, if you have a hard time getting the correct manufacturer, please let us know if we need to try and get Brand NAME. I will check lab work today. See you back in 6 months

## 2019-06-05 NOTE — Progress Notes (Signed)
PATIENT: Wendy Lopez DOB: 1960/08/30  REASON FOR VISIT: follow up HISTORY FROM: patient  HISTORY OF PRESENT ILLNESS: Today 06/05/19  Wendy Lopez is a 59 year old female with history of seizures.  When last seen, she had likely seizure episode, when she had awakened and had bitten her tongue.  Her Dilantin level was increased with a 30 mg capsule, she remains on Keppra.  She indicates since increasing her dose of Dilantin, she is actually feeling a lot better, has been having more energy, she is very pleased.  She is taking generic Keppra, has a Licensed conveyancer (Wendy Lopez) that works for her.  She says she is highly allergic to a common filler in generic medications.  Her allergy has been so significant, at times has had to use EpiPen.  She has not had recurrent seizure.  She is working with pharmacy, unsure, how much more medication she will be able to receive from this manufacturer.  In the past, she has been on brand-name Keppra, but insurance did not pay.  She presents today for follow-up unaccompanied.  HISTORY 11/26/2018 Dr. Jannifer Franklin: [name] is a 59 year old right-handed white female with a history of seizures who has been relatively well controlled on Dilantin and Keppra.  Two weeks ago however, she awakened and had bitten her tongue, she does not know for sure whether she had a seizure or not.  The patient continues to have migratory paresthesias in all 4 extremities and has had a recent MRI of the cervical spine done by Dr. Christella Noa.  She was told that this study did not explain her sensory symptoms.  The patient however began having sensory complaints while on Taxol and this problem has continued for 10 years or more.  The patient returns to this office for an evaluation.   REVIEW OF SYSTEMS: Out of a complete 14 system review of symptoms, the patient complains only of the following symptoms, and all other reviewed systems are negative.  Seizures  ALLERGIES: Allergies    Allergen Reactions   Carbamazepine Anaphylaxis   Lamictal [Lamotrigine] Anaphylaxis    SOB, Lamotrigine only   Other     STATES HAS SHORTNESS OF BREATH WITH GENERIC DRUGS     ALSO RASH AND ITCHING   Pseudoephedrine Hcl Er Other (See Comments)    SEIZURES.   Valium Other (See Comments)    Hallucinations, spasticity, hyper-relfexive    Amlodipine Rash    Face swell   Oxycodone Nausea And Vomiting and Rash   Prednisone Rash and Other (See Comments)    Prescribing MD told patient he thinks dose (dose pack) was "just too high." 03/09/11   STATES HAS TAKEN SMALLER DOSE AND STILL HAD RASH AND JITTERINESS   Ibuprofen Swelling   Percocet [Oxycodone-Acetaminophen] Nausea And Vomiting   Betadine [Povidone Iodine] Rash   Latex Dermatitis and Rash   Shellfish Allergy Swelling    HOME MEDICATIONS: Outpatient Medications Prior to Visit  Medication Sig Dispense Refill   aspirin-acetaminophen-caffeine (EXCEDRIN EXTRA STRENGTH) 250-250-65 MG per tablet Take 1 tablet by mouth as needed. Pain      cyanocobalamin 100 MCG tablet Take 100 mcg by mouth daily.     DILANTIN 100 MG ER capsule Take 1 capsule (100 mg total) by mouth 3 (three) times daily. 270 capsule 3   DILANTIN 30 MG ER capsule TAKE 1 CAPSULE(30 MG) BY MOUTH DAILY 90 capsule 1   levETIRAcetam (KEPPRA) 500 MG tablet One tablet in the morning and 2 in the evening 270  tablet 3   metoprolol succinate (TOPROL-XL) 100 MG 24 hr tablet Take 1 tablet (100 mg total) by mouth 2 (two) times daily. Take with or immediately following a meal. (Patient taking differently: Take 100 mg by mouth daily. Take with or immediately following a meal.) 180 tablet 2   Misc. Devices MISC Please provide patient with right breast prosthesis and 6 mastectomy bras sue to a history of right breast cancer and mastectomy. (ICD10- C50.111) 6 each 0   Vitamin D, Ergocalciferol, (DRISDOL) 1.25 MG (50000 UT) CAPS capsule Take 50,000 Units by mouth every 7  (seven) days.     cetirizine (ZYRTEC) 10 MG tablet Take 10 mg by mouth daily.     No facility-administered medications prior to visit.    PAST MEDICAL HISTORY: Past Medical History:  Diagnosis Date   Anxiety and depression    Arthritis    Breast cancer ([place]) 02/13/2009   Cancer of right breast ([place]) 04/16/2012   Depression    Depression 2014   Dysrhythmia    palpitations   Epilepsy ([place])    last seizure 1990's   H/O hiatal hernia    Localization-related (focal) (partial) epilepsy and epileptic syndromes with complex partial seizures, without mention of intractable epilepsy 02/07/2013   Memory deficit 02/07/2013   Memory loss    Mitral valve prolapse 06/01/10   echo- EF>55% mild tricupsid regurgitation. There is mild Pulmonary Hypertension.. Right  ventricular systolic pressure iselevated at 30-40 mmHg. LV systolic function is normal.   Osteopenia    PONV (postoperative nausea and vomiting)    SOB (shortness of breath) 10/18/11   met-test   Spinal fusion failure    3    PAST SURGICAL HISTORY: Past Surgical History:  Procedure Laterality Date   BACK SURGERY     screws- lumbar   BREAST SURGERY  2010   mastectomy - RIGHT   COLONOSCOPY WITH PROPOFOL  03/13/2012   Procedure: COLONOSCOPY WITH PROPOFOL;  Surgeon: Garlan Fair, MD;  Location: [facility];  Service: Endoscopy;  Laterality: N/A;   ESOPHAGOGASTRODUODENOSCOPY  03/13/2012   Procedure: ESOPHAGOGASTRODUODENOSCOPY (EGD);  Surgeon: Garlan Fair, MD;  Location: [facility];  Service: Endoscopy;  Laterality: N/A;  Reflux, Hiatal Hernia   HAND TENDON SURGERY  2012   tendons released in right hand    MASTECTOMY, RADICAL  02/13/2009   SPINE SURGERY  1999, 2003, 2005   TONSILLECTOMY      FAMILY HISTORY: Family History  Problem Relation Age of Onset   Arthritis Mother        rhuematoid   Heart disease Mother        Atrial fib   Heart disease Father    Heart disease Sister         Atrial fib    SOCIAL HISTORY: Social History   Socioeconomic History   Marital status: Married    Spouse name: Not on file   Number of children: 1   Years of education: college 2   Highest education level: Not on file  Occupational History   Occupation: unemployed    Employer: UNEMPLOYED    Comment: 2003  Tobacco Use   Smoking status: Current Every Day Smoker    Packs/day: 1.00    Years: 43.00    Pack years: 43.00    Types: Cigarettes   Smokeless tobacco: Never Used   Tobacco comment: 1/2 pk per day  Substance and Sexual Activity   Alcohol use: No    Alcohol/week: 0.0 standard drinks  Drug use: No   Sexual activity: Not on file  Other Topics Concern   Not on file  Social History Narrative   Patient is married with one child.   Patient is right handed.   Patient has 2 yrs of education.   Patient does not drink caffeine.   Social Determinants of Health   Financial Resource Strain:    Difficulty of Paying Living Expenses: Not on file  Food Insecurity:    Worried About Charity fundraiser in the Last Year: Not on file   [employer] of Food in the Last Year: Not on file  Transportation Needs:    Lack of Transportation (Medical): Not on file   Lack of Transportation (Non-Medical): Not on file  Physical Activity:    Days of Exercise per Week: Not on file   Minutes of Exercise per Session: Not on file  Stress:    Feeling of Stress : Not on file  Social Connections:    Frequency of Communication with Friends and Family: Not on file   Frequency of Social Gatherings with Friends and Family: Not on file   Attends Religious Services: Not on file   Active Member of Clubs or Organizations: Not on file   Attends Archivist Meetings: Not on file   Marital Status: Not on file  Intimate Partner Violence:    Fear of Current or Ex-Partner: Not on file   Emotionally Abused: Not on file   Physically Abused: Not on file   Sexually Abused:  Not on file   PHYSICAL EXAM  Vitals:   06/05/19 1330  BP: 132/77  Pulse: 64  Temp: (!) 97 F (36.1 C)  TempSrc: Oral  Weight: 132 lb (59.9 kg)  Height: 5' 2"  (1.575 m)   Body mass index is 24.14 kg/m.  Generalized: Well developed, in no acute distress   Neurological examination  Mentation: Alert oriented to time, place, history taking. Follows all commands speech and language fluent Cranial nerve II-XII: Pupils were equal round reactive to light. Extraocular movements were full, visual field were full on confrontational test. Facial sensation and strength were normal. Head turning and shoulder shrug were normal and symmetric. Motor: Good strength of all 4 extremities. Sensory: Sensory testing is intact to soft touch on all 4 extremities. No evidence of extinction is noted.  Coordination: Cerebellar testing reveals good finger-nose-finger and heel-to-shin bilaterally.  Gait and station: Gait is normal. Tandem gait is normal. Romberg is negative. No drift is seen.  Reflexes: Deep tendon reflexes are symmetric and normal bilaterally.   DIAGNOSTIC DATA (LABS, IMAGING, TESTING) - I reviewed patient records, labs, notes, testing and imaging myself where available.  Lab Results  Component Value Date   WBC 8.1 11/26/2018   HGB 17.1 (H) 11/26/2018   HCT 49.9 (H) 11/26/2018   MCV 90 11/26/2018   PLT 192 11/26/2018      Component Value Date/Time   NA 143 11/26/2018 1126   K 5.2 11/26/2018 1126   CL 104 11/26/2018 1126   CO2 26 11/26/2018 1126   GLUCOSE 84 11/26/2018 1126   GLUCOSE 111 (H) 11/20/2018 1101   BUN 8 11/26/2018 1126   CREATININE 0.78 11/26/2018 1126   CALCIUM 9.2 11/26/2018 1126   PROT 6.5 11/26/2018 1126   ALBUMIN 4.4 11/26/2018 1126   AST 15 11/26/2018 1126   ALT 18 11/26/2018 1126   ALKPHOS 120 (H) 11/26/2018 1126   BILITOT 0.3 11/26/2018 1126   GFRNONAA 84 11/26/2018 1126  GFRAA 97 11/26/2018 1126   Lab Results  Component Value Date   CHOL 170  02/17/2017   HDL 70 02/17/2017   LDLCALC 87 02/17/2017   TRIG 65 02/17/2017   CHOLHDL 2.4 02/17/2017   No results found for: HGBA1C Lab Results  Component Value Date   VITAMINB12 292 11/21/2017   Lab Results  Component Value Date   TSH 0.842 04/06/2018    ASSESSMENT AND PLAN 59 y.o. year old female  has a past medical history of Anxiety and depression, Arthritis, Breast cancer ([place]) (02/13/2009), Cancer of right breast ([place]) (04/16/2012), Depression, Depression (2014), Dysrhythmia, Epilepsy ([place]), H/O hiatal hernia, Localization-related (focal) (partial) epilepsy and epileptic syndromes with complex partial seizures, without mention of intractable epilepsy (02/07/2013), Memory deficit (02/07/2013), Memory loss, Mitral valve prolapse (06/01/10), Osteopenia, PONV (postoperative nausea and vomiting), SOB (shortness of breath) (10/18/11), and Spinal fusion failure. here with:  1.  History of seizures, recent occurrence around August 2020  She has done quite well with Dilantin dose increase, in fact, she is feeling much better overall.  She will remain on brand-name Dilantin 100 mg ER capsule, 1 tablet 3 times daily, Dilantin 30 mg ER capsule once daily.  She is on generic Keppra, has found a specific manufacturer that she can tolerate, but if this manufacturer is not able to continue supply of her medication, we may need to initiate treatment with brand-name Keppra.  She was previously on brand-name Keppra, but insurance stopped paying.  I will check routine blood work today.  She will call for recurrent seizure.  She will follow-up in 6 months or sooner if needed.   I spent 15 minutes with the patient. 50% of this time was spent discussing her plan of care.   Butler Denmark, AGNP-C, DNP 06/05/2019, 2:09 PM [facility] [address], [place], [place] [address] [phone]

## 2019-06-06 IMAGING — CT CT CHEST LUNG CANCER SCREENING LOW DOSE W/O CM
2 of 4 series · 15 of 40 positions shown, 18 images · non-contrast
Comparison: None.

CLINICAL DATA: 56-year-old female with 43 pack-year history of
smoking. Lung cancer screening.

EXAM:
CT CHEST WITHOUT CONTRAST LOW-DOSE FOR LUNG CANCER SCREENING
TECHNIQUE: Multidetector CT imaging of the chest was performed following the
standard protocol without IV contrast.

[Series 2: thorax 5.0 i31f 3 · axial · 0.73mm/px · z∈[-277,-12]mm · 12 of 63 slices shown, 15 images]
[im 5/63  mediastinal]
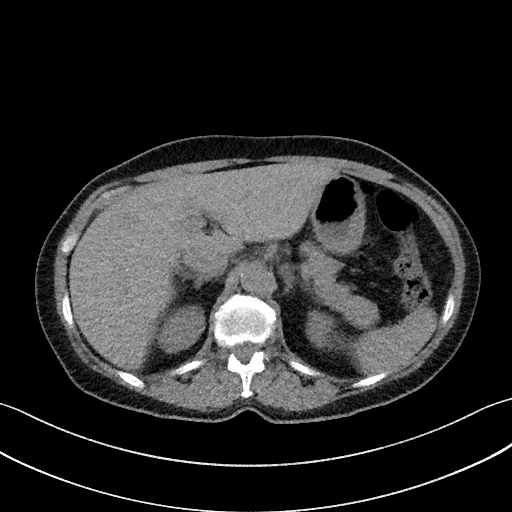
[im 5/63  lung]
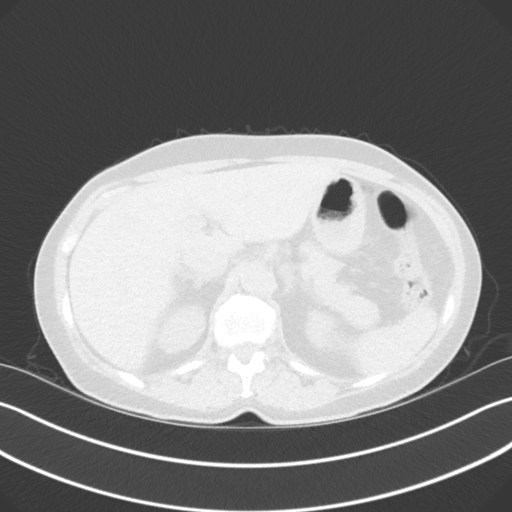
[im 10/63  lung]
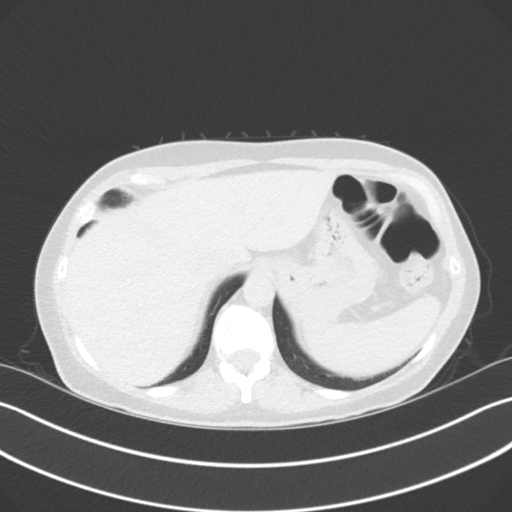
[im 15/63  lung]
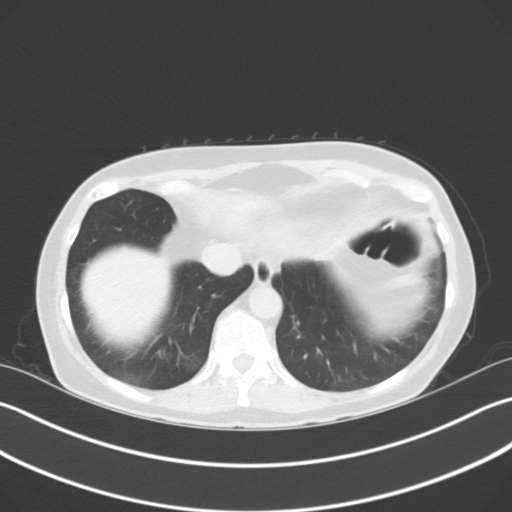
[im 20/63  lung]
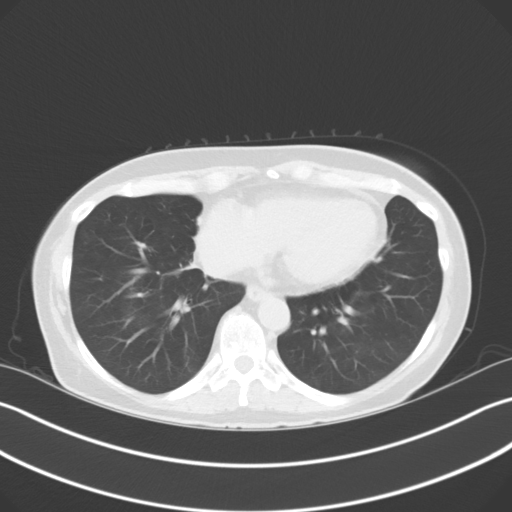
[im 24/63  mediastinal]
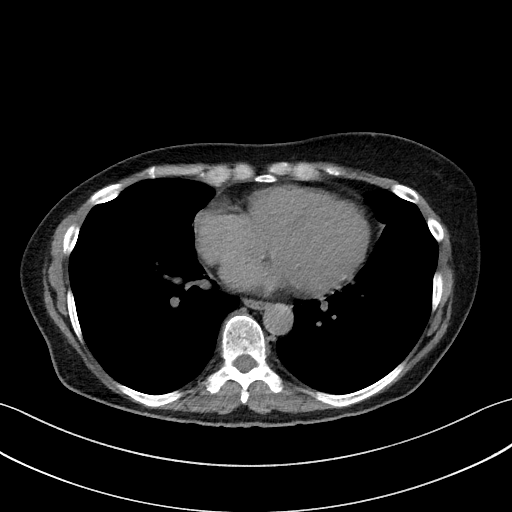
[im 24/63  lung]
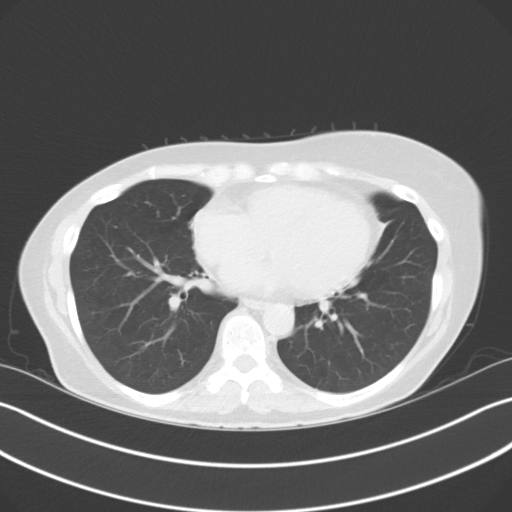
[im 29/63  lung]
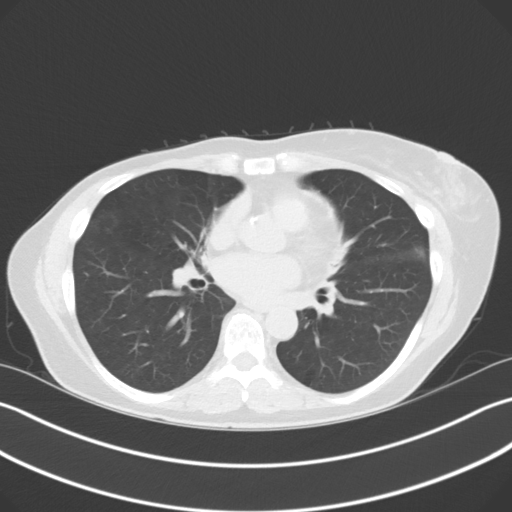
[im 34/63  lung]
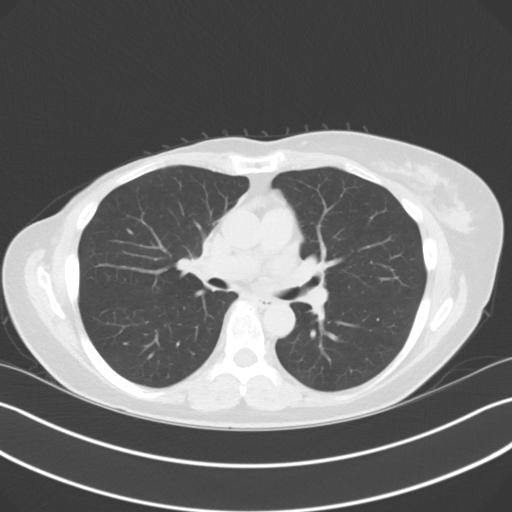
[im 39/63  lung]
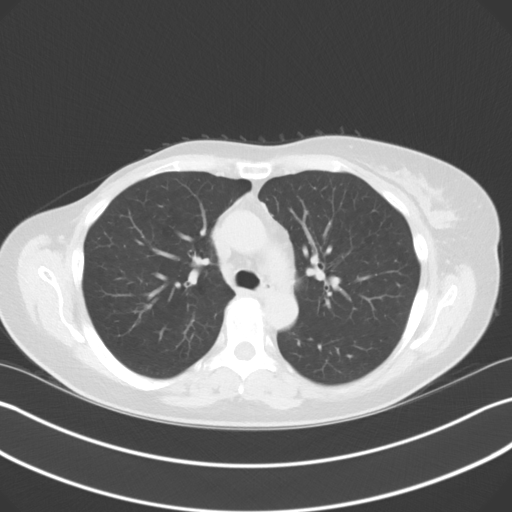
[im 43/63  mediastinal]
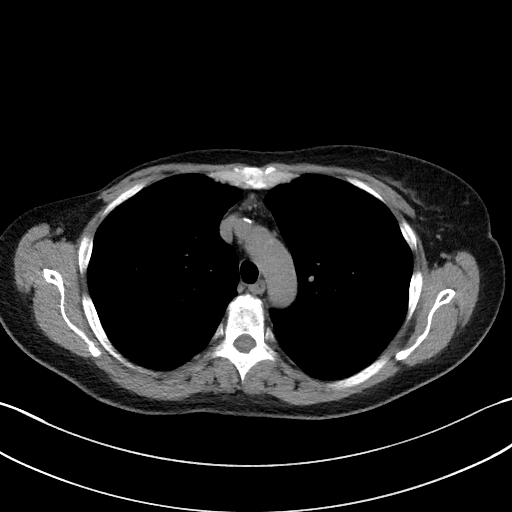
[im 43/63  lung]
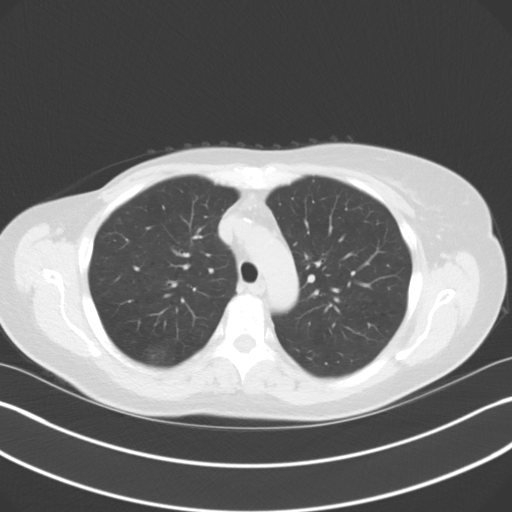
[im 48/63  lung]
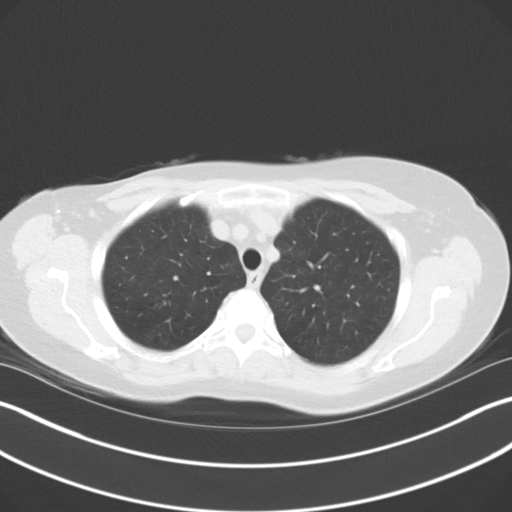
[im 53/63  lung]
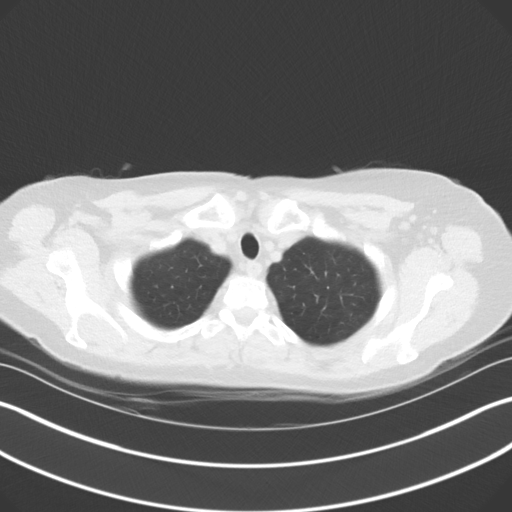
[im 58/63  lung]
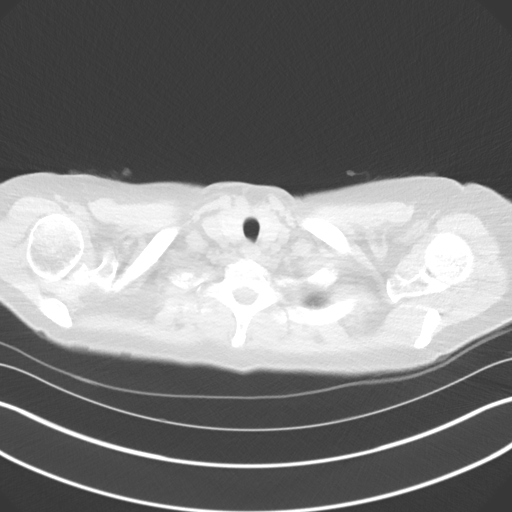

[Series 5: coronal · coronal · 0.59mm/px · 3 of 100 slices shown]
[im 20/100  lung]
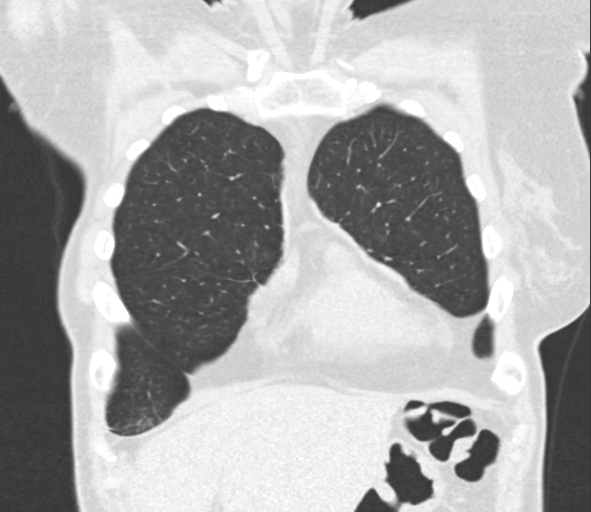
[im 40/100  lung]
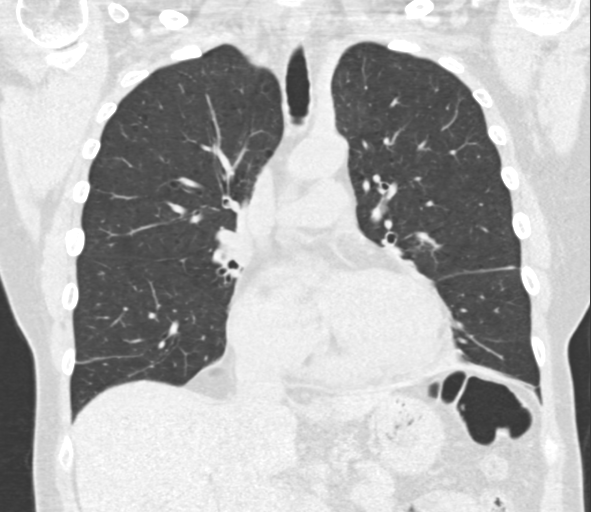
[im 60/100  lung]
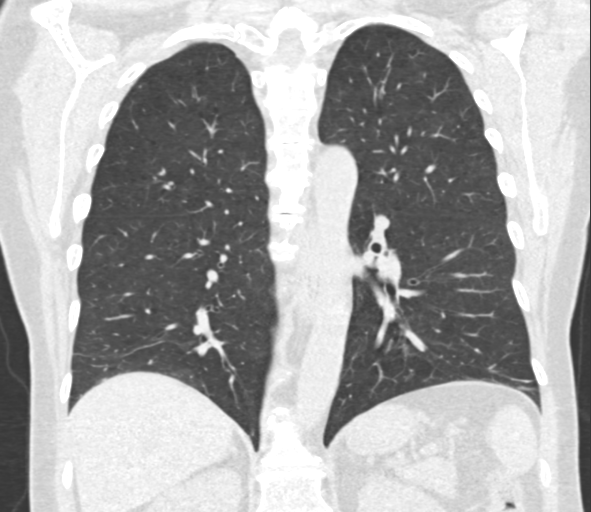

[15 of 40 positions shown; findings below may reference images not displayed]

FINDINGS: Cardiovascular: Heart size upper normal. Trace pericardial fluid.
Atherosclerotic calcification is noted in the wall of the thoracic
aorta.

Mediastinum/Nodes: No mediastinal lymphadenopathy. No evidence for
gross hilar lymphadenopathy although assessment is limited by the
lack of intravenous contrast on today's study. The esophagus has
normal imaging features. Tiny lymph nodes evident in both axillary
regions.

Lungs/Pleura: The central tracheobronchial airways are patent. Tiny
calcified granuloma noted left lung. Tiny noncalcified right upper
lobe granuloma as maximum volume derived equivalent diameter of
mm. Centrilobular emphysema evident.

Upper Abdomen: Unremarkable.

Musculoskeletal: No worrisome lytic or sclerotic osseous
abnormality.
IMPRESSION: 1. Lung-RADS 2, benign appearance or behavior. Continue annual
screening with low-dose chest CT without contrast in 12 months.
2.  Emphysema. (WUGSV-2GY.T)
3.  Aortic Atherosclerois (WUGSV-170.0)

## 2019-06-07 LAB — CBC WITH DIFFERENTIAL/PLATELET
Basophils Absolute: 0.1 10*3/uL (ref 0.0–0.2)
Basos: 2 %
EOS (ABSOLUTE): 0.2 10*3/uL (ref 0.0–0.4)
Eos: 2 %
Hematocrit: 47.3 % — ABNORMAL HIGH (ref 34.0–46.6)
Hemoglobin: 16.5 g/dL — ABNORMAL HIGH (ref 11.1–15.9)
Immature Grans (Abs): 0 10*3/uL (ref 0.0–0.1)
Immature Granulocytes: 0 %
Lymphocytes Absolute: 2.9 10*3/uL (ref 0.7–3.1)
Lymphs: 41 %
MCH: 31.3 pg (ref 26.6–33.0)
MCHC: 34.9 g/dL (ref 31.5–35.7)
MCV: 90 fL (ref 79–97)
Monocytes Absolute: 0.8 10*3/uL (ref 0.1–0.9)
Monocytes: 11 %
Neutrophils Absolute: 3.1 10*3/uL (ref 1.4–7.0)
Neutrophils: 44 %
Platelets: 156 10*3/uL (ref 150–450)
RBC: 5.28 x10E6/uL (ref 3.77–5.28)
RDW: 12.7 % (ref 11.7–15.4)
WBC: 7.1 10*3/uL (ref 3.4–10.8)

## 2019-06-07 LAB — COMPREHENSIVE METABOLIC PANEL
ALT: 19 IU/L (ref 0–32)
AST: 16 IU/L (ref 0–40)
Albumin/Globulin Ratio: 1.9 (ref 1.2–2.2)
Albumin: 4.3 g/dL (ref 3.8–4.9)
Alkaline Phosphatase: 138 IU/L — ABNORMAL HIGH (ref 39–117)
BUN/Creatinine Ratio: 12 (ref 9–23)
BUN: 12 mg/dL (ref 6–24)
Bilirubin Total: 0.2 mg/dL (ref 0.0–1.2)
CO2: 25 mmol/L (ref 20–29)
Calcium: 8.9 mg/dL (ref 8.7–10.2)
Chloride: 106 mmol/L (ref 96–106)
Creatinine, Ser: 1 mg/dL (ref 0.57–1.00)
GFR calc Af Amer: 71 mL/min/{1.73_m2} (ref >59)
GFR calc non Af Amer: 62 mL/min/{1.73_m2} (ref >59)
Globulin, Total: 2.3 g/dL (ref 1.5–4.5)
Glucose: 89 mg/dL (ref 65–99)
Potassium: 4.5 mmol/L (ref 3.5–5.2)
Sodium: 144 mmol/L (ref 134–144)
Total Protein: 6.6 g/dL (ref 6.0–8.5)

## 2019-06-07 LAB — LEVETIRACETAM LEVEL: Levetiracetam Lvl: 37.1 ug/mL (ref 10.0–40.0)

## 2019-06-07 LAB — PHENYTOIN LEVEL, TOTAL: Phenytoin (Dilantin), Serum: 16.8 ug/mL (ref 10.0–20.0)

## 2019-06-10 ENCOUNTER — Other Ambulatory Visit: Payer: Self-pay | Admitting: Physician Assistant

## 2019-06-12 ENCOUNTER — Inpatient Hospital Stay (HOSPITAL_COMMUNITY): Payer: Medicare Other | Attending: Hematology

## 2019-06-13 ENCOUNTER — Other Ambulatory Visit (HOSPITAL_COMMUNITY): Payer: Medicare Other

## 2019-06-17 ENCOUNTER — Telehealth: Payer: Self-pay | Admitting: *Deleted

## 2019-06-17 NOTE — Telephone Encounter (Signed)
-----   Message from Penni Bombard, MD sent at 06/14/2019 12:21 PM EST ----- Unremarkable labs. Continue current plan. Please call patient. -VRP

## 2019-06-17 NOTE — Telephone Encounter (Signed)
LMVM for pt on her mobilethat her lab results were unremarkable.  She is to call back if questions.

## 2019-08-05 DIAGNOSIS — Z23 Encounter for immunization: Secondary | ICD-10-CM | POA: Diagnosis not present

## 2019-08-20 ENCOUNTER — Other Ambulatory Visit: Payer: Self-pay | Admitting: Internal Medicine

## 2019-08-20 DIAGNOSIS — I10 Essential (primary) hypertension: Secondary | ICD-10-CM | POA: Diagnosis not present

## 2019-08-20 DIAGNOSIS — I700 Atherosclerosis of aorta: Secondary | ICD-10-CM | POA: Diagnosis not present

## 2019-08-20 DIAGNOSIS — R7309 Other abnormal glucose: Secondary | ICD-10-CM | POA: Diagnosis not present

## 2019-08-20 DIAGNOSIS — E559 Vitamin D deficiency, unspecified: Secondary | ICD-10-CM | POA: Diagnosis not present

## 2019-08-20 DIAGNOSIS — J439 Emphysema, unspecified: Secondary | ICD-10-CM | POA: Diagnosis not present

## 2019-08-20 DIAGNOSIS — R1013 Epigastric pain: Secondary | ICD-10-CM

## 2019-08-20 DIAGNOSIS — G40909 Epilepsy, unspecified, not intractable, without status epilepticus: Secondary | ICD-10-CM | POA: Diagnosis not present

## 2019-08-20 DIAGNOSIS — C50911 Malignant neoplasm of unspecified site of right female breast: Secondary | ICD-10-CM | POA: Diagnosis not present

## 2019-08-20 DIAGNOSIS — F322 Major depressive disorder, single episode, severe without psychotic features: Secondary | ICD-10-CM | POA: Diagnosis not present

## 2019-08-20 DIAGNOSIS — Z13220 Encounter for screening for lipoid disorders: Secondary | ICD-10-CM | POA: Diagnosis not present

## 2019-08-20 DIAGNOSIS — M8580 Other specified disorders of bone density and structure, unspecified site: Secondary | ICD-10-CM | POA: Diagnosis not present

## 2019-08-20 DIAGNOSIS — K219 Gastro-esophageal reflux disease without esophagitis: Secondary | ICD-10-CM | POA: Diagnosis not present

## 2019-08-20 DIAGNOSIS — Z0000 Encounter for general adult medical examination without abnormal findings: Secondary | ICD-10-CM | POA: Diagnosis not present

## 2019-08-20 DIAGNOSIS — Z1389 Encounter for screening for other disorder: Secondary | ICD-10-CM | POA: Diagnosis not present

## 2019-08-23 ENCOUNTER — Ambulatory Visit
Admission: RE | Admit: 2019-08-23 | Discharge: 2019-08-23 | Disposition: A | Discharge status: Home / Self Care | Payer: Medicare Other | Source: Ambulatory Visit | Attending: Internal Medicine | Admitting: Internal Medicine

## 2019-08-23 DIAGNOSIS — K224 Dyskinesia of esophagus: Secondary | ICD-10-CM | POA: Diagnosis not present

## 2019-08-23 DIAGNOSIS — R1013 Epigastric pain: Secondary | ICD-10-CM

## 2019-08-23 DIAGNOSIS — K219 Gastro-esophageal reflux disease without esophagitis: Secondary | ICD-10-CM | POA: Diagnosis not present

## 2019-08-26 ENCOUNTER — Other Ambulatory Visit: Payer: Self-pay | Admitting: Physician Assistant

## 2019-08-26 ENCOUNTER — Other Ambulatory Visit (HOSPITAL_COMMUNITY)
Admission: RE | Admit: 2019-08-26 | Discharge: 2019-08-26 | Disposition: A | Discharge status: Home / Self Care | Payer: Medicare Other | Source: Ambulatory Visit | Attending: Physician Assistant | Admitting: Physician Assistant

## 2019-08-26 DIAGNOSIS — Z1151 Encounter for screening for human papillomavirus (HPV): Secondary | ICD-10-CM | POA: Diagnosis not present

## 2019-08-26 DIAGNOSIS — Z124 Encounter for screening for malignant neoplasm of cervix: Secondary | ICD-10-CM | POA: Insufficient documentation

## 2019-08-26 DIAGNOSIS — Z23 Encounter for immunization: Secondary | ICD-10-CM | POA: Diagnosis not present

## 2019-08-26 DIAGNOSIS — Z9011 Acquired absence of right breast and nipple: Secondary | ICD-10-CM | POA: Diagnosis not present

## 2019-08-30 LAB — CYTOLOGY - PAP
Comment: NEGATIVE
Comment: NEGATIVE
Diagnosis: NEGATIVE
HPV 16: NEGATIVE
HPV 18 / 45: POSITIVE — AB
High risk HPV: POSITIVE — AB

## 2019-10-16 ENCOUNTER — Other Ambulatory Visit: Payer: Self-pay | Admitting: Obstetrics and Gynecology

## 2019-12-01 ENCOUNTER — Other Ambulatory Visit: Payer: Self-pay | Admitting: Neurology

## 2019-12-05 ENCOUNTER — Ambulatory Visit: Payer: Medicare Other | Admitting: Neurology

## 2019-12-09 ENCOUNTER — Ambulatory Visit: Payer: Medicare Other | Admitting: Neurology

## 2019-12-09 NOTE — Progress Notes (Deleted)
PATIENT: Wendy Lopez DOB: 1960-10-31  REASON FOR VISIT: follow up HISTORY FROM: patient  HISTORY OF PRESENT ILLNESS: Today 12/09/19  HISTORY 06/05/2019 SS: Wendy Lopez is a 59 year old female with history of seizures.  When last seen, she had likely seizure episode, when she had awakened and had bitten her tongue.  Her Dilantin level was increased with a 30 mg capsule, she remains on Keppra.  She indicates since increasing her dose of Dilantin, she is actually feeling a lot better, has been having more energy, she is very pleased.  She is taking generic Keppra, has a Licensed conveyancer (Lupen) that works for her.  She says she is highly allergic to a common filler in generic medications.  Her allergy has been so significant, at times has had to use EpiPen.  She has not had recurrent seizure.  She is working with pharmacy, unsure, how much more medication she will be able to receive from this manufacturer.  In the past, she has been on brand-name Keppra, but insurance did not pay.  She presents today for follow-up unaccompanied.   REVIEW OF SYSTEMS: Out of a complete 14 system review of symptoms, the patient complains only of the following symptoms, and all other reviewed systems are negative.  ALLERGIES: Allergies  Allergen Reactions  . Carbamazepine Anaphylaxis  . Lamictal [Lamotrigine] Anaphylaxis    SOB, Lamotrigine only  . Other     STATES HAS SHORTNESS OF BREATH WITH GENERIC DRUGS     ALSO RASH AND ITCHING  . Pseudoephedrine Hcl Er Other (See Comments)    SEIZURES.  . Valium Other (See Comments)    Hallucinations, spasticity, hyper-relfexive   . Amlodipine Rash    Face swell  . Oxycodone Nausea And Vomiting and Rash  . Prednisone Rash and Other (See Comments)    Prescribing MD told patient he thinks dose (dose pack) was "just too high." 03/09/11   STATES HAS TAKEN SMALLER DOSE AND STILL HAD RASH AND JITTERINESS  . Ibuprofen Swelling  . Percocet  [Oxycodone-Acetaminophen] Nausea And Vomiting  . Betadine [Povidone Iodine] Rash  . Latex Dermatitis and Rash  . Shellfish Allergy Swelling    HOME MEDICATIONS: Outpatient Medications Prior to Visit  Medication Sig Dispense Refill  . aspirin-acetaminophen-caffeine (EXCEDRIN EXTRA STRENGTH) 250-250-65 MG per tablet Take 1 tablet by mouth as needed. Pain     . cyanocobalamin 100 MCG tablet Take 100 mcg by mouth daily.    Marland Kitchen DILANTIN 100 MG ER capsule Take 1 capsule (100 mg total) by mouth 3 (three) times daily. 270 capsule 3  . DILANTIN 30 MG ER capsule TAKE 1 CAPSULE(30 MG) BY MOUTH DAILY 90 capsule 1  . levETIRAcetam (KEPPRA) 500 MG tablet One tablet in the morning and 2 in the evening 270 tablet 3  . metoprolol succinate (TOPROL-XL) 100 MG 24 hr tablet TAKE 1 TABLET BY MOUTH TWICE DAILY. TAKE WITH OR IMMEDIATELY FOLLOWING A MEAL. 180 tablet 2  . Misc. Devices MISC Please provide patient with right breast prosthesis and 6 mastectomy bras sue to a history of right breast cancer and mastectomy. (ICD10- C50.111) 6 each 0  . Vitamin D, Ergocalciferol, (DRISDOL) 1.25 MG (50000 UT) CAPS capsule Take 50,000 Units by mouth every 7 (seven) days.     No facility-administered medications prior to visit.    PAST MEDICAL HISTORY: Past Medical History:  Diagnosis Date  . Anxiety and depression   . Arthritis   . Breast cancer ([place]) 02/13/2009  . Cancer of  right breast ([place]) 04/16/2012  . Depression   . Depression 2014  . Dysrhythmia    palpitations  . Epilepsy ([place])    last seizure 1990's  . H/O hiatal hernia   . Localization-related (focal) (partial) epilepsy and epileptic syndromes with complex partial seizures, without mention of intractable epilepsy 02/07/2013  . Memory deficit 02/07/2013  . Memory loss   . Mitral valve prolapse 06/01/10   echo- EF>55% mild tricupsid regurgitation. There is mild Pulmonary Hypertension.. Right  ventricular systolic pressure iselevated at 30-40 mmHg. LV  systolic function is normal.  . Osteopenia   . PONV (postoperative nausea and vomiting)   . SOB (shortness of breath) 10/18/11   met-test  . Spinal fusion failure    3    PAST SURGICAL HISTORY: Past Surgical History:  Procedure Laterality Date  . BACK SURGERY     screws- lumbar  . BREAST SURGERY  2010   mastectomy - RIGHT  . COLONOSCOPY WITH PROPOFOL  03/13/2012   Procedure: COLONOSCOPY WITH PROPOFOL;  Surgeon: Garlan Fair, MD;  Location: [facility];  Service: Endoscopy;  Laterality: N/A;  . ESOPHAGOGASTRODUODENOSCOPY  03/13/2012   Procedure: ESOPHAGOGASTRODUODENOSCOPY (EGD);  Surgeon: Garlan Fair, MD;  Location: [facility];  Service: Endoscopy;  Laterality: N/A;  Reflux, Hiatal Hernia  . HAND TENDON SURGERY  2012   tendons released in right hand   . MASTECTOMY, RADICAL  02/13/2009  . [place], 2003, 2005  . TONSILLECTOMY      FAMILY HISTORY: Family History  Problem Relation Age of Onset  . Arthritis Mother        rhuematoid  . Heart disease Mother        Atrial fib  . Heart disease Father   . Heart disease Sister        Atrial fib    SOCIAL HISTORY: Social History   Socioeconomic History  . Marital status: Married    Spouse name: Not on file  . Number of children: 1  . Years of education: college 2  . Highest education level: Not on file  Occupational History  . Occupation: unemployed    Fish farm manager: UNEMPLOYED    Comment: 2003  Tobacco Use  . Smoking status: Current Every Day Smoker    Packs/day: 1.00    Years: 43.00    Pack years: 43.00    Types: Cigarettes  . Smokeless tobacco: Never Used  . Tobacco comment: 1/2 pk per day  Substance and Sexual Activity  . Alcohol use: No    Alcohol/week: 0.0 standard drinks  . Drug use: No  . Sexual activity: Not on file  Other Topics Concern  . Not on file  Social History Narrative   Patient is married with one child.   Patient is right handed.   Patient has 2 yrs of education.    Patient does not drink caffeine.   Social Determinants of Health   Financial Resource Strain:   . Difficulty of Paying Living Expenses: Not on file  Food Insecurity:   . Worried About Charity fundraiser in the Last Year: Not on file  . Ran Out of Food in the Last Year: Not on file  Transportation Needs:   . Lack of Transportation (Medical): Not on file  . Lack of Transportation (Non-Medical): Not on file  Physical Activity:   . Days of Exercise per Week: Not on file  . Minutes of Exercise per Session: Not on file  Stress:   . Feeling  of Stress : Not on file  Social Connections:   . Frequency of Communication with Friends and Family: Not on file  . Frequency of Social Gatherings with Friends and Family: Not on file  . Attends Religious Services: Not on file  . Active Member of Clubs or Organizations: Not on file  . Attends Archivist Meetings: Not on file  . Marital Status: Not on file  Intimate Partner Violence:   . Fear of Current or Ex-Partner: Not on file  . Emotionally Abused: Not on file  . Physically Abused: Not on file  . Sexually Abused: Not on file      PHYSICAL EXAM  There were no vitals filed for this visit. There is no height or weight on file to calculate BMI.  Generalized: Well developed, in no acute distress   Neurological examination  Mentation: Alert oriented to time, place, history taking. Follows all commands speech and language fluent Cranial nerve II-XII: Pupils were equal round reactive to light. Extraocular movements were full, visual field were full on confrontational test. Facial sensation and strength were normal. Uvula tongue midline. Head turning and shoulder shrug  were normal and symmetric. Motor: The motor testing reveals 5 over 5 strength of all 4 extremities. Good symmetric motor tone is noted throughout.  Sensory: Sensory testing is intact to soft touch on all 4 extremities. No evidence of extinction is noted.  Coordination:  Cerebellar testing reveals good finger-nose-finger and heel-to-shin bilaterally.  Gait and station: Gait is normal. Tandem gait is normal. Romberg is negative. No drift is seen.  Reflexes: Deep tendon reflexes are symmetric and normal bilaterally.   DIAGNOSTIC DATA (LABS, IMAGING, TESTING) - I reviewed patient records, labs, notes, testing and imaging myself where available.  Lab Results  Component Value Date   WBC 7.1 06/05/2019   HGB 16.5 (H) 06/05/2019   HCT 47.3 (H) 06/05/2019   MCV 90 06/05/2019   PLT 156 06/05/2019      Component Value Date/Time   NA 144 06/05/2019 1409   K 4.5 06/05/2019 1409   CL 106 06/05/2019 1409   CO2 25 06/05/2019 1409   GLUCOSE 89 06/05/2019 1409   GLUCOSE 111 (H) 11/20/2018 1101   BUN 12 06/05/2019 1409   CREATININE 1.00 06/05/2019 1409   CALCIUM 8.9 06/05/2019 1409   PROT 6.6 06/05/2019 1409   ALBUMIN 4.3 06/05/2019 1409   AST 16 06/05/2019 1409   ALT 19 06/05/2019 1409   ALKPHOS 138 (H) 06/05/2019 1409   BILITOT 0.2 06/05/2019 1409   GFRNONAA 62 06/05/2019 1409   GFRAA 71 06/05/2019 1409   Lab Results  Component Value Date   CHOL 170 02/17/2017   HDL 70 02/17/2017   LDLCALC 87 02/17/2017   TRIG 65 02/17/2017   CHOLHDL 2.4 02/17/2017   No results found for: HGBA1C Lab Results  Component Value Date   VITAMINB12 292 11/21/2017   Lab Results  Component Value Date   TSH 0.842 04/06/2018      ASSESSMENT AND PLAN 59 y.o. year old female  has a past medical history of Anxiety and depression, Arthritis, Breast cancer ([place]) (02/13/2009), Cancer of right breast ([place]) (04/16/2012), Depression, Depression (2014), Dysrhythmia, Epilepsy ([place]), H/O hiatal hernia, Localization-related (focal) (partial) epilepsy and epileptic syndromes with complex partial seizures, without mention of intractable epilepsy (02/07/2013), Memory deficit (02/07/2013), Memory loss, Mitral valve prolapse (06/01/10), Osteopenia, PONV (postoperative nausea and  vomiting), SOB (shortness of breath) (10/18/11), and Spinal fusion failure. here with ***  I spent 15 minutes with the patient. 50% of this time was spent   Butler Denmark, [place], DNP 12/09/2019, 5:45 AM [facility] [address], [place], [place] [address] [phone]

## 2019-12-17 ENCOUNTER — Ambulatory Visit (HOSPITAL_COMMUNITY): Payer: Medicare Other | Admitting: Nurse Practitioner

## 2020-01-23 ENCOUNTER — Telehealth: Payer: Self-pay | Admitting: Radiology

## 2020-01-23 ENCOUNTER — Other Ambulatory Visit: Payer: Self-pay

## 2020-01-23 ENCOUNTER — Encounter: Payer: Self-pay | Admitting: Cardiology

## 2020-01-23 ENCOUNTER — Ambulatory Visit (INDEPENDENT_AMBULATORY_CARE_PROVIDER_SITE_OTHER): Payer: Medicare Other | Admitting: Cardiology

## 2020-01-23 VITALS — BP 140/90 | HR 68 | Ht 62.5 in | Wt 131.6 lb

## 2020-01-23 DIAGNOSIS — Z853 Personal history of malignant neoplasm of breast: Secondary | ICD-10-CM | POA: Insufficient documentation

## 2020-01-23 DIAGNOSIS — F418 Other specified anxiety disorders: Secondary | ICD-10-CM | POA: Diagnosis not present

## 2020-01-23 DIAGNOSIS — R002 Palpitations: Secondary | ICD-10-CM | POA: Diagnosis not present

## 2020-01-23 DIAGNOSIS — I10 Essential (primary) hypertension: Secondary | ICD-10-CM

## 2020-01-23 DIAGNOSIS — R4589 Other symptoms and signs involving emotional state: Secondary | ICD-10-CM | POA: Insufficient documentation

## 2020-01-23 DIAGNOSIS — M519 Unspecified thoracic, thoracolumbar and lumbosacral intervertebral disc disorder: Secondary | ICD-10-CM

## 2020-01-23 NOTE — Assessment & Plan Note (Signed)
If her monitor is unrevealing refer back to her PCP

## 2020-01-23 NOTE — Assessment & Plan Note (Signed)
B/P by me 140/60

## 2020-01-23 NOTE — Assessment & Plan Note (Signed)
Neuropathic pain- followed at [facility]

## 2020-01-23 NOTE — Progress Notes (Signed)
Cardiology Office Note:    Date:  01/23/2020   ID:  Wendy Lopez, DOB 04/21/1960, MRN [id number]  PCP:  Wenda Low, MD  Cardiologist:  Shelva Majestic, MD  Electrophysiologist:  None   Referring MD: Wenda Low, MD   No chief complaint on file. palpitations  History of Present Illness:    Wendy Lopez is a 59 y.o. female with a hx of chest pain and palpitations.  She has had several evaluations in the past.  In 2009 she had a catheterization which showed normal coronaries.  She has had several nuclear stress test.  Her last stress test was February 23, 2018 and showed no ischemia.  Echocardiogram done in November 2019 showed normal LV function.  Outpatient monitor done in August 2020 showed normal sinus rhythm with average heart rate of 70.  The patient is in the office today with complaints of labile B/P and HR.  She says her diastolic B/P is sometimes 90.  She takes her B/P "several times a day".  She also complains of palpitations.  She is tearful in the office today, "I know something is wrong".  Past Medical History:  Diagnosis Date  . Anxiety and depression   . Arthritis   . Breast cancer ([place]) 02/13/2009  . Cancer of right breast ([place]) 04/16/2012  . Depression   . Depression 2014  . Dysrhythmia    palpitations  . Epilepsy ([place])    last seizure 1990's  . H/O hiatal hernia   . Localization-related (focal) (partial) epilepsy and epileptic syndromes with complex partial seizures, without mention of intractable epilepsy 02/07/2013  . Memory deficit 02/07/2013  . Memory loss   . Mitral valve prolapse 06/01/10   echo- EF>55% mild tricupsid regurgitation. There is mild Pulmonary Hypertension.. Right  ventricular systolic pressure iselevated at 30-40 mmHg. LV systolic function is normal.  . Osteopenia   . PONV (postoperative nausea and vomiting)   . SOB (shortness of breath) 10/18/11   met-test  . Spinal fusion failure    3    Past Surgical History:  Procedure  Laterality Date  . BACK SURGERY     screws- lumbar  . BREAST SURGERY  2010   mastectomy - RIGHT  . COLONOSCOPY WITH PROPOFOL  03/13/2012   Procedure: COLONOSCOPY WITH PROPOFOL;  Surgeon: Garlan Fair, MD;  Location: [facility];  Service: Endoscopy;  Laterality: N/A;  . ESOPHAGOGASTRODUODENOSCOPY  03/13/2012   Procedure: ESOPHAGOGASTRODUODENOSCOPY (EGD);  Surgeon: Garlan Fair, MD;  Location: [facility];  Service: Endoscopy;  Laterality: N/A;  Reflux, Hiatal Hernia  . HAND TENDON SURGERY  2012   tendons released in right hand   . MASTECTOMY, RADICAL  02/13/2009  . [place], 2003, 2005  . TONSILLECTOMY      Current Medications: Current Meds  Medication Sig  . aspirin-acetaminophen-caffeine (EXCEDRIN EXTRA STRENGTH) 258-527-78 MG per tablet Take 1 tablet by mouth as needed. Pain   . cyanocobalamin 100 MCG tablet Take 100 mcg by mouth daily.  Marland Kitchen DILANTIN 100 MG ER capsule Take 1 capsule (100 mg total) by mouth 3 (three) times daily.  Marland Kitchen DILANTIN 30 MG ER capsule TAKE 1 CAPSULE(30 MG) BY MOUTH DAILY  . levETIRAcetam (KEPPRA) 500 MG tablet One tablet in the morning and 2 in the evening  . metoprolol succinate (TOPROL-XL) 100 MG 24 hr tablet TAKE 1 TABLET BY MOUTH TWICE DAILY. TAKE WITH OR IMMEDIATELY FOLLOWING A MEAL. (Patient taking differently: Pt takes 1 tab daily)  . Misc.  Devices MISC Please provide patient with right breast prosthesis and 6 mastectomy bras sue to a history of right breast cancer and mastectomy. (ICD10- C50.111)  . Vitamin D, Ergocalciferol, (DRISDOL) 1.25 MG (50000 UT) CAPS capsule Take 50,000 Units by mouth every 7 (seven) days.     Allergies:   Carbamazepine, Lamictal [lamotrigine], Other, Pseudoephedrine hcl er, Valium, Amlodipine, Oxycodone, Prednisone, Ibuprofen, Percocet [oxycodone-acetaminophen], Betadine [povidone iodine], Latex, and Shellfish allergy   Social History   Socioeconomic History  . Marital status: Married    Spouse name:  Not on file  . Number of children: 1  . Years of education: college 2  . Highest education level: Not on file  Occupational History  . Occupation: unemployed    Fish farm manager: UNEMPLOYED    Comment: 2003  Tobacco Use  . Smoking status: Current Every Day Smoker    Packs/day: 1.00    Years: 43.00    Pack years: 43.00    Types: Cigarettes  . Smokeless tobacco: Never Used  . Tobacco comment: 1/2 pk per day  Substance and Sexual Activity  . Alcohol use: No    Alcohol/week: 0.0 standard drinks  . Drug use: No  . Sexual activity: Not on file  Other Topics Concern  . Not on file  Social History Narrative   Patient is married with one child.   Patient is right handed.   Patient has 2 yrs of education.   Patient does not drink caffeine.   Social Determinants of Health   Financial Resource Strain:   . Difficulty of Paying Living Expenses: Not on file  Food Insecurity:   . Worried About Charity fundraiser in the Last Year: Not on file  . Ran Out of Food in the Last Year: Not on file  Transportation Needs:   . Lack of Transportation (Medical): Not on file  . Lack of Transportation (Non-Medical): Not on file  Physical Activity:   . Days of Exercise per Week: Not on file  . Minutes of Exercise per Session: Not on file  Stress:   . Feeling of Stress : Not on file  Social Connections:   . Frequency of Communication with Friends and Family: Not on file  . Frequency of Social Gatherings with Friends and Family: Not on file  . Attends Religious Services: Not on file  . Active Member of Clubs or Organizations: Not on file  . Attends Archivist Meetings: Not on file  . Marital Status: Not on file     Family History: The patient's family history includes Arthritis in her mother; Heart disease in her father, mother, and sister.  ROS:   Please see the history of present illness.     All other systems reviewed and are negative.  EKGs/Labs/Other Studies Reviewed:    The  following studies were reviewed today: Myoview 02/23/2018-  Nuclear stress EF: 67%.  The left ventricular ejection fraction is hyperdynamic (>65%).  There was no ST segment deviation noted during stress.  The study is normal.  This is a low risk study.  Echo 02/26/2018- Study Conclusions   - Left ventricle: Myocardium appears echo bright but suspect this  is due to gains set by tech. The cavity size was normal. Systolic  function was normal. The estimated ejection fraction was in the  range of 55% to 60%. Wall motion was normal; there were no  regional wall motion abnormalities. Left ventricular diastolic  function parameters were normal.  - Mitral valve: Mitral valve is  thickened but no frank prolapse.  - Atrial septum: No defect or patent foramen ovale was identified.   Event Monitor 12/05/2018- The patient was monitored from July 30 through November 28, 2018.  The patient was in sinus rhythm with an average heartbeat at 70 bpm.  The minimum heartbeat was sinus bradycardia on August 7 at 05: 08 AM and the fastest heart rate was sinus tachycardia at 112 bpm on August 11 at 7 PM.  There were no episodes of atrial fibrillation, SVT or prolonged bradycardia.  There was no significant ectopy.   EKG:  EKG is ordered today.  The ekg ordered today demonstrates NSR, 68, TWI 3, F, V3- no change from prior EKG- she also had one PVC  Recent Labs: 06/05/2019: ALT 19; BUN 12; Creatinine, Ser 1.00; Hemoglobin 16.5; Platelets 156; Potassium 4.5; Sodium 144  Recent Lipid Panel    Component Value Date/Time   CHOL 170 02/17/2017 0820   TRIG 65 02/17/2017 0820   HDL 70 02/17/2017 0820   CHOLHDL 2.4 02/17/2017 0820   LDLCALC 87 02/17/2017 0820    Physical Exam:    VS:  BP 140/90   Pulse 68   Ht 5' 2.5" (1.588 m)   Wt 131 lb 9.6 oz (59.7 kg)   LMP 11/03/1993   SpO2 96%   BMI 23.69 kg/m     Wt Readings from Last 3 Encounters:  01/23/20 131 lb 9.6 oz (59.7 kg)  06/05/19 132 lb  (59.9 kg)  02/08/19 131 lb 9.6 oz (59.7 kg)     GEN: Well nourished, anxious, tearful, well developed in no acute distress HEENT: Normal NECK: No JVD; No carotid bruits CARDIAC: RRR, no murmurs, rubs, gallops RESPIRATORY:  Clear to auscultation without rales, wheezing or rhonchi  ABDOMEN: Soft, non-tender, non-distended MUSCULOSKELETAL:  No edema; No deformity  SKIN: Warm and dry NEUROLOGIC:  Alert and oriented x 3 PSYCHIATRIC:  Normal affect   ASSESSMENT:    Palpitations Check monitor again-  HTN (hypertension) B/P by me 140/60  History of breast cancer 2016  Lumbar disc disease Neuropathic pain- followed at [facility] If her monitor is unrevealing refer back to her PCP  PLAN:    Check 14 day ZIO- I'll see her in f/u after this since Dr Claiborne Billings has no appointments for 4 months.    Medication Adjustments/Labs and Tests Ordered: Current medicines are reviewed at length with the patient today.  Concerns regarding medicines are outlined above.  Orders Placed This Encounter  Procedures  . LONG TERM MONITOR (3-14 DAYS)  . EKG 12-Lead   No orders of the defined types were placed in this encounter.   Patient Instructions  Medication Instructions:  Continue current medications  *If you need a refill on your cardiac medications before your next appointment, please call your pharmacy*   Lab Work: None Ordered   Testing/Procedures: Your physician has recommended that you wear a 2 weeks Zio monitor. Holter monitors are medical devices that record the heart's electrical activity. Doctors most often use these monitors to diagnose arrhythmias. Arrhythmias are problems with the speed or rhythm of the heartbeat. The monitor is a small, portable device. You can wear one while you do your normal daily activities. This is usually used to diagnose what is causing palpitations/syncope (passing out).   Follow-Up: At [facility], you and your health needs are  our priority.  As part of our continuing mission to provide you with exceptional heart care, we have created designated  Provider Care Teams.  These Care Teams include your primary Cardiologist (physician) and Advanced Practice Providers (APPs -  Physician Assistants and Nurse Practitioners) who all work together to provide you with the care you need, when you need it.  We recommend signing up for the patient portal called "MyChart".  Sign up information is provided on this After Visit Summary.  MyChart is used to connect with patients for Virtual Visits (Telemedicine).  Patients are able to view lab/test results, encounter notes, upcoming appointments, etc.  Non-urgent messages can be sent to your provider as well.   To learn more about what you can do with MyChart, go to [email or web address].    Your next appointment:   4-6 week(s)  The format for your next appointment:   In Person  Provider:   Shelva Majestic, MD       Signed, Kerin Ransom, PA-C  01/23/2020 3:56 PM    [place]

## 2020-01-23 NOTE — Assessment & Plan Note (Signed)
2016

## 2020-01-23 NOTE — Patient Instructions (Signed)
Medication Instructions:  Continue current medications  *If you need a refill on your cardiac medications before your next appointment, please call your pharmacy*   Lab Work: None Ordered   Testing/Procedures: Your physician has recommended that you wear a 2 weeks Zio monitor. Holter monitors are medical devices that record the heart's electrical activity. Doctors most often use these monitors to diagnose arrhythmias. Arrhythmias are problems with the speed or rhythm of the heartbeat. The monitor is a small, portable device. You can wear one while you do your normal daily activities. This is usually used to diagnose what is causing palpitations/syncope (passing out).   Follow-Up: At [facility], you and your health needs are our priority.  As part of our continuing mission to provide you with exceptional heart care, we have created designated Provider Care Teams.  These Care Teams include your primary Cardiologist (physician) and Advanced Practice Providers (APPs -  Physician Assistants and Nurse Practitioners) who all work together to provide you with the care you need, when you need it.  We recommend signing up for the patient portal called "MyChart".  Sign up information is provided on this After Visit Summary.  MyChart is used to connect with patients for Virtual Visits (Telemedicine).  Patients are able to view lab/test results, encounter notes, upcoming appointments, etc.  Non-urgent messages can be sent to your provider as well.   To learn more about what you can do with MyChart, go to [email or web address].    Your next appointment:   4-6 week(s)  The format for your next appointment:   In Person  Provider:   Shelva Majestic, MD

## 2020-01-23 NOTE — Assessment & Plan Note (Signed)
Check monitor again-

## 2020-01-23 NOTE — Telephone Encounter (Signed)
Enrolled patient for a 14 day Zio XT monitor to be mailed to patiens home 

## 2020-01-26 ENCOUNTER — Other Ambulatory Visit: Payer: Self-pay | Admitting: Neurology

## 2020-01-28 ENCOUNTER — Other Ambulatory Visit (INDEPENDENT_AMBULATORY_CARE_PROVIDER_SITE_OTHER): Payer: Medicare Other

## 2020-01-28 ENCOUNTER — Other Ambulatory Visit: Payer: Self-pay | Admitting: Internal Medicine

## 2020-01-28 DIAGNOSIS — R519 Headache, unspecified: Secondary | ICD-10-CM | POA: Diagnosis not present

## 2020-01-28 DIAGNOSIS — G44209 Tension-type headache, unspecified, not intractable: Secondary | ICD-10-CM

## 2020-01-28 DIAGNOSIS — R42 Dizziness and giddiness: Secondary | ICD-10-CM

## 2020-01-28 DIAGNOSIS — K219 Gastro-esophageal reflux disease without esophagitis: Secondary | ICD-10-CM | POA: Diagnosis not present

## 2020-01-28 DIAGNOSIS — R002 Palpitations: Secondary | ICD-10-CM

## 2020-01-28 DIAGNOSIS — Z23 Encounter for immunization: Secondary | ICD-10-CM | POA: Diagnosis not present

## 2020-01-28 DIAGNOSIS — F17210 Nicotine dependence, cigarettes, uncomplicated: Secondary | ICD-10-CM | POA: Diagnosis not present

## 2020-02-10 ENCOUNTER — Ambulatory Visit
Admission: RE | Admit: 2020-02-10 | Discharge: 2020-02-10 | Disposition: A | Discharge status: Home / Self Care | Payer: Medicare Other | Source: Ambulatory Visit | Attending: Internal Medicine | Admitting: Internal Medicine

## 2020-02-10 DIAGNOSIS — G44209 Tension-type headache, unspecified, not intractable: Secondary | ICD-10-CM

## 2020-02-10 DIAGNOSIS — I6529 Occlusion and stenosis of unspecified carotid artery: Secondary | ICD-10-CM | POA: Diagnosis not present

## 2020-02-10 DIAGNOSIS — R42 Dizziness and giddiness: Secondary | ICD-10-CM | POA: Diagnosis not present

## 2020-02-18 ENCOUNTER — Other Ambulatory Visit: Payer: Self-pay

## 2020-02-18 ENCOUNTER — Encounter: Payer: Self-pay | Admitting: Cardiology

## 2020-02-18 ENCOUNTER — Ambulatory Visit (INDEPENDENT_AMBULATORY_CARE_PROVIDER_SITE_OTHER): Payer: Medicare Other | Admitting: Cardiology

## 2020-02-18 VITALS — BP 122/80 | HR 86 | Ht 62.0 in | Wt 124.2 lb

## 2020-02-18 DIAGNOSIS — I10 Essential (primary) hypertension: Secondary | ICD-10-CM

## 2020-02-18 DIAGNOSIS — R002 Palpitations: Secondary | ICD-10-CM

## 2020-02-18 NOTE — Patient Instructions (Signed)
Medication Instructions:  Continue current medications  *If you need a refill on your cardiac medications before your next appointment, please call your pharmacy*   Lab Work: None Ordered   Testing/Procedures: None Ordered   Follow-Up: At [facility], you and your health needs are our priority.  As part of our continuing mission to provide you with exceptional heart care, we have created designated Provider Care Teams.  These Care Teams include your primary Cardiologist (physician) and Advanced Practice Providers (APPs -  Physician Assistants and Nurse Practitioners) who all work together to provide you with the care you need, when you need it.  We recommend signing up for the patient portal called "MyChart".  Sign up information is provided on this After Visit Summary.  MyChart is used to connect with patients for Virtual Visits (Telemedicine).  Patients are able to view lab/test results, encounter notes, upcoming appointments, etc.  Non-urgent messages can be sent to your provider as well.   To learn more about what you can do with MyChart, go to [email or web address].    Your next appointment:   6 month(s)  The format for your next appointment:   In Person  Provider:   You may see Kawehi Hostetter Kelly, MD or one of the following Advanced Practice Providers on your designated Care Team:    Hao Meng, PA-C  Angela Duke, PA-C or   Krista Kroeger, PA-C     

## 2020-02-18 NOTE — Progress Notes (Signed)
Wendy Lopez is in the office today for follow-up of her monitor results.  Her monitor has been received by the monitor company but the results are not downloaded yet.  The patient continues to have complaints of intermittent dizziness and headaches.  She says her primary care provider sent her for a CT scan of her head which was normal.  Her exam today is normal, her lungs are clear, cardiac rhythm regular, and vital signs stable with a blood pressure 122/80 and heart rate of 84.  I did not charge Ms. Urich for today's visit since there were no monitor results available, I will review these when available and contact the patient.  Kerin Ransom PA-C 02/18/2020 3:18 PM

## 2020-02-27 ENCOUNTER — Ambulatory Visit: Payer: Medicare Other | Admitting: Neurology

## 2020-02-27 NOTE — Progress Notes (Deleted)
PATIENT: Wendy Lopez DOB: [birth year]  REASON FOR VISIT: follow up HISTORY FROM: patient  HISTORY OF PRESENT ILLNESS: Today 02/27/20 Wendy Lopez is a 59 year old female history of seizures.  Last seizure was end of 2020, she had awakened and had bitten her tongue.  At that time, Dilantin was increased.  She remains on brand-name Dilantin and generic Keppra, has a Licensed conveyancer of Keppra (Lupen) that works well for her.  Is highly allergic to common filler and generic medications.  Has been on brand name Keppra, but insurance did not pay. HISTORY 06/05/2019 SS: Wendy Lopez is a 59 year old female with history of seizures.  When last seen, she had likely seizure episode, when she had awakened and had bitten her tongue.  Her Dilantin level was increased with a 30 mg capsule, she remains on Keppra.  She indicates since increasing her dose of Dilantin, she is actually feeling a lot better, has been having more energy, she is very pleased.  She is taking generic Keppra, has a Licensed conveyancer (Lupen) that works for her.  She says she is highly allergic to a common filler in generic medications.  Her allergy has been so significant, at times has had to use EpiPen.  She has not had recurrent seizure.  She is working with pharmacy, unsure, how much more medication she will be able to receive from this manufacturer.  In the past, she has been on brand-name Keppra, but insurance did not pay.  She presents today for follow-up unaccompanied.  REVIEW OF SYSTEMS: Out of a complete 14 system review of symptoms, the patient complains only of the following symptoms, and all other reviewed systems are negative.  ALLERGIES: Allergies  Allergen Reactions  . Carbamazepine Anaphylaxis  . Lamictal [Lamotrigine] Anaphylaxis    SOB, Lamotrigine only  . Other     STATES HAS SHORTNESS OF BREATH WITH GENERIC DRUGS     ALSO RASH AND ITCHING  . Pseudoephedrine Hcl Er Other (See Comments)    SEIZURES.   . Valium Other (See Comments)    Hallucinations, spasticity, hyper-relfexive   . Amlodipine Rash    Face swell  . Oxycodone Nausea And Vomiting and Rash  . Prednisone Rash and Other (See Comments)    Prescribing MD told patient he thinks dose (dose pack) was "just too high." 03/09/11   STATES HAS TAKEN SMALLER DOSE AND STILL HAD RASH AND JITTERINESS  . Ibuprofen Swelling  . Percocet [Oxycodone-Acetaminophen] Nausea And Vomiting  . Betadine [Povidone Iodine] Rash  . Latex Dermatitis and Rash  . Shellfish Allergy Swelling    HOME MEDICATIONS: Outpatient Medications Prior to Visit  Medication Sig Dispense Refill  . aspirin-acetaminophen-caffeine (EXCEDRIN EXTRA STRENGTH) 250-250-65 MG per tablet Take 1 tablet by mouth as needed. Pain     . cyanocobalamin 100 MCG tablet Take 100 mcg by mouth daily.    Marland Kitchen DILANTIN 100 MG ER capsule TAKE 1 CAPSULE(100 MG) BY MOUTH THREE TIMES DAILY 270 capsule 3  . DILANTIN 30 MG ER capsule TAKE 1 CAPSULE(30 MG) BY MOUTH DAILY 90 capsule 1  . levETIRAcetam (KEPPRA) 500 MG tablet TAKE 1 TABLET BY MOUTH EVERY MORNING AND 2 TABLETS IN THE EVENING 270 tablet 3  . metoprolol succinate (TOPROL-XL) 100 MG 24 hr tablet TAKE 1 TABLET BY MOUTH TWICE DAILY. TAKE WITH OR IMMEDIATELY FOLLOWING A MEAL. (Patient taking differently: Pt takes 1 tab daily) 180 tablet 2  . Misc. Devices MISC Please provide patient with right breast prosthesis and 6  mastectomy bras sue to a history of right breast cancer and mastectomy. (ICD10- C50.111) 6 each 0  . pantoprazole (PROTONIX) 20 MG tablet Take 20 mg by mouth daily.    . Vitamin D, Ergocalciferol, (DRISDOL) 1.25 MG (50000 UT) CAPS capsule Take 50,000 Units by mouth every 7 (seven) days.     No facility-administered medications prior to visit.    PAST MEDICAL HISTORY: Past Medical History:  Diagnosis Date  . Anxiety and depression   . Arthritis   . Breast cancer ([place]) 02/13/2009  . Cancer of right breast ([place]) 04/16/2012  .  Depression   . Depression 2014  . Dysrhythmia    palpitations  . Epilepsy ([place])    last seizure 1990's  . H/O hiatal hernia   . Localization-related (focal) (partial) epilepsy and epileptic syndromes with complex partial seizures, without mention of intractable epilepsy 02/07/2013  . Memory deficit 02/07/2013  . Memory loss   . Mitral valve prolapse 06/01/10   echo- EF>55% mild tricupsid regurgitation. There is mild Pulmonary Hypertension.. Right  ventricular systolic pressure iselevated at 30-40 mmHg. LV systolic function is normal.  . Osteopenia   . PONV (postoperative nausea and vomiting)   . SOB (shortness of breath) 10/18/11   met-test  . Spinal fusion failure    3    PAST SURGICAL HISTORY: Past Surgical History:  Procedure Laterality Date  . BACK SURGERY     screws- lumbar  . BREAST SURGERY  2010   mastectomy - RIGHT  . COLONOSCOPY WITH PROPOFOL  03/13/2012   Procedure: COLONOSCOPY WITH PROPOFOL;  Surgeon: Garlan Fair, MD;  Location: [facility];  Service: Endoscopy;  Laterality: N/A;  . ESOPHAGOGASTRODUODENOSCOPY  03/13/2012   Procedure: ESOPHAGOGASTRODUODENOSCOPY (EGD);  Surgeon: Garlan Fair, MD;  Location: [facility];  Service: Endoscopy;  Laterality: N/A;  Reflux, Hiatal Hernia  . HAND TENDON SURGERY  2012   tendons released in right hand   . MASTECTOMY, RADICAL  02/13/2009  . [place], 2003, 2005  . TONSILLECTOMY      FAMILY HISTORY: Family History  Problem Relation Age of Onset  . Arthritis Mother        rhuematoid  . Heart disease Mother        Atrial fib  . Heart disease Father   . Heart disease Sister        Atrial fib    SOCIAL HISTORY: Social History   Socioeconomic History  . Marital status: Married    Spouse name: Not on file  . Number of children: 1  . Years of education: college 2  . Highest education level: Not on file  Occupational History  . Occupation: unemployed    Fish farm manager: UNEMPLOYED    Comment: 2003   Tobacco Use  . Smoking status: Current Every Day Smoker    Packs/day: 1.00    Years: 43.00    Pack years: 43.00    Types: Cigarettes  . Smokeless tobacco: Never Used  . Tobacco comment: 1/2 pk per day  Substance and Sexual Activity  . Alcohol use: No    Alcohol/week: 0.0 standard drinks  . Drug use: No  . Sexual activity: Not on file  Other Topics Concern  . Not on file  Social History Narrative   Patient is married with one child.   Patient is right handed.   Patient has 2 yrs of education.   Patient does not drink caffeine.   Social Determinants of Health   Financial Resource Strain:   .  Difficulty of Paying Living Expenses: Not on file  Food Insecurity:   . Worried About Charity fundraiser in the Last Year: Not on file  . Ran Out of Food in the Last Year: Not on file  Transportation Needs:   . Lack of Transportation (Medical): Not on file  . Lack of Transportation (Non-Medical): Not on file  Physical Activity:   . Days of Exercise per Week: Not on file  . Minutes of Exercise per Session: Not on file  Stress:   . Feeling of Stress : Not on file  Social Connections:   . Frequency of Communication with Friends and Family: Not on file  . Frequency of Social Gatherings with Friends and Family: Not on file  . Attends Religious Services: Not on file  . Active Member of Clubs or Organizations: Not on file  . Attends Archivist Meetings: Not on file  . Marital Status: Not on file  Intimate Partner Violence:   . Fear of Current or Ex-Partner: Not on file  . Emotionally Abused: Not on file  . Physically Abused: Not on file  . Sexually Abused: Not on file      PHYSICAL EXAM  There were no vitals filed for this visit. There is no height or weight on file to calculate BMI.  Generalized: Well developed, in no acute distress   Neurological examination  Mentation: Alert oriented to time, place, history taking. Follows all commands speech and language  fluent Cranial nerve II-XII: Pupils were equal round reactive to light. Extraocular movements were full, visual field were full on confrontational test. Facial sensation and strength were normal. Uvula tongue midline. Head turning and shoulder shrug  were normal and symmetric. Motor: The motor testing reveals 5 over 5 strength of all 4 extremities. Good symmetric motor tone is noted throughout.  Sensory: Sensory testing is intact to soft touch on all 4 extremities. No evidence of extinction is noted.  Coordination: Cerebellar testing reveals good finger-nose-finger and heel-to-shin bilaterally.  Gait and station: Gait is normal. Tandem gait is normal. Romberg is negative. No drift is seen.  Reflexes: Deep tendon reflexes are symmetric and normal bilaterally.   DIAGNOSTIC DATA (LABS, IMAGING, TESTING) - I reviewed patient records, labs, notes, testing and imaging myself where available.  Lab Results  Component Value Date   WBC 7.1 06/05/2019   HGB 16.5 (H) 06/05/2019   HCT 47.3 (H) 06/05/2019   MCV 90 06/05/2019   PLT 156 06/05/2019      Component Value Date/Time   NA 144 06/05/2019 1409   K 4.5 06/05/2019 1409   CL 106 06/05/2019 1409   CO2 25 06/05/2019 1409   GLUCOSE 89 06/05/2019 1409   GLUCOSE 111 (H) 11/20/2018 1101   BUN 12 06/05/2019 1409   CREATININE 1.00 06/05/2019 1409   CALCIUM 8.9 06/05/2019 1409   PROT 6.6 06/05/2019 1409   ALBUMIN 4.3 06/05/2019 1409   AST 16 06/05/2019 1409   ALT 19 06/05/2019 1409   ALKPHOS 138 (H) 06/05/2019 1409   BILITOT 0.2 06/05/2019 1409   GFRNONAA 62 06/05/2019 1409   GFRAA 71 06/05/2019 1409   Lab Results  Component Value Date   CHOL 170 02/17/2017   HDL 70 02/17/2017   LDLCALC 87 02/17/2017   TRIG 65 02/17/2017   CHOLHDL 2.4 02/17/2017   No results found for: HGBA1C Lab Results  Component Value Date   VITAMINB12 292 11/21/2017   Lab Results  Component Value Date   TSH  0.842 04/06/2018      ASSESSMENT AND PLAN 59  y.o. year old female  has a past medical history of Anxiety and depression, Arthritis, Breast cancer ([place]) (02/13/2009), Cancer of right breast ([place]) (04/16/2012), Depression, Depression (2014), Dysrhythmia, Epilepsy ([place]), H/O hiatal hernia, Localization-related (focal) (partial) epilepsy and epileptic syndromes with complex partial seizures, without mention of intractable epilepsy (02/07/2013), Memory deficit (02/07/2013), Memory loss, Mitral valve prolapse (06/01/10), Osteopenia, PONV (postoperative nausea and vomiting), SOB (shortness of breath) (10/18/11), and Spinal fusion failure. here with ***   I spent 15 minutes with the patient. 50% of this time was spent   Butler Denmark, [place], DNP 02/27/2020, 5:31 AM [facility] [address] [place], [place] [address] [phone]

## 2020-02-28 DIAGNOSIS — R42 Dizziness and giddiness: Secondary | ICD-10-CM | POA: Diagnosis not present

## 2020-03-02 ENCOUNTER — Encounter: Payer: Self-pay | Admitting: Neurology

## 2020-03-02 ENCOUNTER — Ambulatory Visit (INDEPENDENT_AMBULATORY_CARE_PROVIDER_SITE_OTHER): Payer: Medicare Other | Admitting: Neurology

## 2020-03-02 VITALS — BP 118/82 | HR 70 | Ht 62.0 in | Wt 131.6 lb

## 2020-03-02 DIAGNOSIS — R42 Dizziness and giddiness: Secondary | ICD-10-CM | POA: Diagnosis not present

## 2020-03-02 DIAGNOSIS — G40209 Localization-related (focal) (partial) symptomatic epilepsy and epileptic syndromes with complex partial seizures, not intractable, without status epilepticus: Secondary | ICD-10-CM

## 2020-03-02 NOTE — Progress Notes (Signed)
I have read the note, and I agree with the clinical assessment and plan.  Brynne Doane K Nekeisha Aure   

## 2020-03-02 NOTE — Progress Notes (Signed)
PATIENT: Wendy Lopez DOB: 1960-11-18  REASON FOR VISIT: follow up HISTORY FROM: patient  HISTORY OF PRESENT ILLNESS: Today 03/02/20 Wendy Lopez is a 59 year old female with history of seizures, last seizure was in August 2020, she had awakened and had bitten her tongue, her Dilantin was increased.  She is on Dilantin and Keppra.  She is highly allergic to, filler in generic medications, but is tolerating generic Keppra, has found a specific manufacturer (Lupen) works well for her.  Today, reports return of her migraines, that she was experiencing her 30s, also dizziness (floaty sensation), hard to say exactly when it started, but probably around May.  Headache is bilateral, pounding, photophobia, phonophobia, nausea, occasional vomiting.  Reports daily headache, usually not in the morning, could be anytime throughout the day.  Unable to identify triggers.  Headache and dizziness are related.  Taking daily Excedrin Migraine.  Usually dizziness is brought on by movement on the screen (TV/phone), pending appointment with ophthalmology.  Has seen PCP, CT head in October was unremarkable.  Since June, noted decreased sensation/numbness to her right lower face, no facial droop or slurred speech.  Has had several falls related to dizziness.  No reported changes in medications.  Has seen cardiology, heart monitor is pending.  Had Covid vaccine 2nd dose May 10th, that night, had fever 103, shaking, had dj vu spell, which can be associated with seizure.  Had a tremor in both hands for 8 weeks, that went away.  Presents today for evaluation unaccompanied.  HISTORY 06/05/2019 SS: [name] is a 59 year old female with history of seizures.  When last seen, she had likely seizure episode, when she had awakened and had bitten her tongue.  Her Dilantin level was increased with a 30 mg capsule, she remains on Keppra.  She indicates since increasing her dose of Dilantin, she is actually feeling a lot better,  has been having more energy, she is very pleased.  She is taking generic Keppra, has a Licensed conveyancer (Lupen) that works for her.  She says she is highly allergic to a common filler in generic medications.  Her allergy has been so significant, at times has had to use EpiPen.  She has not had recurrent seizure.  She is working with pharmacy, unsure, how much more medication she will be able to receive from this manufacturer.  In the past, she has been on brand-name Keppra, but insurance did not pay.  She presents today for follow-up unaccompanied.   REVIEW OF SYSTEMS: Out of a complete 14 system review of symptoms, the patient complains only of the following symptoms, and all other reviewed systems are negative.  Headache, dizziness, seizures  ALLERGIES: Allergies  Allergen Reactions  . Carbamazepine Anaphylaxis  . Lamictal [Lamotrigine] Anaphylaxis    SOB, Lamotrigine only  . Other     STATES HAS SHORTNESS OF BREATH WITH GENERIC DRUGS     ALSO RASH AND ITCHING  . Pseudoephedrine Hcl Er Other (See Comments)    SEIZURES.  . Valium Other (See Comments)    Hallucinations, spasticity, hyper-relfexive   . Amlodipine Rash    Face swell  . Oxycodone Nausea And Vomiting and Rash  . Prednisone Rash and Other (See Comments)    Prescribing MD told patient he thinks dose (dose pack) was "just too high." 03/09/11   STATES HAS TAKEN SMALLER DOSE AND STILL HAD RASH AND JITTERINESS  . Ibuprofen Swelling  . Percocet [Oxycodone-Acetaminophen] Nausea And Vomiting  . Betadine [Povidone Iodine] Rash  .  Latex Dermatitis and Rash  . Shellfish Allergy Swelling    HOME MEDICATIONS: Outpatient Medications Prior to Visit  Medication Sig Dispense Refill  . aspirin-acetaminophen-caffeine (EXCEDRIN EXTRA STRENGTH) 250-250-65 MG per tablet Take 1 tablet by mouth as needed. Pain     . cyanocobalamin 100 MCG tablet Take 100 mcg by mouth daily.    Marland Kitchen DILANTIN 100 MG ER capsule TAKE 1 CAPSULE(100 MG) BY MOUTH  THREE TIMES DAILY 270 capsule 3  . DILANTIN 30 MG ER capsule TAKE 1 CAPSULE(30 MG) BY MOUTH DAILY 90 capsule 1  . levETIRAcetam (KEPPRA) 500 MG tablet TAKE 1 TABLET BY MOUTH EVERY MORNING AND 2 TABLETS IN THE EVENING 270 tablet 3  . metoprolol succinate (TOPROL-XL) 100 MG 24 hr tablet TAKE 1 TABLET BY MOUTH TWICE DAILY. TAKE WITH OR IMMEDIATELY FOLLOWING A MEAL. (Patient taking differently: Pt takes 1 tab daily) 180 tablet 2  . Misc. Devices MISC Please provide patient with right breast prosthesis and 6 mastectomy bras sue to a history of right breast cancer and mastectomy. (ICD10- C50.111) 6 each 0  . pantoprazole (PROTONIX) 20 MG tablet Take 20 mg by mouth daily.    . Vitamin D, Ergocalciferol, (DRISDOL) 1.25 MG (50000 UT) CAPS capsule Take 50,000 Units by mouth every 7 (seven) days.     No facility-administered medications prior to visit.    PAST MEDICAL HISTORY: Past Medical History:  Diagnosis Date  . Anxiety and depression   . Arthritis   . Breast cancer ([place]) 02/13/2009  . Cancer of right breast ([place]) 04/16/2012  . Depression   . Depression 2014  . Dysrhythmia    palpitations  . Epilepsy ([place])    last seizure 1990's  . H/O hiatal hernia   . Localization-related (focal) (partial) epilepsy and epileptic syndromes with complex partial seizures, without mention of intractable epilepsy 02/07/2013  . Memory deficit 02/07/2013  . Memory loss   . Mitral valve prolapse 06/01/10   echo- EF>55% mild tricupsid regurgitation. There is mild Pulmonary Hypertension.. Right  ventricular systolic pressure iselevated at 30-40 mmHg. LV systolic function is normal.  . Osteopenia   . PONV (postoperative nausea and vomiting)   . SOB (shortness of breath) 10/18/11   met-test  . Spinal fusion failure    3    PAST SURGICAL HISTORY: Past Surgical History:  Procedure Laterality Date  . BACK SURGERY     screws- lumbar  . BREAST SURGERY  2010   mastectomy - RIGHT  . COLONOSCOPY WITH PROPOFOL   03/13/2012   Procedure: COLONOSCOPY WITH PROPOFOL;  Surgeon: Garlan Fair, MD;  Location: [facility];  Service: Endoscopy;  Laterality: N/A;  . ESOPHAGOGASTRODUODENOSCOPY  03/13/2012   Procedure: ESOPHAGOGASTRODUODENOSCOPY (EGD);  Surgeon: Garlan Fair, MD;  Location: [facility];  Service: Endoscopy;  Laterality: N/A;  Reflux, Hiatal Hernia  . HAND TENDON SURGERY  2012   tendons released in right hand   . MASTECTOMY, RADICAL  02/13/2009  . [place], 2003, 2005  . TONSILLECTOMY      FAMILY HISTORY: Family History  Problem Relation Age of Onset  . Arthritis Mother        rhuematoid  . Heart disease Mother        Atrial fib  . Heart disease Father   . Heart disease Sister        Atrial fib    SOCIAL HISTORY: Social History   Socioeconomic History  . Marital status: Married    Spouse name: Not on  file  . Number of children: 1  . Years of education: college 2  . Highest education level: Not on file  Occupational History  . Occupation: unemployed    Fish farm manager: UNEMPLOYED    Comment: 2003  Tobacco Use  . Smoking status: Current Every Day Smoker    Packs/day: 1.00    Years: 43.00    Pack years: 43.00    Types: Cigarettes  . Smokeless tobacco: Never Used  . Tobacco comment: 1/2 pk per day  Substance and Sexual Activity  . Alcohol use: No    Alcohol/week: 0.0 standard drinks  . Drug use: No  . Sexual activity: Not on file  Other Topics Concern  . Not on file  Social History Narrative   Patient is married with one child.   Patient is right handed.   Patient has 2 yrs of education.   Patient does not drink caffeine.   Social Determinants of Health   Financial Resource Strain:   . Difficulty of Paying Living Expenses: Not on file  Food Insecurity:   . Worried About Charity fundraiser in the Last Year: Not on file  . Ran Out of Food in the Last Year: Not on file  Transportation Needs:   . Lack of Transportation (Medical): Not on file  . Lack  of Transportation (Non-Medical): Not on file  Physical Activity:   . Days of Exercise per Week: Not on file  . Minutes of Exercise per Session: Not on file  Stress:   . Feeling of Stress : Not on file  Social Connections:   . Frequency of Communication with Friends and Family: Not on file  . Frequency of Social Gatherings with Friends and Family: Not on file  . Attends Religious Services: Not on file  . Active Member of Clubs or Organizations: Not on file  . Attends Archivist Meetings: Not on file  . Marital Status: Not on file  Intimate Partner Violence:   . Fear of Current or Ex-Partner: Not on file  . Emotionally Abused: Not on file  . Physically Abused: Not on file  . Sexually Abused: Not on file   PHYSICAL EXAM  Vitals:   03/02/20 0841  BP: 118/82  Pulse: 70  Weight: 131 lb 9.6 oz (59.7 kg)  Height: 5' 2"  (1.575 m)   Body mass index is 24.07 kg/m.  Generalized: Well developed, in no acute distress   Neurological examination  Mentation: Alert oriented to time, place, history taking. Follows all commands speech and language fluent Cranial nerve II-XII: Pupils were equal round reactive to light. Extraocular movements were full, visual field were full on confrontational test. Facial sensation and strength were normal. Head turning and shoulder shrug  were normal and symmetric. Motor: The motor testing reveals 5 over 5 strength of all 4 extremities. Good symmetric motor tone is noted throughout.  Sensory: Sensory testing is intact to soft touch on all 4 extremities. No evidence of extinction is noted.  Coordination: Cerebellar testing reveals good finger-nose-finger and heel-to-shin bilaterally.  Gait and station: Gait is normal. Tandem gait is slightly unsteady. Romberg is negative. No drift is seen.  Reflexes: Deep tendon reflexes are symmetric and normal bilaterally.   DIAGNOSTIC DATA (LABS, IMAGING, TESTING) - I reviewed patient records, labs, notes, testing  and imaging myself where available.  Lab Results  Component Value Date   WBC 7.1 06/05/2019   HGB 16.5 (H) 06/05/2019   HCT 47.3 (H) 06/05/2019   MCV 90  06/05/2019   PLT 156 06/05/2019      Component Value Date/Time   NA 144 06/05/2019 1409   K 4.5 06/05/2019 1409   CL 106 06/05/2019 1409   CO2 25 06/05/2019 1409   GLUCOSE 89 06/05/2019 1409   GLUCOSE 111 (H) 11/20/2018 1101   BUN 12 06/05/2019 1409   CREATININE 1.00 06/05/2019 1409   CALCIUM 8.9 06/05/2019 1409   PROT 6.6 06/05/2019 1409   ALBUMIN 4.3 06/05/2019 1409   AST 16 06/05/2019 1409   ALT 19 06/05/2019 1409   ALKPHOS 138 (H) 06/05/2019 1409   BILITOT 0.2 06/05/2019 1409   GFRNONAA 62 06/05/2019 1409   GFRAA 71 06/05/2019 1409   Lab Results  Component Value Date   CHOL 170 02/17/2017   HDL 70 02/17/2017   LDLCALC 87 02/17/2017   TRIG 65 02/17/2017   CHOLHDL 2.4 02/17/2017   No results found for: HGBA1C Lab Results  Component Value Date   VITAMINB12 292 11/21/2017   Lab Results  Component Value Date   TSH 0.842 04/06/2018    ASSESSMENT AND PLAN 59 y.o. year old female  has a past medical history of Anxiety and depression, Arthritis, Breast cancer ([place]) (02/13/2009), Cancer of right breast ([place]) (04/16/2012), Depression, Depression (2014), Dysrhythmia, Epilepsy ([place]), H/O hiatal hernia, Localization-related (focal) (partial) epilepsy and epileptic syndromes with complex partial seizures, without mention of intractable epilepsy (02/07/2013), Memory deficit (02/07/2013), Memory loss, Mitral valve prolapse (06/01/10), Osteopenia, PONV (postoperative nausea and vomiting), SOB (shortness of breath) (10/18/11), and Spinal fusion failure. here with:  1.  History of seizures 2.  Headache 3.  Dizziness -Check routine blood work, Dilantin level, ensure not causing dizziness -Headaches sound migrainous, with history of, but also, potentially contributing to rebound headache with daily Excedrin Migraine -CT head  without contrast October 2021 was unremarkable -Hesitant to try medication, due to reported significant allergy to common fillers and medications -Consider, needs a repeat MRI of the brain? (h/a, dizziness, numbness right lower face), even EEG (questions if seizures causing dizziness) -Cardiac monitor is pending -Possible dj vu spell in May when high fever from Covid vaccine -Follow-up in 3 months or sooner if needed  I spent 30 minutes of face-to-face and non-face-to-face time with patient.  This included previsit chart review, lab review, study review, order entry, electronic health record documentation, patient education.  Butler Denmark, AGNP-C, DNP 03/02/2020, 9:22 AM [facility] [address], [place], [place] [address] [phone]

## 2020-03-02 NOTE — Patient Instructions (Signed)
Let's check blood work today  For now continue current medications Will decide what to do next once labs results See you back in 3 months

## 2020-03-04 ENCOUNTER — Telehealth: Payer: Self-pay | Admitting: Neurology

## 2020-03-04 ENCOUNTER — Telehealth: Payer: Self-pay | Admitting: Cardiology

## 2020-03-04 DIAGNOSIS — R42 Dizziness and giddiness: Secondary | ICD-10-CM

## 2020-03-04 DIAGNOSIS — G8929 Other chronic pain: Secondary | ICD-10-CM

## 2020-03-04 DIAGNOSIS — R519 Headache, unspecified: Secondary | ICD-10-CM

## 2020-03-04 NOTE — Telephone Encounter (Signed)
medicare order sent to GI. No auth they will reach out to the patient to schedule.  

## 2020-03-04 NOTE — Telephone Encounter (Signed)
CBC, CMP show no significant abnormalities, Dilantin level is within normal range, Keppra level is pending.   None of this explains her dizziness and headaches. Did gets results back from heart monitor, palpations explained by brief SVT, is already on metoprolol.  I am inclined to order MRI of the brain due to headache, dizziness, reports of altered sensation to right V3 area. She did have CT head 10/27, that was unremarkable. I am going to go ahead and place the order.    I'd like to try her on a medication for migraine (headaches have migraine features), but is reportedly VERY allergic to filler in generic medications. On brand name Dilantin generic Keppra, but is specific manufacturer.

## 2020-03-04 NOTE — Telephone Encounter (Signed)
I called the patient to review her monitor results.  The patient wore the monitor from October 12 through October 26.  Predominant rhythm was normal sinus in the 80s.  Her slowest heart rate was 54.  She did have brief burst of SVT lasting 6 beats.  The longest episode lasted 15 beats.  There were PACs and couplets although this was less than 1%.  There was no sinus pauses or block and no atrial fibrillation.  I explained to the patient that the monitor explains her intermittent palpitations and she is on the correct treatment for this with metoprolol.  It does not explain her dizziness.  She has seen Dr Jannifer Franklin and I will forward these results to him.   Kerin Ransom PA-C 03/04/2020 9:24 AM

## 2020-03-04 NOTE — Telephone Encounter (Signed)
Results noted

## 2020-03-05 ENCOUNTER — Telehealth: Payer: Self-pay | Admitting: Neurology

## 2020-03-05 MED ORDER — TROKENDI XR 25 MG PO CP24: ORAL_CAPSULE | ORAL | 5 refills | Status: DC

## 2020-03-05 NOTE — Telephone Encounter (Signed)
Pt. states pharmacy is faxing over PA for TROKENDI XR 25 MG CP24.  Pharmacy: [id number]  Pt.'s contact: [phone]

## 2020-03-05 NOTE — Addendum Note (Signed)
Addended by: Suzzanne Cloud on: 03/05/2020 11:05 AM   Modules accepted: Orders

## 2020-03-05 NOTE — Telephone Encounter (Signed)
I called pt and LMVM for her to return call for results.  keppra lab still pending.

## 2020-03-05 NOTE — Telephone Encounter (Signed)
I sent Trokendi XR 25 mg, starting 1 tablet daily for 1 week then take 2 tablets daily. Let's try to get brand name approved.

## 2020-03-05 NOTE — Telephone Encounter (Signed)
Received a PA request for Trokendi XR 25mg . PA was started on [email or web address]. Key is [place]. PA was instantly approved until 04/17/2021. Will fax a copy of the determination to patient's pharmacy.

## 2020-03-05 NOTE — Telephone Encounter (Signed)
Pt returned call, I gave her the results of her labs so far, CBC, CMP no significant abnormalities, Dilantin normal level.  Keppra still pending.  Will call when received results.  I relayed that per SS/NP and Dr. Jannifer Franklin that order for MRI brain.  Start on trokeni XR, pt ok to try generic (has epi-pen if needed).

## 2020-03-05 NOTE — Telephone Encounter (Signed)
Noted  

## 2020-03-06 LAB — COMPREHENSIVE METABOLIC PANEL
ALT: 15 IU/L (ref 0–32)
AST: 14 IU/L (ref 0–40)
Albumin/Globulin Ratio: 1.9 (ref 1.2–2.2)
Albumin: 4.1 g/dL (ref 3.8–4.9)
Alkaline Phosphatase: 139 IU/L — ABNORMAL HIGH (ref 44–121)
BUN/Creatinine Ratio: 16 (ref 12–28)
BUN: 11 mg/dL (ref 8–27)
Bilirubin Total: <0.2 mg/dL (ref 0.0–1.2)
CO2: 26 mmol/L (ref 20–29)
Calcium: 8.9 mg/dL (ref 8.7–10.3)
Chloride: 106 mmol/L (ref 96–106)
Creatinine, Ser: 0.68 mg/dL (ref 0.57–1.00)
GFR calc Af Amer: 110 mL/min/{1.73_m2} (ref >59)
GFR calc non Af Amer: 95 mL/min/{1.73_m2} (ref >59)
Globulin, Total: 2.2 g/dL (ref 1.5–4.5)
Glucose: 95 mg/dL (ref 65–99)
Potassium: 4.5 mmol/L (ref 3.5–5.2)
Sodium: 143 mmol/L (ref 134–144)
Total Protein: 6.3 g/dL (ref 6.0–8.5)

## 2020-03-06 LAB — CBC WITH DIFFERENTIAL/PLATELET
Basophils Absolute: 0.1 10*3/uL (ref 0.0–0.2)
Basos: 1 %
EOS (ABSOLUTE): 0.2 10*3/uL (ref 0.0–0.4)
Eos: 3 %
Hematocrit: 47.3 % — ABNORMAL HIGH (ref 34.0–46.6)
Hemoglobin: 16.5 g/dL — ABNORMAL HIGH (ref 11.1–15.9)
Immature Grans (Abs): 0 10*3/uL (ref 0.0–0.1)
Immature Granulocytes: 0 %
Lymphocytes Absolute: 3.7 10*3/uL — ABNORMAL HIGH (ref 0.7–3.1)
Lymphs: 47 %
MCH: 31.5 pg (ref 26.6–33.0)
MCHC: 34.9 g/dL (ref 31.5–35.7)
MCV: 90 fL (ref 79–97)
Monocytes Absolute: 0.7 10*3/uL (ref 0.1–0.9)
Monocytes: 9 %
Neutrophils Absolute: 3.1 10*3/uL (ref 1.4–7.0)
Neutrophils: 40 %
Platelets: 160 10*3/uL (ref 150–450)
RBC: 5.23 x10E6/uL (ref 3.77–5.28)
RDW: 12.5 % (ref 11.7–15.4)
WBC: 7.7 10*3/uL (ref 3.4–10.8)

## 2020-03-06 LAB — LEVETIRACETAM LEVEL: Levetiracetam Lvl: 29.6 ug/mL (ref 10.0–40.0)

## 2020-03-06 LAB — PHENYTOIN LEVEL, TOTAL: Phenytoin (Dilantin), Serum: 11.6 ug/mL (ref 10.0–20.0)

## 2020-03-10 ENCOUNTER — Ambulatory Visit
Admission: RE | Admit: 2020-03-10 | Discharge: 2020-03-10 | Disposition: A | Discharge status: Home / Self Care | Payer: Medicare Other | Source: Ambulatory Visit | Attending: Neurology | Admitting: Neurology

## 2020-03-10 DIAGNOSIS — R42 Dizziness and giddiness: Secondary | ICD-10-CM

## 2020-03-10 DIAGNOSIS — R519 Headache, unspecified: Secondary | ICD-10-CM | POA: Diagnosis not present

## 2020-03-10 DIAGNOSIS — G8929 Other chronic pain: Secondary | ICD-10-CM | POA: Diagnosis not present

## 2020-03-10 NOTE — Telephone Encounter (Signed)
This noted to be approved Note :   Received a PA request for Trokendi XR 25mg . PA was started on [email or web address]. Key is [place]. PA was instantly approved until 04/17/2021. Will fax a copy of the determination to patient's pharmacy.

## 2020-03-16 ENCOUNTER — Telehealth: Payer: Self-pay | Admitting: Neurology

## 2020-03-16 DIAGNOSIS — R42 Dizziness and giddiness: Secondary | ICD-10-CM

## 2020-03-16 NOTE — Addendum Note (Signed)
Addended by: Brandon Melnick on: 03/16/2020 09:53 AM   Modules accepted: Orders

## 2020-03-16 NOTE — Telephone Encounter (Signed)
Spoke to pt and relayed the MRI brain results per SS/NP.  She is ok to proceed with ENT referral, [facility] (dr Erik Obey, she has seen in the past).  She continues with headaches, has not started on trokendi as yet.  (wanted to see the MRI results first).

## 2020-03-16 NOTE — Telephone Encounter (Signed)
Please call, MRI of the brian shows few white matter areas, are nonspecific, could be related to migraine history? Does show right mastoid effusion, this may be contributing to headache/dizziness, more symptoms on the right side? I know altered sensation to right side reported. Would she be willing to see ENT, if so let's place referral.    IMPRESSION: This MRI of the brain without contrast shows the following: 1.   Few punctate T2/FLAIR hyperintense foci in the subcortical and deep white matter of the hemispheres.  This is a nonspecific finding and most likely is due to minimal chronic microvascular ischemic change or to the sequela of migraine headaches.  None of the foci appear to be acute. 2.   Right mastoid effusion.  This was not clearly seen on the CT scan from 02/10/2020.  It could be due to eustachian tube dysfunction. 3.   Frontal sinuses are underdeveloped.  There is a mucous retention cyst in the left frontal recess but no definite chronic sinusitis.   4.   No acute findings.

## 2020-03-16 NOTE — Addendum Note (Signed)
Addended by: Suzzanne Cloud on: 03/16/2020 10:04 AM   Modules accepted: Orders

## 2020-03-26 DIAGNOSIS — R42 Dizziness and giddiness: Secondary | ICD-10-CM | POA: Diagnosis not present

## 2020-03-26 DIAGNOSIS — H903 Sensorineural hearing loss, bilateral: Secondary | ICD-10-CM | POA: Diagnosis not present

## 2020-03-30 ENCOUNTER — Telehealth: Payer: Self-pay | Admitting: Neurology

## 2020-03-30 NOTE — Telephone Encounter (Signed)
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  Suggested continued follow-up with neurologist, return as needed.  Was referred due to incidental right mastoid effusion.

## 2020-05-28 ENCOUNTER — Other Ambulatory Visit: Payer: Self-pay | Admitting: Neurology

## 2020-06-04 ENCOUNTER — Ambulatory Visit: Payer: Medicare Other | Admitting: Neurology

## 2020-06-04 NOTE — Progress Notes (Deleted)
PATIENT: TRENIKA HUDSON DOB: 1961/02/09  REASON FOR VISIT: follow up HISTORY FROM: patient  HISTORY OF PRESENT ILLNESS: Today 06/04/20 Ms. Gatliff is a 60 year old female with history of seizures, last was August 2020 when she had awakened and had bitten her tongue, her Dilantin was increased.  She is on Dilantin and Keppra.  She is highly allergic to filler and generic medications but is tolerating generic Keppra, has found a specific manufacturer (Lupen that works).  Was last seen complaining of return of her migraines and dizziness.  MRI of the brain showed a few white matter areas that were nonspecific, could be related to history of migraine.  She saw ENT due to MRI showing right mastoid effusion, they felt dizziness was suspicious for vestibular migraine.  HISTORY 03/02/2020 SS: Ms. Klutts is a 60 year old female with history of seizures, last seizure was in August 2020, she had awakened and had bitten her tongue, her Dilantin was increased.  She is on Dilantin and Keppra.  She is highly allergic to, filler in generic medications, but is tolerating generic Keppra, has found a specific manufacturer (Lupen) works well for her.  Today, reports return of her migraines, that she was experiencing her 30s, also dizziness (floaty sensation), hard to say exactly when it started, but probably around May.  Headache is bilateral, pounding, photophobia, phonophobia, nausea, occasional vomiting.  Reports daily headache, usually not in the morning, could be anytime throughout the day.  Unable to identify triggers.  Headache and dizziness are related.  Taking daily Excedrin Migraine.  Usually dizziness is brought on by movement on the screen (TV/phone), pending appointment with ophthalmology.  Has seen PCP, CT head in October was unremarkable.  Since June, noted decreased sensation/numbness to her right lower face, no facial droop or slurred speech.  Has had several falls related to dizziness.  No  reported changes in medications.  Has seen cardiology, heart monitor is pending.  Had Covid vaccine 2nd dose May 10th, that night, had fever 103, shaking, had dj vu spell, which can be associated with seizure.  Had a tremor in both hands for 8 weeks, that went away.  Presents today for evaluation unaccompanied.   REVIEW OF SYSTEMS: Out of a complete 14 system review of symptoms, the patient complains only of the following symptoms, and all other reviewed systems are negative.  ALLERGIES: Allergies  Allergen Reactions  . Carbamazepine Anaphylaxis  . Lamictal [Lamotrigine] Anaphylaxis    SOB, Lamotrigine only  . Other     STATES HAS SHORTNESS OF BREATH WITH GENERIC DRUGS     ALSO RASH AND ITCHING  . Pseudoephedrine Hcl Er Other (See Comments)    SEIZURES.  . Valium Other (See Comments)    Hallucinations, spasticity, hyper-relfexive   . Amlodipine Rash    Face swell  . Oxycodone Nausea And Vomiting and Rash  . Prednisone Rash and Other (See Comments)    Prescribing MD told patient he thinks dose (dose pack) was "just too high." 03/09/11   STATES HAS TAKEN SMALLER DOSE AND STILL HAD RASH AND JITTERINESS  . Ibuprofen Swelling  . Percocet [Oxycodone-Acetaminophen] Nausea And Vomiting  . Betadine [Povidone Iodine] Rash  . Latex Dermatitis and Rash  . Shellfish Allergy Swelling    HOME MEDICATIONS: Outpatient Medications Prior to Visit  Medication Sig Dispense Refill  . aspirin-acetaminophen-caffeine (EXCEDRIN EXTRA STRENGTH) 250-250-65 MG per tablet Take 1 tablet by mouth as needed. Pain     . cyanocobalamin 100 MCG tablet Take 100  mcg by mouth daily.    Marland Kitchen DILANTIN 100 MG ER capsule TAKE 1 CAPSULE(100 MG) BY MOUTH THREE TIMES DAILY 270 capsule 3  . DILANTIN 30 MG ER capsule TAKE 1 CAPSULE(30 MG) BY MOUTH DAILY 90 capsule 1  . levETIRAcetam (KEPPRA) 500 MG tablet TAKE 1 TABLET BY MOUTH EVERY MORNING AND 2 TABLETS IN THE EVENING 270 tablet 3  . metoprolol succinate (TOPROL-XL) 100 MG  24 hr tablet TAKE 1 TABLET BY MOUTH TWICE DAILY. TAKE WITH OR IMMEDIATELY FOLLOWING A MEAL. (Patient taking differently: Pt takes 1 tab daily) 180 tablet 2  . Misc. Devices MISC Please provide patient with right breast prosthesis and 6 mastectomy bras sue to a history of right breast cancer and mastectomy. (ICD10- C50.111) 6 each 0  . pantoprazole (PROTONIX) 20 MG tablet Take 20 mg by mouth daily.    Marland Kitchen TROKENDI XR 25 MG CP24 Take 1 tablet daily for 1 week, then take 2 tablets daily 60 capsule 5  . Vitamin D, Ergocalciferol, (DRISDOL) 1.25 MG (50000 UT) CAPS capsule Take 50,000 Units by mouth every 7 (seven) days.     No facility-administered medications prior to visit.    PAST MEDICAL HISTORY: Past Medical History:  Diagnosis Date  . Anxiety and depression   . Arthritis   . Breast cancer ([place]) 02/13/2009  . Cancer of right breast ([place]) 04/16/2012  . Depression   . Depression 2014  . Dysrhythmia    palpitations  . Epilepsy ([place])    last seizure 1990's  . H/O hiatal hernia   . Localization-related (focal) (partial) epilepsy and epileptic syndromes with complex partial seizures, without mention of intractable epilepsy 02/07/2013  . Memory deficit 02/07/2013  . Memory loss   . Mitral valve prolapse 06/01/10   echo- EF>55% mild tricupsid regurgitation. There is mild Pulmonary Hypertension.. Right  ventricular systolic pressure iselevated at 30-40 mmHg. LV systolic function is normal.  . Osteopenia   . PONV (postoperative nausea and vomiting)   . SOB (shortness of breath) 10/18/11   met-test  . Spinal fusion failure    3    PAST SURGICAL HISTORY: Past Surgical History:  Procedure Laterality Date  . BACK SURGERY     screws- lumbar  . BREAST SURGERY  2010   mastectomy - RIGHT  . COLONOSCOPY WITH PROPOFOL  03/13/2012   Procedure: COLONOSCOPY WITH PROPOFOL;  Surgeon: Garlan Fair, MD;  Location: [facility];  Service: Endoscopy;  Laterality: N/A;  . ESOPHAGOGASTRODUODENOSCOPY   03/13/2012   Procedure: ESOPHAGOGASTRODUODENOSCOPY (EGD);  Surgeon: Garlan Fair, MD;  Location: [facility];  Service: Endoscopy;  Laterality: N/A;  Reflux, Hiatal Hernia  . HAND TENDON SURGERY  2012   tendons released in right hand   . MASTECTOMY, RADICAL  02/13/2009  . [place], 2003, 2005  . TONSILLECTOMY      FAMILY HISTORY: Family History  Problem Relation Age of Onset  . Arthritis Mother        rhuematoid  . Heart disease Mother        Atrial fib  . Heart disease Father   . Heart disease Sister        Atrial fib    SOCIAL HISTORY: Social History   Socioeconomic History  . Marital status: Married    Spouse name: Not on file  . Number of children: 1  . Years of education: college 2  . Highest education level: Not on file  Occupational History  . Occupation: unemployed  Employer: UNEMPLOYED    Comment: 2003  Tobacco Use  . Smoking status: Current Every Day Smoker    Packs/day: 1.00    Years: 43.00    Pack years: 43.00    Types: Cigarettes  . Smokeless tobacco: Never Used  . Tobacco comment: 1/2 pk per day  Substance and Sexual Activity  . Alcohol use: No    Alcohol/week: 0.0 standard drinks  . Drug use: No  . Sexual activity: Not on file  Other Topics Concern  . Not on file  Social History Narrative   Patient is married with one child.   Patient is right handed.   Patient has 2 yrs of education.   Patient does not drink caffeine.   Social Determinants of Health   Financial Resource Strain: Not on file  Food Insecurity: Not on file  Transportation Needs: Not on file  Physical Activity: Not on file  Stress: Not on file  Social Connections: Not on file  Intimate Partner Violence: Not on file      PHYSICAL EXAM  There were no vitals filed for this visit. There is no height or weight on file to calculate BMI.  Generalized: Well developed, in no acute distress   Neurological examination  Mentation: Alert oriented to time,  place, history taking. Follows all commands speech and language fluent Cranial nerve II-XII: Pupils were equal round reactive to light. Extraocular movements were full, visual field were full on confrontational test. Facial sensation and strength were normal. Uvula tongue midline. Head turning and shoulder shrug  were normal and symmetric. Motor: The motor testing reveals 5 over 5 strength of all 4 extremities. Good symmetric motor tone is noted throughout.  Sensory: Sensory testing is intact to soft touch on all 4 extremities. No evidence of extinction is noted.  Coordination: Cerebellar testing reveals good finger-nose-finger and heel-to-shin bilaterally.  Gait and station: Gait is normal. Tandem gait is normal. Romberg is negative. No drift is seen.  Reflexes: Deep tendon reflexes are symmetric and normal bilaterally.   DIAGNOSTIC DATA (LABS, IMAGING, TESTING) - I reviewed patient records, labs, notes, testing and imaging myself where available.  Lab Results  Component Value Date   WBC 7.7 03/02/2020   HGB 16.5 (H) 03/02/2020   HCT 47.3 (H) 03/02/2020   MCV 90 03/02/2020   PLT 160 03/02/2020      Component Value Date/Time   NA 143 03/02/2020 0917   K 4.5 03/02/2020 0917   CL 106 03/02/2020 0917   CO2 26 03/02/2020 0917   GLUCOSE 95 03/02/2020 0917   GLUCOSE 111 (H) 11/20/2018 1101   BUN 11 03/02/2020 0917   CREATININE 0.68 03/02/2020 0917   CALCIUM 8.9 03/02/2020 0917   PROT 6.3 03/02/2020 0917   ALBUMIN 4.1 03/02/2020 0917   AST 14 03/02/2020 0917   ALT 15 03/02/2020 0917   ALKPHOS 139 (H) 03/02/2020 0917   BILITOT <0.2 03/02/2020 0917   GFRNONAA 95 03/02/2020 0917   GFRAA 110 03/02/2020 0917   Lab Results  Component Value Date   CHOL 170 02/17/2017   HDL 70 02/17/2017   LDLCALC 87 02/17/2017   TRIG 65 02/17/2017   CHOLHDL 2.4 02/17/2017   No results found for: HGBA1C Lab Results  Component Value Date   VITAMINB12 292 11/21/2017   Lab Results  Component Value  Date   TSH 0.842 04/06/2018      ASSESSMENT AND PLAN 60 y.o. year old female  has a past medical history of Anxiety and  depression, Arthritis, Breast cancer ([place]) (02/13/2009), Cancer of right breast ([place]) (04/16/2012), Depression, Depression (2014), Dysrhythmia, Epilepsy ([place]), H/O hiatal hernia, Localization-related (focal) (partial) epilepsy and epileptic syndromes with complex partial seizures, without mention of intractable epilepsy (02/07/2013), Memory deficit (02/07/2013), Memory loss, Mitral valve prolapse (06/01/10), Osteopenia, PONV (postoperative nausea and vomiting), SOB (shortness of breath) (10/18/11), and Spinal fusion failure. here with ***   I spent 15 minutes with the patient. 50% of this time was spent   Butler Denmark, Burton, DNP 06/04/2020, 5:40 AM [facility] [address], [place], [place] [address] [phone]

## 2020-08-05 ENCOUNTER — Ambulatory Visit: Payer: Medicare Other | Admitting: Neurology

## 2020-08-05 NOTE — Progress Notes (Deleted)
PATIENT: Wendy Lopez DOB: [birth year]  REASON FOR VISIT: follow up HISTORY FROM: patient  HISTORY OF PRESENT ILLNESS: Today 08/05/20 Aldea Avis is a 60 year old female with history of seizures, last in August 2020, on Dilantin and Keppra.  Highly allergic to filler and generic medication, doing okay on generic Keppra, on specific manufacturer (Lupen).  Last visit reported dizziness, numbness/decreased sensation to right lower face, sent for MRI of the brain, showed few white matter areas, nonspecific, right mastoid effusion. Saw ENT.   Update 03/02/2020 SS: Ms. Sacca is a 60 year old female with history of seizures, last seizure was in August 2020, she had awakened and had bitten her tongue, her Dilantin was increased.  She is on Dilantin and Keppra.  She is highly allergic to, filler in generic medications, but is tolerating generic Keppra, has found a specific manufacturer (Lupen) works well for her.  Today, reports return of her migraines, that she was experiencing her 30s, also dizziness (floaty sensation), hard to say exactly when it started, but probably around May.  Headache is bilateral, pounding, photophobia, phonophobia, nausea, occasional vomiting.  Reports daily headache, usually not in the morning, could be anytime throughout the day.  Unable to identify triggers.  Headache and dizziness are related.  Taking daily Excedrin Migraine.  Usually dizziness is brought on by movement on the screen (TV/phone), pending appointment with ophthalmology.  Has seen PCP, CT head in October was unremarkable.  Since June, noted decreased sensation/numbness to her right lower face, no facial droop or slurred speech.  Has had several falls related to dizziness.  No reported changes in medications.  Has seen cardiology, heart monitor is pending.  Had Covid vaccine 2nd dose May 10th, that night, had fever 103, shaking, had dj vu spell, which can be associated with seizure.  Had a tremor in both  hands for 8 weeks, that went away.  Presents today for evaluation unaccompanied.  HISTORY 06/05/2019 SS: Ms. Pederson is a 60 year old female with history of seizures.  When last seen, she had likely seizure episode, when she had awakened and had bitten her tongue.  Her Dilantin level was increased with a 30 mg capsule, she remains on Keppra.  She indicates since increasing her dose of Dilantin, she is actually feeling a lot better, has been having more energy, she is very pleased.  She is taking generic Keppra, has a Licensed conveyancer (Lupen) that works for her.  She says she is highly allergic to a common filler in generic medications.  Her allergy has been so significant, at times has had to use EpiPen.  She has not had recurrent seizure.  She is working with pharmacy, unsure, how much more medication she will be able to receive from this manufacturer.  In the past, she has been on brand-name Keppra, but insurance did not pay.  She presents today for follow-up unaccompanied.   REVIEW OF SYSTEMS: Out of a complete 14 system review of symptoms, the patient complains only of the following symptoms, and all other reviewed systems are negative.  Headache, dizziness, seizures  ALLERGIES: Allergies  Allergen Reactions  . Carbamazepine Anaphylaxis  . Lamictal [Lamotrigine] Anaphylaxis    SOB, Lamotrigine only  . Other     STATES HAS SHORTNESS OF BREATH WITH GENERIC DRUGS     ALSO RASH AND ITCHING  . Pseudoephedrine Hcl Er Other (See Comments)    SEIZURES.  . Valium Other (See Comments)    Hallucinations, spasticity, hyper-relfexive   . Amlodipine Rash  Face swell  . Oxycodone Nausea And Vomiting and Rash  . Prednisone Rash and Other (See Comments)    Prescribing MD told patient he thinks dose (dose pack) was "just too high." 03/09/11   STATES HAS TAKEN SMALLER DOSE AND STILL HAD RASH AND JITTERINESS  . Ibuprofen Swelling  . Percocet [Oxycodone-Acetaminophen] Nausea And Vomiting  .  Betadine [Povidone Iodine] Rash  . Latex Dermatitis and Rash  . Shellfish Allergy Swelling    HOME MEDICATIONS: Outpatient Medications Prior to Visit  Medication Sig Dispense Refill  . aspirin-acetaminophen-caffeine (EXCEDRIN EXTRA STRENGTH) 250-250-65 MG per tablet Take 1 tablet by mouth as needed. Pain     . cyanocobalamin 100 MCG tablet Take 100 mcg by mouth daily.    Marland Kitchen DILANTIN 100 MG ER capsule TAKE 1 CAPSULE(100 MG) BY MOUTH THREE TIMES DAILY 270 capsule 3  . DILANTIN 30 MG ER capsule TAKE 1 CAPSULE(30 MG) BY MOUTH DAILY 90 capsule 1  . levETIRAcetam (KEPPRA) 500 MG tablet TAKE 1 TABLET BY MOUTH EVERY MORNING AND 2 TABLETS IN THE EVENING 270 tablet 3  . metoprolol succinate (TOPROL-XL) 100 MG 24 hr tablet TAKE 1 TABLET BY MOUTH TWICE DAILY. TAKE WITH OR IMMEDIATELY FOLLOWING A MEAL. (Patient taking differently: Pt takes 1 tab daily) 180 tablet 2  . Misc. Devices MISC Please provide patient with right breast prosthesis and 6 mastectomy bras sue to a history of right breast cancer and mastectomy. (ICD10- C50.111) 6 each 0  . pantoprazole (PROTONIX) 20 MG tablet Take 20 mg by mouth daily.    Marland Kitchen TROKENDI XR 25 MG CP24 Take 1 tablet daily for 1 week, then take 2 tablets daily 60 capsule 5  . Vitamin D, Ergocalciferol, (DRISDOL) 1.25 MG (50000 UT) CAPS capsule Take 50,000 Units by mouth every 7 (seven) days.     No facility-administered medications prior to visit.    PAST MEDICAL HISTORY: Past Medical History:  Diagnosis Date  . Anxiety and depression   . Arthritis   . Breast cancer ([place]) 02/13/2009  . Cancer of right breast ([place]) 04/16/2012  . Depression   . Depression 2014  . Dysrhythmia    palpitations  . Epilepsy ([place])    last seizure 1990's  . H/O hiatal hernia   . Localization-related (focal) (partial) epilepsy and epileptic syndromes with complex partial seizures, without mention of intractable epilepsy 02/07/2013  . Memory deficit 02/07/2013  . Memory loss   . Mitral  valve prolapse 06/01/10   echo- EF>55% mild tricupsid regurgitation. There is mild Pulmonary Hypertension.. Right  ventricular systolic pressure iselevated at 30-40 mmHg. LV systolic function is normal.  . Osteopenia   . PONV (postoperative nausea and vomiting)   . SOB (shortness of breath) 10/18/11   met-test  . Spinal fusion failure    3    PAST SURGICAL HISTORY: Past Surgical History:  Procedure Laterality Date  . BACK SURGERY     screws- lumbar  . BREAST SURGERY  2010   mastectomy - RIGHT  . COLONOSCOPY WITH PROPOFOL  03/13/2012   Procedure: COLONOSCOPY WITH PROPOFOL;  Surgeon: Garlan Fair, MD;  Location: [facility];  Service: Endoscopy;  Laterality: N/A;  . ESOPHAGOGASTRODUODENOSCOPY  03/13/2012   Procedure: ESOPHAGOGASTRODUODENOSCOPY (EGD);  Surgeon: Garlan Fair, MD;  Location: [facility];  Service: Endoscopy;  Laterality: N/A;  Reflux, Hiatal Hernia  . HAND TENDON SURGERY  2012   tendons released in right hand   . MASTECTOMY, RADICAL  02/13/2009  . [place],  2003, 2005  . TONSILLECTOMY      FAMILY HISTORY: Family History  Problem Relation Age of Onset  . Arthritis Mother        rhuematoid  . Heart disease Mother        Atrial fib  . Heart disease Father   . Heart disease Sister        Atrial fib    SOCIAL HISTORY: Social History   Socioeconomic History  . Marital status: Married    Spouse name: Not on file  . Number of children: 1  . Years of education: college 2  . Highest education level: Not on file  Occupational History  . Occupation: unemployed    Fish farm manager: UNEMPLOYED    Comment: 2003  Tobacco Use  . Smoking status: Current Every Day Smoker    Packs/day: 1.00    Years: 43.00    Pack years: 43.00    Types: Cigarettes  . Smokeless tobacco: Never Used  . Tobacco comment: 1/2 pk per day  Substance and Sexual Activity  . Alcohol use: No    Alcohol/week: 0.0 standard drinks  . Drug use: No  . Sexual activity: Not on file   Other Topics Concern  . Not on file  Social History Narrative   Patient is married with one child.   Patient is right handed.   Patient has 2 yrs of education.   Patient does not drink caffeine.   Social Determinants of Health   Financial Resource Strain: Not on file  Food Insecurity: Not on file  Transportation Needs: Not on file  Physical Activity: Not on file  Stress: Not on file  Social Connections: Not on file  Intimate Partner Violence: Not on file   PHYSICAL EXAM  There were no vitals filed for this visit. There is no height or weight on file to calculate BMI.  Generalized: Well developed, in no acute distress   Neurological examination  Mentation: Alert oriented to time, place, history taking. Follows all commands speech and language fluent Cranial nerve II-XII: Pupils were equal round reactive to light. Extraocular movements were full, visual field were full on confrontational test. Facial sensation and strength were normal. Head turning and shoulder shrug  were normal and symmetric. Motor: The motor testing reveals 5 over 5 strength of all 4 extremities. Good symmetric motor tone is noted throughout.  Sensory: Sensory testing is intact to soft touch on all 4 extremities. No evidence of extinction is noted.  Coordination: Cerebellar testing reveals good finger-nose-finger and heel-to-shin bilaterally.  Gait and station: Gait is normal. Tandem gait is slightly unsteady. Romberg is negative. No drift is seen.  Reflexes: Deep tendon reflexes are symmetric and normal bilaterally.   DIAGNOSTIC DATA (LABS, IMAGING, TESTING) - I reviewed patient records, labs, notes, testing and imaging myself where available.  Lab Results  Component Value Date   WBC 7.7 03/02/2020   HGB 16.5 (H) 03/02/2020   HCT 47.3 (H) 03/02/2020   MCV 90 03/02/2020   PLT 160 03/02/2020      Component Value Date/Time   NA 143 03/02/2020 0917   K 4.5 03/02/2020 0917   CL 106 03/02/2020 0917    CO2 26 03/02/2020 0917   GLUCOSE 95 03/02/2020 0917   GLUCOSE 111 (H) 11/20/2018 1101   BUN 11 03/02/2020 0917   CREATININE 0.68 03/02/2020 0917   CALCIUM 8.9 03/02/2020 0917   PROT 6.3 03/02/2020 0917   ALBUMIN 4.1 03/02/2020 0917   AST 14 03/02/2020 0917  ALT 15 03/02/2020 0917   ALKPHOS 139 (H) 03/02/2020 0917   BILITOT <0.2 03/02/2020 0917   GFRNONAA 95 03/02/2020 0917   GFRAA 110 03/02/2020 0917   Lab Results  Component Value Date   CHOL 170 02/17/2017   HDL 70 02/17/2017   LDLCALC 87 02/17/2017   TRIG 65 02/17/2017   CHOLHDL 2.4 02/17/2017   No results found for: HGBA1C Lab Results  Component Value Date   VITAMINB12 292 11/21/2017   Lab Results  Component Value Date   TSH 0.842 04/06/2018    ASSESSMENT AND PLAN 60 y.o. year old female  has a past medical history of Anxiety and depression, Arthritis, Breast cancer ([place]) (02/13/2009), Cancer of right breast ([place]) (04/16/2012), Depression, Depression (2014), Dysrhythmia, Epilepsy ([place]), H/O hiatal hernia, Localization-related (focal) (partial) epilepsy and epileptic syndromes with complex partial seizures, without mention of intractable epilepsy (02/07/2013), Memory deficit (02/07/2013), Memory loss, Mitral valve prolapse (06/01/10), Osteopenia, PONV (postoperative nausea and vomiting), SOB (shortness of breath) (10/18/11), and Spinal fusion failure. here with:  1.  History of seizures 2.  Headache 3.  Dizziness -In November 2021, Dilantin level was 11.6, Keppra level 29.6, -MRI of the brain in November 2021 showed few white matter spots, could be related to migraine headaches, right mastoid effusion, she saw ENT -Headaches sound migrainous, with history of, but also, potentially contributing to rebound headache with daily Excedrin Migraine -CT head without contrast October 2021 was unremarkable -Hesitant to try medication, due to reported significant allergy to common fillers and medications -Consider, needs a repeat  MRI of the brain? (h/a, dizziness, numbness right lower face), even EEG (questions if seizures causing dizziness) -Cardiac monitor showed SR, 6 beat burst of SVT, PACS, couplets less than 1%, explained her palpations, on metoprolol  -Possible dj vu spell in May when high fever from Covid vaccine -Follow-up in 3 months or sooner if needed  I spent 30 minutes of face-to-face and non-face-to-face time with patient.  This included previsit chart review, lab review, study review, order entry, electronic health record documentation, patient education.  Butler Denmark, AGNP-C, DNP 08/05/2020, 5:41 AM [facility] [address], [place], [place] [address] [phone]

## 2020-09-22 ENCOUNTER — Ambulatory Visit: Payer: Medicare Other | Admitting: Cardiovascular Disease

## 2020-10-20 DIAGNOSIS — F17210 Nicotine dependence, cigarettes, uncomplicated: Secondary | ICD-10-CM | POA: Diagnosis not present

## 2020-10-20 DIAGNOSIS — M8580 Other specified disorders of bone density and structure, unspecified site: Secondary | ICD-10-CM | POA: Diagnosis not present

## 2020-10-20 DIAGNOSIS — G40909 Epilepsy, unspecified, not intractable, without status epilepticus: Secondary | ICD-10-CM | POA: Diagnosis not present

## 2020-10-20 DIAGNOSIS — I10 Essential (primary) hypertension: Secondary | ICD-10-CM | POA: Diagnosis not present

## 2020-10-20 DIAGNOSIS — Z0000 Encounter for general adult medical examination without abnormal findings: Secondary | ICD-10-CM | POA: Diagnosis not present

## 2020-10-20 DIAGNOSIS — Z1389 Encounter for screening for other disorder: Secondary | ICD-10-CM | POA: Diagnosis not present

## 2020-10-20 DIAGNOSIS — J439 Emphysema, unspecified: Secondary | ICD-10-CM | POA: Diagnosis not present

## 2020-10-20 DIAGNOSIS — F322 Major depressive disorder, single episode, severe without psychotic features: Secondary | ICD-10-CM | POA: Diagnosis not present

## 2020-10-20 DIAGNOSIS — J309 Allergic rhinitis, unspecified: Secondary | ICD-10-CM | POA: Diagnosis not present

## 2020-10-20 DIAGNOSIS — K219 Gastro-esophageal reflux disease without esophagitis: Secondary | ICD-10-CM | POA: Diagnosis not present

## 2020-10-20 DIAGNOSIS — I700 Atherosclerosis of aorta: Secondary | ICD-10-CM | POA: Diagnosis not present

## 2020-10-26 ENCOUNTER — Encounter: Payer: Self-pay | Admitting: Neurology

## 2020-10-26 ENCOUNTER — Other Ambulatory Visit: Payer: Self-pay

## 2020-10-26 ENCOUNTER — Ambulatory Visit (INDEPENDENT_AMBULATORY_CARE_PROVIDER_SITE_OTHER): Payer: Medicare Other | Admitting: Neurology

## 2020-10-26 ENCOUNTER — Telehealth: Payer: Self-pay | Admitting: Neurology

## 2020-10-26 VITALS — BP 137/84 | HR 71 | Ht 62.0 in | Wt 133.5 lb

## 2020-10-26 DIAGNOSIS — G8929 Other chronic pain: Secondary | ICD-10-CM

## 2020-10-26 DIAGNOSIS — R42 Dizziness and giddiness: Secondary | ICD-10-CM

## 2020-10-26 DIAGNOSIS — R519 Headache, unspecified: Secondary | ICD-10-CM

## 2020-10-26 DIAGNOSIS — G40909 Epilepsy, unspecified, not intractable, without status epilepticus: Secondary | ICD-10-CM | POA: Diagnosis not present

## 2020-10-26 DIAGNOSIS — E559 Vitamin D deficiency, unspecified: Secondary | ICD-10-CM | POA: Diagnosis not present

## 2020-10-26 DIAGNOSIS — E538 Deficiency of other specified B group vitamins: Secondary | ICD-10-CM

## 2020-10-26 MED ORDER — LEVETIRACETAM 500 MG PO TABS: ORAL_TABLET | ORAL | 3 refills | Status: DC

## 2020-10-26 MED ORDER — DILANTIN 100 MG PO CAPS: ORAL_CAPSULE | ORAL | 3 refills | Status: DC

## 2020-10-26 MED ORDER — DILANTIN 30 MG PO CAPS: ORAL_CAPSULE | ORAL | 1 refills | Status: DC

## 2020-10-26 NOTE — Progress Notes (Signed)
I have read the note, and I agree with the clinical assessment and plan.  Jaleah Lefevre K Katalaya Beel   

## 2020-10-26 NOTE — Telephone Encounter (Signed)
Patient saw Judson Roch today and Judson Roch wants her to see Dr. April Manson in six months. Per Ward Givens, Dr. Jabier Mutton schedule can't be open for next year until he starts. I told the patient and she agreed to call us back in the fall to schedule her follow-up with Dr. April Manson in January 2023.

## 2020-10-26 NOTE — Progress Notes (Signed)
PATIENT: Wendy Lopez DOB: 1961-04-01  REASON FOR VISIT: follow up HISTORY FROM: patient Primary Neurologist: Dr. Jannifer Franklin  Today 10/26/20 Wendy Lopez is a 60 year old female with history of seizures and migraine headaches. Had dizziness last year, MRI of the brain showed few white matter lesions, nonspecific, could be related to migraine history. Requires brand name medications, highly allergic to filler in generic medications, on Dilantin, but generic Keppra. Took brand name Trokendi for headaches, reports facial swelling, breaking out, tremors, stopped it. Had headaches up until March, gradually continues to improve. Last week had migraine, has vomiting with migraines, doesn't want more medications due to high sensitivity. Had Dilantin level checked 10/20/20, Dilantin level 8.4. Claims pre-seizure feeling last week, foggy, sensation/squeezing in neck and chest. 4 grand mal seizures in her life, most have been partial. Wonders if brand name medication Dilantin has been changed to account for recent feeling. Here today alone.   Reviewed blood work from PCP collected October 20, 2020: Dilantin level was 8.4, Keppra level was 23.7, CMP was normal, CBC showed slightly elevated HCT 47.2, platelet level mildly low 136, TSH 1.08.  Update 03/02/2020 SS: Wendy Lopez is a 60 year old female with history of seizures, last seizure was in August 2020, she had awakened and had bitten her tongue, her Dilantin was increased.  She is on Dilantin and Keppra.  She is highly allergic to, filler in generic medications, but is tolerating generic Keppra, has found a specific manufacturer (Lupen) works well for her.  Today, reports return of her migraines, that she was experiencing her 30s, also dizziness (floaty sensation), hard to say exactly when it started, but probably around May.  Headache is bilateral, pounding, photophobia, phonophobia, nausea, occasional vomiting.  Reports daily headache, usually not in the  morning, could be anytime throughout the day.  Unable to identify triggers.  Headache and dizziness are related.  Taking daily Excedrin Migraine.  Usually dizziness is brought on by movement on the screen (TV/phone), pending appointment with ophthalmology.  Has seen PCP, CT head in October was unremarkable.  Since June, noted decreased sensation/numbness to her right lower face, no facial droop or slurred speech.  Has had several falls related to dizziness.  No reported changes in medications.  Has seen cardiology, heart monitor is pending.  Had Covid vaccine 2nd dose May 10th, that night, had fever 103, shaking, had dj vu spell, which can be associated with seizure.  Had a tremor in both hands for 8 weeks, that went away.  Presents today for evaluation unaccompanied.  HISTORY 06/05/2019 SS: Wendy Lopez is a 60 year old female with history of seizures.  When last seen, she had likely seizure episode, when she had awakened and had bitten her tongue.  Her Dilantin level was increased with a 30 mg capsule, she remains on Keppra.  She indicates since increasing her dose of Dilantin, she is actually feeling a lot better, has been having more energy, she is very pleased.  She is taking generic Keppra, has a Licensed conveyancer (Lupen) that works for her.  She says she is highly allergic to a common filler in generic medications.  Her allergy has been so significant, at times has had to use EpiPen.  She has not had recurrent seizure.  She is working with pharmacy, unsure, how much more medication she will be able to receive from this manufacturer.  In the past, she has been on brand-name Keppra, but insurance did not pay.  She presents today for follow-up unaccompanied.  REVIEW OF SYSTEMS: Out of a complete 14 system review of symptoms, the patient complains only of the following symptoms, and all other reviewed systems are negative.  See HPI  ALLERGIES: Allergies  Allergen Reactions   Carbamazepine  Anaphylaxis   Lamictal [Lamotrigine] Anaphylaxis    SOB, Lamotrigine only   Other     STATES HAS SHORTNESS OF BREATH WITH GENERIC DRUGS     ALSO RASH AND ITCHING   Pseudoephedrine Hcl Er Other (See Comments)    SEIZURES.   Valium Other (See Comments)    Hallucinations, spasticity, hyper-relfexive    Amlodipine Rash    Face swell   Oxycodone Nausea And Vomiting and Rash   Prednisone Rash and Other (See Comments)    Prescribing MD told patient he thinks dose (dose pack) was "just too high." 03/09/11   STATES HAS TAKEN SMALLER DOSE AND STILL HAD RASH AND JITTERINESS   Ibuprofen Swelling   Percocet [Oxycodone-Acetaminophen] Nausea And Vomiting   Betadine [Povidone Iodine] Rash   Latex Dermatitis and Rash   Shellfish Allergy Swelling    HOME MEDICATIONS: Outpatient Medications Prior to Visit  Medication Sig Dispense Refill   aspirin-acetaminophen-caffeine (EXCEDRIN MIGRAINE) 250-250-65 MG tablet Take 1 tablet by mouth as needed. Pain      cyanocobalamin 100 MCG tablet Take 100 mcg by mouth daily.     metoprolol succinate (TOPROL-XL) 100 MG 24 hr tablet TAKE 1 TABLET BY MOUTH TWICE DAILY. TAKE WITH OR IMMEDIATELY FOLLOWING A MEAL. (Patient taking differently: Pt takes 1 tab daily) 180 tablet 2   metoprolol succinate (TOPROL-XL) 100 MG 24 hr tablet Take 100 mg by mouth daily. Take with or immediately following a meal.     Misc. Devices MISC Please provide patient with right breast prosthesis and 6 mastectomy bras sue to a history of right breast cancer and mastectomy. (ICD10- C50.111) 6 each 0   pantoprazole (PROTONIX) 20 MG tablet Take 20 mg by mouth daily.     Vitamin D, Ergocalciferol, (DRISDOL) 1.25 MG (50000 UT) CAPS capsule Take 50,000 Units by mouth every 7 (seven) days.     DILANTIN 100 MG ER capsule TAKE 1 CAPSULE(100 MG) BY MOUTH THREE TIMES DAILY 270 capsule 3   DILANTIN 30 MG ER capsule TAKE 1 CAPSULE(30 MG) BY MOUTH DAILY 90 capsule 1   levETIRAcetam (KEPPRA) 500 MG tablet  TAKE 1 TABLET BY MOUTH EVERY MORNING AND 2 TABLETS IN THE EVENING 270 tablet 3   TROKENDI XR 25 MG CP24 Take 1 tablet daily for 1 week, then take 2 tablets daily 60 capsule 5   No facility-administered medications prior to visit.    PAST MEDICAL HISTORY: Past Medical History:  Diagnosis Date   Anxiety and depression    Arthritis    Breast cancer ([place]) 02/13/2009   Cancer of right breast ([place]) 04/16/2012   Depression    Depression 2014   Dysrhythmia    palpitations   Epilepsy ([place])    last seizure 1990's   H/O hiatal hernia    Localization-related (focal) (partial) epilepsy and epileptic syndromes with complex partial seizures, without mention of intractable epilepsy 02/07/2013   Memory deficit 02/07/2013   Memory loss    Mitral valve prolapse 06/01/10   echo- EF>55% mild tricupsid regurgitation. There is mild Pulmonary Hypertension.. Right  ventricular systolic pressure iselevated at 30-40 mmHg. LV systolic function is normal.   Osteopenia    PONV (postoperative nausea and vomiting)    SOB (shortness of breath) 10/18/11   met-test  Spinal fusion failure    3    PAST SURGICAL HISTORY: Past Surgical History:  Procedure Laterality Date   BACK SURGERY     screws- lumbar   BREAST SURGERY  2010   mastectomy - RIGHT   COLONOSCOPY WITH PROPOFOL  03/13/2012   Procedure: COLONOSCOPY WITH PROPOFOL;  Surgeon: Garlan Fair, MD;  Location: [facility];  Service: Endoscopy;  Laterality: N/A;   ESOPHAGOGASTRODUODENOSCOPY  03/13/2012   Procedure: ESOPHAGOGASTRODUODENOSCOPY (EGD);  Surgeon: Garlan Fair, MD;  Location: [facility];  Service: Endoscopy;  Laterality: N/A;  Reflux, Hiatal Hernia   HAND TENDON SURGERY  2012   tendons released in right hand    MASTECTOMY, RADICAL  02/13/2009   SPINE SURGERY  1999, 2003, 2005   TONSILLECTOMY      FAMILY HISTORY: Family History  Problem Relation Age of Onset   Arthritis Mother        rhuematoid   Heart disease Mother         Atrial fib   Heart disease Father    Heart disease Sister        Atrial fib    SOCIAL HISTORY: Social History   Socioeconomic History   Marital status: Married    Spouse name: Not on file   Number of children: 1   Years of education: college 2   Highest education level: Not on file  Occupational History   Occupation: unemployed    Employer: UNEMPLOYED    Comment: 2003  Tobacco Use   Smoking status: Every Day    Packs/day: 1.00    Years: 43.00    Pack years: 43.00    Types: Cigarettes   Smokeless tobacco: Never   Tobacco comments:    1/2 pk per day  Substance and Sexual Activity   Alcohol use: No    Alcohol/week: 0.0 standard drinks   Drug use: No   Sexual activity: Not on file  Other Topics Concern   Not on file  Social History Narrative   Patient lives at home with husband   Patient is right handed.   Patient does not drink caffeine.   Social Determinants of Health   Financial Resource Strain: Not on file  Food Insecurity: Not on file  Transportation Needs: Not on file  Physical Activity: Not on file  Stress: Not on file  Social Connections: Not on file  Intimate Partner Violence: Not on file   PHYSICAL EXAM  Vitals:   10/26/20 0932  BP: 137/84  Pulse: 71  Weight: 133 lb 8 oz (60.6 kg)  Height: 5' 2"  (1.575 m)    Body mass index is 24.42 kg/m.  Generalized: Well developed, in no acute distress   Neurological examination  Mentation: Alert oriented to time, place, history taking. Follows all commands speech and language fluent Cranial nerve II-XII: Pupils were equal round reactive to light. Extraocular movements were full, visual field were full on confrontational test. Facial sensation and strength were normal. Head turning and shoulder shrug  were normal and symmetric. Motor: The motor testing reveals 5 over 5 strength of all 4 extremities. Good symmetric motor tone is noted throughout.  Sensory: Sensory testing is intact to soft touch on all 4  extremities. No evidence of extinction is noted.  Coordination: Cerebellar testing reveals good finger-nose-finger and heel-to-shin bilaterally.  Gait and station: Gait is normal. Tandem gait is slightly unsteady.  Reflexes: Deep tendon reflexes are symmetric and normal bilaterally.   DIAGNOSTIC DATA (LABS, IMAGING, TESTING) - I  reviewed patient records, labs, notes, testing and imaging myself where available.  Lab Results  Component Value Date   WBC 7.7 03/02/2020   HGB 16.5 (H) 03/02/2020   HCT 47.3 (H) 03/02/2020   MCV 90 03/02/2020   PLT 160 03/02/2020      Component Value Date/Time   NA 143 03/02/2020 0917   K 4.5 03/02/2020 0917   CL 106 03/02/2020 0917   CO2 26 03/02/2020 0917   GLUCOSE 95 03/02/2020 0917   GLUCOSE 111 (H) 11/20/2018 1101   BUN 11 03/02/2020 0917   CREATININE 0.68 03/02/2020 0917   CALCIUM 8.9 03/02/2020 0917   PROT 6.3 03/02/2020 0917   ALBUMIN 4.1 03/02/2020 0917   AST 14 03/02/2020 0917   ALT 15 03/02/2020 0917   ALKPHOS 139 (H) 03/02/2020 0917   BILITOT <0.2 03/02/2020 0917   GFRNONAA 95 03/02/2020 0917   GFRAA 110 03/02/2020 0917   Lab Results  Component Value Date   CHOL 170 02/17/2017   HDL 70 02/17/2017   LDLCALC 87 02/17/2017   TRIG 65 02/17/2017   CHOLHDL 2.4 02/17/2017   No results found for: HGBA1C Lab Results  Component Value Date   VITAMINB12 292 11/21/2017   Lab Results  Component Value Date   TSH 0.842 04/06/2018    ASSESSMENT AND PLAN 60 y.o. year old female  has a past medical history of Anxiety and depression, Arthritis, Breast cancer ([place]) (02/13/2009), Cancer of right breast ([place]) (04/16/2012), Depression, Depression (2014), Dysrhythmia, Epilepsy ([place]), H/O hiatal hernia, Localization-related (focal) (partial) epilepsy and epileptic syndromes with complex partial seizures, without mention of intractable epilepsy (02/07/2013), Memory deficit (02/07/2013), Memory loss, Mitral valve prolapse (06/01/10), Osteopenia, PONV  (postoperative nausea and vomiting), SOB (shortness of breath) (10/18/11), and Spinal fusion failure. here with:  1.  History of seizures 2.  Headache  -Come back this week to check a Dilantin trough level, PCP checked 10/20/20 at 4:30 PM was 8.4, wonder if low level accounts for pre-seizure feeling? Or change in brand name medication formulation? Also check Vit D, B 12 level at her request, historically, has been cause of pre-seizure, discussed safest to not drive until we determine what is going on with Dilantin -For now, we will continue current dose of brand-name Dilantin, generic Keppra for seizure prevention -Headaches are improving -MRI of the brain in November 2021 showed few white matter areas, could be due to minimal chronic microvascular ischemic change or as result of migraine headaches -Follow-up in 6 months or sooner if needed, will see Dr. April Manson  I spent 33 minutes of face-to-face and non-face-to-face time with patient.  This included previsit chart review, lab review, study review, order entry, discussing medications, lab work, management and follow-up.  Butler Denmark, AGNP-C, DNP 10/26/2020, 10:34 AM [facility] [address] [place], [place] [address] [phone]

## 2020-10-26 NOTE — Patient Instructions (Signed)
Come back 1st thing in the morning for Dilantin blood level, bring medication to take after the blood draw Will continue current doses for now, make changes if needed See you back in 6 months

## 2020-11-24 DIAGNOSIS — D696 Thrombocytopenia, unspecified: Secondary | ICD-10-CM | POA: Diagnosis not present

## 2020-12-01 NOTE — Progress Notes (Shared)
[facility] [address], [place] [address]   Patient Care Team: Wenda Low, MD as PCP - General (Internal Medicine) Troy Sine, MD as PCP - Cardiology (Cardiology) Ashok Pall, MD as Consulting Physician (Neurosurgery)  SUMMARY OF ONCOLOGIC HISTORY: Oncology History  Cancer of right breast [facility])  01/13/2009 Initial Diagnosis   Needle core biopsy of 12 o'clock mass in right breast and axillary lymph node demonstrating invasive mammary carcinoma with negative lymph node.  ER 99%, PR 54%, Ki-67 15%, and Her2  negative.   02/13/2009 Definitive Surgery   Right simple mastectomy by Dr. Dalbert Batman demonstrating a 1.7 cm invasive ductal carcinoma, grade 1, no LVI, clear margins and 0/3 sentinel lymph nodes.    Oncotype testing   Intermediate-risk; was offered chemotherapy     Adjuvant Chemotherapy   Taxotere/Cytoxan x 3 cycles completed (unable to complete final cycle due to intolerance; patient weight was reportedly not dose-adjusted).     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  No subsequent anti-estrogen therapy was given.     Procedure   Per patient, she had genetic testing in the past; results were negative per her report.      CHIEF COMPLIANT:  Follow-up for right breast cancer   INTERVAL HISTORY: Wendy Lopez is a 60 y.o. female here today for follow up of her right breast cancer. Her last visit was on 12/12/18 by Francene Finders, NP-C. ***   REVIEW OF SYSTEMS:   Review of Systems  All other systems reviewed and are negative.  I have reviewed the past medical history, past surgical history, social history and family history with the patient and they are unchanged from previous note.   ALLERGIES:   is allergic to carbamazepine, lamictal [lamotrigine], other, pseudoephedrine hcl er,  valium, amlodipine, oxycodone, prednisone, ibuprofen, percocet [oxycodone-acetaminophen], betadine [povidone iodine], latex, and shellfish allergy.   MEDICATIONS:  Current Outpatient Medications  Medication Sig Dispense Refill   aspirin-acetaminophen-caffeine (EXCEDRIN MIGRAINE) 250-250-65 MG tablet Take 1 tablet by mouth as needed. Pain      cyanocobalamin 100 MCG tablet Take 100 mcg by mouth daily.     DILANTIN 100 MG ER capsule TAKE 1 CAPSULE(100 MG) BY MOUTH THREE TIMES DAILY 270 capsule 3   DILANTIN 30 MG ER capsule TAKE 1 CAPSULE(30 MG) BY MOUTH DAILY 90 capsule 1   levETIRAcetam (KEPPRA) 500 MG tablet TAKE 1 TABLET BY MOUTH EVERY MORNING AND 2 TABLETS IN THE EVENING 270 tablet 3   metoprolol succinate (TOPROL-XL) 100 MG 24 hr tablet TAKE 1 TABLET BY MOUTH TWICE DAILY. TAKE WITH OR IMMEDIATELY FOLLOWING A MEAL. (Patient taking differently: Pt takes 1 tab daily) 180 tablet 2   metoprolol succinate (TOPROL-XL) 100 MG 24 hr tablet Take 100 mg by mouth daily. Take with or immediately following a meal.     Misc. Devices MISC Please provide patient with right breast prosthesis and 6 mastectomy bras sue to a history of right breast cancer and mastectomy. (ICD10- C50.111) 6 each 0   pantoprazole (PROTONIX) 20 MG tablet Take 20 mg by mouth daily.     Vitamin D, Ergocalciferol, (DRISDOL) 1.25 MG (50000 UT) CAPS capsule Take 50,000 Units by mouth every 7 (seven) days.     No current facility-administered medications for this visit.     PHYSICAL EXAMINATION: Performance status (ECOG): 1 -  Symptomatic but completely ambulatory  There were no vitals filed for this visit. Wt Readings from Last 3 Encounters:  10/26/20 133 lb 8 oz (60.6 kg)  03/02/20 131 lb 9.6 oz (59.7 kg)  02/18/20 124 lb 3.2 oz (56.3 kg)   Physical Exam Vitals reviewed.  Constitutional:      Appearance: Normal appearance.  Cardiovascular:     Rate and Rhythm: Normal rate and regular rhythm.     Pulses: Normal pulses.      Heart sounds: Normal heart sounds.  Pulmonary:     Effort: Pulmonary effort is normal.     Breath sounds: Normal breath sounds.  Neurological:     General: No focal deficit present.     Mental Status: She is alert and oriented to person, place, and time.  Psychiatric:        Mood and Affect: Mood normal.        Behavior: Behavior normal.    Breast Exam Chaperone: Thana Ates     LABORATORY DATA:  I have reviewed the data as listed CMP Latest Ref Rng & Units 03/02/2020 06/05/2019 11/26/2018  Glucose 65 - 99 mg/dL 95 89 84  BUN 8 - 27 mg/dL 11 12 8   Creatinine 0.57 - 1.00 mg/dL 0.68 1.00 0.78  Sodium 134 - 144 mmol/L 143 144 143  Potassium 3.5 - 5.2 mmol/L 4.5 4.5 5.2  Chloride 96 - 106 mmol/L 106 106 104  CO2 20 - 29 mmol/L 26 25 26   Calcium 8.7 - 10.3 mg/dL 8.9 8.9 9.2  Total Protein 6.0 - 8.5 g/dL 6.3 6.6 6.5  Total Bilirubin 0.0 - 1.2 mg/dL <0.2 0.2 0.3  Alkaline Phos 44 - 121 IU/L 139(H) 138(H) 120(H)  AST 0 - 40 IU/L 14 16 15   ALT 0 - 32 IU/L 15 19 18    No results found for: MBP112 Lab Results  Component Value Date   WBC 7.7 03/02/2020   HGB 16.5 (H) 03/02/2020   HCT 47.3 (H) 03/02/2020   MCV 90 03/02/2020   PLT 160 03/02/2020   NEUTROABS 3.1 03/02/2020    ASSESSMENT:  1.  Stage Ia right breast cancer:  2.  Elevated hemoglobin:  3.  Smoking history:  PLAN:  1.  Stage Ia right breast cancer:  2.  Elevated hemoglobin:  3.  Smoking history:  Breast Cancer therapy associated bone loss: I have recommended calcium, Vitamin D and weight bearing exercises.  Orders placed this encounter:  No orders of the defined types were placed in this encounter.   The patient has a good understanding of the overall plan. She agrees with it. She will call with any problems that may develop before the next visit here.  Derek Jack, MD [place] [phone]   I, Thana Ates, am acting as a scribe for Dr. Derek Jack.  {Add Buyer, retail Statement}

## 2020-12-02 ENCOUNTER — Other Ambulatory Visit: Payer: Self-pay

## 2020-12-02 ENCOUNTER — Inpatient Hospital Stay (HOSPITAL_COMMUNITY): Payer: Medicare Other | Admitting: Hematology

## 2020-12-02 ENCOUNTER — Inpatient Hospital Stay (HOSPITAL_COMMUNITY): Payer: Medicare Other | Attending: Hematology

## 2020-12-02 VITALS — BP 148/78 | HR 67 | Temp 97.5°F | Resp 18 | Wt 132.6 lb

## 2020-12-02 DIAGNOSIS — D649 Anemia, unspecified: Secondary | ICD-10-CM | POA: Insufficient documentation

## 2020-12-02 DIAGNOSIS — Z1231 Encounter for screening mammogram for malignant neoplasm of breast: Secondary | ICD-10-CM

## 2020-12-02 DIAGNOSIS — C50111 Malignant neoplasm of central portion of right female breast: Secondary | ICD-10-CM | POA: Insufficient documentation

## 2020-12-02 DIAGNOSIS — D696 Thrombocytopenia, unspecified: Secondary | ICD-10-CM

## 2020-12-02 DIAGNOSIS — Z170 Estrogen receptor positive status [ER+]: Secondary | ICD-10-CM

## 2020-12-02 DIAGNOSIS — Z87891 Personal history of nicotine dependence: Secondary | ICD-10-CM

## 2020-12-02 DIAGNOSIS — Z122 Encounter for screening for malignant neoplasm of respiratory organs: Secondary | ICD-10-CM

## 2020-12-02 DIAGNOSIS — E559 Vitamin D deficiency, unspecified: Secondary | ICD-10-CM

## 2020-12-02 LAB — CBC WITH DIFFERENTIAL/PLATELET
Abs Immature Granulocytes: 0.02 10*3/uL (ref 0.00–0.07)
Basophils Absolute: 0.1 10*3/uL (ref 0.0–0.1)
Basophils Relative: 1 %
Eosinophils Absolute: 0.1 10*3/uL (ref 0.0–0.5)
Eosinophils Relative: 2 %
HCT: 49.4 % — ABNORMAL HIGH (ref 36.0–46.0)
Hemoglobin: 17.2 g/dL — ABNORMAL HIGH (ref 12.0–15.0)
Immature Granulocytes: 0 %
Lymphocytes Relative: 44 %
Lymphs Abs: 3.4 10*3/uL (ref 0.7–4.0)
MCH: 31.9 pg (ref 26.0–34.0)
MCHC: 34.8 g/dL (ref 30.0–36.0)
MCV: 91.7 fL (ref 80.0–100.0)
Monocytes Absolute: 0.6 10*3/uL (ref 0.1–1.0)
Monocytes Relative: 8 %
Neutro Abs: 3.5 10*3/uL (ref 1.7–7.7)
Neutrophils Relative %: 45 %
Platelets: 166 10*3/uL (ref 150–400)
RBC: 5.39 MIL/uL — ABNORMAL HIGH (ref 3.87–5.11)
RDW: 13.1 % (ref 11.5–15.5)
WBC: 7.7 10*3/uL (ref 4.0–10.5)
nRBC: 0 % (ref 0.0–0.2)

## 2020-12-02 LAB — COMPREHENSIVE METABOLIC PANEL
ALT: 24 U/L (ref 0–44)
AST: 20 U/L (ref 15–41)
Albumin: 4.1 g/dL (ref 3.5–5.0)
Alkaline Phosphatase: 113 U/L (ref 38–126)
Anion gap: 6 (ref 5–15)
BUN: 10 mg/dL (ref 6–20)
CO2: 28 mmol/L (ref 22–32)
Calcium: 8.5 mg/dL — ABNORMAL LOW (ref 8.9–10.3)
Chloride: 103 mmol/L (ref 98–111)
Creatinine, Ser: 0.72 mg/dL (ref 0.44–1.00)
GFR, Estimated: >60 mL/min (ref >60)
Glucose, Bld: 91 mg/dL (ref 70–99)
Potassium: 4.2 mmol/L (ref 3.5–5.1)
Sodium: 137 mmol/L (ref 135–145)
Total Bilirubin: 0.5 mg/dL (ref 0.3–1.2)
Total Protein: 6.9 g/dL (ref 6.5–8.1)

## 2020-12-02 LAB — FOLATE: Folate: 8.6 ng/mL (ref >5.9)

## 2020-12-02 LAB — VITAMIN D 25 HYDROXY (VIT D DEFICIENCY, FRACTURES): Vit D, 25-Hydroxy: 59.05 ng/mL (ref 30–100)

## 2020-12-02 LAB — VITAMIN B12: Vitamin B-12: 183 pg/mL (ref 180–914)

## 2020-12-03 LAB — PATHOLOGIST SMEAR REVIEW

## 2020-12-04 LAB — PROTEIN ELECTROPHORESIS, SERUM
A/G Ratio: 1.6 (ref 0.7–1.7)
Albumin ELP: 3.9 g/dL (ref 2.9–4.4)
Alpha-1-Globulin: 0.2 g/dL (ref 0.0–0.4)
Alpha-2-Globulin: 0.7 g/dL (ref 0.4–1.0)
Beta Globulin: 0.8 g/dL (ref 0.7–1.3)
Gamma Globulin: 0.9 g/dL (ref 0.4–1.8)
Globulin, Total: 2.5 g/dL (ref 2.2–3.9)
Total Protein ELP: 6.4 g/dL (ref 6.0–8.5)

## 2020-12-04 LAB — COPPER, SERUM: Copper: 153 ug/dL (ref 80–158)

## 2020-12-05 LAB — METHYLMALONIC ACID, SERUM: Methylmalonic Acid, Quantitative: 255 nmol/L (ref 0–378)

## 2020-12-11 LAB — ANTINUCLEAR ANTIBODIES, IFA: ANA Ab, IFA: NEGATIVE

## 2020-12-16 ENCOUNTER — Other Ambulatory Visit: Payer: Self-pay

## 2020-12-16 ENCOUNTER — Ambulatory Visit (HOSPITAL_COMMUNITY)
Admission: RE | Admit: 2020-12-16 | Discharge: 2020-12-16 | Disposition: A | Discharge status: Home / Self Care | Payer: Medicare Other | Source: Ambulatory Visit | Attending: Hematology | Admitting: Hematology

## 2020-12-16 DIAGNOSIS — Z87891 Personal history of nicotine dependence: Secondary | ICD-10-CM | POA: Insufficient documentation

## 2020-12-16 DIAGNOSIS — C50111 Malignant neoplasm of central portion of right female breast: Secondary | ICD-10-CM | POA: Diagnosis not present

## 2020-12-16 DIAGNOSIS — Z170 Estrogen receptor positive status [ER+]: Secondary | ICD-10-CM | POA: Insufficient documentation

## 2020-12-16 DIAGNOSIS — Z1231 Encounter for screening mammogram for malignant neoplasm of breast: Secondary | ICD-10-CM | POA: Insufficient documentation

## 2020-12-23 ENCOUNTER — Ambulatory Visit (HOSPITAL_COMMUNITY): Payer: Medicare Other | Admitting: Hematology

## 2021-01-04 ENCOUNTER — Encounter (HOSPITAL_COMMUNITY): Payer: Self-pay

## 2021-01-04 ENCOUNTER — Ambulatory Visit (HOSPITAL_COMMUNITY): Payer: Medicare Other

## 2021-01-06 ENCOUNTER — Ambulatory Visit (HOSPITAL_COMMUNITY): Payer: Medicare Other | Admitting: Hematology

## 2021-01-27 ENCOUNTER — Ambulatory Visit (INDEPENDENT_AMBULATORY_CARE_PROVIDER_SITE_OTHER): Payer: Medicare Other | Admitting: Cardiovascular Disease

## 2021-01-27 ENCOUNTER — Other Ambulatory Visit: Payer: Self-pay

## 2021-01-27 ENCOUNTER — Encounter: Payer: Self-pay | Admitting: Cardiovascular Disease

## 2021-01-27 VITALS — BP 144/87 | HR 61 | Ht 62.0 in | Wt 134.6 lb

## 2021-01-27 DIAGNOSIS — I10 Essential (primary) hypertension: Secondary | ICD-10-CM | POA: Diagnosis not present

## 2021-01-27 DIAGNOSIS — G40909 Epilepsy, unspecified, not intractable, without status epilepticus: Secondary | ICD-10-CM

## 2021-01-27 DIAGNOSIS — Z720 Tobacco use: Secondary | ICD-10-CM | POA: Diagnosis not present

## 2021-01-27 DIAGNOSIS — R002 Palpitations: Secondary | ICD-10-CM

## 2021-01-27 DIAGNOSIS — Z853 Personal history of malignant neoplasm of breast: Secondary | ICD-10-CM

## 2021-01-27 MED ORDER — METOPROLOL SUCCINATE ER 100 MG PO TB24: 100.0000 mg | ORAL_TABLET | Freq: Every day | ORAL | 3 refills | Status: DC

## 2021-01-27 NOTE — Progress Notes (Signed)
Patient ID: Wendy Lopez, female   DOB: [birth year], 60 y.o.   MRN: [id number]     HPI: Wendy Lopez is a 60 y.o. female who presents for a 39 month cardiology follow-up evaluation.   Wendy Lopez has a long-standing history of tobacco abuse and started smoking at age 43.  In 2009 cardiac catheterization revealed normal coronary arteries. In February 2012 a nuclear perfusion study was done after she experienced episodes of chest pain and shortness of breath and this revealed normal perfusion. An echo Doppler study demonstrated mild pulmonary hypertension with estimated RV systolic pressure of 36 mm. Because of issues of recurrent shortness of breath she underwent a cardiopulmonary met test and was found to have a blunted chronotropic response to exercise which is making it difficult for her to improve her endurance and exercise capacity. She has a history of tachypalpitations. She has a history of invasive ductal carcinoma and is status post right mastectomy with sentinel node biopsy and is status post chemotherapy. She has a history of vitamin D insufficiency mild blood pressure elevation. There is also a history of depression.  On 10/16/2012 , a 2-D echo Doppler study  revealed an ejection fraction in the range of 60-65%. She did not have wall motion abnormalities and had normal diastolic function. There was evidence for right ventricular hypertrophy with normal RV function. She again had mild pulmonary hypertension with an estimated pressure 39 mm. There is moderate tricuspid regurgitation. Wendy Lopez continues to smoke cigarettes. She does note some indigestion. He also has noticed some left leg weakness and has issues with L4-L5 disc disease. She previously was on a higher dose of present Brintellex but due to tremors she has reduced his dose to 5 mg daily.  A subsequent nuclear perfusion study was done on 11/20/2012 after her ECG revealed slight additional T-wave abnormalities and was  essentially normal without wall motion abnormality, scar or ischemia.  She has a history of cervical disc disease and had noted some upper back discomfort in the past.  In December 2015, her EKG had shown  more pronounced downsloping ST segment depression in the inferior to inferolateral leads.  I recommended a subsequent nuclear perfusion study.  This was done on 04/17/2014 , which was not significant change from her previous study.  She again had basal inferolateral ST-T changes with T-wave inversion in V3 through V4 which did not significant change with stress.  She had normal perfusion with post-rest ejection fraction at 65%.  When seen in 2016 she was smoking one half pack of cigarettes per day.  She had had medication issues and was on Keppra in place of Lamictal for seizures due to cost. Recently, she  had some issue with metoprolol succinate.  She had self reduced this to 100 mg per day.  When I saw her several months ago.  She was wanting to switch to a shorter acting version, however, noted more palpitations on the tartrate preparation. As result, she put herself back on long-acting Toprol.  I saw her in October 2018 and over the 2 years previously she was  without anginal symptoms.  She quit smoking in December 2017, but unfortunately resumed during the period of increased stress 4 months later.  Unfortunately she was still smoking cigarettes.  She was no longer having caffeine or chocolate.  She denies any exertional chest pain.  Her palpitations have been fairly well controlled on Toprol 200 mg daily.  She denied seizure activity and is on Keppra  and Dilantin.    She has undergone several additional evaluations with Wendy Drown, NP and Wendy Lopez, Hart in November and December 2019.  She had experienced intermittent midsternal chest pain/tightness without radiation which she described to Wendy Drown, NP on 02/16/2018.  She subsequently underwent a Lexiscan Myoview which was normal.  EF was  greater than 65%.  Because of potential lower extremity claudication issues on March 06, 2018 lower semi-Doppler imaging revealed normal ABIs bilaterally.  An echo Doppler study November 2019 showed an EF of 55 to 60% without wall motion abnormalities.  She had a mildly thickened mitral valve without frank prolapse.  She was seen by Wendy Deforest, PA in follow-up.  She also underwent nerve conduction studies by Wendy Lopez and she does not have peripheral neuropathy.  She continues to have issues with her neck and since Wendy Lopez has retired she is now seeing Wendy Lopez.  Unfortunately she still smoking 1/2 to 1 pack/day.  She admits to occasional palpitations at night with heart rates going up into the 110 range.  Apparently she had had a home study for evaluation of potential sleep apnea by Wendy Lopez.  I last saw her in February 08, 2019.  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  She wore an event monitor from July 30 through November 28, 2018.  She was in sinus rhythm with an average rate at 70 bpm.  The fastest heart rate was sinus tachycardia at 112 bpm.  She did not have any episodes of atrial fibrillation, SVT, or prolonged bradycardia or ectopy.  She continues to smoke cigarettes.  She does undergo yearly low-dose CT imaging for lung cancer screening.   Since I saw her, she has been evaluated by Wendy Lopez on several occasions, most recently in November 2021.  She had worn a cardiac monitor from October 12 through February 11, 2020.  The predominant rhythm was sinus with an average rate of 81 bpm.  The slowest heart rate was sinus bradycardia 54 bpm.  There was a 6 beat burst of SVT at 176 bpm and there were a total of 21 short-lived episodes of SVT with the longest lasting 15 beats at an average rate at 103 bpm.  There were rare PACs, atrial couplets and triplets less than 1% and there were rare  isolated PVCs less than 1%.  She was evaluated by [facility] neurology, Wendy Denmark, NP in July 2022 for her seizures.  She had Dr. Jannifer Franklin for her neurologist who is recently retired.  She apparently had a short-lived recent seizure 1 week ago and has required brand-name medications and is on Dilantin extended release 100 mg 3 times a day, Keppra 500 mg in the morning and 1000 mg at night and has been on metoprolol succinate 100 mg daily.  She is on sertraline 10 mg.  She denies chest pain.  She denies recent palpitations.  Unfortunately she still smokes.  She presents for evaluation.   Past Medical History:  Diagnosis Date   Anxiety and depression    Arthritis    Breast cancer ([place]) 02/13/2009   Cancer of right breast ([place]) 04/16/2012   Depression    Depression 2014   Dysrhythmia    palpitations   Epilepsy ([place])    last seizure 1990's   H/O hiatal hernia    Localization-related (focal) (partial) epilepsy and epileptic syndromes with complex partial seizures,  without mention of intractable epilepsy 02/07/2013   Memory deficit 02/07/2013   Memory loss    Mitral valve prolapse 06/01/10   echo- EF>55% mild tricupsid regurgitation. There is mild Pulmonary Hypertension.. Right  ventricular systolic pressure iselevated at 30-40 mmHg. LV systolic function is normal.   Osteopenia    PONV (postoperative nausea and vomiting)    SOB (shortness of breath) 10/18/11   met-test   Spinal fusion failure    3    Past Surgical History:  Procedure Laterality Date   BACK SURGERY     screws- lumbar   BREAST SURGERY  2010   mastectomy - RIGHT   COLONOSCOPY WITH PROPOFOL  03/13/2012   Procedure: COLONOSCOPY WITH PROPOFOL;  Surgeon: Garlan Fair, MD;  Location: [facility] ENDOSCOPY;  Service: Endoscopy;  Laterality: N/A;   ESOPHAGOGASTRODUODENOSCOPY  03/13/2012   Procedure: ESOPHAGOGASTRODUODENOSCOPY (EGD);  Surgeon: Garlan Fair, MD;  Location: [facility];  Service: Endoscopy;  Laterality: N/A;   Reflux, Hiatal Hernia   HAND TENDON SURGERY  2012   tendons released in right hand    MASTECTOMY, RADICAL  02/13/2009   SPINE SURGERY  1999, 2003, 2005   TONSILLECTOMY      Allergies  Allergen Reactions   Carbamazepine Anaphylaxis   Lamictal [Lamotrigine] Anaphylaxis    SOB, Lamotrigine only   Other     STATES HAS SHORTNESS OF BREATH WITH GENERIC DRUGS     ALSO RASH AND ITCHING   Pseudoephedrine Hcl Er Other (See Comments)    SEIZURES.   Valium Other (See Comments)    Hallucinations, spasticity, hyper-relfexive    Amlodipine Rash    Face swell   Oxycodone Nausea And Vomiting and Rash   Prednisone Rash and Other (See Comments)    Prescribing MD told patient he thinks dose (dose pack) was "just too high." 03/09/11   STATES HAS TAKEN SMALLER DOSE AND STILL HAD RASH AND JITTERINESS   Ibuprofen Swelling   Percocet [Oxycodone-Acetaminophen] Nausea And Vomiting   Trokendi [place] Er] Other (See Comments)    Made headache worse, dizziness, difficulty breathing, rash   Betadine [Povidone Iodine] Rash   Latex Dermatitis and Rash   Shellfish Allergy Swelling    Current Outpatient Medications  Medication Sig Dispense Refill   aspirin-acetaminophen-caffeine (EXCEDRIN MIGRAINE) 250-250-65 MG tablet Take 1 tablet by mouth as needed. Pain     cetirizine (ZYRTEC) 10 MG tablet Take by mouth.     cyanocobalamin 100 MCG tablet Take 100 mcg by mouth daily.     DILANTIN 100 MG ER capsule TAKE 1 CAPSULE(100 MG) BY MOUTH THREE TIMES DAILY 270 capsule 3   DILANTIN 30 MG ER capsule TAKE 1 CAPSULE(30 MG) BY MOUTH DAILY 90 capsule 1   levETIRAcetam (KEPPRA) 500 MG tablet TAKE 1 TABLET BY MOUTH EVERY MORNING AND 2 TABLETS IN THE EVENING 270 tablet 3   meclizine (ANTIVERT) 25 MG tablet Take by mouth.     Misc. Devices MISC Please provide patient with right breast prosthesis and 6 mastectomy bras sue to a history of right breast cancer and mastectomy. (ICD10- C50.111) 6 each 0   sodium chloride  (OCEAN) 0.65 % nasal spray Place into the nose.     Vitamin D, Ergocalciferol, (DRISDOL) 1.25 MG (50000 UT) CAPS capsule Take 50,000 Units by mouth every 7 (seven) days.     metoprolol succinate (TOPROL-XL) 100 MG 24 hr tablet Take 1 tablet (100 mg total) by mouth daily. Take with or immediately following a meal. 90 tablet  3   No current facility-administered medications for this visit.    Social History   Socioeconomic History   Marital status: Married    Spouse name: Not on file   Number of children: 1   Years of education: college 2   Highest education level: Not on file  Occupational History   Occupation: unemployed    Employer: UNEMPLOYED    Comment: 2003  Tobacco Use   Smoking status: Every Day    Packs/day: 1.00    Years: 43.00    Pack years: 43.00    Types: Cigarettes   Smokeless tobacco: Never   Tobacco comments:    1/2 pk per day  Substance and Sexual Activity   Alcohol use: No    Alcohol/week: 0.0 standard drinks   Drug use: No   Sexual activity: Not on file  Other Topics Concern   Not on file  Social History Narrative   Patient lives at home with husband   Patient is right handed.   Patient does not drink caffeine.   Social Determinants of Health   Financial Resource Strain: Not on file  Food Insecurity: Not on file  Transportation Needs: Not on file  Physical Activity: Not on file  Stress: Not on file  Social Connections: Not on file  Intimate Partner Violence: Not on file    Social she is married and has one child. She has been smoking since age 53.  ROS General: Negative; No fevers, chills, or night sweats;  HEENT: Negative; No changes in vision or hearing, sinus congestion, difficulty swallowing Pulmonary: Negative; No cough, wheezing, shortness of breath, hemoptysis Cardiovascular: See HPI GI: Negative; No nausea, vomiting, diarrhea, or abdominal pain GU: Negative; No dysuria, hematuria, or difficulty voiding Musculoskeletal: Neck and leg  discomfort Hematologic/Oncology: Remote history of stage I breast CA of the right breast with recent evaluation felt to be stable. Endocrine: Negative; no heat/cold intolerance; no diabetes Neuro: history of seizure disorder for which she had been on chronic Lamictal and Dilantin and was followed by Dr. Jannifer Franklin; previously she had been followed by Dr. Erling Cruz.  She was switched to [place] due to cost since she could not take generic Lamictal.  Intermittent paresthesias in her extremities   Skin: Negative; No rashes or skin lesions Psychiatric: Negative; No behavioral problems, depression Sleep: Fatigability:  No snoring, daytime sleepiness, hypersomnolence, bruxism, restless legs, hypnogognic hallucinations, no cataplexy Other comprehensive 14 point system review is negative.   PE BP (!) 144/87 (BP Location: Left Arm, Patient Position: Sitting, Cuff Size: Normal)   Pulse 61   Ht 5' 2" (1.575 m)   Wt 134 lb 9.6 oz (61.1 kg)   LMP 11/03/1993   SpO2 98%   BMI 24.62 kg/m    Repeat blood pressure by me was 116/74  Wt Readings from Last 3 Encounters:  02/01/21 132 lb 7.9 oz (60.1 kg)  01/27/21 134 lb 9.6 oz (61.1 kg)  12/02/20 132 lb 9.6 oz (60.1 kg)   General: Alert, oriented, no distress.  Skin: normal turgor, no rashes, warm and dry HEENT: Normocephalic, atraumatic. Pupils equal round and reactive to light; sclera anicteric; extraocular muscles intact;  Nose without nasal septal hypertrophy Mouth/Parynx benign; Mallinpatti scale 2 Neck: No JVD, no carotid bruits; normal carotid upstroke Lungs: clear to ausculatation and percussion; no wheezing or rales Chest wall: without tenderness to palpitation Heart: PMI not displaced, RRR, s1 s2 normal, 1/6 systolic murmur, no diastolic murmur, no rubs, gallops, thrills, or heaves Abdomen: soft, nontender;  no hepatosplenomehaly, BS+; abdominal aorta nontender and not dilated by palpation. Back: no CVA tenderness Pulses 2+ Musculoskeletal: full  range of motion, normal strength, no joint deformities Extremities: no clubbing cyanosis or edema, Homan's sign negative  Neurologic: grossly nonfocal; Cranial nerves grossly wnl Psychologic: Normal mood and affect  January 27, 2021 ECG (independently read by me):  NSR at 61, LAE, QS V1-2, inferolateral ST changes  February 08, 2019 ECG (independently read by me): Normal sinus rhythm at 66 bpm, the left atrial enlargement.  Previous inferior and anterolateral ST changes  November 07, 2018 ECG (independently read by me): Normal sinus rhythm at 69 bpm.  Possible left atrial enlargement.  Inferolateral T wave abnormality  October 2018 ECG (independently read by me): Normal sinus rhythm at 69 bpm.  Probable left atrial enlargement.  Inferolateral ST changes.  Normal intervals.  No ectopy.  June 2017 ECG (independently read by me): Normal sinus rhythm at 71 bpm.  Will biatrial enlargement.  Inferolateral ST-T wave abnormality.  Normal intervals.  March 2017 ECG (independently read by me):  Normal sinus rhythm at 77 bpm.  Biatrial enlargement.  Previously noted inferolateral T wave abnormalities.  November 2016 ECG (independently read by me): Normal sinus rhythm at 75 bpm.  Inferolateral ST-T wave abnormalities.  03/31/2014 ECG (independently read by me) : Normal sinus rhythm at 75 bpm.  There is now significantly more pronounced downsloping ST segment depression in leads II, III, and F V4 through V6 and T-wave inversion in V3 compared to her prior ECG of over one year ago.  Prior ECG: Normal sinus rhythm at 68. T-wave abnormalities  inferiorly in V3 through V6  LABS:   BMP Latest Ref Rng & Units 12/02/2020 03/02/2020 06/05/2019  Glucose 70 - 99 mg/dL 91 95 89  BUN 6 - 20 mg/dL _0 Creatinine 0.44 - 1.00 mg/dL 0.72 0.68 1.00  BUN/Creat Ratio 12 - 28 - 16 12  Sodium 135 - 145 mmol/L 137 143 144  Potassium 3.5 - 5.1 mmol/L 4.2 4.5 4.5  Chloride 98 - 111 mmol/L 103 106 106  CO2 22 - 32 mmol/L  _1 Calcium 8.9 - 10.3 mg/dL 8.5(L) 8.9 8.9    Hepatic Function Latest Ref Rng & Units 12/02/2020 03/02/2020 06/05/2019  Total Protein 6.5 - 8.1 g/dL 6.9 6.3 6.6  Albumin 3.5 - 5.0 g/dL 4.1 4.1 4.3  AST 15 - 41 U/L _2 ALT 0 - 44 U/L _3 Alk Phosphatase 38 - 126 U/L 113 139(H) 138(H)  Total Bilirubin 0.3 - 1.2 mg/dL 0.5 <0.2 0.2    CBC Latest Ref Rng & Units 12/02/2020 03/02/2020 06/05/2019  WBC 4.0 - 10.5 K/uL 7.7 7.7 7.1  Hemoglobin 12.0 - 15.0 g/dL 17.2(H) 16.5(H) 16.5(H)  Hematocrit 36.0 - 46.0 % 49.4(H) 47.3(H) 47.3(H)  Platelets 150 - 400 K/uL 166 160 156    Lab Results  Component Value Date   MCV 91.7 12/02/2020   MCV 90 03/02/2020   MCV 90 06/05/2019    Lab Results  Component Value Date   TSH 0.842 04/06/2018     Lab Results  Component Value Date   MCV 91.7 12/02/2020   MCV 90 03/02/2020   MCV 90 06/05/2019   Lab Results  Component Value Date   TSH 0.842 04/06/2018  No results found for: HGBA1C   Lipid Panel     Component Value Date/Time   CHOL 170 02/17/2017 0820   TRIG 65  02/17/2017 0820   HDL 70 02/17/2017 0820   CHOLHDL 2.4 02/17/2017 0820   LDLCALC 87 02/17/2017 0820    RADIOLOGY: No results found.  IMPRESSION:  1. Palpitations   2. Primary hypertension   3. Seizure disorder ([place])   4. History of breast cancer   5. Tobacco abuse     ASSESSMENT AND PLAN: Ms. Zailey Audia is a 60 year old female who has a long-standing tobacco history. In January 2009  cardiac catheterization at the [facility] revealed normal coronary arteries. A nuclear perfusion study in 2012 showed normal perfusion. A cardiopulmonary met test in July 2013 showed blunted chronotropic response without ischemic changes but she had reduced functional status with peak O2 at 69% of predicted. She has mild pulmonary hypertension.  An echo revealed normal function without wall motion abnormalities despite her ECG changes.  A subsequent stress test  in 2015 which was done because of more abnormal ST-T changes remain stable. A repeat echo Doppler study in November 2016 was unchanged and showed an EF of 60-65%.  There was trivial TR and MR.  Pulmonary pressures were normal.  In November 2019 due to recurrent intermittent episodes of chest tightness a Lexiscan Myoview study continued to show normal perfusion and was low risk.  She has been on Toprol-XL 100 mg twice a day with her history of increased heart rates.  Her event monitor from July 30 through November 28, 2018 revealed predominant sinus rhythm with average heart beat at 70 bpm.  There was mild sinus bradycardia which occurred during sleep.  Her fastest heart rate was sinus tachycardia at 112 bpm at 7 PM while active.  She did not have any episodes of AF, SVT ectopy or prolonged bradycardia.  Her blood pressure today is stable on current therapy.  Dr. Jannifer Franklin felt that her neuropathy most likely is due to her prior chemotherapy for which she had undergone for her breast cancer over 10 years ago.  Since her last evaluation with me 2 years ago, a subsequent cardiac monitor in October 2021 showed predominant sinus rhythm but she had short-lived bursts of SVT with the fastest lasting 6 beats at a maximum rate of 176 bpm and the longest lasting 15 beats with an average rate of 103 bpm.  There were few short episodes which may have been atrial tachycardia.  However, over the past year, on her current regimen of metoprolol which apparently is now just at 100 mg daily her resting pulse is 61.  Repeat blood pressure by me was 116/74.  She is followed closely by neurology and may have had a short-lived seizure recently.  She continues to be on Dilantin and Keppra.  She does not have any chest pain or chest tightness.  She is now off caffeine.  I have strongly encourage complete discontinuance of tobacco use.  She undergoes yearly low-dose chest CT in light of her longstanding tobacco history.  She will be seeing Dr.  Delton Coombes for oncology follow-up evaluation.  As long as she is stable cardiac wise, I will see her in 1 year for reevaluation.  Troy Sine, MD, [facility]  02/02/2021 7:55 PM

## 2021-01-27 NOTE — Patient Instructions (Signed)
Medication Instructions:  METOPROLOL SUCCINATE 100mg  DAILY REFILLED  *If you need a refill on your cardiac medications before your next appointment, please call your pharmacy*  Follow-Up: At [facility], you and your health needs are our priority.  As part of our continuing mission to provide you with exceptional heart care, we have created designated Provider Care Teams.  These Care Teams include your primary Cardiologist (physician) and Advanced Practice Providers (APPs -  Physician Assistants and Nurse Practitioners) who all work together to provide you with the care you need, when you need it.  Your next appointment:   1 year(s)  The format for your next appointment:   In Person  Provider:   Shelva Majestic, MD

## 2021-01-29 ENCOUNTER — Other Ambulatory Visit: Payer: Self-pay

## 2021-01-29 ENCOUNTER — Ambulatory Visit (HOSPITAL_COMMUNITY)
Admission: RE | Admit: 2021-01-29 | Discharge: 2021-01-29 | Disposition: A | Discharge status: Home / Self Care | Payer: Medicare Other | Source: Ambulatory Visit | Attending: Hematology | Admitting: Hematology

## 2021-01-29 DIAGNOSIS — Z122 Encounter for screening for malignant neoplasm of respiratory organs: Secondary | ICD-10-CM

## 2021-01-29 DIAGNOSIS — Z87891 Personal history of nicotine dependence: Secondary | ICD-10-CM | POA: Diagnosis not present

## 2021-01-29 DIAGNOSIS — F17210 Nicotine dependence, cigarettes, uncomplicated: Secondary | ICD-10-CM | POA: Diagnosis not present

## 2021-01-30 NOTE — Progress Notes (Signed)
Wendy Lopez [address] [place] [address]   Patient Care Team: Wenda Low, MD as PCP - General (Internal Medicine) Troy Sine, MD as PCP - Cardiology (Cardiology) Ashok Pall, MD as Consulting Physician (Neurosurgery)  SUMMARY OF ONCOLOGIC HISTORY: Oncology History  Cancer of right breast [facility])  01/13/2009 Initial Diagnosis   Needle core biopsy of 12 o'clock mass in right breast and axillary lymph node demonstrating invasive mammary carcinoma with negative lymph node.  ER 99%, PR 54%, Ki-67 15%, and Her2  negative.   02/13/2009 Definitive Surgery   Right simple mastectomy by Dr. Dalbert Batman demonstrating a 1.7 cm invasive ductal carcinoma, grade 1, no LVI, clear margins and 0/3 sentinel lymph nodes.    Oncotype testing   Intermediate-risk; was offered chemotherapy     Adjuvant Chemotherapy   Taxotere/Cytoxan x 3 cycles completed (unable to complete final cycle due to intolerance; patient weight was reportedly not dose-adjusted).     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  No subsequent anti-estrogen therapy was given.     Procedure   Per patient, she had genetic testing in the past; results were negative per her report.      CHIEF COMPLIANT: Follow-up for right breast cancer   INTERVAL HISTORY: Wendy Lopez is a 60 y.o. female here today for follow up of her right breast cancer. Her last visit was on 12/12/2018, Wendy Finders, NP-C.   Today she reports feeling well. She is currently smoking 1/2 ppd. She reports chronic back pain. She is currently taking 1000 units of vitamin B-12. She also reports fatigue. She is currently taking Keppra due to a history of seizures.   REVIEW OF SYSTEMS:   Review of Systems  Constitutional:  Positive for fatigue (25%). Negative for appetite change.   Cardiovascular:  Positive for palpitations.  Gastrointestinal:  Positive for nausea and vomiting.  Musculoskeletal:  Positive for arthralgias and back pain.  Neurological:  Positive for dizziness, headaches and numbness (R side face, hands, and feet).  Psychiatric/Behavioral:  Positive for sleep disturbance.   All other systems reviewed and are negative.  I have reviewed the past medical history, past surgical history, social history and family history with the patient and they are unchanged from previous note.   ALLERGIES:   is allergic to carbamazepine, lamictal [lamotrigine], other, pseudoephedrine hcl er, valium, amlodipine, oxycodone, prednisone, ibuprofen, percocet [oxycodone-acetaminophen], trokendi xr [topiramate er], betadine [povidone iodine], latex, and shellfish allergy.   MEDICATIONS:  Current Outpatient Medications  Medication Sig Dispense Refill   aspirin-acetaminophen-caffeine (EXCEDRIN MIGRAINE) 250-250-65 MG tablet Take 1 tablet by mouth as needed. Pain     cetirizine (ZYRTEC) 10 MG tablet Take by mouth.     cyanocobalamin 100 MCG tablet Take 100 mcg by mouth daily.     DILANTIN 100 MG ER capsule TAKE 1 CAPSULE(100 MG) BY MOUTH THREE TIMES DAILY 270 capsule 3   DILANTIN 30 MG ER capsule TAKE 1 CAPSULE(30 MG) BY MOUTH DAILY 90 capsule 1   levETIRAcetam (KEPPRA) 500 MG tablet TAKE 1 TABLET BY MOUTH EVERY MORNING AND 2 TABLETS IN THE EVENING 270 tablet 3   meclizine (ANTIVERT) 25 MG tablet Take by mouth.     metoprolol succinate (TOPROL-XL) 100 MG 24 hr tablet Take 1 tablet (100 mg total) by mouth daily. Take with or immediately following a meal.  90 tablet 3   Misc. Devices MISC Please provide patient with right breast prosthesis and 6 mastectomy bras sue to a history of right breast cancer and mastectomy. (ICD10- C50.111) 6 each 0   sodium chloride (OCEAN) 0.65 % nasal spray Place into the nose.     Vitamin D, Ergocalciferol, (DRISDOL) 1.25 MG (50000 UT) CAPS capsule Take  50,000 Units by mouth every 7 (seven) days.     No current facility-administered medications for this visit.     PHYSICAL EXAMINATION: Performance status (ECOG): 1 - Symptomatic but completely ambulatory  Vitals:   02/01/21 1308  BP: 136/84  Pulse: 69  Resp: 18  Temp: 98 F (36.7 C)  SpO2: 94%   Wt Readings from Last 3 Encounters:  02/01/21 132 lb 7.9 oz (60.1 kg)  01/27/21 134 lb 9.6 oz (61.1 kg)  12/02/20 132 lb 9.6 oz (60.1 kg)   Physical Exam Vitals reviewed.  Constitutional:      Appearance: Normal appearance.  Cardiovascular:     Rate and Rhythm: Normal rate and regular rhythm.     Pulses: Normal pulses.     Heart sounds: Normal heart sounds.  Pulmonary:     Effort: Pulmonary effort is normal.     Breath sounds: Normal breath sounds.  Abdominal:     Palpations: Abdomen is soft. There is no hepatomegaly, splenomegaly or mass.     Tenderness: There is no abdominal tenderness.  Lymphadenopathy:     Cervical: No cervical adenopathy.     Right cervical: No superficial cervical adenopathy.    Left cervical: No superficial cervical adenopathy.     Upper Body:     Right upper body: No supraclavicular, axillary or pectoral adenopathy.     Left upper body: No supraclavicular, axillary or pectoral adenopathy.  Neurological:     General: No focal deficit present.     Mental Status: She is alert and oriented to person, place, and time.  Psychiatric:        Mood and Affect: Mood normal.        Behavior: Behavior normal.    Breast Exam Chaperone: Thana Ates     LABORATORY DATA:  I have reviewed the data as listed CMP Latest Ref Rng & Units 12/02/2020 03/02/2020 06/05/2019  Glucose 70 - 99 mg/dL 91 95 89  BUN 6 - 20 mg/dL _0 Creatinine 0.44 - 1.00 mg/dL 0.72 0.68 1.00  Sodium 135 - 145 mmol/L 137 143 144  Potassium 3.5 - 5.1 mmol/L 4.2 4.5 4.5  Chloride 98 - 111 mmol/L 103 106 106  CO2 22 - 32 mmol/L _1 Calcium 8.9 - 10.3 mg/dL 8.5(L) 8.9 8.9   Total Protein 6.5 - 8.1 g/dL 6.9 6.3 6.6  Total Bilirubin 0.3 - 1.2 mg/dL 0.5 <0.2 0.2  Alkaline Phos 38 - 126 U/L 113 139(H) 138(H)  AST 15 - 41 U/L _2 ALT 0 - 44 U/L _3 No results found for: SPQ330 Lab Results  Component Value Date   WBC 7.7 12/02/2020   HGB 17.2 (H) 12/02/2020   HCT 49.4 (H) 12/02/2020   MCV 91.7 12/02/2020   PLT 166 12/02/2020   NEUTROABS 3.5 12/02/2020    ASSESSMENT:  1.  Stage Ia right breast cancer: - Diagnosed in September 2010, right mastectomy and SLNB-1.7 cm IDC, 0/3 lymph nodes positive, ER/PR positive and HER2 negative. - 3 cycles of Taxotere and Cytoxan chemotherapy. - Unable to tolerate antiestrogen  therapy.    PLAN:  1.  Stage Ia right breast cancer: - Reviewed mammogram from 12/16/2020 which was negative for malignancy. - Recommend follow-up in 1 year with repeat mammogram.    2.  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    3.  Smoking history: - Reviewed CT low-dose lung cancer screening scan from 01/29/2021. - It showed occlusion of the inferior lingular segmental bronchus and atelectasis possibly due to debris.  Obstructing endobronchial lesion cannot be excluded. - Will consider short-term chest CT with contrast in 1 month.  If it persists, will refer for bronchoscopy.  Breast Cancer therapy associated bone loss: I have recommended calcium, Vitamin D and weight bearing exercises.  Orders placed this encounter:  No orders of the defined types were placed in this encounter.   The patient has a good understanding of the overall plan. She agrees with it. She will call with any problems that may develop before the next visit here.  Derek Jack, MD [place] [phone]   I, Thana Ates, am acting as a scribe for Dr. Derek Jack.  I, Derek Jack MD, have reviewed the above documentation for accuracy and  completeness, and I agree with the above.

## 2021-02-01 ENCOUNTER — Telehealth: Payer: Self-pay | Admitting: Neurology

## 2021-02-01 ENCOUNTER — Other Ambulatory Visit: Payer: Self-pay

## 2021-02-01 ENCOUNTER — Inpatient Hospital Stay (HOSPITAL_COMMUNITY): Payer: Medicare Other | Attending: Hematology | Admitting: Hematology

## 2021-02-01 VITALS — BP 136/84 | HR 69 | Temp 98.0°F | Resp 18 | Wt 132.5 lb

## 2021-02-01 DIAGNOSIS — F17210 Nicotine dependence, cigarettes, uncomplicated: Secondary | ICD-10-CM | POA: Insufficient documentation

## 2021-02-01 DIAGNOSIS — Z170 Estrogen receptor positive status [ER+]: Secondary | ICD-10-CM

## 2021-02-01 DIAGNOSIS — D513 Other dietary vitamin B12 deficiency anemia: Secondary | ICD-10-CM

## 2021-02-01 DIAGNOSIS — Z87891 Personal history of nicotine dependence: Secondary | ICD-10-CM | POA: Diagnosis not present

## 2021-02-01 DIAGNOSIS — C50111 Malignant neoplasm of central portion of right female breast: Secondary | ICD-10-CM

## 2021-02-01 DIAGNOSIS — Z853 Personal history of malignant neoplasm of breast: Secondary | ICD-10-CM | POA: Diagnosis not present

## 2021-02-01 DIAGNOSIS — E559 Vitamin D deficiency, unspecified: Secondary | ICD-10-CM

## 2021-02-01 DIAGNOSIS — Z9221 Personal history of antineoplastic chemotherapy: Secondary | ICD-10-CM | POA: Diagnosis not present

## 2021-02-01 NOTE — Telephone Encounter (Signed)
Pt called wanting to know if she still needs to go and get labs. Pt requesting a call back.

## 2021-02-01 NOTE — Telephone Encounter (Signed)
Per Epic, there are labs pending from Derek Jack, MD (orders placed today for CBC w/ diff, CMP, B12, vitamin D). ______________________________________  She has continued her Dilantin and levetiracetam as prescribed.   She is asking if her seizure medication levels can be checked. She is having issues with dizziness, moodiness, agitation. Feels these problems are being caused by levetiracetam.

## 2021-02-01 NOTE — Patient Instructions (Signed)
[facility] at [facility] Discharge Instructions  You were seen and examined today by Dr. Katragadda. Please follow up as scheduled.    Thank you for choosing [facility] at [facility] to provide your oncology and hematology care.  To afford each patient quality time with our provider, please arrive at least 15 minutes before your scheduled appointment time.   If you have a lab appointment with the Cancer Center please come in thru the Main Entrance and check in at the main information desk.  You need to re-schedule your appointment should you arrive 10 or more minutes late.  We strive to give you quality time with our providers, and arriving late affects you and other patients whose appointments are after yours.  Also, if you no show three or more times for appointments you may be dismissed from the clinic at the providers discretion.     Again, thank you for choosing [facility].  Our hope is that these requests will decrease the amount of time that you wait before being seen by our physicians.       _____________________________________________________________  Should you have questions after your visit to  Cancer Center, please contact our office at [phone] and follow the prompts.  Our office hours are 8:00 a.m. and 4:30 p.m. Monday - Friday.  Please note that voicemails left after 4:00 p.m. may not be returned until the following business day.  We are closed weekends and major holidays.  You do have access to a nurse 24-7, just call the main number to the clinic [phone] and do not press any options, hold on the line and a nurse will answer the phone.    For prescription refill requests, have your pharmacy contact our office and allow 72 hours.    Due to Covid, you will need to wear a mask upon entering the hospital. If you do not have a mask, a mask will be given to you at the Main Entrance upon arrival. For  doctor visits, patients may have 1 support person age 18 or older with them. For treatment visits, patients can not have anyone with them due to social distancing guidelines and our immunocompromised population.      

## 2021-02-02 ENCOUNTER — Other Ambulatory Visit (HOSPITAL_COMMUNITY): Payer: Self-pay

## 2021-02-02 ENCOUNTER — Encounter: Payer: Self-pay | Admitting: Cardiovascular Disease

## 2021-02-02 DIAGNOSIS — C50111 Malignant neoplasm of central portion of right female breast: Secondary | ICD-10-CM

## 2021-02-02 DIAGNOSIS — Z720 Tobacco use: Secondary | ICD-10-CM

## 2021-02-02 DIAGNOSIS — R911 Solitary pulmonary nodule: Secondary | ICD-10-CM

## 2021-02-02 NOTE — Telephone Encounter (Signed)
I called the patient.  The patient has had problems with only A feeling bed, she is irritable which could be related to [place].  I am willing to switch her off of this and try Zonegran.  She has not tolerated a lot of medications, particularly generic medications in the past.  If she does decide that she wants to switch, she will let me know and I will initiate treatment with Zonegran and work up on the dose and eventually get her off of the [place].

## 2021-02-02 NOTE — Progress Notes (Signed)
Note from Dr. Delton Coombes- Please call her about the CT scan reports.  It showed some obstruction of the bronchus going to the lower part of the left lung.  She will need a repeat CT scan with contrast of the chest in 1 month.  She will need follow-up with [place] after that.  If it does not go away, will consider bronchoscopy.  Patient aware and agreeable with plane. CT scan of chest with contrast ordered.

## 2021-02-03 ENCOUNTER — Telehealth: Payer: Self-pay | Admitting: Cardiovascular Disease

## 2021-02-03 NOTE — Telephone Encounter (Signed)
LMTCB

## 2021-02-03 NOTE — Telephone Encounter (Addendum)
Pt called to report that she had a CT for Oncology and she is having a repeat CT with contrast for them 03/08/21.. she is asking to have Dr. Claiborne Billings look at her CT from 01/29/21... she would like to know if based on her results... does he think she may need another type of CT such as a cardiac CT instead of the one they are ordering... she wants to be sure that she is having everything she needs done before her deductible resets the end of the year.   I advised her that Dr. Claiborne Billings is out of the office but he will review when he returns and we will let her know what he recommends and she agrees.   On her previous CT:  Cardiovascular: Heart size. No pericardial effusion. No significant coronary artery calcifications. Atherosclerotic disease of the thoracic aorta.

## 2021-02-03 NOTE — Telephone Encounter (Signed)
Patient called in wanting to discuss the CT results and to see what Dr Claiborne Billings think she should do. Please advise

## 2021-02-09 NOTE — Telephone Encounter (Signed)
OK for CT per oncology

## 2021-03-08 ENCOUNTER — Ambulatory Visit (HOSPITAL_COMMUNITY)
Admission: RE | Admit: 2021-03-08 | Discharge: 2021-03-08 | Disposition: A | Discharge status: Home / Self Care | Payer: Medicare Other | Source: Ambulatory Visit | Attending: Hematology | Admitting: Hematology

## 2021-03-08 ENCOUNTER — Other Ambulatory Visit: Payer: Self-pay

## 2021-03-08 DIAGNOSIS — J439 Emphysema, unspecified: Secondary | ICD-10-CM | POA: Diagnosis not present

## 2021-03-08 DIAGNOSIS — R911 Solitary pulmonary nodule: Secondary | ICD-10-CM | POA: Diagnosis not present

## 2021-03-08 DIAGNOSIS — Z170 Estrogen receptor positive status [ER+]: Secondary | ICD-10-CM | POA: Diagnosis not present

## 2021-03-08 DIAGNOSIS — I700 Atherosclerosis of aorta: Secondary | ICD-10-CM | POA: Diagnosis not present

## 2021-03-08 DIAGNOSIS — C50111 Malignant neoplasm of central portion of right female breast: Secondary | ICD-10-CM | POA: Diagnosis not present

## 2021-03-08 DIAGNOSIS — Z720 Tobacco use: Secondary | ICD-10-CM | POA: Diagnosis not present

## 2021-03-08 LAB — POCT I-STAT CREATININE: Creatinine, Ser: 0.7 mg/dL (ref 0.44–1.00)

## 2021-03-08 MED ORDER — IOHEXOL 300 MG/ML  SOLN
75.0000 mL | Freq: Once | INTRAMUSCULAR | Status: AC | PRN
  Administered 2021-03-08: 75 mL via INTRAVENOUS

## 2021-03-08 NOTE — Telephone Encounter (Signed)
CT chest was ordered- completed today 11/21

## 2021-03-15 NOTE — Progress Notes (Signed)
 [facility] [address] [place], [place] [address]   Patient Care Team: Husain, Karrar, MD as PCP - General (Internal Medicine) Kelly, Thomas A, MD as PCP - Cardiology (Cardiology) Cabbell, Kyle, MD as Consulting Physician (Neurosurgery)  SUMMARY OF ONCOLOGIC HISTORY: Oncology History  Cancer of right breast (HCC)  01/13/2009 Initial Diagnosis   Needle core biopsy of 12 o'clock mass in right breast and axillary lymph node demonstrating invasive mammary carcinoma with negative lymph node.  ER 99%, PR 54%, Ki-67 15%, and Her2  negative.   02/13/2009 Definitive Surgery   Right simple mastectomy by Dr. ingram demonstrating a 1.7 cm invasive ductal carcinoma, grade 1, no LVI, clear margins and 0/3 sentinel lymph nodes.    Oncotype testing   Intermediate-risk; was offered chemotherapy     Adjuvant Chemotherapy   Taxotere/Cytoxan x 3 cycles completed (unable to complete final cycle due to intolerance; patient weight was reportedly not dose-adjusted).     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  No subsequent anti-estrogen therapy was given.     Procedure   Per patient, she had genetic testing in the past; results were negative per her report.      CHIEF COMPLIANT: Follow-up for right breast cancer   INTERVAL HISTORY: Ms. Wendy Lopez is a 60 y.o. female here today for follow up of her right breast cancer. Her last visit was on 02/01/2021.   Today she reports feeling good. She denies SOB. She reports chest tightness under her sternum.   REVIEW OF SYSTEMS:   Review of Systems  Constitutional:  Negative for appetite change and fatigue (50%).  Respiratory:  Positive for chest tightness. Negative for shortness of breath.   Cardiovascular:  Positive for chest pain and palpitations.  Musculoskeletal:  Positive for  arthralgias (4/10).  Neurological:  Positive for dizziness, headaches and numbness.  All other systems reviewed and are negative.  I have reviewed the past medical history, past surgical history, social history and family history with the patient and they are unchanged from previous note.   ALLERGIES:   is allergic to carbamazepine, lamictal [lamotrigine], other, pseudoephedrine hcl er, valium, amlodipine, oxycodone, prednisone, ibuprofen, percocet [oxycodone-acetaminophen], trokendi xr [topiramate er], betadine [povidone iodine], latex, and shellfish allergy.   MEDICATIONS:  Current Outpatient Medications  Medication Sig Dispense Refill   aspirin-acetaminophen-caffeine (EXCEDRIN MIGRAINE) 250-250-65 MG tablet Take 1 tablet by mouth as needed. Pain     cetirizine (ZYRTEC) 10 MG tablet Take by mouth.     cyanocobalamin 100 MCG tablet Take 100 mcg by mouth daily.     DILANTIN 100 MG ER capsule TAKE 1 CAPSULE(100 MG) BY MOUTH THREE TIMES DAILY 270 capsule 3   DILANTIN 30 MG ER capsule TAKE 1 CAPSULE(30 MG) BY MOUTH DAILY 90 capsule 1   levETIRAcetam (KEPPRA) 500 MG tablet TAKE 1 TABLET BY MOUTH EVERY MORNING AND 2 TABLETS IN THE EVENING 270 tablet 3   meclizine (ANTIVERT) 25 MG tablet Take by mouth.     metoprolol succinate (TOPROL-XL) 100 MG 24 hr tablet Take 1 tablet (100 mg total) by mouth daily. Take with or immediately following a meal. 90 tablet 3   Misc. Devices MISC Please provide patient with right breast prosthesis and 6 mastectomy bras sue to a history of right breast cancer and mastectomy. (ICD10- C50.111) 6 each 0   sodium   chloride (OCEAN) 0.65 % nasal spray Place into the nose.     Vitamin D, Ergocalciferol, (DRISDOL) 1.25 MG (50000 UT) CAPS capsule Take 50,000 Units by mouth every 7 (seven) days.     No current facility-administered medications for this visit.     PHYSICAL EXAMINATION: Performance status (ECOG): 1 - Symptomatic but completely ambulatory  There were no  vitals filed for this visit. Wt Readings from Last 3 Encounters:  02/01/21 132 lb 7.9 oz (60.1 kg)  01/27/21 134 lb 9.6 oz (61.1 kg)  12/02/20 132 lb 9.6 oz (60.1 kg)   Physical Exam Vitals reviewed.  Constitutional:      Appearance: Normal appearance.  Cardiovascular:     Rate and Rhythm: Normal rate and regular rhythm.     Pulses: Normal pulses.     Heart sounds: Normal heart sounds.  Pulmonary:     Effort: Pulmonary effort is normal.     Breath sounds: Normal breath sounds.  Chest:  Breasts:    Right: Absent. Skin change: slight thickness in center of mastectomy scar; scar stable.     Left: Normal.  Lymphadenopathy:     Upper Body:     Right upper body: No supraclavicular, axillary or pectoral adenopathy.     Left upper body: No supraclavicular, axillary or pectoral adenopathy.  Neurological:     General: No focal deficit present.     Mental Status: She is alert and oriented to person, place, and time.  Psychiatric:        Mood and Affect: Mood normal.        Behavior: Behavior normal.    Breast Exam Chaperone: Kirstyn Evans     LABORATORY DATA:  I have reviewed the data as listed CMP Latest Ref Rng & Units 03/08/2021 12/02/2020 03/02/2020  Glucose 70 - 99 mg/dL - 91 95  BUN 6 - 20 mg/dL - 10 11  Creatinine 0.44 - 1.00 mg/dL 0.70 0.72 0.68  Sodium 135 - 145 mmol/L - 137 143  Potassium 3.5 - 5.1 mmol/L - 4.2 4.5  Chloride 98 - 111 mmol/L - 103 106  CO2 22 - 32 mmol/L - 28 26  Calcium 8.9 - 10.3 mg/dL - 8.5(L) 8.9  Total Protein 6.5 - 8.1 g/dL - 6.9 6.3  Total Bilirubin 0.3 - 1.2 mg/dL - 0.5 <0.2  Alkaline Phos 38 - 126 U/L - 113 139(H)  AST 15 - 41 U/L - 20 14  ALT 0 - 44 U/L - 24 15   No results found for: CAN153 Lab Results  Component Value Date   WBC 7.7 12/02/2020   HGB 17.2 (H) 12/02/2020   HCT 49.4 (H) 12/02/2020   MCV 91.7 12/02/2020   PLT 166 12/02/2020   NEUTROABS 3.5 12/02/2020    ASSESSMENT:  1.  Stage Ia right breast cancer: - Diagnosed  in September 2010, right mastectomy and SLNB-1.7 cm IDC, 0/3 lymph nodes positive, ER/PR positive and HER2 negative. - 3 cycles of Taxotere and Cytoxan chemotherapy. - Unable to tolerate antiestrogen therapy.   PLAN:  1.  Stage Ia right breast cancer: - Mammogram on 12/16/2020 negative for malignancy. - Physical examination today did not reveal any palpable masses.  Right mastectomy site within normal limits. - Continue yearly mammograms.     2.  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     3.  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 - We reviewed CT from 03/09/2021.  Unchanged proximal occlusion of the left inferior segment lingular bronchus with associated atelectasis.  No direct evidence of mass or other obstructing lesion. - Recommend pulmonary evaluation for bronchoscopy to see if there is any endobronchial lesion. - Recommend follow-up in 3 months.  Breast Cancer therapy associated bone loss: I have recommended calcium, Vitamin D and weight bearing exercises.  Orders placed this encounter:  No orders of the defined types were placed in this encounter.   The patient has a good understanding of the overall plan. She agrees with it. She will call with any problems that may develop before the next visit here.  Derek Jack, MD [place] [phone]   I, Thana Ates, am acting as a scribe for Dr. Derek Jack.  I, Derek Jack MD, have reviewed the above documentation for accuracy and completeness, and I agree with the above.

## 2021-03-16 ENCOUNTER — Other Ambulatory Visit: Payer: Self-pay

## 2021-03-16 ENCOUNTER — Inpatient Hospital Stay (HOSPITAL_COMMUNITY): Payer: Medicare Other | Attending: Hematology | Admitting: Hematology

## 2021-03-16 VITALS — BP 125/72 | HR 71 | Temp 96.9°F | Resp 17 | Wt 131.2 lb

## 2021-03-16 DIAGNOSIS — Z888 Allergy status to other drugs, medicaments and biological substances status: Secondary | ICD-10-CM | POA: Insufficient documentation

## 2021-03-16 DIAGNOSIS — R002 Palpitations: Secondary | ICD-10-CM | POA: Diagnosis not present

## 2021-03-16 DIAGNOSIS — R0789 Other chest pain: Secondary | ICD-10-CM | POA: Diagnosis not present

## 2021-03-16 DIAGNOSIS — Z886 Allergy status to analgesic agent status: Secondary | ICD-10-CM | POA: Diagnosis not present

## 2021-03-16 DIAGNOSIS — Z170 Estrogen receptor positive status [ER+]: Secondary | ICD-10-CM | POA: Diagnosis not present

## 2021-03-16 DIAGNOSIS — R42 Dizziness and giddiness: Secondary | ICD-10-CM | POA: Insufficient documentation

## 2021-03-16 DIAGNOSIS — Z79899 Other long term (current) drug therapy: Secondary | ICD-10-CM | POA: Diagnosis not present

## 2021-03-16 DIAGNOSIS — E559 Vitamin D deficiency, unspecified: Secondary | ICD-10-CM

## 2021-03-16 DIAGNOSIS — Z885 Allergy status to narcotic agent status: Secondary | ICD-10-CM | POA: Insufficient documentation

## 2021-03-16 DIAGNOSIS — C50911 Malignant neoplasm of unspecified site of right female breast: Secondary | ICD-10-CM | POA: Insufficient documentation

## 2021-03-16 DIAGNOSIS — R519 Headache, unspecified: Secondary | ICD-10-CM | POA: Diagnosis not present

## 2021-03-16 DIAGNOSIS — D513 Other dietary vitamin B12 deficiency anemia: Secondary | ICD-10-CM | POA: Diagnosis not present

## 2021-03-16 DIAGNOSIS — Z87891 Personal history of nicotine dependence: Secondary | ICD-10-CM | POA: Diagnosis not present

## 2021-03-16 DIAGNOSIS — M2550 Pain in unspecified joint: Secondary | ICD-10-CM | POA: Insufficient documentation

## 2021-03-16 DIAGNOSIS — C50111 Malignant neoplasm of central portion of right female breast: Secondary | ICD-10-CM

## 2021-03-16 DIAGNOSIS — R200 Anesthesia of skin: Secondary | ICD-10-CM | POA: Diagnosis not present

## 2021-03-16 NOTE — Patient Instructions (Addendum)
[place] at [facility] Discharge Instructions   You were seen and examined today by Dr. Delton Coombes.  We will refer you to a pulmonologist to further investigate the lung findings on CT.  Return to clinic as scheduled.     Thank you for choosing [place] at [facility] to provide your oncology and hematology care.  To afford each patient quality time with our provider, please arrive at least 15 minutes before your scheduled appointment time.   If you have a lab appointment with the [facility] please come in thru the Main Entrance and check in at the main information desk.  You need to re-schedule your appointment should you arrive 10 or more minutes late.  We strive to give you quality time with our providers, and arriving late affects you and other patients whose appointments are after yours.  Also, if you no show three or more times for appointments you may be dismissed from the clinic at the providers discretion.     Again, thank you for choosing [facility].  Our hope is that these requests will decrease the amount of time that you wait before being seen by our physicians.       _____________________________________________________________  Should you have questions after your visit to [facility], please contact our office at [phone] and follow the prompts.  Our office hours are 8:00 a.m. and 4:30 p.m. Monday - Friday.  Please note that voicemails left after 4:00 p.m. may not be returned until the following business day.  We are closed weekends and major holidays.  You do have access to a nurse 24-7, just call the main number to the clinic [phone] and do not press any options, hold on the line and a nurse will answer the phone.    For prescription refill requests, have your pharmacy contact our office and allow 72 hours.    Due to Covid, you will need to wear a mask upon entering the hospital. If  you do not have a mask, a mask will be given to you at the Main Entrance upon arrival. For doctor visits, patients may have 1 support person age 61 or older with them. For treatment visits, patients can not have anyone with them due to social distancing guidelines and our immunocompromised population.

## 2021-03-17 ENCOUNTER — Institutional Professional Consult (permissible substitution): Payer: Medicare Other | Admitting: Pulmonary Disease

## 2021-03-24 ENCOUNTER — Other Ambulatory Visit: Payer: Self-pay

## 2021-03-24 ENCOUNTER — Ambulatory Visit (INDEPENDENT_AMBULATORY_CARE_PROVIDER_SITE_OTHER): Payer: Medicare Other | Admitting: Pulmonary Disease

## 2021-03-24 ENCOUNTER — Encounter: Payer: Self-pay | Admitting: Pulmonary Disease

## 2021-03-24 ENCOUNTER — Telehealth: Payer: Self-pay | Admitting: Pulmonary Disease

## 2021-03-24 VITALS — BP 124/74 | HR 73 | Temp 98.4°F | Ht 62.0 in | Wt 132.6 lb

## 2021-03-24 DIAGNOSIS — F17200 Nicotine dependence, unspecified, uncomplicated: Secondary | ICD-10-CM

## 2021-03-24 DIAGNOSIS — R918 Other nonspecific abnormal finding of lung field: Secondary | ICD-10-CM | POA: Diagnosis not present

## 2021-03-24 DIAGNOSIS — Z853 Personal history of malignant neoplasm of breast: Secondary | ICD-10-CM | POA: Diagnosis not present

## 2021-03-24 NOTE — Telephone Encounter (Signed)
Bronch has been scheduled for 12/15 at 12:15 at [facility].  Pt will go for covid test on 12/12.  Gave appt info to pt.

## 2021-03-24 NOTE — Progress Notes (Signed)
 Synopsis: Referred in December 2022 for lingular occlusion on CT by Katragadda, Sreedhar, MD  Subjective:   PATIENT ID: Wendy Lopez GENDER: female DOB: 02/03/1961, MRN: [id number]  Chief Complaint  Patient presents with   Consult    Patient is here to talk about spot on lung    This is a 60-year-old female, past medical history of breast cancer status postmastectomy and chemo, longstanding history of tobacco use.  She is current smoker.  She had a lung cancer screening CT in October 2022.  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  Patient was referred for evaluation after repeat CT imaging in November showed persistence of the occlusion in the left lung.  She rarely uses her inhaler.  And she denies hemoptysis.   Past Medical History:  Diagnosis Date   Anxiety and depression    Arthritis    Breast cancer (HCC) 02/13/2009   Cancer of right breast (HCC) 04/16/2012   Depression    Depression 2014   Dysrhythmia    palpitations   Epilepsy (HCC)    last seizure 1990's   H/O hiatal hernia    Localization-related (focal) (partial) epilepsy and epileptic syndromes with complex partial seizures, without mention of intractable epilepsy 02/07/2013   Memory deficit 02/07/2013   Memory loss    Mitral valve prolapse 06/01/10   echo- EF>55% mild tricupsid regurgitation. There is mild Pulmonary Hypertension.. Right  ventricular systolic pressure iselevated at 30-40 mmHg. LV systolic function is normal.   Osteopenia    PONV (postoperative nausea and vomiting)    SOB (shortness of breath) 10/18/11   met-test   Spinal fusion failure    3     Family History  Problem Relation Age of Onset   Arthritis Mother        rhuematoid   Heart disease Mother        Atrial fib   Heart disease Father    Heart disease Sister        Atrial fib     Past Surgical History:  Procedure Laterality Date   BACK SURGERY     screws- lumbar   BREAST SURGERY  2010    mastectomy - RIGHT   COLONOSCOPY WITH PROPOFOL  03/13/2012   Procedure: COLONOSCOPY WITH PROPOFOL;  Surgeon: Martin K Johnson, MD;  Location: [facility];  Service: Endoscopy;  Laterality: N/A;   ESOPHAGOGASTRODUODENOSCOPY  03/13/2012   Procedure: ESOPHAGOGASTRODUODENOSCOPY (EGD);  Surgeon: Martin K Johnson, MD;  Location: [facility];  Service: Endoscopy;  Laterality: N/A;  Reflux, Hiatal Hernia   HAND TENDON SURGERY  2012   tendons released in right hand    MASTECTOMY, RADICAL  02/13/2009   SPINE SURGERY  1999, 2003, 2005   TONSILLECTOMY      Social History   Socioeconomic History   Marital status: Married    Spouse name: Not on file   Number of children: 1   Years of education: college 2   Highest education level: Not on file  Occupational History   Occupation: unemployed    Employer: UNEMPLOYED    Comment: 2003  Tobacco Use   Smoking status: Every Day    Packs/day: 1.00    Years: 43.00    Pack years: 43.00    Types: Cigarettes   Smokeless tobacco: Never   Tobacco comments:    1/2 pk per day  Substance and Sexual Activity   Alcohol use: No    Alcohol/week: 0.0 standard drinks     Drug use: No   Sexual activity: Not on file  Other Topics Concern   Not on file  Social History Narrative   Patient lives at home with husband   Patient is right handed.   Patient does not drink caffeine.   Social Determinants of Health   Financial Resource Strain: Not on file  Food Insecurity: Not on file  Transportation Needs: Not on file  Physical Activity: Not on file  Stress: Not on file  Social Connections: Not on file  Intimate Partner Violence: Not on file     Allergies  Allergen Reactions   Carbamazepine Anaphylaxis   Lamictal [Lamotrigine] Anaphylaxis    SOB, Lamotrigine only   Other     STATES HAS SHORTNESS OF BREATH WITH GENERIC DRUGS     ALSO RASH AND ITCHING   Pseudoephedrine Hcl Er Other (See Comments)    SEIZURES.   Valium Other (See Comments)     Hallucinations, spasticity, hyper-relfexive    Amlodipine Rash    Face swell   Oxycodone Nausea And Vomiting and Rash   Prednisone Rash and Other (See Comments)    Prescribing MD told patient he thinks dose (dose pack) was "just too high." 03/09/11   STATES HAS TAKEN SMALLER DOSE AND STILL HAD RASH AND JITTERINESS   Ibuprofen Swelling   Percocet [Oxycodone-Acetaminophen] Nausea And Vomiting   Trokendi Kell[employer] Er] Other (See Comments)    Made headache worse, dizziness, difficulty breathing, rash   Betadine [Povidone Iodine] Rash   Latex Dermatitis and Rash   Shellfish Allergy Swelling     Outpatient Medications Prior to Visit  Medication Sig Dispense Refill   albuterol (VENTOLIN HFA) 108 (90 Base) MCG/ACT inhaler 2 puffs as needed     cetirizine (ZYRTEC) 10 MG tablet Take by mouth.     Cyanocobalamin (VITAMIN B12) 1000 MCG TBCR 1 tablet     DILANTIN 100 MG ER capsule TAKE 1 CAPSULE(100 MG) BY MOUTH THREE TIMES DAILY 270 capsule 3   DILANTIN 30 MG ER capsule TAKE 1 CAPSULE(30 MG) BY MOUTH DAILY 90 capsule 1   diltiazem (CARDIZEM) 30 MG tablet 1 tablet     EPINEPHrine 0.3 mg/0.3 mL IJ SOAJ injection See admin instructions.     levETIRAcetam (KEPPRA) 500 MG tablet TAKE 1 TABLET BY MOUTH EVERY MORNING AND 2 TABLETS IN THE EVENING 270 tablet 3   meclizine (ANTIVERT) 25 MG tablet Take by mouth.     metoprolol succinate (TOPROL-XL) 100 MG 24 hr tablet Take 1 tablet (100 mg total) by mouth daily. Take with or immediately following a meal. 90 tablet 3   Misc. Devices MISC Please provide patient with right breast prosthesis and 6 mastectomy bras sue to a history of right breast cancer and mastectomy. (ICD10- C50.111) 6 each 0   pantoprazole (PROTONIX) 20 MG tablet 1 tablet     sodium chloride (OCEAN) 0.65 % nasal spray Place into the nose.     Vitamin D, Ergocalciferol, (DRISDOL) 1.25 MG (50000 UT) CAPS capsule Take 50,000 Units by mouth every 7 (seven) days.      aspirin-acetaminophen-caffeine (EXCEDRIN MIGRAINE) 250-250-65 MG tablet Take 1 tablet by mouth as needed. Pain (Patient not taking: Reported on 03/16/2021)     No facility-administered medications prior to visit.    Review of Systems  Constitutional:  Negative for chills, fever, malaise/fatigue and weight loss.  HENT:  Negative for hearing loss, sore throat and tinnitus.   Eyes:  Negative for blurred vision and double vision.  Respiratory:  Positive for cough and shortness of breath. Negative for hemoptysis, sputum production, wheezing and stridor.   Cardiovascular:  Negative for chest pain, palpitations, orthopnea, leg swelling and PND.  Gastrointestinal:  Negative for abdominal pain, constipation, diarrhea, heartburn, nausea and vomiting.  Genitourinary:  Negative for dysuria, hematuria and urgency.  Musculoskeletal:  Negative for joint pain and myalgias.  Skin:  Negative for itching and rash.  Neurological:  Negative for dizziness, tingling, weakness and headaches.  Endo/Heme/Allergies:  Negative for environmental allergies. Does not bruise/bleed easily.  Psychiatric/Behavioral:  Negative for depression. The patient is not nervous/anxious and does not have insomnia.   All other systems reviewed and are negative.   Objective:  Physical Exam Vitals reviewed.  Constitutional:      General: She is not in acute distress.    Appearance: She is well-developed.  HENT:     Head: Normocephalic and atraumatic.  Eyes:     General: No scleral icterus.    Conjunctiva/sclera: Conjunctivae normal.     Pupils: Pupils are equal, round, and reactive to light.  Neck:     Vascular: No JVD.     Trachea: No tracheal deviation.  Cardiovascular:     Rate and Rhythm: Normal rate and regular rhythm.     Heart sounds: Normal heart sounds. No murmur heard. Pulmonary:     Effort: Pulmonary effort is normal. No tachypnea, accessory muscle usage or respiratory distress.     Breath sounds: No stridor. No  wheezing, rhonchi or rales.  Abdominal:     General: Bowel sounds are normal. There is no distension.     Palpations: Abdomen is soft.     Tenderness: There is no abdominal tenderness.  Musculoskeletal:        General: No tenderness.     Cervical back: Neck supple.  Lymphadenopathy:     Cervical: No cervical adenopathy.  Skin:    General: Skin is warm and dry.     Capillary Refill: Capillary refill takes less than 2 seconds.     Findings: No rash.  Neurological:     Mental Status: She is alert and oriented to person, place, and time.  Psychiatric:        Behavior: Behavior normal.     Vitals:   03/24/21 1355  BP: 124/74  Pulse: 73  Temp: 98.4 F (36.9 C)  TempSrc: Oral  SpO2: 97%  Weight: 132 lb 9.6 oz (60.1 kg)  Height: 5' 2" (1.575 m)   97% on RA BMI Readings from Last 3 Encounters:  03/24/21 24.25 kg/m  03/16/21 24.00 kg/m  02/01/21 24.23 kg/m   Wt Readings from Last 3 Encounters:  03/24/21 132 lb 9.6 oz (60.1 kg)  03/16/21 131 lb 3.2 oz (59.5 kg)  02/01/21 132 lb 7.9 oz (60.1 kg)     CBC    Component Value Date/Time   WBC 7.7 12/02/2020 1210   RBC 5.39 (H) 12/02/2020 1210   HGB 17.2 (H) 12/02/2020 1210   HGB 16.5 (H) 03/02/2020 0917   HGB 16.4 (H) 12/28/2009 0941   HCT 49.4 (H) 12/02/2020 1210   HCT 47.3 (H) 03/02/2020 0917   HCT 47.1 (H) 12/28/2009 0941   PLT 166 12/02/2020 1210   PLT 160 03/02/2020 0917   MCV 91.7 12/02/2020 1210   MCV 90 03/02/2020 0917   MCV 92.8 12/28/2009 0941   MCH 31.9 12/02/2020 1210   MCHC 34.8 12/02/2020 1210   RDW 13.1 12/02/2020 1210   RDW 12.5 03/02/2020 0917     RDW 12.8 12/28/2009 0941   LYMPHSABS 3.4 12/02/2020 1210   LYMPHSABS 3.7 (H) 03/02/2020 0917   LYMPHSABS 2.0 12/28/2009 0941   MONOABS 0.6 12/02/2020 1210   MONOABS 0.6 12/28/2009 0941   EOSABS 0.1 12/02/2020 1210   EOSABS 0.2 03/02/2020 0917   BASOSABS 0.1 12/02/2020 1210   BASOSABS 0.1 03/02/2020 0917   BASOSABS 0.1 12/28/2009 0941    Chest  Imaging: 03/08/2021 CT chest: 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 The patient's images have been independently reviewed by me.    Pulmonary Functions Testing Results: No flowsheet data found.  FeNO:   Pathology:   Echocardiogram:   Heart Catheterization:     Assessment & Plan:     ICD-10-CM   1. Endobronchial mass  R91.8 Ambulatory referral to Pulmonology    Procedural/ Surgical Case Request: VIDEO BRONCHOSCOPY WITHOUT FLUORO    2. Current smoker  F17.200     3. Abnormal CT lung screening  R91.8     4. History of breast cancer  Z85.3       Discussion:  This is a 60-year-old female with a history of breast cancer.  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  Patient was referred by medical oncology for evaluation via bronchoscopy.  I also discussed today the possibility of proceeding with PET scan but even if the PET scan was equivocal I explained that she would likely need visualization of the airway.  Plan: We will plan to take patient for video bronchoscopy. We test gust the risk benefits and alternatives of proceeding with bronchoscopy. We also talked about the risk of bleeding if in fact there was a lesion identified within the airway of the inferior segment of the lingula that would need biopsy. Patient is agreeable to proceed with bronchoscopy. We will tentatively schedule for next week sometime in the afternoon at [facility].  Patient is agreeable to to this plan. We appreciate the consultation.   Current Outpatient Medications:    albuterol (VENTOLIN HFA) 108 (90 Base) MCG/ACT inhaler, 2 puffs as needed, Disp: , Rfl:    cetirizine (ZYRTEC) 10 MG tablet, Take by mouth., Disp: , Rfl:    Cyanocobalamin (VITAMIN B12) 1000 MCG TBCR, 1 tablet, Disp: , Rfl:    DILANTIN 100 MG ER capsule, TAKE 1 CAPSULE(100 MG) BY MOUTH THREE TIMES DAILY, Disp: 270 capsule, Rfl: 3   DILANTIN 30 MG ER  capsule, TAKE 1 CAPSULE(30 MG) BY MOUTH DAILY, Disp: 90 capsule, Rfl: 1   diltiazem (CARDIZEM) 30 MG tablet, 1 tablet, Disp: , Rfl:    EPINEPHrine 0.3 mg/0.3 mL IJ SOAJ injection, See admin instructions., Disp: , Rfl:    levETIRAcetam (KEPPRA) 500 MG tablet, TAKE 1 TABLET BY MOUTH EVERY MORNING AND 2 TABLETS IN THE EVENING, Disp: 270 tablet, Rfl: 3   meclizine (ANTIVERT) 25 MG tablet, Take by mouth., Disp: , Rfl:    metoprolol succinate (TOPROL-XL) 100 MG 24 hr tablet, Take 1 tablet (100 mg total) by mouth daily. Take with or immediately following a meal., Disp: 90 tablet, Rfl: 3   Misc. Devices MISC, Please provide patient with right breast prosthesis and 6 mastectomy bras sue to a history of right breast cancer and mastectomy. (ICD10- C50.111), Disp: 6 each, Rfl: 0   pantoprazole (PROTONIX) 20 MG tablet, 1 tablet, Disp: , Rfl:    sodium chloride (OCEAN) 0.65 % nasal spray, Place into the nose., Disp: , Rfl:    Vitamin   D, Ergocalciferol, (DRISDOL) 1.25 MG (50000 UT) CAPS capsule, Take 50,000 Units by mouth every 7 (seven) days., Disp: , Rfl:    aspirin-acetaminophen-caffeine (EXCEDRIN MIGRAINE) 250-250-65 MG tablet, Take 1 tablet by mouth as needed. Pain (Patient not taking: Reported on 03/16/2021), Disp: , Rfl:   I spent 62 minutes dedicated to the care of this patient on the date of this encounter to include pre-visit review of records, face-to-face time with the patient discussing conditions above, post visit ordering of testing, clinical documentation with the electronic health record, making appropriate referrals as documented, and communicating necessary findings to members of the patients care team.   Bradley L Icard, DO [facility] 03/24/2021 2:31 PM    

## 2021-03-24 NOTE — H&P (View-Only) (Signed)
 Synopsis: Referred in December 2022 for lingular occlusion on CT by Katragadda, Sreedhar, MD  Subjective:   PATIENT ID: Wendy Lopez GENDER: female DOB: 12/23/1960, MRN: [id number]  Chief Complaint  Patient presents with   Consult    Patient is here to talk about spot on lung    This is a 60-year-old female, past medical history of breast cancer status postmastectomy and chemo, longstanding history of tobacco use.  She is current smoker.  She had a lung cancer screening CT in October 2022.  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  Patient was referred for evaluation after repeat CT imaging in November showed persistence of the occlusion in the left lung.  She rarely uses her inhaler.  And she denies hemoptysis.   Past Medical History:  Diagnosis Date   Anxiety and depression    Arthritis    Breast cancer (HCC) 02/13/2009   Cancer of right breast (HCC) 04/16/2012   Depression    Depression 2014   Dysrhythmia    palpitations   Epilepsy (HCC)    last seizure 1990's   H/O hiatal hernia    Localization-related (focal) (partial) epilepsy and epileptic syndromes with complex partial seizures, without mention of intractable epilepsy 02/07/2013   Memory deficit 02/07/2013   Memory loss    Mitral valve prolapse 06/01/10   echo- EF>55% mild tricupsid regurgitation. There is mild Pulmonary Hypertension.. Right  ventricular systolic pressure iselevated at 30-40 mmHg. LV systolic function is normal.   Osteopenia    PONV (postoperative nausea and vomiting)    SOB (shortness of breath) 10/18/11   met-test   Spinal fusion failure    3     Family History  Problem Relation Age of Onset   Arthritis Mother        rhuematoid   Heart disease Mother        Atrial fib   Heart disease Father    Heart disease Sister        Atrial fib     Past Surgical History:  Procedure Laterality Date   BACK SURGERY     screws- lumbar   BREAST SURGERY  2010    mastectomy - RIGHT   COLONOSCOPY WITH PROPOFOL  03/13/2012   Procedure: COLONOSCOPY WITH PROPOFOL;  Surgeon: Martin K Johnson, MD;  Location: [facility];  Service: Endoscopy;  Laterality: N/A;   ESOPHAGOGASTRODUODENOSCOPY  03/13/2012   Procedure: ESOPHAGOGASTRODUODENOSCOPY (EGD);  Surgeon: Martin K Johnson, MD;  Location: [facility];  Service: Endoscopy;  Laterality: N/A;  Reflux, Hiatal Hernia   HAND TENDON SURGERY  2012   tendons released in right hand    MASTECTOMY, RADICAL  02/13/2009   SPINE SURGERY  1999, 2003, 2005   TONSILLECTOMY      Social History   Socioeconomic History   Marital status: Married    Spouse name: Not on file   Number of children: 1   Years of education: college 2   Highest education level: Not on file  Occupational History   Occupation: unemployed    Employer: UNEMPLOYED    Comment: 2003  Tobacco Use   Smoking status: Every Day    Packs/day: 1.00    Years: 43.00    Pack years: 43.00    Types: Cigarettes   Smokeless tobacco: Never   Tobacco comments:    1/2 pk per day  Substance and Sexual Activity   Alcohol use: No    Alcohol/week: 0.0 standard drinks     Drug use: No   Sexual activity: Not on file  Other Topics Concern   Not on file  Social History Narrative   Patient lives at home with husband   Patient is right handed.   Patient does not drink caffeine.   Social Determinants of Health   Financial Resource Strain: Not on file  Food Insecurity: Not on file  Transportation Needs: Not on file  Physical Activity: Not on file  Stress: Not on file  Social Connections: Not on file  Intimate Partner Violence: Not on file     Allergies  Allergen Reactions   Carbamazepine Anaphylaxis   Lamictal [Lamotrigine] Anaphylaxis    SOB, Lamotrigine only   Other     STATES HAS SHORTNESS OF BREATH WITH GENERIC DRUGS     ALSO RASH AND ITCHING   Pseudoephedrine Hcl Er Other (See Comments)    SEIZURES.   Valium Other (See Comments)     Hallucinations, spasticity, hyper-relfexive    Amlodipine Rash    Face swell   Oxycodone Nausea And Vomiting and Rash   Prednisone Rash and Other (See Comments)    Prescribing MD told patient he thinks dose (dose pack) was "just too high." 03/09/11   STATES HAS TAKEN SMALLER DOSE AND STILL HAD RASH AND JITTERINESS   Ibuprofen Swelling   Percocet [Oxycodone-Acetaminophen] Nausea And Vomiting   Trokendi Xr [Topiramate Er] Other (See Comments)    Made headache worse, dizziness, difficulty breathing, rash   Betadine [Povidone Iodine] Rash   Latex Dermatitis and Rash   Shellfish Allergy Swelling     Outpatient Medications Prior to Visit  Medication Sig Dispense Refill   albuterol (VENTOLIN HFA) 108 (90 Base) MCG/ACT inhaler 2 puffs as needed     cetirizine (ZYRTEC) 10 MG tablet Take by mouth.     Cyanocobalamin (VITAMIN B12) 1000 MCG TBCR 1 tablet     DILANTIN 100 MG ER capsule TAKE 1 CAPSULE(100 MG) BY MOUTH THREE TIMES DAILY 270 capsule 3   DILANTIN 30 MG ER capsule TAKE 1 CAPSULE(30 MG) BY MOUTH DAILY 90 capsule 1   diltiazem (CARDIZEM) 30 MG tablet 1 tablet     EPINEPHrine 0.3 mg/0.3 mL IJ SOAJ injection See admin instructions.     levETIRAcetam (KEPPRA) 500 MG tablet TAKE 1 TABLET BY MOUTH EVERY MORNING AND 2 TABLETS IN THE EVENING 270 tablet 3   meclizine (ANTIVERT) 25 MG tablet Take by mouth.     metoprolol succinate (TOPROL-XL) 100 MG 24 hr tablet Take 1 tablet (100 mg total) by mouth daily. Take with or immediately following a meal. 90 tablet 3   Misc. Devices MISC Please provide patient with right breast prosthesis and 6 mastectomy bras sue to a history of right breast cancer and mastectomy. (ICD10- C50.111) 6 each 0   pantoprazole (PROTONIX) 20 MG tablet 1 tablet     sodium chloride (OCEAN) 0.65 % nasal spray Place into the nose.     Vitamin D, Ergocalciferol, (DRISDOL) 1.25 MG (50000 UT) CAPS capsule Take 50,000 Units by mouth every 7 (seven) days.      aspirin-acetaminophen-caffeine (EXCEDRIN MIGRAINE) 250-250-65 MG tablet Take 1 tablet by mouth as needed. Pain (Patient not taking: Reported on 03/16/2021)     No facility-administered medications prior to visit.    Review of Systems  Constitutional:  Negative for chills, fever, malaise/fatigue and weight loss.  HENT:  Negative for hearing loss, sore throat and tinnitus.   Eyes:  Negative for blurred vision and double vision.    Respiratory:  Positive for cough and shortness of breath. Negative for hemoptysis, sputum production, wheezing and stridor.   Cardiovascular:  Negative for chest pain, palpitations, orthopnea, leg swelling and PND.  Gastrointestinal:  Negative for abdominal pain, constipation, diarrhea, heartburn, nausea and vomiting.  Genitourinary:  Negative for dysuria, hematuria and urgency.  Musculoskeletal:  Negative for joint pain and myalgias.  Skin:  Negative for itching and rash.  Neurological:  Negative for dizziness, tingling, weakness and headaches.  Endo/Heme/Allergies:  Negative for environmental allergies. Does not bruise/bleed easily.  Psychiatric/Behavioral:  Negative for depression. The patient is not nervous/anxious and does not have insomnia.   All other systems reviewed and are negative.   Objective:  Physical Exam Vitals reviewed.  Constitutional:      General: She is not in acute distress.    Appearance: She is well-developed.  HENT:     Head: Normocephalic and atraumatic.  Eyes:     General: No scleral icterus.    Conjunctiva/sclera: Conjunctivae normal.     Pupils: Pupils are equal, round, and reactive to light.  Neck:     Vascular: No JVD.     Trachea: No tracheal deviation.  Cardiovascular:     Rate and Rhythm: Normal rate and regular rhythm.     Heart sounds: Normal heart sounds. No murmur heard. Pulmonary:     Effort: Pulmonary effort is normal. No tachypnea, accessory muscle usage or respiratory distress.     Breath sounds: No stridor. No  wheezing, rhonchi or rales.  Abdominal:     General: Bowel sounds are normal. There is no distension.     Palpations: Abdomen is soft.     Tenderness: There is no abdominal tenderness.  Musculoskeletal:        General: No tenderness.     Cervical back: Neck supple.  Lymphadenopathy:     Cervical: No cervical adenopathy.  Skin:    General: Skin is warm and dry.     Capillary Refill: Capillary refill takes less than 2 seconds.     Findings: No rash.  Neurological:     Mental Status: She is alert and oriented to person, place, and time.  Psychiatric:        Behavior: Behavior normal.     Vitals:   03/24/21 1355  BP: 124/74  Pulse: 73  Temp: 98.4 F (36.9 C)  TempSrc: Oral  SpO2: 97%  Weight: 132 lb 9.6 oz (60.1 kg)  Height: 5' 2" (1.575 m)   97% on RA BMI Readings from Last 3 Encounters:  03/24/21 24.25 kg/m  03/16/21 24.00 kg/m  02/01/21 24.23 kg/m   Wt Readings from Last 3 Encounters:  03/24/21 132 lb 9.6 oz (60.1 kg)  03/16/21 131 lb 3.2 oz (59.5 kg)  02/01/21 132 lb 7.9 oz (60.1 kg)     CBC    Component Value Date/Time   WBC 7.7 12/02/2020 1210   RBC 5.39 (H) 12/02/2020 1210   HGB 17.2 (H) 12/02/2020 1210   HGB 16.5 (H) 03/02/2020 0917   HGB 16.4 (H) 12/28/2009 0941   HCT 49.4 (H) 12/02/2020 1210   HCT 47.3 (H) 03/02/2020 0917   HCT 47.1 (H) 12/28/2009 0941   PLT 166 12/02/2020 1210   PLT 160 03/02/2020 0917   MCV 91.7 12/02/2020 1210   MCV 90 03/02/2020 0917   MCV 92.8 12/28/2009 0941   MCH 31.9 12/02/2020 1210   MCHC 34.8 12/02/2020 1210   RDW 13.1 12/02/2020 1210   RDW 12.5 03/02/2020 0917     RDW 12.8 12/28/2009 0941   LYMPHSABS 3.4 12/02/2020 1210   LYMPHSABS 3.7 (H) 03/02/2020 0917   LYMPHSABS 2.0 12/28/2009 0941   MONOABS 0.6 12/02/2020 1210   MONOABS 0.6 12/28/2009 0941   EOSABS 0.1 12/02/2020 1210   EOSABS 0.2 03/02/2020 0917   BASOSABS 0.1 12/02/2020 1210   BASOSABS 0.1 03/02/2020 0917   BASOSABS 0.1 12/28/2009 0941    Chest  Imaging: 03/08/2021 CT chest: 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 The patient's images have been independently reviewed by me.    Pulmonary Functions Testing Results: No flowsheet data found.  FeNO:   Pathology:   Echocardiogram:   Heart Catheterization:     Assessment & Plan:     ICD-10-CM   1. Endobronchial mass  R91.8 Ambulatory referral to Pulmonology    Procedural/ Surgical Case Request: VIDEO BRONCHOSCOPY WITHOUT FLUORO    2. Current smoker  F17.200     3. Abnormal CT lung screening  R91.8     4. History of breast cancer  Z85.3       Discussion:  This is a 60-year-old female with a history of breast cancer.  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  Patient was referred by medical oncology for evaluation via bronchoscopy.  I also discussed today the possibility of proceeding with PET scan but even if the PET scan was equivocal I explained that she would likely need visualization of the airway.  Plan: We will plan to take patient for video bronchoscopy. We test gust the risk benefits and alternatives of proceeding with bronchoscopy. We also talked about the risk of bleeding if in fact there was a lesion identified within the airway of the inferior segment of the lingula that would need biopsy. Patient is agreeable to proceed with bronchoscopy. We will tentatively schedule for next week sometime in the afternoon at [facility].  Patient is agreeable to to this plan. We appreciate the consultation.   Current Outpatient Medications:    albuterol (VENTOLIN HFA) 108 (90 Base) MCG/ACT inhaler, 2 puffs as needed, Disp: , Rfl:    cetirizine (ZYRTEC) 10 MG tablet, Take by mouth., Disp: , Rfl:    Cyanocobalamin (VITAMIN B12) 1000 MCG TBCR, 1 tablet, Disp: , Rfl:    DILANTIN 100 MG ER capsule, TAKE 1 CAPSULE(100 MG) BY MOUTH THREE TIMES DAILY, Disp: 270 capsule, Rfl: 3   DILANTIN 30 MG ER  capsule, TAKE 1 CAPSULE(30 MG) BY MOUTH DAILY, Disp: 90 capsule, Rfl: 1   diltiazem (CARDIZEM) 30 MG tablet, 1 tablet, Disp: , Rfl:    EPINEPHrine 0.3 mg/0.3 mL IJ SOAJ injection, See admin instructions., Disp: , Rfl:    levETIRAcetam (KEPPRA) 500 MG tablet, TAKE 1 TABLET BY MOUTH EVERY MORNING AND 2 TABLETS IN THE EVENING, Disp: 270 tablet, Rfl: 3   meclizine (ANTIVERT) 25 MG tablet, Take by mouth., Disp: , Rfl:    metoprolol succinate (TOPROL-XL) 100 MG 24 hr tablet, Take 1 tablet (100 mg total) by mouth daily. Take with or immediately following a meal., Disp: 90 tablet, Rfl: 3   Misc. Devices MISC, Please provide patient with right breast prosthesis and 6 mastectomy bras sue to a history of right breast cancer and mastectomy. (ICD10- C50.111), Disp: 6 each, Rfl: 0   pantoprazole (PROTONIX) 20 MG tablet, 1 tablet, Disp: , Rfl:    sodium chloride (OCEAN) 0.65 % nasal spray, Place into the nose., Disp: , Rfl:    Vitamin   D, Ergocalciferol, (DRISDOL) 1.25 MG (50000 UT) CAPS capsule, Take 50,000 Units by mouth every 7 (seven) days., Disp: , Rfl:    aspirin-acetaminophen-caffeine (EXCEDRIN MIGRAINE) 250-250-65 MG tablet, Take 1 tablet by mouth as needed. Pain (Patient not taking: Reported on 03/16/2021), Disp: , Rfl:   I spent 62 minutes dedicated to the care of this patient on the date of this encounter to include pre-visit review of records, face-to-face time with the patient discussing conditions above, post visit ordering of testing, clinical documentation with the electronic health record, making appropriate referrals as documented, and communicating necessary findings to members of the patients care team.   Wendy Rager L Reis Goga, DO [facility] 03/24/2021 2:31 PM    

## 2021-03-24 NOTE — Patient Instructions (Addendum)
Thank you for visiting Dr. Valeta Harms at [facility]. Today we recommend the following:  Orders Placed This Encounter  Procedures   Procedural/ Surgical Case Request: [place]   Ambulatory referral to Pulmonology   Tentative bronchoscopy in the afternoon next week.  Return in about 2 weeks (around 04/07/2021).    Please do your part to reduce the spread of COVID-19.

## 2021-03-25 NOTE — Telephone Encounter (Addendum)
[facility] has requested later time on 12/15.  Procedure has been moved to 2:15.  Scheduler did say anesthesia leaves at 3:00 and this includes clean up time.  I spoke to pt and made her aware.

## 2021-03-29 ENCOUNTER — Other Ambulatory Visit: Payer: Self-pay | Admitting: Pulmonary Disease

## 2021-03-29 LAB — SARS CORONAVIRUS 2 (TAT 6-24 HRS): SARS Coronavirus 2: NEGATIVE

## 2021-04-01 ENCOUNTER — Ambulatory Visit (HOSPITAL_COMMUNITY): Payer: Medicare Other | Admitting: Anesthesiology

## 2021-04-01 ENCOUNTER — Other Ambulatory Visit: Payer: Self-pay

## 2021-04-01 ENCOUNTER — Encounter (HOSPITAL_COMMUNITY)
Admission: RE | Disposition: A | Discharge status: Home / Self Care | Payer: Self-pay | Source: Home / Self Care | Attending: Pulmonary Disease

## 2021-04-01 ENCOUNTER — Encounter (HOSPITAL_COMMUNITY): Payer: Self-pay | Admitting: Pulmonary Disease

## 2021-04-01 ENCOUNTER — Ambulatory Visit (HOSPITAL_COMMUNITY)
Admission: RE | Admit: 2021-04-01 | Discharge: 2021-04-01 | Disposition: A | Discharge status: Home / Self Care | Payer: Medicare Other | Attending: Pulmonary Disease | Admitting: Pulmonary Disease

## 2021-04-01 DIAGNOSIS — T17500A Unspecified foreign body in bronchus causing asphyxiation, initial encounter: Secondary | ICD-10-CM

## 2021-04-01 DIAGNOSIS — J9819 Other pulmonary collapse: Secondary | ICD-10-CM | POA: Diagnosis not present

## 2021-04-01 DIAGNOSIS — X58XXXA Exposure to other specified factors, initial encounter: Secondary | ICD-10-CM | POA: Diagnosis not present

## 2021-04-01 DIAGNOSIS — T17890A Other foreign object in other parts of respiratory tract causing asphyxiation, initial encounter: Secondary | ICD-10-CM | POA: Insufficient documentation

## 2021-04-01 DIAGNOSIS — R918 Other nonspecific abnormal finding of lung field: Secondary | ICD-10-CM | POA: Diagnosis not present

## 2021-04-01 DIAGNOSIS — J9809 Other diseases of bronchus, not elsewhere classified: Secondary | ICD-10-CM | POA: Diagnosis not present

## 2021-04-01 DIAGNOSIS — K449 Diaphragmatic hernia without obstruction or gangrene: Secondary | ICD-10-CM | POA: Diagnosis not present

## 2021-04-01 DIAGNOSIS — Z9221 Personal history of antineoplastic chemotherapy: Secondary | ICD-10-CM | POA: Diagnosis not present

## 2021-04-01 DIAGNOSIS — G40909 Epilepsy, unspecified, not intractable, without status epilepticus: Secondary | ICD-10-CM | POA: Insufficient documentation

## 2021-04-01 DIAGNOSIS — I10 Essential (primary) hypertension: Secondary | ICD-10-CM | POA: Diagnosis not present

## 2021-04-01 DIAGNOSIS — Z79899 Other long term (current) drug therapy: Secondary | ICD-10-CM | POA: Insufficient documentation

## 2021-04-01 DIAGNOSIS — F17210 Nicotine dependence, cigarettes, uncomplicated: Secondary | ICD-10-CM | POA: Insufficient documentation

## 2021-04-01 DIAGNOSIS — Z853 Personal history of malignant neoplasm of breast: Secondary | ICD-10-CM | POA: Diagnosis not present

## 2021-04-01 DIAGNOSIS — F418 Other specified anxiety disorders: Secondary | ICD-10-CM | POA: Diagnosis not present

## 2021-04-01 HISTORY — PX: VIDEO BRONCHOSCOPY: SHX5072

## 2021-04-01 HISTORY — PX: BRONCHIAL WASHINGS: SHX5105

## 2021-04-01 SURGERY — VIDEO BRONCHOSCOPY WITHOUT FLUORO
Anesthesia: General | Laterality: Bilateral

## 2021-04-01 MED ORDER — MIDAZOLAM HCL 2 MG/2ML IJ SOLN
INTRAMUSCULAR | Status: DC | PRN
  Administered 2021-04-01: 2 mg via INTRAVENOUS

## 2021-04-01 MED ORDER — SODIUM CHLORIDE 0.9 % IV SOLN: INTRAVENOUS | Status: DC

## 2021-04-01 MED ORDER — SUGAMMADEX SODIUM 200 MG/2ML IV SOLN
INTRAVENOUS | Status: DC | PRN
  Administered 2021-04-01: 200 mg via INTRAVENOUS

## 2021-04-01 MED ORDER — ROCURONIUM BROMIDE 10 MG/ML (PF) SYRINGE
PREFILLED_SYRINGE | INTRAVENOUS | Status: DC | PRN
  Administered 2021-04-01: 40 mg via INTRAVENOUS

## 2021-04-01 MED ORDER — PROPOFOL 10 MG/ML IV BOLUS
INTRAVENOUS | Status: DC | PRN
  Administered 2021-04-01: 160 mg via INTRAVENOUS

## 2021-04-01 MED ORDER — LIDOCAINE HCL (CARDIAC) PF 100 MG/5ML IV SOSY
PREFILLED_SYRINGE | INTRAVENOUS | Status: DC | PRN
  Administered 2021-04-01: 60 mg via INTRAVENOUS

## 2021-04-01 MED ORDER — FENTANYL CITRATE (PF) 100 MCG/2ML IJ SOLN
INTRAMUSCULAR | Status: AC
  Filled 2021-04-01: qty 2

## 2021-04-01 MED ORDER — FENTANYL CITRATE (PF) 100 MCG/2ML IJ SOLN
INTRAMUSCULAR | Status: DC | PRN
  Administered 2021-04-01: 50 ug via INTRAVENOUS

## 2021-04-01 MED ORDER — MIDAZOLAM HCL 2 MG/2ML IJ SOLN
INTRAMUSCULAR | Status: AC
  Filled 2021-04-01: qty 2

## 2021-04-01 MED ORDER — ONDANSETRON HCL 4 MG/2ML IJ SOLN
INTRAMUSCULAR | Status: DC | PRN
  Administered 2021-04-01: 4 mg via INTRAVENOUS

## 2021-04-01 NOTE — Transfer of Care (Signed)
Immediate Anesthesia Transfer of Care Note  Patient: Wendy Lopez  Procedure(s) Performed: VIDEO BRONCHOSCOPY WITHOUT FLUORO (Bilateral) BRONCHIAL WASHINGS  Patient Location: Endoscopy Unit  Anesthesia Type:General  Level of Consciousness: awake, alert , oriented and patient cooperative  Airway & Oxygen Therapy: Patient Spontanous Breathing  Post-op Assessment: Report given to RN and Post -op Vital signs reviewed and stable  Post vital signs: Reviewed and stable  Last Vitals:  Vitals Value Taken Time  BP 161/79 04/01/21 1350  Temp    Pulse 71 04/01/21 1350  Resp 15 04/01/21 1350  SpO2 92 % 04/01/21 1350    Last Pain:  Vitals:   04/01/21 1202  TempSrc: Oral  PainSc: 6          Complications: No notable events documented.

## 2021-04-01 NOTE — Anesthesia Postprocedure Evaluation (Signed)
Anesthesia Post Note  Patient: Wendy Lopez  Procedure(s) Performed: VIDEO BRONCHOSCOPY WITHOUT FLUORO (Bilateral) BRONCHIAL WASHINGS     Anesthesia Type: General Anesthetic complications: no   No notable events documented.  Last Vitals:  Vitals:   04/01/21 1400 04/01/21 1410  BP:    Pulse: 77 80  Resp: 18 20  Temp:    SpO2: 91% 96%    Last Pain:  Vitals:   04/01/21 1348  TempSrc: Axillary  PainSc:                  Aniketh Huberty

## 2021-04-01 NOTE — Anesthesia Preprocedure Evaluation (Addendum)
Anesthesia Evaluation  Patient identified by MRN, date of birth, ID band Patient awake    Reviewed: Allergy & Precautions, H&P , NPO status , Patient's Chart, lab work & pertinent test results, reviewed documented beta blocker date and time   History of Anesthesia Complications (+) PONV and history of anesthetic complications  Airway Mallampati: I  TM Distance: >3 FB Neck ROM: full    Dental no notable dental hx. (+) Teeth Intact, Implants, Caps, Dental Advisory Given,    Pulmonary Current Smoker,    Pulmonary exam normal breath sounds clear to auscultation       Cardiovascular Exercise Tolerance: Good hypertension, Pt. on medications Normal cardiovascular exam+ dysrhythmias  Rhythm:regular Rate:Normal  Mitral valve prolapse 06/01/10 echo- EF>55% mild tricupsid regurgitation. There is mild Pulmonary Hypertension.. Right  ventricular systolic pressure iselevated at 30-40 mmHg. LV systolic function is normal.    Neuro/Psych Seizures -,  negative neurological ROS  negative psych ROS   GI/Hepatic Neg liver ROS, hiatal hernia,   Endo/Other  negative endocrine ROS  Renal/GU negative Renal ROS  negative genitourinary   Musculoskeletal  (+) Arthritis ,   Abdominal   Peds  Hematology negative hematology ROS (+)   Anesthesia Other Findings   Reproductive/Obstetrics negative OB ROS                            Anesthesia Physical  Anesthesia Plan  ASA: 3  Anesthesia Plan: General   Post-op Pain Management: Minimal or no pain anticipated   Induction: Intravenous  PONV Risk Score and Plan: 2 and Treatment may vary due to age or medical condition and Scopolamine patch - Pre-op  Airway Management Planned: Oral ETT  Additional Equipment: None  Intra-op Plan:   Post-operative Plan: Extubation in OR  Informed Consent: I have reviewed the patients History and Physical, chart, labs and discussed  the procedure including the risks, benefits and alternatives for the proposed anesthesia with the patient or authorized representative who has indicated his/her understanding and acceptance.     Dental Advisory Given  Plan Discussed with: CRNA and Anesthesiologist  Anesthesia Plan Comments: (  )        Anesthesia Quick Evaluation

## 2021-04-01 NOTE — Discharge Instructions (Signed)
Flexible Bronchoscopy, Care After This sheet gives you information about how to care for yourself after your test. Your doctor may also give you more specific instructions. If you have problems or questions, contact your doctor. Follow these instructions at home: Eating and drinking Do not eat or drink anything (not even water) for 2 hours after your test, or until your numbing medicine (local anesthetic) wears off. When your numbness is gone and your cough and gag reflexes have come back, you may: Eat only soft foods. Slowly drink liquids. The day after the test, go back to your normal diet. Driving Do not drive for 24 hours if you were given a medicine to help you relax (sedative). Do not drive or use heavy machinery while taking prescription pain medicine. General instructions  Take over-the-counter and prescription medicines only as told by your doctor. Return to your normal activities as told. Ask what activities are safe for you. Do not use any products that have nicotine or tobacco in them. This includes cigarettes and e-cigarettes. If you need help quitting, ask your doctor. Keep all follow-up visits as told by your doctor. This is important. It is very important if you had a tissue sample (biopsy) taken. Get help right away if: You have shortness of breath that gets worse. You get light-headed. You feel like you are going to pass out (faint). You have chest pain. You cough up: More than a little blood. More blood than before. Summary Do not eat or drink anything (not even water) for 2 hours after your test, or until your numbing medicine wears off. Do not use cigarettes. Do not use e-cigarettes. Get help right away if you have chest pain.  This information is not intended to replace advice given to you by your health care provider. Make sure you discuss any questions you have with your health care provider. Document Released: 01/30/2009 Document Revised: 03/17/2017 Document  Reviewed: 04/22/2016 Elsevier Patient Education  2020 [employer].

## 2021-04-01 NOTE — Anesthesia Procedure Notes (Signed)
Procedure Name: Intubation Date/Time: 04/01/2021 1:24 PM Performed by: Raenette Rover, CRNA Pre-anesthesia Checklist: Patient identified, Emergency Drugs available, Suction available and Patient being monitored Patient Re-evaluated:Patient Re-evaluated prior to induction Oxygen Delivery Method: Circle system utilized Preoxygenation: Pre-oxygenation with 100% oxygen Induction Type: IV induction Ventilation: Mask ventilation without difficulty Laryngoscope Size: Mac and 3 Grade View: Grade I Tube type: Oral Tube size: 8.0 mm Number of attempts: 1 Airway Equipment and Method: Stylet Placement Confirmation: ETT inserted through vocal cords under direct vision, positive ETCO2 and breath sounds checked- equal and bilateral Secured at: 24 cm Tube secured with: Tape Dental Injury: Teeth and Oropharynx as per pre-operative assessment

## 2021-04-01 NOTE — Interval H&P Note (Signed)
History and Physical Interval Note:  04/01/2021 1:14 PM  Wendy Lopez  has presented today for surgery, with the diagnosis of endobronchial lesion.  The various methods of treatment have been discussed with the patient and family. After consideration of risks, benefits and other options for treatment, the patient has consented to  Procedure(s) with comments: VIDEO BRONCHOSCOPY WITHOUT FLUORO (Bilateral) - Ultrathin bronchoscopy as a surgical intervention.  The patient's history has been reviewed, patient examined, no change in status, stable for surgery.  I have reviewed the patient's chart and labs.  Questions were answered to the patient's satisfaction.     [place]

## 2021-04-01 NOTE — Op Note (Signed)
Video Bronchoscopy Procedure Note  Date of Operation: 04/01/2021  Pre-op Diagnosis: Lingular collapse  Post-op Diagnosis: Inferior lingular collapse mucous plugging  Surgeon: Garner Nash, DO   Assistants: none  Anesthesia: General   Operation: Flexible video fiberoptic bronchoscopy and biopsies.  Estimated Blood Loss: 0 cc  Complications: none noted  Indications and History: Wendy Lopez is 60 y.o. with history of persistent lingular collapse.  Recommendation was to perform video fiberoptic bronchoscopy with biopsies. The risks, benefits, complications, treatment options and expected outcomes were discussed with the patient.  The possibilities of pneumothorax, pneumonia, reaction to medication, pulmonary aspiration, perforation of a viscus, bleeding, failure to diagnose a condition and creating a complication requiring transfusion or operation were discussed with the patient who freely signed the consent.    Description of Procedure: The patient was seen in the Preoperative Area, was examined and was deemed appropriate to proceed.  The patient was taken to [facility] long endoscopy, identified as Wendy Lopez and the procedure verified as Flexible Video Fiberoptic Bronchoscopy.  A Time Out was held and the above information confirmed.   Standard therapeutic bronchoscope was inserted into the airways and the airways were examined bilaterally there was thick layering mucus within the left lower lobe as well as a white mucous plug occluding of the inferior portion of the lingula.  This was suctioned free and clear therapeutically.  There was no visible endobronchial lesion.  A BAL was performed within the left lingula to be sent for cultures.  The right lung anatomy appeared normal there was no evidence of endobronchial lesion.  The standard therapeutic scope was exchanged for an ultrathin Olympus scope.  This was used to examine the lingula segments more distally.  There was nothing  that appeared to be abnormal within the mucosa.  The mucosa was slightly inflamed but there was no visible endobronchial lesion.  Samples: 1.  BAL from the lingula  Plans:  We will review the microbiology results with the patient when they become available.  Outpatient followup will be with Dr. Lucy Antigua, DO [place] [facility] 04/01/2021 1:34 PM

## 2021-04-04 LAB — CULTURE, RESPIRATORY W GRAM STAIN

## 2021-04-07 ENCOUNTER — Telehealth: Payer: Self-pay | Admitting: Pulmonary Disease

## 2021-04-07 MED ORDER — LEVOFLOXACIN 500 MG PO TABS: 500.0000 mg | ORAL_TABLET | Freq: Every day | ORAL | 0 refills | Status: AC

## 2021-04-07 NOTE — Telephone Encounter (Signed)
PCCM:  Please let patient know her culture results have grown streptococcus and I would like to treat her with antibiotics to help clear this up.   Start levaquin 500mg  X 7 days.   Orders placed   Please call and let patient know   Garner Nash, DO [facility] 04/07/2021 8:26 AM

## 2021-04-07 NOTE — Telephone Encounter (Signed)
Called and spoke with pt letting her know the culture results and that BI sent Rx for levaquin to the pharmacy for her. Pt verbalized understanding. Nothing further needed.

## 2021-04-08 LAB — ACID FAST SMEAR (AFB, MYCOBACTERIA): Acid Fast Smear: NEGATIVE

## 2021-04-14 ENCOUNTER — Ambulatory Visit (INDEPENDENT_AMBULATORY_CARE_PROVIDER_SITE_OTHER): Payer: Medicare Other | Admitting: Pulmonary Disease

## 2021-04-14 ENCOUNTER — Other Ambulatory Visit: Payer: Self-pay

## 2021-04-14 ENCOUNTER — Encounter: Payer: Self-pay | Admitting: Pulmonary Disease

## 2021-04-14 VITALS — BP 122/72 | HR 73 | Temp 98.2°F | Ht 62.0 in | Wt 129.2 lb

## 2021-04-14 DIAGNOSIS — Z716 Tobacco abuse counseling: Secondary | ICD-10-CM

## 2021-04-14 DIAGNOSIS — F17200 Nicotine dependence, unspecified, uncomplicated: Secondary | ICD-10-CM

## 2021-04-14 DIAGNOSIS — Z853 Personal history of malignant neoplasm of breast: Secondary | ICD-10-CM

## 2021-04-14 DIAGNOSIS — F17210 Nicotine dependence, cigarettes, uncomplicated: Secondary | ICD-10-CM | POA: Diagnosis not present

## 2021-04-14 DIAGNOSIS — A491 Streptococcal infection, unspecified site: Secondary | ICD-10-CM | POA: Diagnosis not present

## 2021-04-14 DIAGNOSIS — T17500A Unspecified foreign body in bronchus causing asphyxiation, initial encounter: Secondary | ICD-10-CM

## 2021-04-14 NOTE — Patient Instructions (Signed)
Thank you for visiting Dr. Valeta Harms at [facility]. Today we recommend the following:  Samples stiolto   Return in about 6 months (around 10/13/2021) for with APP or Dr. Valeta Harms.    Please do your part to reduce the spread of COVID-19.   You must quit smoking or vaping. This is the single most important thing that you can do to improve your lung health.   S = Set a quit date. T = Tell family, friends, and the people around you that you plan to quit. A = Anticipate or plan ahead for the tough times you'll face while quitting. R = Remove cigarettes and other tobacco products from your home, car, and work T = Talk to [place] about getting help to quit  If you need help feel free to reach out to our office, [place] [facility]: [phone], call 1-800-QUIT-NOW, or visit [email or web address]

## 2021-04-14 NOTE — Progress Notes (Signed)
Synopsis: Referred in December 2022 for lingular occlusion on CT by Wenda Low, MD  Subjective:   PATIENT ID: Wendy Lopez GENDER: female DOB: 10/12/1960, MRN: [id number]  Chief Complaint  Patient presents with   Follow-up    Follow up.     This is a 60 year old female, past medical history of breast cancer status postmastectomy and chemo, longstanding history of tobacco use.  She is current smoker.  She had a lung cancer screening CT in October 2022.  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  Patient was referred for evaluation after repeat CT imaging in November showed persistence of the occlusion in the left lung.  She rarely uses her inhaler.  And she denies hemoptysis.  OV 04/14/2021: here for follow up after bronchoscopy.  Patient's bronchoscopy revealed mucous plugging.  Culture results were positive for Streptococcus pneumonia.  Patient was treated with antibiotics.  Overall she is feeling much better and breathing better.  We also talked about the potential of use of inhalers.  We also talked about smoking cessation.  Please see separate documentation regarding smoking cessation.   Past Medical History:  Diagnosis Date   Anxiety and depression    Arthritis    Breast cancer ([place]) 02/13/2009   Cancer of right breast ([place]) 04/16/2012   Depression    Depression 2014   Dysrhythmia    palpitations   Epilepsy ([place])    last seizure 1990's   H/O hiatal hernia    Localization-related (focal) (partial) epilepsy and epileptic syndromes with complex partial seizures, without mention of intractable epilepsy 02/07/2013   Memory deficit 02/07/2013   Memory loss    Mitral valve prolapse 06/01/10   echo- EF>55% mild tricupsid regurgitation. There is mild Pulmonary Hypertension.. Right  ventricular systolic pressure iselevated at 30-40 mmHg. LV systolic function is normal.   Osteopenia    PONV (postoperative nausea and vomiting)    SOB (shortness  of breath) 10/18/11   met-test   Spinal fusion failure    3     Family History  Problem Relation Age of Onset   Arthritis Mother        rhuematoid   Heart disease Mother        Atrial fib   Heart disease Father    Heart disease Sister        Atrial fib     Past Surgical History:  Procedure Laterality Date   BACK SURGERY     screws- lumbar   BREAST SURGERY  2010   mastectomy - RIGHT   BRONCHIAL WASHINGS  04/01/2021   Procedure: BRONCHIAL WASHINGS;  Surgeon: Garner Nash, DO;  Location: [facility];  Service: Cardiopulmonary;;   COLONOSCOPY WITH PROPOFOL  03/13/2012   Procedure: COLONOSCOPY WITH PROPOFOL;  Surgeon: Garlan Fair, MD;  Location: [facility];  Service: Endoscopy;  Laterality: N/A;   ESOPHAGOGASTRODUODENOSCOPY  03/13/2012   Procedure: ESOPHAGOGASTRODUODENOSCOPY (EGD);  Surgeon: Garlan Fair, MD;  Location: [facility];  Service: Endoscopy;  Laterality: N/A;  Reflux, Hiatal Hernia   HAND TENDON SURGERY  2012   tendons released in right hand    MASTECTOMY, RADICAL  02/13/2009   SPINE SURGERY  1999, 2003, 2005   TONSILLECTOMY     VIDEO BRONCHOSCOPY Bilateral 04/01/2021   Procedure: VIDEO BRONCHOSCOPY WITHOUT FLUORO;  Surgeon: Garner Nash, DO;  Location: [facility];  Service: Cardiopulmonary;  Laterality: Bilateral;  Ultrathin bronchoscopy    Social History   Socioeconomic  History   Marital status: Married    Spouse name: Not on file   Number of children: 1   Years of education: college 2   Highest education level: Not on file  Occupational History   Occupation: unemployed    Employer: UNEMPLOYED    Comment: 2003  Tobacco Use   Smoking status: Every Day    Packs/day: 1.00    Years: 43.00    Pack years: 43.00    Types: Cigarettes   Smokeless tobacco: Never   Tobacco comments:    1/2 pk per day  Substance and Sexual Activity   Alcohol use: No    Alcohol/week: 0.0 standard drinks   Drug use: No   Sexual activity: Not on file   Other Topics Concern   Not on file  Social History Narrative   Patient lives at home with husband   Patient is right handed.   Patient does not drink caffeine.   Social Determinants of Health   Financial Resource Strain: Not on file  Food Insecurity: Not on file  Transportation Needs: Not on file  Physical Activity: Not on file  Stress: Not on file  Social Connections: Not on file  Intimate Partner Violence: Not on file     Allergies  Allergen Reactions   Carbamazepine Anaphylaxis   Lamictal [Lamotrigine] Anaphylaxis    SOB, Lamotrigine only   Other     STATES HAS SHORTNESS OF BREATH WITH GENERIC DRUGS     ALSO RASH AND ITCHING   Prednisone Shortness Of Breath, Rash and Other (See Comments)   Pseudoephedrine Hcl Er Other (See Comments)    SEIZURES.   Valium Other (See Comments)    Hallucinations, spasticity, hyper-relfexive    Amlodipine Rash    Face swell   Oxycodone Nausea And Vomiting and Rash   Ibuprofen Swelling   Percocet [Oxycodone-Acetaminophen] Nausea And Vomiting   Trokendi Kellogg Er] Other (See Comments)    Made headache worse, dizziness, difficulty breathing, rash   Betadine [Povidone Iodine] Rash   Latex Dermatitis and Rash   Shellfish Allergy Swelling     Outpatient Medications Prior to Visit  Medication Sig Dispense Refill   albuterol (VENTOLIN HFA) 108 (90 Base) MCG/ACT inhaler 2 puffs every 6 (six) hours as needed for wheezing or shortness of breath.     aspirin-acetaminophen-caffeine (EXCEDRIN MIGRAINE) 250-250-65 MG tablet Take 1 tablet by mouth every 8 (eight) hours as needed for headache.     Cyanocobalamin (VITAMIN B12) 1000 MCG TBCR Take 1,000 mcg by mouth in the morning.     DILANTIN 100 MG ER capsule TAKE 1 CAPSULE(100 MG) BY MOUTH THREE TIMES DAILY 270 capsule 3   DILANTIN 30 MG ER capsule TAKE 1 CAPSULE(30 MG) BY MOUTH DAILY (Patient taking differently: 30 mg at bedtime.) 90 capsule 1   diltiazem (CARDIZEM) 30 MG tablet Take 30 mg  by mouth 3 (three) times daily as needed (heart palpitations/increased heart rates).     EPINEPHrine 0.3 mg/0.3 mL IJ SOAJ injection Inject 0.3 mg into the muscle as needed for anaphylaxis.     levETIRAcetam (KEPPRA) 500 MG tablet TAKE 1 TABLET BY MOUTH EVERY MORNING AND 2 TABLETS IN THE EVENING 270 tablet 3   levofloxacin (LEVAQUIN) 500 MG tablet Take 1 tablet (500 mg total) by mouth daily for 7 days. 7 tablet 0   meclizine (ANTIVERT) 25 MG tablet Take 25 mg by mouth 3 (three) times daily as needed for dizziness.     metoprolol succinate (TOPROL-XL)  100 MG 24 hr tablet Take 1 tablet (100 mg total) by mouth daily. Take with or immediately following a meal. 90 tablet 3   Misc. Devices MISC Please provide patient with right breast prosthesis and 6 mastectomy bras sue to a history of right breast cancer and mastectomy. (ICD10- C50.111) 6 each 0   sodium chloride (OCEAN) 0.65 % nasal spray Place 1-2 sprays into the nose as needed for congestion.     Vitamin D, Ergocalciferol, (DRISDOL) 1.25 MG (50000 UT) CAPS capsule Take 50,000 Units by mouth every Tuesday.     ZYRTEC ALLERGY 10 MG tablet Take 10 mg by mouth at bedtime.     No facility-administered medications prior to visit.    Review of Systems  Constitutional:  Negative for chills, fever, malaise/fatigue and weight loss.  HENT:  Negative for hearing loss, sore throat and tinnitus.   Eyes:  Negative for blurred vision and double vision.  Respiratory:  Positive for cough and shortness of breath. Negative for hemoptysis, sputum production, wheezing and stridor.   Cardiovascular:  Negative for chest pain, palpitations, orthopnea, leg swelling and PND.  Gastrointestinal:  Negative for abdominal pain, constipation, diarrhea, heartburn, nausea and vomiting.  Genitourinary:  Negative for dysuria, hematuria and urgency.  Musculoskeletal:  Negative for joint pain and myalgias.  Skin:  Negative for itching and rash.  Neurological:  Negative for  dizziness, tingling, weakness and headaches.  Endo/Heme/Allergies:  Negative for environmental allergies. Does not bruise/bleed easily.  Psychiatric/Behavioral:  Negative for depression. The patient is not nervous/anxious and does not have insomnia.   All other systems reviewed and are negative.   Objective:  Physical Exam Vitals reviewed.  Constitutional:      General: She is not in acute distress.    Appearance: She is well-developed.     Comments: Thin  HENT:     Head: Normocephalic and atraumatic.  Eyes:     General: No scleral icterus.    Conjunctiva/sclera: Conjunctivae normal.     Pupils: Pupils are equal, round, and reactive to light.  Neck:     Vascular: No JVD.     Trachea: No tracheal deviation.  Cardiovascular:     Rate and Rhythm: Normal rate and regular rhythm.     Heart sounds: Normal heart sounds. No murmur heard. Pulmonary:     Effort: Pulmonary effort is normal. No tachypnea, accessory muscle usage or respiratory distress.     Breath sounds: No stridor. No wheezing, rhonchi or rales.     Comments: Diminished breath sounds bilaterally Abdominal:     General: There is no distension.     Palpations: Abdomen is soft.     Tenderness: There is no abdominal tenderness.  Musculoskeletal:        General: No tenderness.     Cervical back: Neck supple.  Lymphadenopathy:     Cervical: No cervical adenopathy.  Skin:    General: Skin is warm and dry.     Capillary Refill: Capillary refill takes less than 2 seconds.     Findings: No rash.  Neurological:     Mental Status: She is alert and oriented to person, place, and time.  Psychiatric:        Behavior: Behavior normal.     Vitals:   04/14/21 1619  BP: 122/72  Pulse: 73  Temp: 98.2 F (36.8 C)  TempSrc: Oral  SpO2: 98%  Weight: 129 lb 3.2 oz (58.6 kg)  Height: 5' 2"  (1.575 m)   98% on RA  BMI Readings from Last 3 Encounters:  04/14/21 23.63 kg/m  04/01/21 24.14 kg/m  03/24/21 24.25 kg/m   Wt  Readings from Last 3 Encounters:  04/14/21 129 lb 3.2 oz (58.6 kg)  04/01/21 132 lb (59.9 kg)  03/24/21 132 lb 9.6 oz (60.1 kg)     CBC    Component Value Date/Time   WBC 7.7 12/02/2020 1210   RBC 5.39 (H) 12/02/2020 1210   HGB 17.2 (H) 12/02/2020 1210   HGB 16.5 (H) 03/02/2020 0917   HGB 16.4 (H) 12/28/2009 0941   HCT 49.4 (H) 12/02/2020 1210   HCT 47.3 (H) 03/02/2020 0917   HCT 47.1 (H) 12/28/2009 0941   PLT 166 12/02/2020 1210   PLT 160 03/02/2020 0917   MCV 91.7 12/02/2020 1210   MCV 90 03/02/2020 0917   MCV 92.8 12/28/2009 0941   MCH 31.9 12/02/2020 1210   MCHC 34.8 12/02/2020 1210   RDW 13.1 12/02/2020 1210   RDW 12.5 03/02/2020 0917   RDW 12.8 12/28/2009 0941   LYMPHSABS 3.4 12/02/2020 1210   LYMPHSABS 3.7 (H) 03/02/2020 0917   LYMPHSABS 2.0 12/28/2009 0941   MONOABS 0.6 12/02/2020 1210   MONOABS 0.6 12/28/2009 0941   EOSABS 0.1 12/02/2020 1210   EOSABS 0.2 03/02/2020 0917   BASOSABS 0.1 12/02/2020 1210   BASOSABS 0.1 03/02/2020 0917   BASOSABS 0.1 12/28/2009 0941    Chest Imaging: 03/08/2021 CT chest: 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 The patient's images have been independently reviewed by me.    Pulmonary Functions Testing Results: No flowsheet data found.  FeNO:   Pathology:   Echocardiogram:   Heart Catheterization:     Assessment & Plan:     ICD-10-CM   1. Mucus plugging of bronchi  T17.500A Ambulatory Referral for Lung Cancer Scre    2. Current smoker  F17.200 Ambulatory Referral for Lung Cancer Scre    3. History of breast cancer  Z85.3     4. Encounter for smoking cessation counseling  Z71.6     5. Streptococcus pneumoniae  A49.1       Discussion:  This is a 60 year old female, history of breast cancer, CT imaging with inferior segmental lingular occlusion found to have mucous plugging.  Patient was taken for bronchoscopy bronchoscopy cultures were positive for Streptococcus pneumonia  and patient has completed course of antibiotics.  Patient was also counseled on smoking cessation and likely has an underlying diagnosis of COPD no PFTs on file.  Plan: Trial of inhaler. Samples of Stiolto. Continue albuterol as needed for shortness of breath and wheezing. She likely needs refills for albuterol. Please see separate documentation regarding smoking cessation counseling. Continue flutter valve to help maintain airway clearance that and prevent mucus plugging.   Current Outpatient Medications:    albuterol (VENTOLIN HFA) 108 (90 Base) MCG/ACT inhaler, 2 puffs every 6 (six) hours as needed for wheezing or shortness of breath., Disp: , Rfl:    aspirin-acetaminophen-caffeine (EXCEDRIN MIGRAINE) 250-250-65 MG tablet, Take 1 tablet by mouth every 8 (eight) hours as needed for headache., Disp: , Rfl:    Cyanocobalamin (VITAMIN B12) 1000 MCG TBCR, Take 1,000 mcg by mouth in the morning., Disp: , Rfl:    DILANTIN 100 MG ER capsule, TAKE 1 CAPSULE(100 MG) BY MOUTH THREE TIMES DAILY, Disp: 270 capsule, Rfl: 3   DILANTIN 30 MG ER capsule, TAKE 1 CAPSULE(30 MG) BY MOUTH DAILY (Patient taking differently: 30 mg at bedtime.), Disp: 90 capsule, Rfl: 1  diltiazem (CARDIZEM) 30 MG tablet, Take 30 mg by mouth 3 (three) times daily as needed (heart palpitations/increased heart rates)., Disp: , Rfl:    EPINEPHrine 0.3 mg/0.3 mL IJ SOAJ injection, Inject 0.3 mg into the muscle as needed for anaphylaxis., Disp: , Rfl:    levETIRAcetam (KEPPRA) 500 MG tablet, TAKE 1 TABLET BY MOUTH EVERY MORNING AND 2 TABLETS IN THE EVENING, Disp: 270 tablet, Rfl: 3   levofloxacin (LEVAQUIN) 500 MG tablet, Take 1 tablet (500 mg total) by mouth daily for 7 days., Disp: 7 tablet, Rfl: 0   meclizine (ANTIVERT) 25 MG tablet, Take 25 mg by mouth 3 (three) times daily as needed for dizziness., Disp: , Rfl:    metoprolol succinate (TOPROL-XL) 100 MG 24 hr tablet, Take 1 tablet (100 mg total) by mouth daily. Take with or  immediately following a meal., Disp: 90 tablet, Rfl: 3   Misc. Devices MISC, Please provide patient with right breast prosthesis and 6 mastectomy bras sue to a history of right breast cancer and mastectomy. (ICD10- C50.111), Disp: 6 each, Rfl: 0   sodium chloride (OCEAN) 0.65 % nasal spray, Place 1-2 sprays into the nose as needed for congestion., Disp: , Rfl:    Vitamin D, Ergocalciferol, (DRISDOL) 1.25 MG (50000 UT) CAPS capsule, Take 50,000 Units by mouth every Tuesday., Disp: , Rfl:    ZYRTEC ALLERGY 10 MG tablet, Take 10 mg by mouth at bedtime., Disp: , Rfl:     Garner Nash, DO [facility] 04/14/2021 5:53 PM

## 2021-04-14 NOTE — Progress Notes (Signed)
Smoking Cessation Counseling:   The patients current tobacco use: 0.5 ppd  The patient was advised to quit and impact of smoking on their health.  I assessed the patient's willingness to attempt to quit. I provided methods and skills for cessation. We reviewed medication management of smoking session drugs if appropriate. Resources to help quit smoking were provided. A smoking cessation quit date was set: in 10 weeks Discussed tapering method  Follow-up was arranged in our clinic.  The amount of time spent counseling patient was 4 mins    Garner Nash, DO [facility] 04/14/2021 4:38 PM

## 2021-04-22 DIAGNOSIS — D696 Thrombocytopenia, unspecified: Secondary | ICD-10-CM | POA: Diagnosis not present

## 2021-04-22 DIAGNOSIS — F17210 Nicotine dependence, cigarettes, uncomplicated: Secondary | ICD-10-CM | POA: Diagnosis not present

## 2021-04-22 DIAGNOSIS — I10 Essential (primary) hypertension: Secondary | ICD-10-CM | POA: Diagnosis not present

## 2021-04-22 DIAGNOSIS — R002 Palpitations: Secondary | ICD-10-CM | POA: Diagnosis not present

## 2021-04-22 DIAGNOSIS — G40909 Epilepsy, unspecified, not intractable, without status epilepticus: Secondary | ICD-10-CM | POA: Diagnosis not present

## 2021-04-22 DIAGNOSIS — F322 Major depressive disorder, single episode, severe without psychotic features: Secondary | ICD-10-CM | POA: Diagnosis not present

## 2021-04-22 DIAGNOSIS — J439 Emphysema, unspecified: Secondary | ICD-10-CM | POA: Diagnosis not present

## 2021-04-22 DIAGNOSIS — I700 Atherosclerosis of aorta: Secondary | ICD-10-CM | POA: Diagnosis not present

## 2021-05-06 LAB — FUNGUS CULTURE WITH STAIN

## 2021-05-06 LAB — FUNGAL ORGANISM REFLEX

## 2021-05-06 LAB — FUNGUS CULTURE RESULT

## 2021-05-22 LAB — ACID FAST CULTURE WITH REFLEXED SENSITIVITIES (MYCOBACTERIA): Acid Fast Culture: NEGATIVE

## 2021-06-03 ENCOUNTER — Telehealth: Payer: Self-pay

## 2021-06-03 ENCOUNTER — Other Ambulatory Visit: Payer: Self-pay

## 2021-06-03 MED ORDER — DILANTIN 30 MG PO CAPS: ORAL_CAPSULE | ORAL | 0 refills | Status: DC

## 2021-06-03 NOTE — Telephone Encounter (Signed)
I called pt and left vm asking for CB for f/u appt to be scheduled with either SS, NP or Dr. April Manson for f/u on seizures.   Pt is due and was previously seen by Dr. Jannifer Franklin.  90 day supply was sent in interim for the Dilantin 30 mg until pt can be seen.   This was discussed with SS, NP as well.

## 2021-06-08 ENCOUNTER — Inpatient Hospital Stay (HOSPITAL_COMMUNITY): Payer: Medicare Other | Attending: Hematology

## 2021-06-08 DIAGNOSIS — Z170 Estrogen receptor positive status [ER+]: Secondary | ICD-10-CM

## 2021-06-08 DIAGNOSIS — Z87891 Personal history of nicotine dependence: Secondary | ICD-10-CM | POA: Insufficient documentation

## 2021-06-08 DIAGNOSIS — Z853 Personal history of malignant neoplasm of breast: Secondary | ICD-10-CM | POA: Diagnosis not present

## 2021-06-08 DIAGNOSIS — C50111 Malignant neoplasm of central portion of right female breast: Secondary | ICD-10-CM

## 2021-06-08 LAB — CBC WITH DIFFERENTIAL/PLATELET
Abs Immature Granulocytes: 0.01 10*3/uL (ref 0.00–0.07)
Basophils Absolute: 0.1 10*3/uL (ref 0.0–0.1)
Basophils Relative: 2 %
Eosinophils Absolute: 0.1 10*3/uL (ref 0.0–0.5)
Eosinophils Relative: 1 %
HCT: 48.4 % — ABNORMAL HIGH (ref 36.0–46.0)
Hemoglobin: 16.4 g/dL — ABNORMAL HIGH (ref 12.0–15.0)
Immature Granulocytes: 0 %
Lymphocytes Relative: 44 %
Lymphs Abs: 3.1 10*3/uL (ref 0.7–4.0)
MCH: 30.5 pg (ref 26.0–34.0)
MCHC: 33.9 g/dL (ref 30.0–36.0)
MCV: 90 fL (ref 80.0–100.0)
Monocytes Absolute: 0.6 10*3/uL (ref 0.1–1.0)
Monocytes Relative: 9 %
Neutro Abs: 3.1 10*3/uL (ref 1.7–7.7)
Neutrophils Relative %: 44 %
Platelets: 148 10*3/uL — ABNORMAL LOW (ref 150–400)
RBC: 5.38 MIL/uL — ABNORMAL HIGH (ref 3.87–5.11)
RDW: 12.9 % (ref 11.5–15.5)
WBC: 7 10*3/uL (ref 4.0–10.5)
nRBC: 0 % (ref 0.0–0.2)

## 2021-06-09 ENCOUNTER — Ambulatory Visit (INDEPENDENT_AMBULATORY_CARE_PROVIDER_SITE_OTHER): Payer: Medicare Other | Admitting: Neurology

## 2021-06-09 ENCOUNTER — Encounter: Payer: Self-pay | Admitting: Neurology

## 2021-06-09 VITALS — BP 106/66 | HR 95 | Ht 62.0 in | Wt 131.0 lb

## 2021-06-09 DIAGNOSIS — R569 Unspecified convulsions: Secondary | ICD-10-CM

## 2021-06-09 DIAGNOSIS — R42 Dizziness and giddiness: Secondary | ICD-10-CM

## 2021-06-09 DIAGNOSIS — E538 Deficiency of other specified B group vitamins: Secondary | ICD-10-CM | POA: Diagnosis not present

## 2021-06-09 DIAGNOSIS — G40109 Localization-related (focal) (partial) symptomatic epilepsy and epileptic syndromes with simple partial seizures, not intractable, without status epilepticus: Secondary | ICD-10-CM

## 2021-06-09 MED ORDER — BRIVIACT 50 MG PO TABS: 50.0000 mg | ORAL_TABLET | Freq: Two times a day (BID) | ORAL | 11 refills | Status: AC

## 2021-06-09 NOTE — Patient Instructions (Addendum)
Start Briviact 50 mg twice daily, Samples given to patient.  Discontinue Keppra due to adverse effects Continue with Dilantin, last level was within normal limits.  Advised patient to contact me if she has any adverse effects from the Briviact Continue your other medication  Routine EEG  Follow-up in 6 months or sooner if worse

## 2021-06-09 NOTE — Progress Notes (Signed)
PATIENT: Wendy Lopez DOB: [birth year]  REASON FOR VISIT: follow up HISTORY FROM: patient Primary Neurologist: Dr. Jannifer Franklin  Today 06/09/21 Patient present today for follow-up, last visit was in July.  At that time she reported doing well on Keppra and Dilantin, no seizures.  She reported in January she was diagnosed with infection and was put on Levaquin, while on Levaquin she had 2 of her typical seizure with the aura, no convulsions.  Patient report with the [place] she had personality changes, she cannot focus, cannot concentrate she is irritable and would like to switch medication.  Discussed about other type of medication but she has allergy to the failure on general medication.  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   INTERVAL HISTORY 10/26/2020 Wendy Lopez is a 61 year old female with history of seizures and migraine headaches. Had dizziness last year, MRI of the brain showed few white matter lesions, nonspecific, could be related to migraine history. Requires brand name medications, highly allergic to filler in generic medications, on Dilantin, but generic Keppra. Took brand name Trokendi for headaches, reports facial swelling, breaking out, tremors, stopped it. Had headaches up until March, gradually continues to improve. Last week had migraine, has vomiting with migraines, doesn't want more medications due to high sensitivity. Had Dilantin level checked 10/20/20, Dilantin level 8.4. Claims pre-seizure feeling last week, foggy, sensation/squeezing in neck and chest. 4 grand mal seizures in her life, most have been partial. Wonders if brand name medication Dilantin has been changed to account for recent feeling. Here today alone.   Reviewed blood work from PCP collected October 20, 2020: Dilantin level was 8.4, Keppra level was 23.7, CMP was normal, CBC showed slightly elevated HCT 47.2, platelet level mildly low 136, TSH 1.08.  Update  03/02/2020 SS: Wendy Lopez is a 61 year old female with history of seizures, last seizure was in August 2020, she had awakened and had bitten her tongue, her Dilantin was increased.  She is on Dilantin and Keppra.  She is highly allergic to, filler in generic medications, but is tolerating generic Keppra, has found a specific manufacturer (Lupen) works well for her.  Today, reports return of her migraines, that she was experiencing her 30s, also dizziness (floaty sensation), hard to say exactly when it started, but probably around May.  Headache is bilateral, pounding, photophobia, phonophobia, nausea, occasional vomiting.  Reports daily headache, usually not in the morning, could be anytime throughout the day.  Unable to identify triggers.  Headache and dizziness are related.  Taking daily Excedrin Migraine.  Usually dizziness is brought on by movement on the screen (TV/phone), pending appointment with ophthalmology.  Has seen PCP, CT head in October was unremarkable.  Since June, noted decreased sensation/numbness to her right lower face, no facial droop or slurred speech.  Has had several falls related to dizziness.  No reported changes in medications.  Has seen cardiology, heart monitor is pending.  Had Covid vaccine 2nd dose May 10th, that night, had fever 103, shaking, had dj vu spell, which can be associated with seizure.  Had a tremor in both hands for 8 weeks, that went away.  Presents today for evaluation unaccompanied.  HISTORY 06/05/2019 SS: Wendy Lopez is a 61 year old female with history of seizures.  When last seen, she had likely seizure episode, when she had awakened and had bitten her tongue.  Her Dilantin level was increased with a 30 mg capsule, she remains on Keppra.  She indicates since increasing her dose of Dilantin, she is actually feeling a lot better, has been having more energy, she is very pleased.  She is taking generic Keppra, has a Licensed conveyancer (Lupen) that works for  her.  She says she is highly allergic to a common filler in generic medications.  Her allergy has been so significant, at times has had to use EpiPen.  She has not had recurrent seizure.  She is working with pharmacy, unsure, how much more medication she will be able to receive from this manufacturer.  In the past, she has been on brand-name Keppra, but insurance did not pay.  She presents today for follow-up unaccompanied.   REVIEW OF SYSTEMS: Out of a complete 14 system review of symptoms, the patient complains only of the following symptoms, and all other reviewed systems are negative.  See HPI  ALLERGIES: Allergies  Allergen Reactions   Carbamazepine Anaphylaxis   Lamictal [Lamotrigine] Anaphylaxis    SOB, Lamotrigine only   Other     STATES HAS SHORTNESS OF BREATH WITH GENERIC DRUGS     ALSO RASH AND ITCHING   Prednisone Shortness Of Breath, Rash and Other (See Comments)   Pseudoephedrine Hcl Er Other (See Comments)    SEIZURES.   Valium Other (See Comments)    Hallucinations, spasticity, hyper-relfexive    Amlodipine Rash    Face swell   Oxycodone Nausea And Vomiting and Rash   Ibuprofen Swelling   Percocet [Oxycodone-Acetaminophen] Nausea And Vomiting   Trokendi Kell[place] Er] Other (See Comments)    Made headache worse, dizziness, difficulty breathing, rash   Betadine [Povidone Iodine] Rash   Latex Dermatitis and Rash   Shellfish Allergy Swelling    HOME MEDICATIONS: Outpatient Medications Prior to Visit  Medication Sig Dispense Refill   albuterol (VENTOLIN HFA) 108 (90 Base) MCG/ACT inhaler 2 puffs every 6 (six) hours as needed for wheezing or shortness of breath.     aspirin-acetaminophen-caffeine (EXCEDRIN MIGRAINE) 250-250-65 MG tablet Take 1 tablet by mouth every 8 (eight) hours as needed for headache.     Cyanocobalamin (VITAMIN B12) 1000 MCG TBCR Take 1,000 mcg by mouth in the morning.     DILANTIN 100 MG ER capsule TAKE 1 CAPSULE(100 MG) BY MOUTH THREE TIMES  DAILY 270 capsule 3   DILANTIN 30 MG ER capsule TAKE 1 CAPSULE(30 MG) BY MOUTH DAILY 90 capsule 0   diltiazem (CARDIZEM) 30 MG tablet Take 30 mg by mouth 3 (three) times daily as needed (heart palpitations/increased heart rates).     EPINEPHrine 0.3 mg/0.3 mL IJ SOAJ injection Inject 0.3 mg into the muscle as needed for anaphylaxis.     meclizine (ANTIVERT) 25 MG tablet Take 25 mg by mouth 3 (three) times daily as needed for dizziness.     metoprolol succinate (TOPROL-XL) 100 MG 24 hr tablet Take 1 tablet (100 mg total) by mouth daily. Take with or immediately following a meal. 90 tablet 3   Misc. Devices MISC Please provide patient with right breast prosthesis and 6 mastectomy bras sue to a history of right breast cancer and mastectomy. (ICD10- C50.111) 6 each 0   sodium chloride (OCEAN) 0.65 % nasal spray Place 1-2 sprays into the nose as needed for congestion.     Vitamin D, Ergocalciferol, (DRISDOL) 1.25 MG (50000 UT) CAPS capsule Take 50,000 Units by mouth every Tuesday.     ZYRTEC ALLERGY 10 MG tablet Take 10 mg by mouth at bedtime.     levETIRAcetam (  KEPPRA) 500 MG tablet TAKE 1 TABLET BY MOUTH EVERY MORNING AND 2 TABLETS IN THE EVENING 270 tablet 3   No facility-administered medications prior to visit.    PAST MEDICAL HISTORY: Past Medical History:  Diagnosis Date   Anxiety and depression    Arthritis    Breast cancer ([place]) 02/13/2009   Cancer of right breast ([place]) 04/16/2012   Depression    Depression 2014   Dysrhythmia    palpitations   Epilepsy ([place])    last seizure 1990's   H/O hiatal hernia    Localization-related (focal) (partial) epilepsy and epileptic syndromes with complex partial seizures, without mention of intractable epilepsy 02/07/2013   Memory deficit 02/07/2013   Memory loss    Mitral valve prolapse 06/01/10   echo- EF>55% mild tricupsid regurgitation. There is mild Pulmonary Hypertension.. Right  ventricular systolic pressure iselevated at 30-40 mmHg. LV  systolic function is normal.   Osteopenia    PONV (postoperative nausea and vomiting)    SOB (shortness of breath) 10/18/11   met-test   Spinal fusion failure    3    PAST SURGICAL HISTORY: Past Surgical History:  Procedure Laterality Date   BACK SURGERY     screws- lumbar   BREAST SURGERY  2010   mastectomy - RIGHT   BRONCHIAL WASHINGS  04/01/2021   Procedure: BRONCHIAL WASHINGS;  Surgeon: Garner Nash, DO;  Location: [facility];  Service: Cardiopulmonary;;   COLONOSCOPY WITH PROPOFOL  03/13/2012   Procedure: COLONOSCOPY WITH PROPOFOL;  Surgeon: Garlan Fair, MD;  Location: [facility];  Service: Endoscopy;  Laterality: N/A;   ESOPHAGOGASTRODUODENOSCOPY  03/13/2012   Procedure: ESOPHAGOGASTRODUODENOSCOPY (EGD);  Surgeon: Garlan Fair, MD;  Location: [facility];  Service: Endoscopy;  Laterality: N/A;  Reflux, Hiatal Hernia   HAND TENDON SURGERY  2012   tendons released in right hand    MASTECTOMY, RADICAL  02/13/2009   SPINE SURGERY  1999, 2003, 2005   TONSILLECTOMY     VIDEO BRONCHOSCOPY Bilateral 04/01/2021   Procedure: VIDEO BRONCHOSCOPY WITHOUT FLUORO;  Surgeon: Garner Nash, DO;  Location: [facility];  Service: Cardiopulmonary;  Laterality: Bilateral;  Ultrathin bronchoscopy    FAMILY HISTORY: Family History  Problem Relation Age of Onset   Arthritis Mother        rhuematoid   Heart disease Mother        Atrial fib   Heart disease Father    Heart disease Sister        Atrial fib    SOCIAL HISTORY: Social History   Socioeconomic History   Marital status: Married    Spouse name: Not on file   Number of children: 1   Years of education: college 2   Highest education level: Not on file  Occupational History   Occupation: unemployed    Employer: UNEMPLOYED    Comment: 2003  Tobacco Use   Smoking status: Every Day    Packs/day: 1.00    Years: 43.00    Pack years: 43.00    Types: Cigarettes   Smokeless tobacco: Never   Tobacco  comments:    1/2 pk per day  Substance and Sexual Activity   Alcohol use: No    Alcohol/week: 0.0 standard drinks   Drug use: No   Sexual activity: Not on file  Other Topics Concern   Not on file  Social History Narrative   Patient lives at home with husband   Patient is right handed.   Patient does not  drink caffeine.   Social Determinants of Health   Financial Resource Strain: Not on file  Food Insecurity: Not on file  Transportation Needs: Not on file  Physical Activity: Not on file  Stress: Not on file  Social Connections: Not on file  Intimate Partner Violence: Not on file   PHYSICAL EXAM  Vitals:   06/09/21 1216  BP: 106/66  Pulse: 95  Weight: 131 lb (59.4 kg)  Height: 5' 2"  (1.575 m)    Body mass index is 23.96 kg/m.  Generalized: Well developed, in no acute distress   Neurological examination  Mentation: Alert oriented to time, place, history taking. Follows all commands speech and language fluent Cranial nerve II-XII: Pupils were equal round reactive to light. Extraocular movements were full, visual field were full on confrontational test. Facial sensation and strength were normal. Head turning and shoulder shrug  were normal and symmetric. Motor: The motor testing reveals 5 over 5 strength of all 4 extremities. Good symmetric motor tone is noted throughout.  Sensory: Sensory testing is intact to soft touch on all 4 extremities. No evidence of extinction is noted.  Coordination: Cerebellar testing reveals good finger-nose-finger and heel-to-shin bilaterally.  Gait and station: Gait is normal. Tandem gait is slightly unsteady.  Reflexes: Deep tendon reflexes are symmetric and normal bilaterally.   DIAGNOSTIC DATA (LABS, IMAGING, TESTING) - I reviewed patient records, labs, notes, testing and imaging myself where available.  Lab Results  Component Value Date   WBC 7.0 06/08/2021   HGB 16.4 (H) 06/08/2021   HCT 48.4 (H) 06/08/2021   MCV 90.0 06/08/2021    PLT 148 (L) 06/08/2021      Component Value Date/Time   NA 137 12/02/2020 1210   NA 143 03/02/2020 0917   K 4.2 12/02/2020 1210   CL 103 12/02/2020 1210   CO2 28 12/02/2020 1210   GLUCOSE 91 12/02/2020 1210   BUN 10 12/02/2020 1210   BUN 11 03/02/2020 0917   CREATININE 0.70 03/08/2021 1404   CALCIUM 8.5 (L) 12/02/2020 1210   PROT 6.9 12/02/2020 1210   PROT 6.3 03/02/2020 0917   ALBUMIN 4.1 12/02/2020 1210   ALBUMIN 4.1 03/02/2020 0917   AST 20 12/02/2020 1210   ALT 24 12/02/2020 1210   ALKPHOS 113 12/02/2020 1210   BILITOT 0.5 12/02/2020 1210   BILITOT <0.2 03/02/2020 0917   GFRNONAA >60 12/02/2020 1210   GFRAA 110 03/02/2020 0917   Lab Results  Component Value Date   CHOL 170 02/17/2017   HDL 70 02/17/2017   LDLCALC 87 02/17/2017   TRIG 65 02/17/2017   CHOLHDL 2.4 02/17/2017   No results found for: HGBA1C Lab Results  Component Value Date   VITAMINB12 183 12/02/2020   Lab Results  Component Value Date   TSH 0.842 04/06/2018    ASSESSMENT AND PLAN 61 y.o. year old female  has a past medical history of Anxiety and depression, Arthritis, Breast cancer ([place]) (02/13/2009), Cancer of right breast ([place]) (04/16/2012), Depression, Depression (2014), Dysrhythmia, Epilepsy ([place]), H/O hiatal hernia, Localization-related (focal) (partial) epilepsy and epileptic syndromes with complex partial seizures, without mention of intractable epilepsy (02/07/2013), Memory deficit (02/07/2013), Memory loss, Mitral valve prolapse (06/01/10), Osteopenia, PONV (postoperative nausea and vomiting), SOB (shortness of breath) (10/18/11), and Spinal fusion failure. here with:  1.  History of seizures 2.  Headache   Patient Instructions  Start Briviact 50 mg twice daily, Samples given to patient.  Discontinue Keppra due to adverse effects Continue with Dilantin, last level  was within normal limits.  Advised patient to contact me if she has any adverse effects from the [place] your  other medication  Routine EEG  Follow-up in 6 months or sooner if worse    Alric Ran, MD 06/09/2021, 1:22 PM [facility] [address] [place], [place] [address] [phone]

## 2021-06-14 ENCOUNTER — Telehealth: Payer: Self-pay | Admitting: Pulmonary Disease

## 2021-06-14 MED ORDER — STIOLTO RESPIMAT 2.5-2.5 MCG/ACT IN AERS: 2.0000 | INHALATION_SPRAY | Freq: Every day | RESPIRATORY_TRACT | 5 refills | Status: DC

## 2021-06-14 NOTE — Telephone Encounter (Signed)
Lm for patient.  

## 2021-06-14 NOTE — Telephone Encounter (Signed)
Spoke to patient.  She is requesting a refill on Stiolto, as she feels that it is effective.  Rx sent to preferred pharmacy.  Nothing further needed.

## 2021-06-14 NOTE — Telephone Encounter (Signed)
Patient is returning phone call. Patient phone number is [phone].

## 2021-06-15 ENCOUNTER — Ambulatory Visit (HOSPITAL_COMMUNITY): Payer: Medicare Other | Admitting: Hematology

## 2021-06-16 ENCOUNTER — Other Ambulatory Visit: Payer: Medicare Other | Admitting: *Deleted

## 2021-06-22 ENCOUNTER — Telehealth: Payer: Self-pay | Admitting: *Deleted

## 2021-06-22 NOTE — Progress Notes (Signed)
Follow up 07/14/21

## 2021-06-22 NOTE — Telephone Encounter (Signed)
[id number]. BRIVIACT TAB '50MG'$  is approved through 04/17/2022. ?

## 2021-06-22 NOTE — Telephone Encounter (Signed)
PA for Briviact '50mg'$  started on covermymeds (key: [id number]). Pt has pharmacy coverage through [employer] (VW#[id number]). Decision pending. ?

## 2021-07-02 ENCOUNTER — Telehealth: Payer: Self-pay | Admitting: Neurology

## 2021-07-02 NOTE — Telephone Encounter (Signed)
Pt requesting to resend order so she can reschedule her EEG.  ? ?Attempted to reschedule EEG, there was a warning that order has been cancelled. ?

## 2021-07-05 NOTE — Telephone Encounter (Signed)
The order for EEG is still active from Dr. April Manson on 05/20/2021. Unsure why there is an error. ? ?Can you help patient schedule her EEG? ?

## 2021-07-05 NOTE — Telephone Encounter (Signed)
I lvm for patient to call us back to reschedule EEG. Order is reinstated. ?

## 2021-07-07 ENCOUNTER — Ambulatory Visit (INDEPENDENT_AMBULATORY_CARE_PROVIDER_SITE_OTHER): Payer: Medicare Other | Admitting: Neurology

## 2021-07-07 ENCOUNTER — Other Ambulatory Visit: Payer: Self-pay

## 2021-07-07 DIAGNOSIS — G40109 Localization-related (focal) (partial) symptomatic epilepsy and epileptic syndromes with simple partial seizures, not intractable, without status epilepticus: Secondary | ICD-10-CM | POA: Diagnosis not present

## 2021-07-08 NOTE — Procedures (Signed)
? ? ?  History: ? ?61 year old woman with seizure disorder  ? ?EEG classification: Awake and drowsy ? ?Description of the recording: The background rhythms of this recording consists of a fairly well modulated medium amplitude alpha rhythm of 9-10 Hz that is reactive to eye opening and closure. As the record progresses, the patient appears to remain in the waking state throughout the recording. Photic stimulation was performed, did not show any abnormalities. Hyperventilation was also performed, did not show any abnormalities. Toward the end of the recording, the patient enters the drowsy state with slight symmetric slowing seen. The patient never enters stage II sleep. No abnormal epileptiform discharges seen during this recording. There was no focal slowing. EKG monitor shows no evidence of cardiac rhythm abnormalities with a heart rate of 60. ? ?Abnormality: None  ? ?Impression: This is a normal EEG recording in the waking and drowsy state. No evidence of interictal epileptiform discharges seen. A normal EEG does not exclude a diagnosis of epilepsy.  ? ? ?Alric Ran, MD ?[facility] ? ? ?

## 2021-07-14 ENCOUNTER — Inpatient Hospital Stay (HOSPITAL_COMMUNITY): Payer: Medicare Other | Admitting: Hematology

## 2021-07-22 ENCOUNTER — Ambulatory Visit (HOSPITAL_COMMUNITY): Payer: Medicare Other | Admitting: Hematology

## 2021-07-22 ENCOUNTER — Encounter (HOSPITAL_COMMUNITY): Payer: Self-pay

## 2021-09-01 ENCOUNTER — Other Ambulatory Visit: Payer: Self-pay

## 2021-09-01 MED ORDER — DILANTIN 30 MG PO CAPS: ORAL_CAPSULE | ORAL | 0 refills | Status: DC

## 2021-09-01 NOTE — Progress Notes (Signed)
Done. Thanks.

## 2021-09-01 NOTE — Progress Notes (Signed)
Rx refilled pending MD review. ? ?Allergy/Contraindication alert for carbamazepine and lamotrigine for anaphylaxis with this medication. ?

## 2021-11-01 ENCOUNTER — Other Ambulatory Visit: Payer: Self-pay

## 2021-11-01 MED ORDER — DILANTIN 100 MG PO CAPS: ORAL_CAPSULE | ORAL | 3 refills | Status: DC

## 2021-11-01 NOTE — Progress Notes (Signed)
Rx refilled.

## 2021-11-09 ENCOUNTER — Other Ambulatory Visit: Payer: Self-pay | Admitting: Cardiovascular Disease

## 2021-11-11 ENCOUNTER — Encounter: Payer: Self-pay | Admitting: Neurology

## 2021-11-11 ENCOUNTER — Other Ambulatory Visit: Payer: Self-pay | Admitting: Neurology

## 2021-11-11 MED ORDER — LEVETIRACETAM 500 MG PO TABS: 500.0000 mg | ORAL_TABLET | Freq: Two times a day (BID) | ORAL | 11 refills | Status: DC

## 2021-11-11 NOTE — Telephone Encounter (Signed)
Keppra sent to the pharmacy.  Thanks

## 2021-11-11 NOTE — Progress Notes (Signed)
Did not start Briviact due to cost, has continued Keppra. Need refills.

## 2021-11-29 ENCOUNTER — Other Ambulatory Visit: Payer: Self-pay | Admitting: Neurology

## 2021-12-08 ENCOUNTER — Encounter: Payer: Self-pay | Admitting: Neurology

## 2021-12-08 ENCOUNTER — Ambulatory Visit (INDEPENDENT_AMBULATORY_CARE_PROVIDER_SITE_OTHER): Payer: Medicare Other | Admitting: Neurology

## 2021-12-08 VITALS — BP 128/87 | HR 71 | Ht 62.0 in | Wt 137.0 lb

## 2021-12-08 DIAGNOSIS — R519 Headache, unspecified: Secondary | ICD-10-CM | POA: Diagnosis not present

## 2021-12-08 DIAGNOSIS — G40109 Localization-related (focal) (partial) symptomatic epilepsy and epileptic syndromes with simple partial seizures, not intractable, without status epilepticus: Secondary | ICD-10-CM | POA: Diagnosis not present

## 2021-12-08 DIAGNOSIS — G8929 Other chronic pain: Secondary | ICD-10-CM

## 2021-12-08 MED ORDER — TOPIRAMATE 100 MG PO TABS: 100.0000 mg | ORAL_TABLET | Freq: Two times a day (BID) | ORAL | 3 refills | Status: DC

## 2021-12-08 NOTE — Patient Instructions (Addendum)
Continue with Keppra 500 mg twice daily  Start with Topiramate 100 mg twice daily, start with 50 mg 1/2 tablet twice daily for one week then increase to full tablet thereafter  Decrease Dilantin to 100 mg twice daily for one week, then further decrease to 100 mg nightly for one week then discontinue it  Follow up in 3 months or sooner if worse

## 2021-12-08 NOTE — Progress Notes (Signed)
PATIENT: Wendy Lopez DOB: 08-25-1960  REASON FOR VISIT: follow up HISTORY FROM: patient Primary Neurologist: Dr. Jannifer Franklin  Today 12/08/21 Wendy Lopez presents today for follow-up, last visit was in February.  At that time, due to side effect of Keppra we decided to switch over to Briviact.  The medication was approved by her insurance but her co-pay was almost $500 a month which she could not afford.  She was put back on the Keppra and Dilantin.  Denies any additional seizures but still experience the side effect.  She would like one day to come off Keppra and Dilantin if possible.  Her current complaint at the moment is worsening migraines.  In the past she was put on Trokendi but reported have side effect, Trokendi made her migraine worse. She is willing to try it again (Topamax)   INTERVAL HISTORY 06/09/2021: Patient present today for follow-up, last visit was in July.  At that time she reported doing well on Keppra and Dilantin, no seizures.  She reported in January she was diagnosed with infection and was put on Levaquin, while on Levaquin she had 2 of her typical seizure with the aura, no convulsions.  Patient report with the [place] she had personality changes, she cannot focus, cannot concentrate she is irritable and would like to switch medication.  Discussed about other type of medication but she has allergy to the failure on general medication.  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   INTERVAL HISTORY 10/26/2020 Wendy Lopez is a 61 year old female with history of seizures and migraine headaches. Had dizziness last year, MRI of the brain showed few white matter lesions, nonspecific, could be related to migraine history. Requires brand name medications, highly allergic to filler in generic medications, on Dilantin, but generic Keppra. Took brand name Trokendi for headaches, reports facial swelling, breaking out, tremors, stopped it.  Had headaches up until March, gradually continues to improve. Last week had migraine, has vomiting with migraines, doesn't want more medications due to high sensitivity. Had Dilantin level checked 10/20/20, Dilantin level 8.4. Claims pre-seizure feeling last week, foggy, sensation/squeezing in neck and chest. 4 grand mal seizures in her life, most have been partial. Wonders if brand name medication Dilantin has been changed to account for recent feeling. Here today alone.   Reviewed blood work from PCP collected October 20, 2020: Dilantin level was 8.4, Keppra level was 23.7, CMP was normal, CBC showed slightly elevated HCT 47.2, platelet level mildly low 136, TSH 1.08.  Update 03/02/2020 SS: [name] is a 61 year old female with history of seizures, last seizure was in August 2020, she had awakened and had bitten her tongue, her Dilantin was increased.  She is on Dilantin and Keppra.  She is highly allergic to, filler in generic medications, but is tolerating generic Keppra, has found a specific manufacturer (Lupen) works well for her.  Today, reports return of her migraines, that she was experiencing her 30s, also dizziness (floaty sensation), hard to say exactly when it started, but probably around May.  Headache is bilateral, pounding, photophobia, phonophobia, nausea, occasional vomiting.  Reports daily headache, usually not in the morning, could be anytime throughout the day.  Unable to identify triggers.  Headache and dizziness are related.  Taking daily Excedrin Migraine.  Usually dizziness is brought on by movement on the screen (TV/phone), pending appointment with ophthalmology.  Has seen PCP, CT head in October was unremarkable.  Since June, noted  decreased sensation/numbness to her right lower face, no facial droop or slurred speech.  Has had several falls related to dizziness.  No reported changes in medications.  Has seen cardiology, heart monitor is pending.  Had Covid vaccine 2nd dose May 10th,  that night, had fever 103, shaking, had dj vu spell, which can be associated with seizure.  Had a tremor in both hands for 8 weeks, that went away.  Presents today for evaluation unaccompanied.  HISTORY 06/05/2019 SS: [name] is a 61 year old female with history of seizures.  When last seen, she had likely seizure episode, when she had awakened and had bitten her tongue.  Her Dilantin level was increased with a 30 mg capsule, she remains on Keppra.  She indicates since increasing her dose of Dilantin, she is actually feeling a lot better, has been having more energy, she is very pleased.  She is taking generic Keppra, has a Licensed conveyancer (Lupen) that works for her.  She says she is highly allergic to a common filler in generic medications.  Her allergy has been so significant, at times has had to use EpiPen.  She has not had recurrent seizure.  She is working with pharmacy, unsure, how much more medication she will be able to receive from this manufacturer.  In the past, she has been on brand-name Keppra, but insurance did not pay.  She presents today for follow-up unaccompanied.   REVIEW OF SYSTEMS: Out of a complete 14 system review of symptoms, the patient complains only of the following symptoms, and all other reviewed systems are negative.  See HPI  ALLERGIES: Allergies  Allergen Reactions   Carbamazepine Anaphylaxis   Lamictal [Lamotrigine] Anaphylaxis    SOB, Lamotrigine only   Other     STATES HAS SHORTNESS OF BREATH WITH GENERIC DRUGS     ALSO RASH AND ITCHING   Prednisone Shortness Of Breath, Rash and Other (See Comments)   Pseudoephedrine Hcl Er Other (See Comments)    SEIZURES.   Valium Other (See Comments)    Hallucinations, spasticity, hyper-relfexive    Amlodipine Rash    Face swell   Oxycodone Nausea And Vomiting and Rash   Ibuprofen Swelling   Percocet [Oxycodone-Acetaminophen] Nausea And Vomiting   Trokendi Kellogg Er] Other (See Comments)    Made  headache worse, dizziness, difficulty breathing, rash   Betadine [Povidone Iodine] Rash   Latex Dermatitis and Rash   Shellfish Allergy Swelling    HOME MEDICATIONS: Outpatient Medications Prior to Visit  Medication Sig Dispense Refill   albuterol (VENTOLIN HFA) 108 (90 Base) MCG/ACT inhaler 2 puffs every 6 (six) hours as needed for wheezing or shortness of breath.     aspirin-acetaminophen-caffeine (EXCEDRIN MIGRAINE) 250-250-65 MG tablet Take 1 tablet by mouth every 8 (eight) hours as needed for headache.     Cyanocobalamin (VITAMIN B12) 1000 MCG TBCR Take 1,000 mcg by mouth in the morning.     DILANTIN 100 MG ER capsule TAKE 1 CAPSULE(100 MG) BY MOUTH THREE TIMES DAILY 270 capsule 3   DILANTIN 30 MG ER capsule TAKE 1 CAPSULE(30 MG) BY MOUTH DAILY 90 capsule 2   diltiazem (CARDIZEM) 30 MG tablet Take 30 mg by mouth 3 (three) times daily as needed (heart palpitations/increased heart rates).     EPINEPHrine 0.3 mg/0.3 mL IJ SOAJ injection Inject 0.3 mg into the muscle as needed for anaphylaxis.     levETIRAcetam (KEPPRA) 500 MG tablet Take 1 tablet (500 mg total) by mouth 2 (two)  times daily. 120 tablet 11   meclizine (ANTIVERT) 25 MG tablet Take 25 mg by mouth 3 (three) times daily as needed for dizziness.     metoprolol succinate (TOPROL-XL) 100 MG 24 hr tablet TAKE 1 TABLET(100 MG) BY MOUTH DAILY WITH OR IMMEDIATELY FOLLOWING A MEAL 90 tablet 3   Misc. Devices MISC Please provide patient with right breast prosthesis and 6 mastectomy bras sue to a history of right breast cancer and mastectomy. (ICD10- C50.111) 6 each 0   sodium chloride (OCEAN) 0.65 % nasal spray Place 1-2 sprays into the nose as needed for congestion.     Tiotropium Bromide-Olodaterol (STIOLTO RESPIMAT) 2.5-2.5 MCG/ACT AERS Inhale 2 puffs into the lungs daily. 4 g 5   Vitamin D, Ergocalciferol, (DRISDOL) 1.25 MG (50000 UT) CAPS capsule Take 50,000 Units by mouth every Tuesday.     ZYRTEC ALLERGY 10 MG tablet Take 10 mg by  mouth at bedtime.     No facility-administered medications prior to visit.    PAST MEDICAL HISTORY: Past Medical History:  Diagnosis Date   Anxiety and depression    Arthritis    Breast cancer ([place]) 02/13/2009   Cancer of right breast ([place]) 04/16/2012   Depression    Depression 2014   Dysrhythmia    palpitations   Epilepsy ([place])    last seizure 1990's   H/O hiatal hernia    Localization-related (focal) (partial) epilepsy and epileptic syndromes with complex partial seizures, without mention of intractable epilepsy 02/07/2013   Memory deficit 02/07/2013   Memory loss    Mitral valve prolapse 06/01/10   echo- EF>55% mild tricupsid regurgitation. There is mild Pulmonary Hypertension.. Right  ventricular systolic pressure iselevated at 30-40 mmHg. LV systolic function is normal.   Osteopenia    PONV (postoperative nausea and vomiting)    SOB (shortness of breath) 10/18/11   met-test   Spinal fusion failure    3    PAST SURGICAL HISTORY: Past Surgical History:  Procedure Laterality Date   BACK SURGERY     screws- lumbar   BREAST SURGERY  2010   mastectomy - RIGHT   BRONCHIAL WASHINGS  04/01/2021   Procedure: BRONCHIAL WASHINGS;  Surgeon: Garner Nash, DO;  Location: [facility];  Service: Cardiopulmonary;;   COLONOSCOPY WITH PROPOFOL  03/13/2012   Procedure: COLONOSCOPY WITH PROPOFOL;  Surgeon: Garlan Fair, MD;  Location: [facility];  Service: Endoscopy;  Laterality: N/A;   ESOPHAGOGASTRODUODENOSCOPY  03/13/2012   Procedure: ESOPHAGOGASTRODUODENOSCOPY (EGD);  Surgeon: Garlan Fair, MD;  Location: [facility];  Service: Endoscopy;  Laterality: N/A;  Reflux, Hiatal Hernia   HAND TENDON SURGERY  2012   tendons released in right hand    MASTECTOMY, RADICAL  02/13/2009   SPINE SURGERY  1999, 2003, 2005   TONSILLECTOMY     VIDEO BRONCHOSCOPY Bilateral 04/01/2021   Procedure: VIDEO BRONCHOSCOPY WITHOUT FLUORO;  Surgeon: Garner Nash, DO;  Location: [facility]  ENDOSCOPY;  Service: Cardiopulmonary;  Laterality: Bilateral;  Ultrathin bronchoscopy    FAMILY HISTORY: Family History  Problem Relation Age of Onset   Arthritis Mother        rhuematoid   Heart disease Mother        Atrial fib   Heart disease Father    Heart disease Sister        Atrial fib    SOCIAL HISTORY: Social History   Socioeconomic History   Marital status: Married    Spouse name: Not on file   Number of  children: 1   Years of education: college 2   Highest education level: Not on file  Occupational History   Occupation: unemployed    Employer: UNEMPLOYED    Comment: 2003  Tobacco Use   Smoking status: Every Day    Packs/day: 1.00    Years: 43.00    Total pack years: 43.00    Types: Cigarettes   Smokeless tobacco: Never   Tobacco comments:    1/2 pk per day  Substance and Sexual Activity   Alcohol use: No    Alcohol/week: 0.0 standard drinks of alcohol   Drug use: No   Sexual activity: Not on file  Other Topics Concern   Not on file  Social History Narrative   Patient lives at home with husband   Patient is right handed.   Patient does not drink caffeine.   Social Determinants of Health   Financial Resource Strain: Not on file  Food Insecurity: Not on file  Transportation Needs: Not on file  Physical Activity: Not on file  Stress: Not on file  Social Connections: Not on file  Intimate Partner Violence: Not on file   PHYSICAL EXAM  Vitals:   12/08/21 1138  BP: 128/87  Pulse: 71  Weight: 137 lb (62.1 kg)  Height: 5' 2"  (1.575 m)    Body mass index is 25.06 kg/m.  Generalized: Well developed, in no acute distress   Neurological examination  Mentation: Alert oriented to time, place, history taking. Follows all commands speech and language fluent Cranial nerve II-XII: Pupils were equal round reactive to light. Extraocular movements were full, visual field were full on confrontational test. Facial sensation and strength were normal. Head  turning and shoulder shrug  were normal and symmetric. Motor: The motor testing reveals 5 over 5 strength of all 4 extremities. Good symmetric motor tone is noted throughout.  Sensory: Sensory testing is intact to soft touch on all 4 extremities. No evidence of extinction is noted.  Coordination: Cerebellar testing reveals good finger-nose-finger and heel-to-shin bilaterally.  Gait and station: Gait is normal. Tandem gait is slightly unsteady.  Reflexes: Deep tendon reflexes are symmetric and normal bilaterally.   DIAGNOSTIC DATA (LABS, IMAGING, TESTING) - I reviewed patient records, labs, notes, testing and imaging myself where available.  Lab Results  Component Value Date   WBC 7.0 06/08/2021   HGB 16.4 (H) 06/08/2021   HCT 48.4 (H) 06/08/2021   MCV 90.0 06/08/2021   PLT 148 (L) 06/08/2021      Component Value Date/Time   NA 137 12/02/2020 1210   NA 143 03/02/2020 0917   K 4.2 12/02/2020 1210   CL 103 12/02/2020 1210   CO2 28 12/02/2020 1210   GLUCOSE 91 12/02/2020 1210   BUN 10 12/02/2020 1210   BUN 11 03/02/2020 0917   CREATININE 0.70 03/08/2021 1404   CALCIUM 8.5 (L) 12/02/2020 1210   PROT 6.9 12/02/2020 1210   PROT 6.3 03/02/2020 0917   ALBUMIN 4.1 12/02/2020 1210   ALBUMIN 4.1 03/02/2020 0917   AST 20 12/02/2020 1210   ALT 24 12/02/2020 1210   ALKPHOS 113 12/02/2020 1210   BILITOT 0.5 12/02/2020 1210   BILITOT <0.2 03/02/2020 0917   GFRNONAA >60 12/02/2020 1210   GFRAA 110 03/02/2020 0917   Lab Results  Component Value Date   CHOL 170 02/17/2017   HDL 70 02/17/2017   LDLCALC 87 02/17/2017   TRIG 65 02/17/2017   CHOLHDL 2.4 02/17/2017   No results found for: "HGBA1C"  Lab Results  Component Value Date   FSELTRVU02 334 12/02/2020   Lab Results  Component Value Date   TSH 0.842 04/06/2018    ASSESSMENT AND PLAN 61 y.o. year old female  has a past medical history of Anxiety and depression, Arthritis, Breast cancer ([place]) (02/13/2009), Cancer of right  breast ([place]) (04/16/2012), Depression, Depression (2014), Dysrhythmia, Epilepsy ([place]), H/O hiatal hernia, Localization-related (focal) (partial) epilepsy and epileptic syndromes with complex partial seizures, without mention of intractable epilepsy (02/07/2013), Memory deficit (02/07/2013), Memory loss, Mitral valve prolapse (06/01/10), Osteopenia, PONV (postoperative nausea and vomiting), SOB (shortness of breath) (10/18/11), and Spinal fusion failure. here with:  1.  History of seizures 2.  Headache   Patient Instructions  Continue with Keppra 500 mg twice daily  Start with Topiramate 100 mg twice daily, start with 50 mg 1/2 tablet twice daily for one week then increase to full tablet thereafter  Decrease Dilantin to 100 mg twice daily for one week, then further decrease to 100 mg nightly for one week then discontinue it  Follow up in 3 months or sooner if worse    I have spent a total of 30 minutes dedicated to this patient today, preparing to see patient, performing a medically appropriate examination and evaluation, ordering tests and/or medications and procedures, and counseling and educating the patient/family/caregiver; independently interpreting result and communicating results to the family/patient/caregiver; and documenting clinical information in the electronic medical record.   Alric Ran, MD 12/08/2021, 1:07 PM [facility] [address] [place], [place] [address] [phone]

## 2021-12-09 ENCOUNTER — Other Ambulatory Visit (HOSPITAL_COMMUNITY): Payer: Self-pay | Admitting: Hematology

## 2021-12-09 DIAGNOSIS — Z1231 Encounter for screening mammogram for malignant neoplasm of breast: Secondary | ICD-10-CM

## 2021-12-22 ENCOUNTER — Ambulatory Visit (HOSPITAL_COMMUNITY): Payer: Medicare Other

## 2021-12-30 ENCOUNTER — Encounter (HOSPITAL_COMMUNITY): Payer: Self-pay

## 2021-12-30 ENCOUNTER — Inpatient Hospital Stay (HOSPITAL_COMMUNITY): Admission: RE | Admit: 2021-12-30 | Payer: Medicare Other | Source: Ambulatory Visit

## 2022-01-27 ENCOUNTER — Other Ambulatory Visit (HOSPITAL_COMMUNITY): Payer: Self-pay | Admitting: Hematology

## 2022-01-27 DIAGNOSIS — Z87891 Personal history of nicotine dependence: Secondary | ICD-10-CM

## 2022-01-27 DIAGNOSIS — R918 Other nonspecific abnormal finding of lung field: Secondary | ICD-10-CM

## 2022-01-27 DIAGNOSIS — C50111 Malignant neoplasm of central portion of right female breast: Secondary | ICD-10-CM

## 2022-01-28 ENCOUNTER — Inpatient Hospital Stay: Payer: Medicare Other | Attending: Hematology

## 2022-01-28 ENCOUNTER — Ambulatory Visit (HOSPITAL_COMMUNITY)
Admission: RE | Admit: 2022-01-28 | Discharge: 2022-01-28 | Disposition: A | Discharge status: Home / Self Care | Payer: Medicare Other | Source: Ambulatory Visit | Attending: Hematology | Admitting: Hematology

## 2022-01-28 DIAGNOSIS — C50911 Malignant neoplasm of unspecified site of right female breast: Secondary | ICD-10-CM | POA: Diagnosis not present

## 2022-01-28 DIAGNOSIS — Z853 Personal history of malignant neoplasm of breast: Secondary | ICD-10-CM | POA: Insufficient documentation

## 2022-01-28 DIAGNOSIS — Z170 Estrogen receptor positive status [ER+]: Secondary | ICD-10-CM

## 2022-01-28 DIAGNOSIS — Z79899 Other long term (current) drug therapy: Secondary | ICD-10-CM | POA: Insufficient documentation

## 2022-01-28 DIAGNOSIS — E559 Vitamin D deficiency, unspecified: Secondary | ICD-10-CM | POA: Insufficient documentation

## 2022-01-28 DIAGNOSIS — R911 Solitary pulmonary nodule: Secondary | ICD-10-CM | POA: Insufficient documentation

## 2022-01-28 DIAGNOSIS — R918 Other nonspecific abnormal finding of lung field: Secondary | ICD-10-CM | POA: Insufficient documentation

## 2022-01-28 DIAGNOSIS — D513 Other dietary vitamin B12 deficiency anemia: Secondary | ICD-10-CM

## 2022-01-28 DIAGNOSIS — F17210 Nicotine dependence, cigarettes, uncomplicated: Secondary | ICD-10-CM | POA: Diagnosis not present

## 2022-01-28 DIAGNOSIS — J984 Other disorders of lung: Secondary | ICD-10-CM | POA: Diagnosis not present

## 2022-01-28 DIAGNOSIS — C50111 Malignant neoplasm of central portion of right female breast: Secondary | ICD-10-CM | POA: Insufficient documentation

## 2022-01-28 DIAGNOSIS — Z87891 Personal history of nicotine dependence: Secondary | ICD-10-CM | POA: Insufficient documentation

## 2022-01-28 DIAGNOSIS — E538 Deficiency of other specified B group vitamins: Secondary | ICD-10-CM | POA: Insufficient documentation

## 2022-01-28 LAB — COMPREHENSIVE METABOLIC PANEL
ALT: 15 U/L (ref 0–44)
AST: 16 U/L (ref 15–41)
Albumin: 3.8 g/dL (ref 3.5–5.0)
Alkaline Phosphatase: 112 U/L (ref 38–126)
Anion gap: 7 (ref 5–15)
BUN: 13 mg/dL (ref 8–23)
CO2: 24 mmol/L (ref 22–32)
Calcium: 8.7 mg/dL — ABNORMAL LOW (ref 8.9–10.3)
Chloride: 110 mmol/L (ref 98–111)
Creatinine, Ser: 0.81 mg/dL (ref 0.44–1.00)
GFR, Estimated: >60 mL/min (ref >60)
Glucose, Bld: 92 mg/dL (ref 70–99)
Potassium: 3.9 mmol/L (ref 3.5–5.1)
Sodium: 141 mmol/L (ref 135–145)
Total Bilirubin: 0.8 mg/dL (ref 0.3–1.2)
Total Protein: 6.7 g/dL (ref 6.5–8.1)

## 2022-01-28 LAB — CBC WITH DIFFERENTIAL/PLATELET
Abs Immature Granulocytes: 0.02 10*3/uL (ref 0.00–0.07)
Basophils Absolute: 0.1 10*3/uL (ref 0.0–0.1)
Basophils Relative: 2 %
Eosinophils Absolute: 0.2 10*3/uL (ref 0.0–0.5)
Eosinophils Relative: 3 %
HCT: 46.9 % — ABNORMAL HIGH (ref 36.0–46.0)
Hemoglobin: 16.3 g/dL — ABNORMAL HIGH (ref 12.0–15.0)
Immature Granulocytes: 0 %
Lymphocytes Relative: 44 %
Lymphs Abs: 3.2 10*3/uL (ref 0.7–4.0)
MCH: 31.1 pg (ref 26.0–34.0)
MCHC: 34.8 g/dL (ref 30.0–36.0)
MCV: 89.5 fL (ref 80.0–100.0)
Monocytes Absolute: 0.6 10*3/uL (ref 0.1–1.0)
Monocytes Relative: 8 %
Neutro Abs: 3.2 10*3/uL (ref 1.7–7.7)
Neutrophils Relative %: 43 %
Platelets: 158 10*3/uL (ref 150–400)
RBC: 5.24 MIL/uL — ABNORMAL HIGH (ref 3.87–5.11)
RDW: 13.1 % (ref 11.5–15.5)
WBC: 7.3 10*3/uL (ref 4.0–10.5)
nRBC: 0 % (ref 0.0–0.2)

## 2022-01-28 LAB — VITAMIN D 25 HYDROXY (VIT D DEFICIENCY, FRACTURES): Vit D, 25-Hydroxy: 14.56 ng/mL — ABNORMAL LOW (ref 30–100)

## 2022-01-28 LAB — VITAMIN B12: Vitamin B-12: 176 pg/mL — ABNORMAL LOW (ref 180–914)

## 2022-01-28 MED ORDER — IOHEXOL 300 MG/ML  SOLN
75.0000 mL | Freq: Once | INTRAMUSCULAR | Status: AC | PRN
  Administered 2022-01-28: 75 mL via INTRAVENOUS

## 2022-02-02 ENCOUNTER — Telehealth: Payer: Self-pay | Admitting: Pulmonary Disease

## 2022-02-02 DIAGNOSIS — Z87891 Personal history of nicotine dependence: Secondary | ICD-10-CM

## 2022-02-02 DIAGNOSIS — Z122 Encounter for screening for malignant neoplasm of respiratory organs: Secondary | ICD-10-CM

## 2022-02-02 DIAGNOSIS — F17210 Nicotine dependence, cigarettes, uncomplicated: Secondary | ICD-10-CM

## 2022-02-02 NOTE — Telephone Encounter (Signed)
Pt had CT Chest W Contrast on 01/28/22 ordered by another provider:  IMPRESSION: 1. New 3 mm right upper lobe pulmonary nodule, nonspecific, but statistically likely benign. No other imaging findings strongly suggestive of metastatic disease on today's examination. Attention on follow-up studies is recommended to ensure regression. 2. Aortic atherosclerosis.   Aortic Atherosclerosis (ICD10-I70.0).  When do you want pt to return to lung cancer screening?

## 2022-02-04 NOTE — Telephone Encounter (Signed)
Order placed for 12 mth follow up lung cancer screening CT. Pt will be called closer to the time to schedule.

## 2022-02-07 ENCOUNTER — Ambulatory Visit (HOSPITAL_COMMUNITY): Payer: Medicare Other | Admitting: Hematology

## 2022-02-10 ENCOUNTER — Inpatient Hospital Stay (HOSPITAL_BASED_OUTPATIENT_CLINIC_OR_DEPARTMENT_OTHER): Payer: Medicare Other | Admitting: Hematology

## 2022-02-10 ENCOUNTER — Encounter: Payer: Self-pay | Admitting: Hematology

## 2022-02-10 VITALS — BP 125/81 | HR 79 | Temp 97.9°F | Resp 18 | Wt 134.0 lb

## 2022-02-10 DIAGNOSIS — D513 Other dietary vitamin B12 deficiency anemia: Secondary | ICD-10-CM | POA: Diagnosis not present

## 2022-02-10 DIAGNOSIS — F17210 Nicotine dependence, cigarettes, uncomplicated: Secondary | ICD-10-CM | POA: Diagnosis not present

## 2022-02-10 DIAGNOSIS — C50111 Malignant neoplasm of central portion of right female breast: Secondary | ICD-10-CM

## 2022-02-10 DIAGNOSIS — Z87891 Personal history of nicotine dependence: Secondary | ICD-10-CM

## 2022-02-10 DIAGNOSIS — Z170 Estrogen receptor positive status [ER+]: Secondary | ICD-10-CM

## 2022-02-10 DIAGNOSIS — E559 Vitamin D deficiency, unspecified: Secondary | ICD-10-CM | POA: Diagnosis not present

## 2022-02-10 DIAGNOSIS — E538 Deficiency of other specified B group vitamins: Secondary | ICD-10-CM | POA: Diagnosis not present

## 2022-02-10 DIAGNOSIS — Z853 Personal history of malignant neoplasm of breast: Secondary | ICD-10-CM | POA: Diagnosis not present

## 2022-02-10 DIAGNOSIS — R911 Solitary pulmonary nodule: Secondary | ICD-10-CM | POA: Diagnosis not present

## 2022-02-10 DIAGNOSIS — Z79899 Other long term (current) drug therapy: Secondary | ICD-10-CM | POA: Diagnosis not present

## 2022-02-10 MED ORDER — VITAMIN D (ERGOCALCIFEROL) 1.25 MG (50000 UNIT) PO CAPS: 50000.0000 [IU] | ORAL_CAPSULE | ORAL | 11 refills | Status: DC

## 2022-02-10 NOTE — Patient Instructions (Signed)
[facility]  Discharge Instructions  You were seen and examined today by Dr. Delton Coombes.  Dr. Delton Coombes discussed your most recent lab work which revealed that your Vitamin D level is low.   Dr. Delton Coombes recommends that you have your Vitamin D 50,000 units increased to twice weekly.   Follow-up as scheduled in 1 year.    Thank you for choosing [facility] to provide your oncology and hematology care.   To afford each patient quality time with our provider, please arrive at least 15 minutes before your scheduled appointment time. You may need to reschedule your appointment if you arrive late (10 or more minutes). Arriving late affects you and other patients whose appointments are after yours.  Also, if you miss three or more appointments without notifying the office, you may be dismissed from the clinic at the provider's discretion.    Again, thank you for choosing [facility].  Our hope is that these requests will decrease the amount of time that you wait before being seen by our physicians.   If you have a lab appointment with the [facility] please come in thru the Main Entrance and check in at the main information desk.           _____________________________________________________________  Should you have questions after your visit to [facility], please contact our office at [phone] and follow the prompts.  Our office hours are 8:00 a.m. to 4:30 p.m. Monday - Thursday and 8:00 a.m. to 2:30 p.m. Friday.  Please note that voicemails left after 4:00 p.m. may not be returned until the following business day.  We are closed weekends and all major holidays.  You do have access to a nurse 24-7, just call the main number to the clinic [phone] and do not press any options, hold on the line and a nurse will answer the phone.    For prescription refill requests, have your pharmacy contact our office  and allow 72 hours.    Masks are optional in the cancer centers. If you would like for your care team to wear a mask while they are taking care of you, please let them know. You may have one support person who is at least 62 years old accompany you for your appointments.

## 2022-02-10 NOTE — Progress Notes (Signed)
[facility] [address], [place] [address]   Patient Care Team: Wenda Low, MD as PCP - General (Internal Medicine) Troy Sine, MD as PCP - Cardiology (Cardiology) Ashok Pall, MD as Consulting Physician (Neurosurgery)  SUMMARY OF ONCOLOGIC HISTORY: Oncology History  Cancer of right breast [facility])  01/13/2009 Initial Diagnosis   Needle core biopsy of 12 o'clock mass in right breast and axillary lymph node demonstrating invasive mammary carcinoma with negative lymph node.  ER 99%, PR 54%, Ki-67 15%, and Her2  negative.   02/13/2009 Definitive Surgery   Right simple mastectomy by Dr. Dalbert Batman demonstrating a 1.7 cm invasive ductal carcinoma, grade 1, no LVI, clear margins and 0/3 sentinel lymph nodes.    Oncotype testing   Intermediate-risk; was offered chemotherapy     Adjuvant Chemotherapy   Taxotere/Cytoxan x 3 cycles completed (unable to complete final cycle due to intolerance; patient weight was reportedly not dose-adjusted).     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  No subsequent anti-estrogen therapy was given.     Procedure   Per patient, she had genetic testing in the past; results were negative per her report.      CHIEF COMPLIANT: Follow-up for right breast cancer   INTERVAL HISTORY: Wendy Lopez is a 61 y.o. female seen for follow-up of right breast cancer.  Denies any new onset pains.  Energy levels are reported as 100%.  REVIEW OF SYSTEMS:   Review of Systems  All other systems reviewed and are negative.   I have reviewed the past medical history, past surgical history, social history and family history with the patient and they are unchanged from previous note.   ALLERGIES:   is allergic to carbamazepine, lamictal [lamotrigine], other, prednisone, pseudoephedrine  hcl er, valium, amlodipine, oxycodone, ibuprofen, percocet [oxycodone-acetaminophen], trokendi xr [topiramate er], betadine [povidone iodine], latex, and shellfish allergy.   MEDICATIONS:  Current Outpatient Medications  Medication Sig Dispense Refill   albuterol (VENTOLIN HFA) 108 (90 Base) MCG/ACT inhaler 2 puffs every 6 (six) hours as needed for wheezing or shortness of breath.     aspirin-acetaminophen-caffeine (EXCEDRIN MIGRAINE) 250-250-65 MG tablet Take 1 tablet by mouth every 8 (eight) hours as needed for headache.     Cyanocobalamin (VITAMIN B12) 1000 MCG TBCR Take 1,000 mcg by mouth in the morning.     diltiazem (CARDIZEM) 30 MG tablet Take 30 mg by mouth 3 (three) times daily as needed (heart palpitations/increased heart rates).     EPINEPHrine 0.3 mg/0.3 mL IJ SOAJ injection Inject 0.3 mg into the muscle as needed for anaphylaxis.     levETIRAcetam (KEPPRA) 500 MG tablet Take 1 tablet (500 mg total) by mouth 2 (two) times daily. 120 tablet 11   meclizine (ANTIVERT) 25 MG tablet Take 25 mg by mouth 3 (three) times daily as needed for dizziness.     metoprolol succinate (TOPROL-XL) 100 MG 24 hr tablet TAKE 1 TABLET(100 MG) BY MOUTH DAILY WITH OR IMMEDIATELY FOLLOWING A MEAL 90 tablet 3   Misc. Devices MISC Please provide patient with right breast prosthesis and 6 mastectomy bras sue to a history of right breast cancer and mastectomy. (ICD10- C50.111) 6 each 0   sodium chloride (OCEAN) 0.65 % nasal spray Place 1-2 sprays into the nose as needed for congestion.     Tiotropium Bromide-Olodaterol (STIOLTO  RESPIMAT) 2.5-2.5 MCG/ACT AERS Inhale 2 puffs into the lungs daily. 4 g 5   topiramate (TOPAMAX) 100 MG tablet Take 1 tablet (100 mg total) by mouth 2 (two) times daily. 60 tablet 3   Vitamin D, Ergocalciferol, (DRISDOL) 1.25 MG (50000 UT) CAPS capsule Take 50,000 Units by mouth every Tuesday.     ZYRTEC ALLERGY 10 MG tablet Take 10 mg by mouth at bedtime.     DILANTIN 100 MG ER capsule  TAKE 1 CAPSULE(100 MG) BY MOUTH THREE TIMES DAILY 270 capsule 3   DILANTIN 30 MG ER capsule TAKE 1 CAPSULE(30 MG) BY MOUTH DAILY 90 capsule 2   No current facility-administered medications for this visit.     PHYSICAL EXAMINATION: Performance status (ECOG): 1 - Symptomatic but completely ambulatory  Vitals:   02/10/22 1443  BP: 125/81  Pulse: 79  Resp: 18  Temp: 97.9 F (36.6 C)  SpO2: 97%   Wt Readings from Last 3 Encounters:  02/10/22 134 lb (60.8 kg)  12/08/21 137 lb (62.1 kg)  06/09/21 131 lb (59.4 kg)   Physical Exam Vitals reviewed.  Constitutional:      Appearance: Normal appearance.  Cardiovascular:     Rate and Rhythm: Normal rate and regular rhythm.     Pulses: Normal pulses.     Heart sounds: Normal heart sounds.  Pulmonary:     Effort: Pulmonary effort is normal.     Breath sounds: Normal breath sounds.  Chest:  Breasts:    Right: Absent. Skin change: slight thickness in center of mastectomy scar; scar stable.     Left: Normal.  Lymphadenopathy:     Upper Body:     Right upper body: No supraclavicular, axillary or pectoral adenopathy.     Left upper body: No supraclavicular, axillary or pectoral adenopathy.  Neurological:     General: No focal deficit present.     Mental Status: She is alert and oriented to person, place, and time.  Psychiatric:        Mood and Affect: Mood normal.        Behavior: Behavior normal.     Breast Exam Chaperone: Thana Ates     LABORATORY DATA:  I have reviewed the data as listed    Latest Ref Rng & Units 01/28/2022   10:05 AM 03/08/2021    2:04 PM 12/02/2020   12:10 PM  CMP  Glucose 70 - 99 mg/dL 92   91   BUN 8 - 23 mg/dL 13   10   Creatinine 0.44 - 1.00 mg/dL 0.81  0.70  0.72   Sodium 135 - 145 mmol/L 141   137   Potassium 3.5 - 5.1 mmol/L 3.9   4.2   Chloride 98 - 111 mmol/L 110   103   CO2 22 - 32 mmol/L 24   28   Calcium 8.9 - 10.3 mg/dL 8.7   8.5   Total Protein 6.5 - 8.1 g/dL 6.7   6.9   Total  Bilirubin 0.3 - 1.2 mg/dL 0.8   0.5   Alkaline Phos 38 - 126 U/L 112   113   AST 15 - 41 U/L 16   20   ALT 0 - 44 U/L 15   24    No results found for: "CAN153" Lab Results  Component Value Date   WBC 7.3 01/28/2022   HGB 16.3 (H) 01/28/2022   HCT 46.9 (H) 01/28/2022   MCV 89.5 01/28/2022   PLT 158 01/28/2022  NEUTROABS 3.2 01/28/2022    ASSESSMENT:  1.  Stage Ia right breast cancer: - Diagnosed in September 2010, right mastectomy and SLNB-1.7 cm IDC, 0/3 lymph nodes positive, ER/PR positive and HER2 negative. - 3 cycles of Taxotere and Cytoxan chemotherapy. - Unable to tolerate antiestrogen therapy.   PLAN:  1.  Stage Ia right breast cancer: - Last mammogram on 12/16/2020 was negative. - Physical examination at the right mastectomy site is within normal limits.  Left breast has no palpable masses.  No palpable adenopathy. - Labs from 01/28/2022 showed normal LFTs.  CBC was grossly normal. - I will schedule her mammogram next available as she missed her previous appointments.  RTC 1 year for follow-up.    2.  JAK2 negative erythrocytosis: - She has mildly elevated hemoglobin and hematocrit from smoking. - She is smoking about 5 cigarettes/day.   3.  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  No other suspicious lesions were seen. - She is smoking about 5 cigarettes/day.  Her urge for smoking is low since she has been started on Topamax recently.  4.  Vitamin D deficiency: - She is taking vitamin D 50,000 units weekly.  Vitamin D level is still low at 14. - We will increase vitamin D to 50,000 units twice weekly.  She was advised to have her levels checked by her PMD.  5.  Low B12 levels: - She is taking B12 1 mg daily.  B12 level is 176. - Recommend increasing vitamin B12 to 2 mg on Mondays, Wednesdays and Fridays and  1 mg rest of the week.  Breast Cancer therapy associated bone loss: I have recommended calcium, Vitamin D and weight bearing exercises.  Orders placed this encounter:  No orders of the defined types were placed in this encounter.   The patient has a good understanding of the overall plan. She agrees with it. She will call with any problems that may develop before the next visit here.  Derek Jack, MD [place] [phone]

## 2022-02-21 ENCOUNTER — Ambulatory Visit (HOSPITAL_COMMUNITY)
Admission: RE | Admit: 2022-02-21 | Discharge: 2022-02-21 | Disposition: A | Discharge status: Home / Self Care | Payer: Medicare Other | Source: Ambulatory Visit | Attending: Hematology | Admitting: Hematology

## 2022-02-21 DIAGNOSIS — Z1231 Encounter for screening mammogram for malignant neoplasm of breast: Secondary | ICD-10-CM | POA: Diagnosis not present

## 2022-02-23 ENCOUNTER — Telehealth: Payer: Self-pay | Admitting: Neurology

## 2022-02-23 NOTE — Telephone Encounter (Signed)
Pt is calling. Stated she is having a reaction from medication topiramate (TOPAMAX) 100 MG tablet. Pt stated medication is making her sick and she can't take her vitamins. Stated she is having blur vision. Pt is requesting a call-back from the nurse.

## 2022-02-24 ENCOUNTER — Other Ambulatory Visit (HOSPITAL_COMMUNITY): Payer: Self-pay | Admitting: Hematology

## 2022-02-24 DIAGNOSIS — R928 Other abnormal and inconclusive findings on diagnostic imaging of breast: Secondary | ICD-10-CM

## 2022-02-25 ENCOUNTER — Telehealth (INDEPENDENT_AMBULATORY_CARE_PROVIDER_SITE_OTHER): Payer: Medicare Other | Admitting: Neurology

## 2022-02-25 ENCOUNTER — Encounter: Payer: Self-pay | Admitting: Neurology

## 2022-02-25 ENCOUNTER — Telehealth: Payer: Self-pay | Admitting: Neurology

## 2022-02-25 DIAGNOSIS — R519 Headache, unspecified: Secondary | ICD-10-CM

## 2022-02-25 DIAGNOSIS — G40109 Localization-related (focal) (partial) symptomatic epilepsy and epileptic syndromes with simple partial seizures, not intractable, without status epilepticus: Secondary | ICD-10-CM | POA: Diagnosis not present

## 2022-02-25 DIAGNOSIS — G8929 Other chronic pain: Secondary | ICD-10-CM

## 2022-02-25 MED ORDER — LEVETIRACETAM 750 MG PO TABS: 750.0000 mg | ORAL_TABLET | Freq: Two times a day (BID) | ORAL | 11 refills | Status: DC

## 2022-02-25 NOTE — Progress Notes (Signed)
PATIENT: Wendy Lopez DOB: May 30, 1960  REASON FOR VISIT: follow up HISTORY FROM: patient Primary Neurologist: Dr. Jannifer Franklin  Today 02/25/22 Patient was called for follow up. At last visit, plan was to switch Dilantin to Topiramate. She reports doing the switch but she cannot tolerate the Topiramate. Topamax gave her side effects including: Dizziness, tingling and hands, bitter taste, cannot eat anything, soda tastes really bad. She is still on Keppra 500 mg BID.  In the past she had try Depakote but had a really severe allergic reaction. She also had an allergy to Carbamazepine.  She denies any seizure or seizure like activities and reports that her headaches have gone away since starting the Topiramate. She is worried headaches will come back if she discontinued the Topiramate.   INTERVAL HISTORY 12/08/21 Wendy Lopez presents today for follow-up, last visit was in February.  At that time, due to side effect of Keppra we decided to switch over to Briviact.  The medication was approved by her insurance but her co-pay was almost $500 a month which she could not afford.  She was put back on the Keppra and Dilantin.  Denies any additional seizures but still experience the side effect.  She would like one day to come off Keppra and Dilantin if possible.  Her current complaint at the moment is worsening migraines.  In the past she was put on Trokendi but reported have side effect, Trokendi made her migraine worse. She is willing to try it again (Topamax)   INTERVAL HISTORY 06/09/2021: Patient present today for follow-up, last visit was in July.  At that time she reported doing well on Keppra and Dilantin, no seizures.  She reported in January she was diagnosed with infection and was put on Levaquin, while on Levaquin she had 2 of her typical seizure with the aura, no convulsions.  Patient report with the [place] she had personality changes, she cannot focus, cannot concentrate she is irritable and would  like to switch medication.  Discussed about other type of medication but she has allergy to the failure on general medication.  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   INTERVAL HISTORY 10/26/2020 Wendy Lopez is a 61 year old female with history of seizures and migraine headaches. Had dizziness last year, MRI of the brain showed few white matter lesions, nonspecific, could be related to migraine history. Requires brand name medications, highly allergic to filler in generic medications, on Dilantin, but generic Keppra. Took brand name Trokendi for headaches, reports facial swelling, breaking out, tremors, stopped it. Had headaches up until March, gradually continues to improve. Last week had migraine, has vomiting with migraines, doesn't want more medications due to high sensitivity. Had Dilantin level checked 10/20/20, Dilantin level 8.4. Claims pre-seizure feeling last week, foggy, sensation/squeezing in neck and chest. 4 grand mal seizures in her life, most have been partial. Wonders if brand name medication Dilantin has been changed to account for recent feeling. Here today alone.   Reviewed blood work from PCP collected October 20, 2020: Dilantin level was 8.4, Keppra level was 23.7, CMP was normal, CBC showed slightly elevated HCT 47.2, platelet level mildly low 136, TSH 1.08.  Update 03/02/2020 Wendy Lopez is a 61 year old female with history of seizures, last seizure was in August 2020, she had awakened and had bitten her tongue, her Dilantin was increased.  She is on Dilantin and Keppra.  She is highly allergic to, filler in generic medications,  but is tolerating generic Keppra, has found a specific manufacturer (Lupen) works well for her.  Today, reports return of her migraines, that she was experiencing her 30s, also dizziness (floaty sensation), hard to say exactly when it started, but probably around May.  Headache is bilateral, pounding,  photophobia, phonophobia, nausea, occasional vomiting.  Reports daily headache, usually not in the morning, could be anytime throughout the day.  Unable to identify triggers.  Headache and dizziness are related.  Taking daily Excedrin Migraine.  Usually dizziness is brought on by movement on the screen (TV/phone), pending appointment with ophthalmology.  Has seen PCP, CT head in October was unremarkable.  Since June, noted decreased sensation/numbness to her right lower face, no facial droop or slurred speech.  Has had several falls related to dizziness.  No reported changes in medications.  Has seen cardiology, heart monitor is pending.  Had Covid vaccine 2nd dose May 10th, that night, had fever 103, shaking, had dj vu spell, which can be associated with seizure.  Had a tremor in both hands for 8 weeks, that went away.  Presents today for evaluation unaccompanied.  HISTORY 06/05/2019 SS: Wendy Lopez is a 61 year old female with history of seizures.  When last seen, she had likely seizure episode, when she had awakened and had bitten her tongue.  Her Dilantin level was increased with a 30 mg capsule, she remains on Keppra.  She indicates since increasing her dose of Dilantin, she is actually feeling a lot better, has been having more energy, she is very pleased.  She is taking generic Keppra, has a Licensed conveyancer (Lupen) that works for her.  She says she is highly allergic to a common filler in generic medications.  Her allergy has been so significant, at times has had to use EpiPen.  She has not had recurrent seizure.  She is working with pharmacy, unsure, how much more medication she will be able to receive from this manufacturer.  In the past, she has been on brand-name Keppra, but insurance did not pay.  She presents today for follow-up unaccompanied.   REVIEW OF SYSTEMS: Out of a complete 14 system review of symptoms, the patient complains only of the following symptoms, and all other reviewed  systems are negative.  See HPI  ALLERGIES: Allergies  Allergen Reactions   Carbamazepine Anaphylaxis   Lamictal [Lamotrigine] Anaphylaxis    SOB, Lamotrigine only   Other     STATES HAS SHORTNESS OF BREATH WITH GENERIC DRUGS     ALSO RASH AND ITCHING   Prednisone Shortness Of Breath, Rash and Other (See Comments)   Pseudoephedrine Hcl Er Other (See Comments)    SEIZURES.   Valium Other (See Comments)    Hallucinations, spasticity, hyper-relfexive    Amlodipine Rash    Face swell   Oxycodone Nausea And Vomiting and Rash   Ibuprofen Swelling   Percocet [Oxycodone-Acetaminophen] Nausea And Vomiting   Trokendi [place] Er] Other (See Comments)    Made headache worse, dizziness, difficulty breathing, rash   Betadine [Povidone Iodine] Rash   Latex Dermatitis and Rash   Shellfish Allergy Swelling    HOME MEDICATIONS: Outpatient Medications Prior to Visit  Medication Sig Dispense Refill   albuterol (VENTOLIN HFA) 108 (90 Base) MCG/ACT inhaler 2 puffs every 6 (six) hours as needed for wheezing or shortness of breath.     aspirin-acetaminophen-caffeine (EXCEDRIN MIGRAINE) 250-250-65 MG tablet Take 1 tablet by mouth every 8 (eight) hours as needed for headache.  Cyanocobalamin (VITAMIN B12) 1000 MCG TBCR Take 1,000 mcg by mouth in the morning.     diltiazem (CARDIZEM) 30 MG tablet Take 30 mg by mouth 3 (three) times daily as needed (heart palpitations/increased heart rates).     EPINEPHrine 0.3 mg/0.3 mL IJ SOAJ injection Inject 0.3 mg into the muscle as needed for anaphylaxis.     meclizine (ANTIVERT) 25 MG tablet Take 25 mg by mouth 3 (three) times daily as needed for dizziness.     metoprolol succinate (TOPROL-XL) 100 MG 24 hr tablet TAKE 1 TABLET(100 MG) BY MOUTH DAILY WITH OR IMMEDIATELY FOLLOWING A MEAL 90 tablet 3   Misc. Devices MISC Please provide patient with right breast prosthesis and 6 mastectomy bras sue to a history of right breast cancer and mastectomy. (ICD10-  C50.111) 6 each 0   sodium chloride (OCEAN) 0.65 % nasal spray Place 1-2 sprays into the nose as needed for congestion.     Tiotropium Bromide-Olodaterol (STIOLTO RESPIMAT) 2.5-2.5 MCG/ACT AERS Inhale 2 puffs into the lungs daily. 4 g 5   Vitamin D, Ergocalciferol, (DRISDOL) 1.25 MG (50000 UNIT) CAPS capsule Take 1 capsule (50,000 Units total) by mouth 2 (two) times a week. 10 capsule 11   ZYRTEC ALLERGY 10 MG tablet Take 10 mg by mouth at bedtime.     levETIRAcetam (KEPPRA) 500 MG tablet Take 1 tablet (500 mg total) by mouth 2 (two) times daily. 120 tablet 11   topiramate (TOPAMAX) 100 MG tablet Take 1 tablet (100 mg total) by mouth 2 (two) times daily. 60 tablet 3   No facility-administered medications prior to visit.    PAST MEDICAL HISTORY: Past Medical History:  Diagnosis Date   Anxiety and depression    Arthritis    Breast cancer ([place]) 02/13/2009   Cancer of right breast ([place]) 04/16/2012   Depression    Depression 2014   Dysrhythmia    palpitations   Epilepsy ([place])    last seizure 1990's   H/O hiatal hernia    Localization-related (focal) (partial) epilepsy and epileptic syndromes with complex partial seizures, without mention of intractable epilepsy 02/07/2013   Memory deficit 02/07/2013   Memory loss    Mitral valve prolapse 06/01/10   echo- EF>55% mild tricupsid regurgitation. There is mild Pulmonary Hypertension.. Right  ventricular systolic pressure iselevated at 30-40 mmHg. LV systolic function is normal.   Osteopenia    PONV (postoperative nausea and vomiting)    SOB (shortness of breath) 10/18/11   met-test   Spinal fusion failure    3    PAST SURGICAL HISTORY: Past Surgical History:  Procedure Laterality Date   BACK SURGERY     screws- lumbar   BREAST SURGERY  2010   mastectomy - RIGHT   BRONCHIAL WASHINGS  04/01/2021   Procedure: BRONCHIAL WASHINGS;  Surgeon: Garner Nash, DO;  Location: [facility];  Service: Cardiopulmonary;;   COLONOSCOPY WITH  PROPOFOL  03/13/2012   Procedure: COLONOSCOPY WITH PROPOFOL;  Surgeon: Garlan Fair, MD;  Location: [facility];  Service: Endoscopy;  Laterality: N/A;   ESOPHAGOGASTRODUODENOSCOPY  03/13/2012   Procedure: ESOPHAGOGASTRODUODENOSCOPY (EGD);  Surgeon: Garlan Fair, MD;  Location: [facility];  Service: Endoscopy;  Laterality: N/A;  Reflux, Hiatal Hernia   HAND TENDON SURGERY  2012   tendons released in right hand    MASTECTOMY, RADICAL  02/13/2009   SPINE SURGERY  1999, 2003, 2005   TONSILLECTOMY     VIDEO BRONCHOSCOPY Bilateral 04/01/2021   Procedure: VIDEO BRONCHOSCOPY WITHOUT  FLUORO;  Surgeon: Garner Nash, DO;  Location: [facility];  Service: Cardiopulmonary;  Laterality: Bilateral;  Ultrathin bronchoscopy    FAMILY HISTORY: Family History  Problem Relation Age of Onset   Arthritis Mother        rhuematoid   Heart disease Mother        Atrial fib   Heart disease Father    Heart disease Sister        Atrial fib    SOCIAL HISTORY: Social History   Socioeconomic History   Marital status: Married    Spouse name: Not on file   Number of children: 1   Years of education: college 2   Highest education level: Not on file  Occupational History   Occupation: unemployed    Employer: UNEMPLOYED    Comment: 2003  Tobacco Use   Smoking status: Every Day    Packs/day: 1.00    Years: 43.00    Total pack years: 43.00    Types: Cigarettes   Smokeless tobacco: Never   Tobacco comments:    1/2 pk per day  Substance and Sexual Activity   Alcohol use: No    Alcohol/week: 0.0 standard drinks of alcohol   Drug use: No   Sexual activity: Not on file  Other Topics Concern   Not on file  Social History Narrative   Patient lives at home with husband   Patient is right handed.   Patient does not drink caffeine.   Social Determinants of Health   Financial Resource Strain: Not on file  Food Insecurity: Not on file  Transportation Needs: Not on file  Physical  Activity: Not on file  Stress: Not on file  Social Connections: Not on file  Intimate Partner Violence: Not on file   PHYSICAL EXAM  There were no vitals filed for this visit.   There is no height or weight on file to calculate BMI.  Generalized: Well developed, in no acute distress   Neurological examination  Mentation: Alert oriented to time, place, history taking. Follows all commands speech and language fluent  DIAGNOSTIC DATA (LABS, IMAGING, TESTING) - I reviewed patient records, labs, notes, testing and imaging myself where available.  Lab Results  Component Value Date   WBC 7.3 01/28/2022   HGB 16.3 (H) 01/28/2022   HCT 46.9 (H) 01/28/2022   MCV 89.5 01/28/2022   PLT 158 01/28/2022      Component Value Date/Time   NA 141 01/28/2022 1005   NA 143 03/02/2020 0917   K 3.9 01/28/2022 1005   CL 110 01/28/2022 1005   CO2 24 01/28/2022 1005   GLUCOSE 92 01/28/2022 1005   BUN 13 01/28/2022 1005   BUN 11 03/02/2020 0917   CREATININE 0.81 01/28/2022 1005   CALCIUM 8.7 (L) 01/28/2022 1005   PROT 6.7 01/28/2022 1005   PROT 6.3 03/02/2020 0917   ALBUMIN 3.8 01/28/2022 1005   ALBUMIN 4.1 03/02/2020 0917   AST 16 01/28/2022 1005   ALT 15 01/28/2022 1005   ALKPHOS 112 01/28/2022 1005   BILITOT 0.8 01/28/2022 1005   BILITOT <0.2 03/02/2020 0917   GFRNONAA >60 01/28/2022 1005   GFRAA 110 03/02/2020 0917   Lab Results  Component Value Date   CHOL 170 02/17/2017   HDL 70 02/17/2017   LDLCALC 87 02/17/2017   TRIG 65 02/17/2017   CHOLHDL 2.4 02/17/2017   No results found for: "HGBA1C" Lab Results  Component Value Date   VITAMINB12 176 (L) 01/28/2022  Lab Results  Component Value Date   TSH 0.842 04/06/2018    ASSESSMENT AND PLAN 61 y.o. year old female  has a past medical history of Anxiety and depression, Arthritis, Breast cancer ([place]) (02/13/2009), Cancer of right breast ([place]) (04/16/2012), Depression, Depression (2014), Dysrhythmia, Epilepsy ([place]), H/O  hiatal hernia, Localization-related (focal) (partial) epilepsy and epileptic syndromes with complex partial seizures, without mention of intractable epilepsy (02/07/2013), Memory deficit (02/07/2013), Memory loss, Mitral valve prolapse (06/01/10), Osteopenia, PONV (postoperative nausea and vomiting), SOB (shortness of breath) (10/18/11), and Spinal fusion failure. here with:  1.  History of seizures 2.  Headache   Patient Instructions  Discontinue Topiramate  Increase Keppra to 750 mg twice daily  If headaches return, we can try low dose Topiramate 25 mg nightly  Follow up as schedule, December 6, at that time, we will obtain a Keppra level   Virtual Visit via Video Note  I connected with  [name] on 02/25/22 at 10:15 AM EST by a video enabled telemedicine application and verified that I am speaking with the correct person using two identifiers.  Location: Patient: Home  Provider: [place] Office   I discussed the limitations of evaluation and management by telemedicine and the availability of in person appointments. The patient expressed understanding and agreed to proceed.   I discussed the assessment and treatment plan with the patient. The patient was provided an opportunity to ask questions and all were answered. The patient agreed with the plan and demonstrated an understanding of the instructions.   The patient was advised to call back or seek an in-person evaluation if the symptoms worsen or if the condition fails to improve as anticipated.  I provided 20 minutes of non-face-to-face time during this encounter.  I have spent a total of 20 minutes dedicated to this patient today, preparing to see patient, performing a medically appropriate examination and evaluation, ordering tests and/or medications and procedures, and counseling and educating the patient/family/caregiver; independently interpreting result and communicating results to the family/patient/caregiver; and  documenting clinical information in the electronic medical record.   Alric Ran, MD 02/25/2022, 10:57 AM  [facility] [address], [place], [place] [address] [phone]    Alric Ran, MD 02/25/2022, 10:57 AM [facility] [address], [place], [place] [address] [phone]

## 2022-02-25 NOTE — Patient Instructions (Addendum)
Discontinue Topiramate  Increase Keppra to 750 mg twice daily  If headaches return, we can try low dose Topiramate 25 mg nightly  Follow up as schedule, December 6, at that time, we will obtain a Keppra level

## 2022-02-25 NOTE — Telephone Encounter (Signed)
Called pt to remind of MyChart Video Visit at 10:15a. Pt said in virtual waiting room.  Informed pt to  stay until Dr.Camara gets on.

## 2022-03-08 ENCOUNTER — Ambulatory Visit (HOSPITAL_COMMUNITY)
Admission: RE | Admit: 2022-03-08 | Discharge: 2022-03-08 | Disposition: A | Discharge status: Home / Self Care | Payer: Medicare Other | Source: Ambulatory Visit | Attending: Hematology | Admitting: Hematology

## 2022-03-08 DIAGNOSIS — R922 Inconclusive mammogram: Secondary | ICD-10-CM | POA: Diagnosis not present

## 2022-03-08 DIAGNOSIS — R928 Other abnormal and inconclusive findings on diagnostic imaging of breast: Secondary | ICD-10-CM

## 2022-03-15 ENCOUNTER — Encounter (HOSPITAL_COMMUNITY): Payer: Medicare Other

## 2022-03-15 ENCOUNTER — Ambulatory Visit (HOSPITAL_COMMUNITY): Payer: Medicare Other

## 2022-03-21 ENCOUNTER — Ambulatory Visit: Payer: Medicare Other | Admitting: Neurology

## 2022-03-23 ENCOUNTER — Ambulatory Visit: Payer: Medicare Other | Admitting: Neurology

## 2022-04-28 ENCOUNTER — Ambulatory Visit: Payer: Medicare Other | Attending: Cardiovascular Disease | Admitting: Cardiovascular Disease

## 2022-04-28 ENCOUNTER — Encounter: Payer: Self-pay | Admitting: Cardiovascular Disease

## 2022-04-28 VITALS — BP 120/78 | HR 69 | Ht 62.0 in | Wt 136.6 lb

## 2022-04-28 DIAGNOSIS — I10 Essential (primary) hypertension: Secondary | ICD-10-CM | POA: Diagnosis not present

## 2022-04-28 DIAGNOSIS — E559 Vitamin D deficiency, unspecified: Secondary | ICD-10-CM

## 2022-04-28 DIAGNOSIS — R002 Palpitations: Secondary | ICD-10-CM

## 2022-04-28 DIAGNOSIS — G40909 Epilepsy, unspecified, not intractable, without status epilepticus: Secondary | ICD-10-CM | POA: Diagnosis not present

## 2022-04-28 DIAGNOSIS — Z720 Tobacco use: Secondary | ICD-10-CM

## 2022-04-28 DIAGNOSIS — E538 Deficiency of other specified B group vitamins: Secondary | ICD-10-CM | POA: Diagnosis not present

## 2022-04-28 DIAGNOSIS — Z79899 Other long term (current) drug therapy: Secondary | ICD-10-CM | POA: Diagnosis not present

## 2022-04-28 DIAGNOSIS — C50111 Malignant neoplasm of central portion of right female breast: Secondary | ICD-10-CM | POA: Diagnosis not present

## 2022-04-28 DIAGNOSIS — E785 Hyperlipidemia, unspecified: Secondary | ICD-10-CM | POA: Diagnosis not present

## 2022-04-28 DIAGNOSIS — Z170 Estrogen receptor positive status [ER+]: Secondary | ICD-10-CM | POA: Diagnosis not present

## 2022-04-28 NOTE — Patient Instructions (Addendum)
Medication Instructions:  Your physician recommends that you continue on your current medications as directed. Please refer to the Current Medication list given to you today.  *If you need a refill on your cardiac medications before your next appointment, please call your pharmacy*   Lab Work: Your physician recommends that you return at your your earliest convenience to have the following labs drawn: CMET, Lipids, TSH, LP(a), Vitamin D, and Vitamin B12   If you have labs (blood work) drawn today and your tests are completely normal, you will receive your results only by: [place] (if you have MyChart) OR A paper copy in the mail If you have any lab test that is abnormal or we need to change your treatment, we will call you to review the results.   Testing/Procedures: Dr. Shelva Majestic has ordered a CT coronary calcium score.   Test locations:  [place]   This is $99 out of pocket.   Coronary CalciumScan A coronary calcium scan is an imaging test used to look for deposits of calcium and other fatty materials (plaques) in the inner lining of the blood vessels of the heart (coronary arteries). These deposits of calcium and plaques can partly clog and narrow the coronary arteries without producing any symptoms or warning signs. This puts a person at risk for a heart attack. This test can detect these deposits before symptoms develop. Tell a health care provider about: Any allergies you have. All medicines you are taking, including vitamins, herbs, eye drops, creams, and over-the-counter medicines. Any problems you or family members have had with anesthetic medicines. Any blood disorders you have. Any surgeries you have had. Any medical conditions you have. Whether you are pregnant or may be pregnant. What are the risks? Generally, this is a safe procedure. However, problems may occur, including: Harm to a pregnant woman and her unborn baby. This  test involves the use of radiation. Radiation exposure can be dangerous to a pregnant woman and her unborn baby. If you are pregnant, you generally should not have this procedure done. Slight increase in the risk of cancer. This is because of the radiation involved in the test. What happens before the procedure? No preparation is needed for this procedure. What happens during the procedure? You will undress and remove any jewelry around your neck or chest. You will put on a hospital gown. Sticky electrodes will be placed on your chest. The electrodes will be connected to an electrocardiogram (ECG) machine to record a tracing of the electrical activity of your heart. A CT scanner will take pictures of your heart. During this time, you will be asked to lie still and hold your breath for 2-3 seconds while a picture of your heart is being taken. The procedure may vary among health care providers and hospitals. What happens after the procedure? You can get dressed. You can return to your normal activities. It is up to you to get the results of your test. Ask your health care provider, or the department that is doing the test, when your results will be ready. Summary A coronary calcium scan is an imaging test used to look for deposits of calcium and other fatty materials (plaques) in the inner lining of the blood vessels of the heart (coronary arteries). Generally, this is a safe procedure. Tell your health care provider if you are pregnant or may be pregnant. No preparation is needed for this procedure. A CT scanner will take pictures of your heart.  You can return to your normal activities after the scan is done. This information is not intended to replace advice given to you by your health care provider. Make sure you discuss any questions you have with your health care provider. Document Released: 10/01/2007 Document Revised: 02/22/2016 Document Reviewed: 02/22/2016 Elsevier Interactive Patient  Education  2017 [place]: At [facility], you and your health needs are our priority.  As part of our continuing mission to provide you with exceptional heart care, we have created designated Provider Care Teams.  These Care Teams include your primary Cardiologist (physician) and Advanced Practice Providers (APPs -  Physician Assistants and Nurse Practitioners) who all work together to provide you with the care you need, when you need it.  We recommend signing up for the patient portal called "MyChart".  Sign up information is provided on this After Visit Summary.  MyChart is used to connect with patients for Virtual Visits (Telemedicine).  Patients are able to view lab/test results, encounter notes, upcoming appointments, etc.  Non-urgent messages can be sent to your provider as well.   To learn more about what you can do with MyChart, go to [email or web address].    Your next appointment:   6 month(s)  Provider:   Shelva Majestic, MD

## 2022-04-28 NOTE — Progress Notes (Signed)
Patient ID: [name], female   DOB: 1960/06/29, 62 y.o.   MRN: [id number]       HPI: YOLANDA HUFFSTETLER is a 62 y.o. female who presents for a 81 month cardiology follow-up evaluation.   Ms. Tonche has a long-standing history of tobacco abuse and started smoking at age 56.  In 2009 cardiac catheterization revealed normal coronary arteries. In February 2012 a nuclear perfusion study was done after she experienced episodes of chest pain and shortness of breath and this revealed normal perfusion. An echo Doppler study demonstrated mild pulmonary hypertension with estimated RV systolic pressure of 36 mm. Because of issues of recurrent shortness of breath she underwent a cardiopulmonary met test and was found to have a blunted chronotropic response to exercise which is making it difficult for her to improve her endurance and exercise capacity. She has a history of tachypalpitations. She has a history of invasive ductal carcinoma and is status post right mastectomy with sentinel node biopsy and is status post chemotherapy. She has a history of vitamin D insufficiency mild blood pressure elevation. There is also a history of depression.  On 10/16/2012 , a 2-D echo Doppler study  revealed an ejection fraction in the range of 60-65%. She did not have wall motion abnormalities and had normal diastolic function. There was evidence for right ventricular hypertrophy with normal RV function. She again had mild pulmonary hypertension with an estimated pressure 39 mm. There is moderate tricuspid regurgitation. Ms. Vetsch continues to smoke cigarettes. She does note some indigestion. He also has noticed some left leg weakness and has issues with L4-L5 disc disease. She previously was on a higher dose of present Brintellex but due to tremors she has reduced his dose to 5 mg daily.  A subsequent nuclear perfusion study was done on 11/20/2012 after her ECG revealed slight additional T-wave abnormalities and was  essentially normal without wall motion abnormality, scar or ischemia.  She has a history of cervical disc disease and had noted some upper back discomfort in the past.  In December 2015, her EKG had shown  more pronounced downsloping ST segment depression in the inferior to inferolateral leads.  I recommended a subsequent nuclear perfusion study.  This was done on 04/17/2014 , which was not significant change from her previous study.  She again had basal inferolateral ST-T changes with T-wave inversion in V3 through V4 which did not significant change with stress.  She had normal perfusion with post-rest ejection fraction at 65%.  When seen in 2016 she was smoking one half pack of cigarettes per day.  She had had medication issues and was on Keppra in place of Lamictal for seizures due to cost. Recently, she  had some issue with metoprolol succinate.  She had self reduced this to 100 mg per day.  When I saw her several months ago.  She was wanting to switch to a shorter acting version, however, noted more palpitations on the tartrate preparation. As result, she put herself back on long-acting Toprol.  I saw her in October 2018 and over the 2 years previously she was  without anginal symptoms.  She quit smoking in December 2017, but unfortunately resumed during the period of increased stress 4 months later.  Unfortunately she was still smoking cigarettes.  She was no longer having caffeine or chocolate.  She denies any exertional chest pain.  Her palpitations have been fairly well controlled on Toprol 200 mg daily.  She denied seizure activity and is  on Keppra and Dilantin.    She has undergone several additional evaluations with Kathyrn Drown, NP and Almyra Deforest, [place] in November and December 2019.  She had experienced intermittent midsternal chest pain/tightness without radiation which she described to Kathyrn Drown, NP on 02/16/2018.  She subsequently underwent a Lexiscan Myoview which was normal.  EF was  greater than 65%.  Because of potential lower extremity claudication issues on March 06, 2018 lower semi-Doppler imaging revealed normal ABIs bilaterally.  An echo Doppler study November 2019 showed an EF of 55 to 60% without wall motion abnormalities.  She had a mildly thickened mitral valve without frank prolapse.  She was seen by Almyra Deforest, PA in follow-up.  She also underwent nerve conduction studies by Dr. Floyde Parkins and she does not have peripheral neuropathy.  She continues to have issues with her neck and since Dr. Carloyn Manner has retired she is now seeing Dr. Princess Bruins.  Unfortunately she still smoking 1/2 to 1 pack/day.  She admits to occasional palpitations at night with heart rates going up into the 110 range.  Apparently she had had a home study for evaluation of potential sleep apnea by Dr. Deforest Hoyles.  I last saw her in February 08, 2019.  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  She wore an event monitor from July 30 through November 28, 2018.  She was in sinus rhythm with an average rate at 70 bpm.  The fastest heart rate was sinus tachycardia at 112 bpm.  She did not have any episodes of atrial fibrillation, SVT, or prolonged bradycardia or ectopy.  She continues to smoke cigarettes.  She does undergo yearly low-dose CT imaging for lung cancer screening.   She has been evaluated by Kerin Ransom on several occasions, most recently in November 2021.  She had worn a cardiac monitor from October 12 through February 11, 2020.  The predominant rhythm was sinus with an average rate of 81 bpm.  The slowest heart rate was sinus bradycardia 54 bpm.  There was a 6 beat burst of SVT at 176 bpm and there were a total of 21 short-lived episodes of SVT with the longest lasting 15 beats at an average rate at 103 bpm.  There were rare PACs, atrial couplets and triplets less than 1% and there were rare isolated PVCs less  than 1%.  She was evaluated by [facility] neurology, Butler Denmark, NP in July 2022 for her seizures.  She had Dr. Jannifer Franklin for her neurologist who is recently retired.  I last saw her on January 27, 2021.  She apparently had a short-lived recent seizure 1 week ago and has required brand-name medications and is on Dilantin extended release 100 mg 3 times a day, Keppra 500 mg in the morning and 1000 mg at night and has been on metoprolol succinate 100 mg daily.  She is on sertraline 10 mg.  She denied chest pain orrecent palpitations.  Unfortunately she still smokes.  At that time, her resting pulse was well-controlled on metoprolol 100 mg daily.  I again discussed the importance of complete discontinuance of tobacco.  She undergoes yearly low-dose chest CTA's in light of her longstanding tobacco history and she continues to see Dr. Delton Coombes for oncology follow-up.  Since I last saw her, she states she smokes approximately 1 pack/week.  She has not had recent seizures.  She continues to be on Keppra.  She is taking metoprolol succinate 100 mg daily.  She is followed by Dr. Valeta Harms of pulmonary and is on Stiolto Respimat.  She denies chest tightness.  She sees Dr. Deforest Hoyles for primary care at [facility].  She has documented aortic atherosclerosis.  She has not had recent laboratory.  She presents for evaluation.   Past Medical History:  Diagnosis Date   Anxiety and depression    Arthritis    Breast cancer ([place]) 02/13/2009   Cancer of right breast ([place]) 04/16/2012   Depression    Depression 2014   Dysrhythmia    palpitations   Epilepsy ([place])    last seizure 1990's   H/O hiatal hernia    Localization-related (focal) (partial) epilepsy and epileptic syndromes with complex partial seizures, without mention of intractable epilepsy 02/07/2013   Memory deficit 02/07/2013   Memory loss    Mitral valve prolapse 06/01/10   echo- EF>55% mild tricupsid regurgitation. There is mild Pulmonary Hypertension.. Right   ventricular systolic pressure iselevated at 30-40 mmHg. LV systolic function is normal.   Osteopenia    PONV (postoperative nausea and vomiting)    SOB (shortness of breath) 10/18/11   met-test   Spinal fusion failure    3    Past Surgical History:  Procedure Laterality Date   BACK SURGERY     screws- lumbar   BREAST SURGERY  2010   mastectomy - RIGHT   BRONCHIAL WASHINGS  04/01/2021   Procedure: BRONCHIAL WASHINGS;  Surgeon: Garner Nash, DO;  Location: [facility];  Service: Cardiopulmonary;;   COLONOSCOPY WITH PROPOFOL  03/13/2012   Procedure: COLONOSCOPY WITH PROPOFOL;  Surgeon: Garlan Fair, MD;  Location: [facility];  Service: Endoscopy;  Laterality: N/A;   ESOPHAGOGASTRODUODENOSCOPY  03/13/2012   Procedure: ESOPHAGOGASTRODUODENOSCOPY (EGD);  Surgeon: Garlan Fair, MD;  Location: [facility];  Service: Endoscopy;  Laterality: N/A;  Reflux, Hiatal Hernia   HAND TENDON SURGERY  2012   tendons released in right hand    MASTECTOMY, RADICAL  02/13/2009   SPINE SURGERY  1999, 2003, 2005   TONSILLECTOMY     VIDEO BRONCHOSCOPY Bilateral 04/01/2021   Procedure: VIDEO BRONCHOSCOPY WITHOUT FLUORO;  Surgeon: Garner Nash, DO;  Location: [facility];  Service: Cardiopulmonary;  Laterality: Bilateral;  Ultrathin bronchoscopy    Allergies  Allergen Reactions   Carbamazepine Anaphylaxis   Lamictal [Lamotrigine] Anaphylaxis    SOB, Lamotrigine only   Other     STATES HAS SHORTNESS OF BREATH WITH GENERIC DRUGS     ALSO RASH AND ITCHING   Prednisone Shortness Of Breath, Rash and Other (See Comments)   Pseudoephedrine Hcl Er Other (See Comments)    SEIZURES.   Valium Other (See Comments)    Hallucinations, spasticity, hyper-relfexive    Amlodipine Rash    Face swell   Oxycodone Nausea And Vomiting and Rash   Ibuprofen Swelling   Percocet [Oxycodone-Acetaminophen] Nausea And Vomiting   Trokendi Kellogg Er] Other (See Comments)    Made headache worse,  dizziness, difficulty breathing, rash   Betadine [Povidone Iodine] Rash   Latex Dermatitis and Rash   Shellfish Allergy Swelling    Current Outpatient Medications  Medication Sig Dispense Refill   albuterol (VENTOLIN HFA) 108 (90 Base) MCG/ACT inhaler 2 puffs every 6 (six) hours as needed for wheezing or shortness of breath.     aspirin-acetaminophen-caffeine (EXCEDRIN MIGRAINE) 250-250-65 MG tablet Take 1 tablet by mouth every 8 (eight) hours as needed for headache.     Cyanocobalamin (  VITAMIN B12) 1000 MCG TBCR Take 1,000 mcg by mouth in the morning.     diltiazem (CARDIZEM) 30 MG tablet Take 30 mg by mouth 3 (three) times daily as needed (heart palpitations/increased heart rates).     EPINEPHrine 0.3 mg/0.3 mL IJ SOAJ injection Inject 0.3 mg into the muscle as needed for anaphylaxis.     levETIRAcetam (KEPPRA) 750 MG tablet Take 1 tablet (750 mg total) by mouth 2 (two) times daily. 60 tablet 11   meclizine (ANTIVERT) 25 MG tablet Take 25 mg by mouth 3 (three) times daily as needed for dizziness.     metoprolol succinate (TOPROL-XL) 100 MG 24 hr tablet TAKE 1 TABLET(100 MG) BY MOUTH DAILY WITH OR IMMEDIATELY FOLLOWING A MEAL 90 tablet 3   Misc. Devices MISC Please provide patient with right breast prosthesis and 6 mastectomy bras sue to a history of right breast cancer and mastectomy. (ICD10- C50.111) 6 each 0   sodium chloride (OCEAN) 0.65 % nasal spray Place 1-2 sprays into the nose as needed for congestion.     Tiotropium Bromide-Olodaterol (STIOLTO RESPIMAT) 2.5-2.5 MCG/ACT AERS Inhale 2 puffs into the lungs daily. 4 g 5   Vitamin D, Ergocalciferol, (DRISDOL) 1.25 MG (50000 UNIT) CAPS capsule Take 1 capsule (50,000 Units total) by mouth 2 (two) times a week. 10 capsule 11   ZYRTEC ALLERGY 10 MG tablet Take 10 mg by mouth at bedtime.     No current facility-administered medications for this visit.    Social History   Socioeconomic History   Marital status: Married    Spouse name:  Not on file   Number of children: 1   Years of education: college 2   Highest education level: Not on file  Occupational History   Occupation: unemployed    Employer: UNEMPLOYED    Comment: 2003  Tobacco Use   Smoking status: Every Day    Packs/day: 1.00    Years: 43.00    Total pack years: 43.00    Types: Cigarettes   Smokeless tobacco: Never   Tobacco comments:    1/2 pk per day  Substance and Sexual Activity   Alcohol use: No    Alcohol/week: 0.0 standard drinks of alcohol   Drug use: No   Sexual activity: Not on file  Other Topics Concern   Not on file  Social History Narrative   Patient lives at home with husband   Patient is right handed.   Patient does not drink caffeine.   Social Determinants of Health   Financial Resource Strain: Not on file  Food Insecurity: Not on file  Transportation Needs: Not on file  Physical Activity: Not on file  Stress: Not on file  Social Connections: Not on file  Intimate Partner Violence: Not on file    Social she is married and has one child. She has been smoking since age 40.  ROS General: Negative; No fevers, chills, or night sweats;  HEENT: Negative; No changes in vision or hearing, sinus congestion, difficulty swallowing Pulmonary: Negative; No cough, wheezing, shortness of breath, hemoptysis Cardiovascular: See HPI GI: Negative; No nausea, vomiting, diarrhea, or abdominal pain GU: Negative; No dysuria, hematuria, or difficulty voiding Musculoskeletal: Neck and leg discomfort Hematologic/Oncology: Remote history of stage I breast CA of the right breast with recent evaluation felt to be stable. Endocrine: Negative; no heat/cold intolerance; no diabetes Neuro: history of seizure disorder for which she had been on chronic Lamictal and Dilantin and was followed by Dr. Jannifer Franklin; previously  she had been followed by Dr. Erling Cruz.  She was switched to [place] due to cost since she could not take generic Lamictal.  Intermittent  paresthesias in her extremities   Skin: Negative; No rashes or skin lesions Psychiatric: Negative; No behavioral problems, depression Sleep: Fatigability:  No snoring, daytime sleepiness, hypersomnolence, bruxism, restless legs, hypnogognic hallucinations, no cataplexy Other comprehensive 14 point system review is negative.   PE BP 120/78 (BP Location: Left Arm, Patient Position: Sitting, Cuff Size: Normal)   Pulse 69   Ht '5\' 2"'$  (1.575 m)   Wt 136 lb 9.6 oz (62 kg)   LMP 11/03/1993   SpO2 98%   BMI 24.98 kg/m    Repeat blood pressure by me was 120/74  Wt Readings from Last 3 Encounters:  04/28/22 136 lb 9.6 oz (62 kg)  02/10/22 134 lb (60.8 kg)  12/08/21 137 lb (62.1 kg)   General: Alert, oriented, no distress.  Skin: normal turgor, no rashes, warm and dry HEENT: Normocephalic, atraumatic. Pupils equal round and reactive to light; sclera anicteric; extraocular muscles intact;  Nose without nasal septal hypertrophy Mouth/Parynx benign; Mallinpatti scale 2/3 Neck: No JVD, no carotid bruits; normal carotid upstroke Lungs: clear to ausculatation and percussion; no wheezing or rales Chest wall: without tenderness to palpitation Heart: PMI not displaced, RRR, s1 s2 normal, 1/6 systolic murmur, no diastolic murmur, no rubs, gallops, thrills, or heaves Abdomen: soft, nontender; no hepatosplenomehaly, BS+; abdominal aorta nontender and not dilated by palpation. Back: no CVA tenderness Pulses 2+ Musculoskeletal: full range of motion, normal strength, no joint deformities Extremities: no clubbing cyanosis or edema, Homan's sign negative  Neurologic: grossly nonfocal; Cranial nerves grossly wnl Psychologic: Normal mood and affect    April 28, 2021   ECG (independently read by me):  NSR at 69, inferolateral STT changes  January 27, 2021 ECG (independently read by me):  NSR at 61, LAE, QS V1-2, inferolateral ST changes  February 08, 2019 ECG (independently read by me): Normal sinus  rhythm at 66 bpm, the left atrial enlargement.  Previous inferior and anterolateral ST changes  November 07, 2018 ECG (independently read by me): Normal sinus rhythm at 69 bpm.  Possible left atrial enlargement.  Inferolateral T wave abnormality  October 2018 ECG (independently read by me): Normal sinus rhythm at 69 bpm.  Probable left atrial enlargement.  Inferolateral ST changes.  Normal intervals.  No ectopy.  June 2017 ECG (independently read by me): Normal sinus rhythm at 71 bpm.  Will biatrial enlargement.  Inferolateral ST-T wave abnormality.  Normal intervals.  March 2017 ECG (independently read by me):  Normal sinus rhythm at 77 bpm.  Biatrial enlargement.  Previously noted inferolateral T wave abnormalities.  November 2016 ECG (independently read by me): Normal sinus rhythm at 75 bpm.  Inferolateral ST-T wave abnormalities.  03/31/2014 ECG (independently read by me) : Normal sinus rhythm at 75 bpm.  There is now significantly more pronounced downsloping ST segment depression in leads II, III, and F V4 through V6 and T-wave inversion in V3 compared to her prior ECG of over one year ago.  Prior ECG: Normal sinus rhythm at 68. T-wave abnormalities  inferiorly in V3 through V6  LABS:      Latest Ref Rng & Units 01/28/2022   10:05 AM 03/08/2021    2:04 PM 12/02/2020   12:10 PM  BMP  Glucose 70 - 99 mg/dL 92   91   BUN 8 - 23 mg/dL 13   10  Creatinine 0.44 - 1.00 mg/dL 0.81  0.70  0.72   Sodium 135 - 145 mmol/L 141   137   Potassium 3.5 - 5.1 mmol/L 3.9   4.2   Chloride 98 - 111 mmol/L 110   103   CO2 22 - 32 mmol/L 24   28   Calcium 8.9 - 10.3 mg/dL 8.7   8.5        Latest Ref Rng & Units 01/28/2022   10:05 AM 12/02/2020   12:10 PM 03/02/2020    9:17 AM  Hepatic Function  Total Protein 6.5 - 8.1 g/dL 6.7  6.9  6.3   Albumin 3.5 - 5.0 g/dL 3.8  4.1  4.1   AST 15 - 41 U/L '16  20  14   '$ ALT 0 - 44 U/L '15  24  15   '$ Alk Phosphatase 38 - 126 U/L 112  113  139   Total Bilirubin  0.3 - 1.2 mg/dL 0.8  0.5  <0.2        Latest Ref Rng & Units 01/28/2022   10:05 AM 06/08/2021   10:38 AM 12/02/2020   12:10 PM  CBC  WBC 4.0 - 10.5 K/uL 7.3  7.0  7.7   Hemoglobin 12.0 - 15.0 g/dL 16.3  16.4  17.2   Hematocrit 36.0 - 46.0 % 46.9  48.4  49.4   Platelets 150 - 400 K/uL 158  148  166     Lab Results  Component Value Date   MCV 89.5 01/28/2022   MCV 90.0 06/08/2021   MCV 91.7 12/02/2020    Lab Results  Component Value Date   TSH 0.842 04/06/2018     Lab Results  Component Value Date   MCV 89.5 01/28/2022   MCV 90.0 06/08/2021   MCV 91.7 12/02/2020   Lab Results  Component Value Date   TSH 0.842 04/06/2018  No results found for: "HGBA1C"   Lipid Panel     Component Value Date/Time   CHOL 170 02/17/2017 0820   TRIG 65 02/17/2017 0820   HDL 70 02/17/2017 0820   CHOLHDL 2.4 02/17/2017 0820   LDLCALC 87 02/17/2017 0820    RADIOLOGY: No results found.  IMPRESSION:  1. Primary hypertension   2. Vitamin D deficiency   3. Vitamin B 12 deficiency   4. Hyperlipidemia, unspecified hyperlipidemia type   5. Malignant neoplasm of central portion of right breast in female, estrogen receptor positive (HCC)   6. Palpitations   7. Seizure disorder ([place])   8. Tobacco abuse   9. Medication management     ASSESSMENT AND PLAN: Ms. Jame Seelig is a 62 year old female who has a long-standing tobacco history. In January 2009  cardiac catheterization at the [facility] revealed normal coronary arteries. A nuclear perfusion study in 2012 showed normal perfusion. A cardiopulmonary met test in July 2013 showed blunted chronotropic response without ischemic changes but she had reduced functional status with peak O2 at 69% of predicted. She has mild pulmonary hypertension.  An echo revealed normal function without wall motion abnormalities despite her ECG changes.  A subsequent stress test in 2015 which was done because of more abnormal ST-T changes remain  stable. A repeat echo Doppler study in November 2016 was unchanged and showed an EF of 60-65%.  There was trivial TR and MR.  Pulmonary pressures were normal.  In November 2019 due to recurrent intermittent episodes of chest tightness a Lexiscan Myoview study continued to show normal perfusion and  was low risk.  She had been on Toprol-XL 100 mg twice a day with her history of increased heart rates.  Her event monitor from July 30 through November 28, 2018 revealed predominant sinus rhythm with average heart beat at 70 bpm.  There was mild sinus bradycardia which occurred during sleep.  Her fastest heart rate was sinus tachycardia at 112 bpm at 7 PM while active.  She did not have any episodes of AF, SVT ectopy or prolonged bradycardia.  Dr. Jannifer Franklin felt that her neuropathy most likely is due to her prior chemotherapy for which she had undergone for her breast cancer over 10 years ago.  A subsequent cardiac monitor in October 2021 showed predominant sinus rhythm but she had short-lived bursts of SVT with the fastest lasting 6 beats at a maximum rate of 176 bpm and the longest lasting 15 beats with an average rate of 103 bpm.  Most recently, she has been on a metoprolol succinate regimen of just 100 mg daily.  She is unaware of recurrent palpitations or tacky dysrhythmias.  She is unaware of recent seizure activity and remains on Keppra 750 mg twice a day.  She is no longer on Dilantin.  She has documented aortic atherosclerosis.  I am recommending follow-up laboratory with a comprehensive metabolic panel, lipid studies, TSH, LP(a), and will also check a vitamin D level and B12 levels which she states have been abnormal in the past.  With her continued tobacco history currently 1 pack/week I am recommending a baseline calcium score.  She is followed by Dr. Valeta Harms for pulmonary and continues to be on Stiolto Respimat and as needed albuterol and undergoes periodic CT imaging with her tobacco history.  I again stressed the  importance of complete smoking cessation.  She sees Dr. Delton Coombes for oncology follow-up.  I will see her in 6 months for cardiology reevaluation or sooner as needed.    Troy Sine, MD,  [facility]  05/08/2022 2:02 PM

## 2022-05-08 ENCOUNTER — Encounter: Payer: Self-pay | Admitting: Cardiovascular Disease

## 2022-06-14 ENCOUNTER — Ambulatory Visit (HOSPITAL_COMMUNITY)
Admission: RE | Admit: 2022-06-14 | Discharge: 2022-06-14 | Disposition: A | Discharge status: Home / Self Care | Payer: Self-pay | Source: Ambulatory Visit | Attending: Cardiovascular Disease | Admitting: Cardiovascular Disease

## 2022-06-14 ENCOUNTER — Other Ambulatory Visit (HOSPITAL_COMMUNITY)
Admission: RE | Admit: 2022-06-14 | Discharge: 2022-06-14 | Disposition: A | Discharge status: Home / Self Care | Payer: Medicare Other | Source: Ambulatory Visit | Attending: Cardiovascular Disease | Admitting: Cardiovascular Disease

## 2022-06-14 DIAGNOSIS — I10 Essential (primary) hypertension: Secondary | ICD-10-CM | POA: Insufficient documentation

## 2022-06-14 DIAGNOSIS — E785 Hyperlipidemia, unspecified: Secondary | ICD-10-CM | POA: Insufficient documentation

## 2022-06-14 LAB — COMPREHENSIVE METABOLIC PANEL
ALT: 17 U/L (ref 0–44)
AST: 18 U/L (ref 15–41)
Albumin: 3.9 g/dL (ref 3.5–5.0)
Alkaline Phosphatase: 78 U/L (ref 38–126)
Anion gap: 9 (ref 5–15)
BUN: 17 mg/dL (ref 8–23)
CO2: 25 mmol/L (ref 22–32)
Calcium: 8.9 mg/dL (ref 8.9–10.3)
Chloride: 103 mmol/L (ref 98–111)
Creatinine, Ser: 0.81 mg/dL (ref 0.44–1.00)
GFR, Estimated: >60 mL/min (ref >60)
Glucose, Bld: 100 mg/dL — ABNORMAL HIGH (ref 70–99)
Potassium: 3.6 mmol/L (ref 3.5–5.1)
Sodium: 137 mmol/L (ref 135–145)
Total Bilirubin: 0.9 mg/dL (ref 0.3–1.2)
Total Protein: 6.9 g/dL (ref 6.5–8.1)

## 2022-06-14 LAB — LIPID PANEL
Cholesterol: 216 mg/dL — ABNORMAL HIGH (ref 0–200)
HDL: 66 mg/dL (ref >40)
LDL Cholesterol: 136 mg/dL — ABNORMAL HIGH (ref 0–99)
Total CHOL/HDL Ratio: 3.3 RATIO
Triglycerides: 71 mg/dL (ref <150)
VLDL: 14 mg/dL (ref 0–40)

## 2022-06-14 LAB — VITAMIN B12: Vitamin B-12: 246 pg/mL (ref 180–914)

## 2022-06-14 LAB — VITAMIN D 25 HYDROXY (VIT D DEFICIENCY, FRACTURES): Vit D, 25-Hydroxy: 28.91 ng/mL — ABNORMAL LOW (ref 30–100)

## 2022-06-14 LAB — TSH: TSH: 1.625 u[IU]/mL (ref 0.350–4.500)

## 2022-06-16 ENCOUNTER — Telehealth: Payer: Self-pay

## 2022-06-16 ENCOUNTER — Telehealth: Payer: Self-pay | Admitting: Cardiovascular Disease

## 2022-06-16 LAB — LIPOPROTEIN A (LPA): Lipoprotein (a): 121.4 nmol/L — ABNORMAL HIGH (ref <75.0)

## 2022-06-16 NOTE — Telephone Encounter (Signed)
Spoke to patient regarding concerns about her elevated BP and c/o dizziness.  Patient has a history of dizziness and has meclizine prescribed to her. I asked if she had been using this and she said no as "this dizziness is different". She describes nausea, sinus pressure and lightheadedness when she's moving around which seems to improve with rest. She says these symptoms started a week ago when she had a vomiting episode at the grocery store.   Patient also provided the following additional info related to the blood pressures she reported.  06/16/22: 154/90 (before morning BP meds)  06/15/22: 138/93 (After morning BP meds)  06/14/22: 150/96 (before morning BP meds)  06/13/22: 159/92 (before morning BP meds)    156/26 (bedtime)   I also asked patient if she had used her albuterol today as she mentioned she is a current smoker. She states she has not used her albuterol inhaler in years and has no c/o SOB. I advised patient not to increase her metoprolol use until she hears from [place].

## 2022-06-16 NOTE — Telephone Encounter (Signed)
Pt c/o BP issue: STAT if pt c/o blurred vision, one-sided weakness or slurred speech  1. What are your last 5 BP readings?  154/90 - today 138/93 150/96 156/98 159/92   2. Are you having any other symptoms (ex. Dizziness, headache, blurred vision, passed out)?  Dizziness   3. What is your BP issue? Pt states she threw up last Thursday in the grocery store bp during that time was 156/125. Pt states she has increased her metoprolol from '100mg'$  to '150mg'$  in the AM and '100mg'$  in PM and its still high. Please advise.  She would also like her CT cardiac scoring results.

## 2022-06-16 NOTE — Telephone Encounter (Signed)
Scheduled patient with an appointment to see Diona Browner, APP regarding elevated BP concerns for tomorrow 06/17/22. Please see previous telephone note.

## 2022-06-17 ENCOUNTER — Other Ambulatory Visit: Payer: Self-pay

## 2022-06-17 ENCOUNTER — Telehealth: Payer: Self-pay | Admitting: Cardiovascular Disease

## 2022-06-17 ENCOUNTER — Ambulatory Visit: Payer: Medicare Other | Admitting: Nurse Practitioner

## 2022-06-17 MED ORDER — LOSARTAN POTASSIUM 25 MG PO TABS: 25.0000 mg | ORAL_TABLET | Freq: Every day | ORAL | 3 refills | Status: DC

## 2022-06-17 NOTE — Telephone Encounter (Signed)
Patient has a calcium score of 1.  She is on metoprolol already at high dose.  With blood pressure elevation consider adding losartan initially at 25 mg.  She has an appointment to see Florence Canner next week

## 2022-06-17 NOTE — Telephone Encounter (Signed)
Attempted to call back patient to discuss medication options.   Left call back number.

## 2022-06-17 NOTE — Telephone Encounter (Signed)
Patient returned call- she was advised of message from [facility] in regards to the Losartan.   Patient had taken 150 mg of Metoprolol in the morning , and 100 mg of Metoprolol at night.I advised this was a high dose of Metoprolol, recommended she go back to the original dosage of 100 mg Metoprolol daily, along with the addition of Losartan (sent RX to pharmacy) keep log of BP until visit on 03/11.   Patient verbalized understanding.

## 2022-06-17 NOTE — Telephone Encounter (Signed)
See previous telephone call.   Patient aware.  Thanks!

## 2022-06-17 NOTE — Telephone Encounter (Signed)
Pt is returning Julies call. Advised pt that someone will call her back.

## 2022-06-27 ENCOUNTER — Ambulatory Visit: Payer: Medicare Other | Admitting: Nurse Practitioner

## 2022-06-27 ENCOUNTER — Encounter: Payer: Self-pay | Admitting: Nurse Practitioner

## 2022-06-27 ENCOUNTER — Ambulatory Visit: Payer: Medicare Other | Attending: Nurse Practitioner | Admitting: Nurse Practitioner

## 2022-06-27 VITALS — BP 122/82 | HR 63 | Ht 62.0 in | Wt 137.4 lb

## 2022-06-27 DIAGNOSIS — Z720 Tobacco use: Secondary | ICD-10-CM | POA: Insufficient documentation

## 2022-06-27 DIAGNOSIS — R42 Dizziness and giddiness: Secondary | ICD-10-CM

## 2022-06-27 DIAGNOSIS — R002 Palpitations: Secondary | ICD-10-CM

## 2022-06-27 DIAGNOSIS — I10 Essential (primary) hypertension: Secondary | ICD-10-CM | POA: Diagnosis not present

## 2022-06-27 DIAGNOSIS — E782 Mixed hyperlipidemia: Secondary | ICD-10-CM | POA: Insufficient documentation

## 2022-06-27 MED ORDER — ROSUVASTATIN CALCIUM 10 MG PO TABS: 10.0000 mg | ORAL_TABLET | Freq: Every day | ORAL | 3 refills | Status: DC

## 2022-06-27 NOTE — Patient Instructions (Signed)
Medication Instructions:  Your physician has recommended you make the following change in your medication:  START: Rosuvastatin '10mg'$  daily.  *If you need a refill on your cardiac medications before your next appointment, please call your pharmacy*   Lab Work: Your physician recommends that you return in 6-8 weeks to have the following labs drawn: Lipid panel and Lft's  If you have labs (blood work) drawn today and your tests are completely normal, you will receive your results only by: [place] (if you have MyChart) OR A paper copy in the mail If you have any lab test that is abnormal or we need to change your treatment, we will call you to review the results.   Testing/Procedures: NONE   Follow-Up: At [facility], you and your health needs are our priority.  As part of our continuing mission to provide you with exceptional heart care, we have created designated Provider Care Teams.  These Care Teams include your primary Cardiologist (physician) and Advanced Practice Providers (APPs -  Physician Assistants and Nurse Practitioners) who all work together to provide you with the care you need, when you need it.  We recommend signing up for the patient portal called "MyChart".  Sign up information is provided on this After Visit Summary.  MyChart is used to connect with patients for Virtual Visits (Telemedicine).  Patients are able to view lab/test results, encounter notes, upcoming appointments, etc.  Non-urgent messages can be sent to your provider as well.   To learn more about what you can do with MyChart, go to [email or web address].    Your next appointment:   3-4 month(s)  Provider:   Shelva Majestic, MD

## 2022-06-27 NOTE — Progress Notes (Signed)
Office Visit    Patient Name: Wendy Lopez Date of Encounter: 06/27/2022  Primary Care Provider:  Wenda Low, MD Primary Cardiologist:  Shelva Majestic, MD  Chief Complaint    62 year old female with a history of hypertension, hyperlipidemia, palpitations, epilepsy, breast cancer, anxiety, depression, and tobacco use who presents for follow-up related to hypertension.  Past Medical History    Past Medical History:  Diagnosis Date   Anxiety and depression    Arthritis    Breast cancer ([employer]) 02/13/2009   Cancer of right breast ([place]) 04/16/2012   Depression    Depression 2014   Dysrhythmia    palpitations   Epilepsy ([place])    last seizure 1990's   H/O hiatal hernia    Localization-related (focal) (partial) epilepsy and epileptic syndromes with complex partial seizures, without mention of intractable epilepsy 02/07/2013   Memory deficit 02/07/2013   Memory loss    Mitral valve prolapse 06/01/10   echo- EF>55% mild tricupsid regurgitation. There is mild Pulmonary Hypertension.. Right  ventricular systolic pressure iselevated at 30-40 mmHg. LV systolic function is normal.   Osteopenia    PONV (postoperative nausea and vomiting)    SOB (shortness of breath) 10/18/11   met-test   Spinal fusion failure    3   Past Surgical History:  Procedure Laterality Date   BACK SURGERY     screws- lumbar   BREAST SURGERY  2010   mastectomy - RIGHT   BRONCHIAL WASHINGS  04/01/2021   Procedure: BRONCHIAL WASHINGS;  Surgeon: Garner Nash, DO;  Location: [facility];  Service: Cardiopulmonary;;   COLONOSCOPY WITH PROPOFOL  03/13/2012   Procedure: COLONOSCOPY WITH PROPOFOL;  Surgeon: Garlan Fair, MD;  Location: [facility];  Service: Endoscopy;  Laterality: N/A;   ESOPHAGOGASTRODUODENOSCOPY  03/13/2012   Procedure: ESOPHAGOGASTRODUODENOSCOPY (EGD);  Surgeon: Garlan Fair, MD;  Location: [facility];  Service: Endoscopy;  Laterality: N/A;  Reflux, Hiatal Hernia   HAND  TENDON SURGERY  2012   tendons released in right hand    MASTECTOMY, RADICAL  02/13/2009   SPINE SURGERY  1999, 2003, 2005   TONSILLECTOMY     VIDEO BRONCHOSCOPY Bilateral 04/01/2021   Procedure: VIDEO BRONCHOSCOPY WITHOUT FLUORO;  Surgeon: Garner Nash, DO;  Location: [facility];  Service: Cardiopulmonary;  Laterality: Bilateral;  Ultrathin bronchoscopy    Allergies  Allergies  Allergen Reactions   Carbamazepine Anaphylaxis   Lamictal [Lamotrigine] Anaphylaxis    SOB, Lamotrigine only   Other     STATES HAS SHORTNESS OF BREATH WITH GENERIC DRUGS     ALSO RASH AND ITCHING   Prednisone Shortness Of Breath, Rash and Other (See Comments)   Pseudoephedrine Hcl Er Other (See Comments)    SEIZURES.   Valium Other (See Comments)    Hallucinations, spasticity, hyper-relfexive    Amlodipine Rash    Face swell   Oxycodone Nausea And Vomiting and Rash   Ibuprofen Swelling   Percocet [Oxycodone-Acetaminophen] Nausea And Vomiting   Trokendi Kellogg Er] Other (See Comments)    Made headache worse, dizziness, difficulty breathing, rash   Betadine [Povidone Iodine] Rash   Latex Dermatitis and Rash   Shellfish Allergy Swelling     Labs/Other Studies Reviewed    The following studies were reviewed today: CT cardiac scoring 05/2022: ADDENDUM REPORT: 06/17/2022 20:56   EXAM: OVER-READ INTERPRETATION  CT CHEST   The following report is an over-read performed by radiologist Dr. Carolynn Comment [facility] on 06/17/2022. This over-read does  not include interpretation of cardiac or coronary anatomy or pathology. The coronary calcium score interpretation by the cardiologist is attached.   COMPARISON:  Chest CT 01/28/2022   FINDINGS: No enlarged lymph nodes are seen in the mediastinum. The visualized lungs are clear. Visualized upper abdomen is within normal limits. Right mastectomy changes are present. No significant osseous abnormality.   IMPRESSION: No  significant extracardiac findings.   Cardiac monitor 02/2020: The patient wore Zio patch monitor from January 28, 2020 through February 11, 2020. The predominant rhythm was sinus rhythm with an average rate at 81 bpm. The slowest heart rate was sinus bradycardia at 54 bpm. He fastest heart rate was a 6 beat burst of SVT at 176 bpm. There were total of 21 short-lived episodes of supraventricular tachycardia with the fastest lasting 6 beats at a maximum rate of 176 bpm and the longest lasting 15 beats with an average rate at 103 bpm. A few short episodes may have been atrial tachycardia. There were rare PACs, atrial couplets and triplets all less than 1%. There were rare isolated PVCs at less than 1% without couplets or triplets. There were no episodes of atrial fibrillation. There were no sinus pauses or heart block.   Pharmacological nuclear stress test 02/2018:  Nuclear stress EF: 67%. The left ventricular ejection fraction is hyperdynamic (>65%). There was no ST segment deviation noted during stress. The study is normal. This is a low risk study.   Normal pharmacologic nuclear stress test with no evidence for prior infarct or ischemia.    Recent Labs: 01/28/2022: Hemoglobin 16.3; Platelets 158 06/14/2022: ALT 17; BUN 17; Creatinine, Ser 0.81; Potassium 3.6; Sodium 137; TSH 1.625  Recent Lipid Panel    Component Value Date/Time   CHOL 216 (H) 06/14/2022 0825   CHOL 170 02/17/2017 0820   TRIG 71 06/14/2022 0825   HDL 66 06/14/2022 0825   HDL 70 02/17/2017 0820   CHOLHDL 3.3 06/14/2022 0825   VLDL 14 06/14/2022 0825   LDLCALC 136 (H) 06/14/2022 0825   LDLCALC 87 02/17/2017 0820    History of Present Illness    62 year old female with the above past medical history including hypertension, hyperlipidemia, palpitations, epilepsy, breast cancer, anxiety, depression, and tobacco use.  She has a history of longstanding tobacco use.  She has a history of abnormal EKG.  Cardiac  catheterization in 2009 revealed normal coronary arteries.  Nuclear perfusion study in 2012 showed normal perfusion.  Cardiopulmonary but tested 2013 she reported good chronotropic response without ischemic changes, reduced functional status with peak O2 at 69% of predicted.  She does have a history of mild pulmonary hypertension.  Stress test in 2015 was stable.  Echocardiogram in 2016 was unchanged from prior, EF 60 to 65%, trivial TR and MR, pulmonary pressures were normal. Lexiscan Myoview in 2019 in the setting of intermittent chest tightness continue to show normal perfusion, low risk.  She also has a history of palpitations. Event monitor in 2020 showed predominantly sinus rhythm, sinus bradycardia, sinus tachycardia, no significant arrhythmia.  Repeat cardiac monitor in 05/2019 showed predominantly sinus rhythm, brief episodes of SVT longest lasting 15 beats.  She has been managed on metoprolol.  Coronary calcium score in 05/2022 was 1 (59 percentile).  She contacted our office on 06/16/2022 and reported intermittent dizziness, elevated BP.  She was started on losartan 25 mg daily.  She presents today for follow-up.  Since her last visit has been stable from a cardiac standpoint.  She notes that when  she was having dizziness and elevated BP she also noticed tightness in her neck and some discomfort in her right arm.  BP has been well controlled with addition of losartan.  She denies any recurrent dizziness, denies symptoms concerning for angina.  She continues to smoke.   Home Medications    Current Outpatient Medications  Medication Sig Dispense Refill   aspirin-acetaminophen-caffeine (EXCEDRIN MIGRAINE) 250-250-65 MG tablet Take 1 tablet by mouth every 8 (eight) hours as needed for headache.     Cyanocobalamin (VITAMIN B12) 1000 MCG TBCR Take 1,000 mcg by mouth in the morning.     diltiazem (CARDIZEM) 30 MG tablet Take 30 mg by mouth 3 (three) times daily as needed (heart palpitations/increased  heart rates).     EPINEPHrine 0.3 mg/0.3 mL IJ SOAJ injection Inject 0.3 mg into the muscle as needed for anaphylaxis.     levETIRAcetam (KEPPRA) 750 MG tablet Take 1 tablet (750 mg total) by mouth 2 (two) times daily. 60 tablet 11   losartan (COZAAR) 25 MG tablet Take 1 tablet (25 mg total) by mouth daily. 90 tablet 3   meclizine (ANTIVERT) 25 MG tablet Take 25 mg by mouth 3 (three) times daily as needed for dizziness.     metoprolol succinate (TOPROL-XL) 100 MG 24 hr tablet TAKE 1 TABLET(100 MG) BY MOUTH DAILY WITH OR IMMEDIATELY FOLLOWING A MEAL 90 tablet 3   Misc. Devices MISC Please provide patient with right breast prosthesis and 6 mastectomy bras sue to a history of right breast cancer and mastectomy. (ICD10- C50.111) 6 each 0   rosuvastatin (CRESTOR) 10 MG tablet Take 1 tablet (10 mg total) by mouth daily. 90 tablet 3   sodium chloride (OCEAN) 0.65 % nasal spray Place 1-2 sprays into the nose as needed for congestion.     Tiotropium Bromide-Olodaterol (STIOLTO RESPIMAT) 2.5-2.5 MCG/ACT AERS Inhale 2 puffs into the lungs daily. 4 g 5   Vitamin D, Ergocalciferol, (DRISDOL) 1.25 MG (50000 UNIT) CAPS capsule Take 1 capsule (50,000 Units total) by mouth 2 (two) times a week. 10 capsule 11   ZYRTEC ALLERGY 10 MG tablet Take 10 mg by mouth at bedtime.     No current facility-administered medications for this visit.     Review of Systems    She denies chest pain, palpitations, dyspnea, pnd, orthopnea, n, v, dizziness, syncope, edema, weight gain, or early satiety. All other systems reviewed and are otherwise negative except as noted above.   Physical Exam    VS:  BP 122/82   Pulse 63   Ht '5\' 2"'$  (1.575 m)   Wt 137 lb 6.4 oz (62.3 kg)   LMP 11/03/1993   SpO2 98%   BMI 25.13 kg/m  GEN: Well nourished, well developed, in no acute distress. HEENT: normal. Neck: Supple, no JVD, carotid bruits, or masses. Cardiac: RRR, no murmurs, rubs, or gallops. No clubbing, cyanosis, edema.   Radials/DP/PT 2+ and equal bilaterally.  Respiratory:  Respirations regular and unlabored, clear to auscultation bilaterally. GI: Soft, nontender, nondistended, BS + x 4. MS: no deformity or atrophy. Skin: warm and dry, no rash. Neuro:  Strength and sensation are intact. Psych: Normal affect.  Accessory Clinical Findings    ECG personally reviewed by me today -NSR, 63 bpm nonspecific ST/T wave abnormality- no acute changes.   Lab Results  Component Value Date   WBC 7.3 01/28/2022   HGB 16.3 (H) 01/28/2022   HCT 46.9 (H) 01/28/2022   MCV 89.5 01/28/2022   PLT 158  01/28/2022   Lab Results  Component Value Date   CREATININE 0.81 06/14/2022   BUN 17 06/14/2022   NA 137 06/14/2022   K 3.6 06/14/2022   CL 103 06/14/2022   CO2 25 06/14/2022   Lab Results  Component Value Date   ALT 17 06/14/2022   AST 18 06/14/2022   ALKPHOS 78 06/14/2022   BILITOT 0.9 06/14/2022   Lab Results  Component Value Date   CHOL 216 (H) 06/14/2022   HDL 66 06/14/2022   LDLCALC 136 (H) 06/14/2022   TRIG 71 06/14/2022   CHOLHDL 3.3 06/14/2022    No results found for: "HGBA1C"  Assessment & Plan    1. Hypertension: BP well controlled. Continue current antihypertensive regimen.   2. Dizziness: Occurred in the setting of elevated BP.  Resolved with better BP control.  No indication for further testing at this time.  3. R sided neck/arm pain: Occurred in the setting of elevated BP.  Recent coronary calcium score in 05/2022 was 1 (59 percentile).  Prior [employer] in 2019 showed normal perfusion, low risk.  Should she continue to have symptoms, could consider coronary CT angiogram.  4. Palpitations: She notes intermittent fleeting palpitations, stable overall.  Continue Toprol, as needed diltiazem.  5. Hyperlipidemia: LDL was 136 in 05/2022. LP(a) was elevated at 121.4.  Dr. Claiborne Billings recommended initiation of Crestor 40 mg daily.  Patient is hesitant to start at such a high dose.  Through shared  decision making, will start Crestor 10 mg daily.  Will check fasting lipid panel, LFTs in 6 to 8 weeks.  If she tolerates this, we will plan to increase to 20 mg daily.   6. Tobacco use: She continues to smoke.  Full cessation advised.  7. Disposition: Follow-up in 3-4 months with Dr. Claiborne Billings.      Lenna Sciara, NP 06/27/2022, 9:32 PM

## 2022-07-11 ENCOUNTER — Encounter: Payer: Self-pay | Admitting: Neurology

## 2022-07-11 ENCOUNTER — Ambulatory Visit (INDEPENDENT_AMBULATORY_CARE_PROVIDER_SITE_OTHER): Payer: Medicare Other | Admitting: Neurology

## 2022-07-11 VITALS — BP 127/78 | HR 69 | Ht 62.5 in | Wt 139.2 lb

## 2022-07-11 DIAGNOSIS — M542 Cervicalgia: Secondary | ICD-10-CM | POA: Diagnosis not present

## 2022-07-11 DIAGNOSIS — G8929 Other chronic pain: Secondary | ICD-10-CM | POA: Diagnosis not present

## 2022-07-11 DIAGNOSIS — G40109 Localization-related (focal) (partial) symptomatic epilepsy and epileptic syndromes with simple partial seizures, not intractable, without status epilepticus: Secondary | ICD-10-CM

## 2022-07-11 DIAGNOSIS — Z5181 Encounter for therapeutic drug level monitoring: Secondary | ICD-10-CM

## 2022-07-11 DIAGNOSIS — E538 Deficiency of other specified B group vitamins: Secondary | ICD-10-CM

## 2022-07-11 DIAGNOSIS — R519 Headache, unspecified: Secondary | ICD-10-CM

## 2022-07-11 MED ORDER — GABAPENTIN 100 MG PO CAPS: 100.0000 mg | ORAL_CAPSULE | Freq: Every day | ORAL | 0 refills | Status: DC

## 2022-07-11 NOTE — Progress Notes (Signed)
PATIENT: Wendy Lopez DOB: 1961/03/01  REASON FOR VISIT: follow up HISTORY FROM: patient Primary Neurologist: Dr. Jannifer Franklin  Today 07/11/22 Patient presents today for follow-up, last visit was in November, at that time we increased the Keppra 750 mg twice daily.  Since then she denies any seizure or seizure-like activity.  She still complaining of headaches, and some neck tightness.  She reports a long history of neck pain, at some point she even did receive steroid injection.  Stated that some of her headaches are located on the back and of the head.  Again no seizures. Her PCP has recently recheck her vitamin B12 and D level and they were still low.  She was taking vitamin B-12 supplement but her levels are still low.  She will discuss with her PCP regarding B12 injection   INTERVAL HISTORY 02/25/2022:  Patient was called for follow up. At last visit, plan was to switch Dilantin to Topiramate. She reports doing the switch but she cannot tolerate the Topiramate. Topamax gave her side effects including: Dizziness, tingling and hands, bitter taste, cannot eat anything, soda tastes really bad. She is still on Keppra 500 mg BID.  In the past she had try Depakote but had a really severe allergic reaction. She also had an allergy to Carbamazepine.  She denies any seizure or seizure like activities and reports that her headaches have gone away since starting the Topiramate. She is worried headaches will come back if she discontinued the Topiramate.   INTERVAL HISTORY 12/08/21 [name] presents today for follow-up, last visit was in February.  At that time, due to side effect of Keppra we decided to switch over to Briviact.  The medication was approved by her insurance but her co-pay was almost $500 a month which she could not afford.  She was put back on the Keppra and Dilantin.  Denies any additional seizures but still experience the side effect.  She would like one day to come off Keppra and  Dilantin if possible.  Her current complaint at the moment is worsening migraines.  In the past she was put on Trokendi but reported have side effect, Trokendi made her migraine worse. She is willing to try it again (Topamax)   INTERVAL HISTORY 06/09/2021: Patient present today for follow-up, last visit was in July.  At that time she reported doing well on Keppra and Dilantin, no seizures.  She reported in January she was diagnosed with infection and was put on Levaquin, while on Levaquin she had 2 of her typical seizure with the aura, no convulsions.  Patient report with the [place] she had personality changes, she cannot focus, cannot concentrate she is irritable and would like to switch medication.  Discussed about other type of medication but she has allergy to the failure on general medication.  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   INTERVAL HISTORY 10/26/2020 Wendy Lopez is a 62 year old female with history of seizures and migraine headaches. Had dizziness last year, MRI of the brain showed few white matter lesions, nonspecific, could be related to migraine history. Requires brand name medications, highly allergic to filler in generic medications, on Dilantin, but generic Keppra. Took brand name Trokendi for headaches, reports facial swelling, breaking out, tremors, stopped it. Had headaches up until March, gradually continues to improve. Last week had migraine, has vomiting with migraines, doesn't want more medications due to high sensitivity. Had Dilantin level checked 10/20/20, Dilantin level  8.4. Claims pre-seizure feeling last week, foggy, sensation/squeezing in neck and chest. 4 grand mal seizures in her life, most have been partial. Wonders if brand name medication Dilantin has been changed to account for recent feeling. Here today alone.   Reviewed blood work from PCP collected October 20, 2020: Dilantin level was 8.4, Keppra level was 23.7,  CMP was normal, CBC showed slightly elevated HCT 47.2, platelet level mildly low 136, TSH 1.08.  Update 03/02/2020 SS: Wendy Lopez is a 62 year old female with history of seizures, last seizure was in August 2020, she had awakened and had bitten her tongue, her Dilantin was increased.  She is on Dilantin and Keppra.  She is highly allergic to, filler in generic medications, but is tolerating generic Keppra, has found a specific manufacturer (Lupen) works well for her.  Today, reports return of her migraines, that she was experiencing her 30s, also dizziness (floaty sensation), hard to say exactly when it started, but probably around May.  Headache is bilateral, pounding, photophobia, phonophobia, nausea, occasional vomiting.  Reports daily headache, usually not in the morning, could be anytime throughout the day.  Unable to identify triggers.  Headache and dizziness are related.  Taking daily Excedrin Migraine.  Usually dizziness is brought on by movement on the screen (TV/phone), pending appointment with ophthalmology.  Has seen PCP, CT head in October was unremarkable.  Since June, noted decreased sensation/numbness to her right lower face, no facial droop or slurred speech.  Has had several falls related to dizziness.  No reported changes in medications.  Has seen cardiology, heart monitor is pending.  Had Covid vaccine 2nd dose May 10th, that night, had fever 103, shaking, had dj vu spell, which can be associated with seizure.  Had a tremor in both hands for 8 weeks, that went away.  Presents today for evaluation unaccompanied.  HISTORY 06/05/2019 SS: Wendy Lopez is a 62 year old female with history of seizures.  When last seen, she had likely seizure episode, when she had awakened and had bitten her tongue.  Her Dilantin level was increased with a 30 mg capsule, she remains on Keppra.  She indicates since increasing her dose of Dilantin, she is actually feeling a lot better, has been having more energy,  she is very pleased.  She is taking generic Keppra, has a Licensed conveyancer (Lupen) that works for her.  She says she is highly allergic to a common filler in generic medications.  Her allergy has been so significant, at times has had to use EpiPen.  She has not had recurrent seizure.  She is working with pharmacy, unsure, how much more medication she will be able to receive from this manufacturer.  In the past, she has been on brand-name Keppra, but insurance did not pay.  She presents today for follow-up unaccompanied.   REVIEW OF SYSTEMS: Out of a complete 14 system review of symptoms, the patient complains only of the following symptoms, and all other reviewed systems are negative.  See HPI  ALLERGIES: Allergies  Allergen Reactions   Carbamazepine Anaphylaxis   Lamictal [Lamotrigine] Anaphylaxis    SOB, Lamotrigine only   Other     STATES HAS SHORTNESS OF BREATH WITH GENERIC DRUGS     ALSO RASH AND ITCHING   Prednisone Other (See Comments), Rash and Shortness Of Breath    Prescribing MD told patient he thinks dose (dose pack) was "just too high.", 03/09/11   STATES HAS TAKEN SMALLER DOSE AND STILL HAD RASH AND JITTERINESS, Prescribing  MD told patient he thinks dose (dose pack) was "just too high.", 03/09/11   STATES HAS TAKEN SMALLER DOSE AND STILL HAD RASH AND JITTERINESS   Pseudoephedrine Hcl Er Other (See Comments)    SEIZURES.   Valium Other (See Comments)    Hallucinations, spasticity, hyper-relfexive    Amlodipine Rash    Face swell   Oxycodone Nausea And Vomiting, Rash and Other (See Comments)   Ibuprofen Swelling   Percocet [Oxycodone-Acetaminophen] Nausea And Vomiting   Trokendi Kell[employer] Er] Other (See Comments)    Made headache worse, dizziness, difficulty breathing, rash   Betadine [Povidone Iodine] Rash   Latex Dermatitis and Rash   Shellfish Allergy Swelling    HOME MEDICATIONS: Outpatient Medications Prior to Visit  Medication Sig Dispense Refill    aspirin-acetaminophen-caffeine (EXCEDRIN MIGRAINE) 250-250-65 MG tablet Take 1 tablet by mouth every 8 (eight) hours as needed for headache.     Cyanocobalamin (VITAMIN B12) 1000 MCG TBCR Take 1,000 mcg by mouth in the morning.     diltiazem (CARDIZEM) 30 MG tablet Take 30 mg by mouth 3 (three) times daily as needed (heart palpitations/increased heart rates).     EPINEPHrine 0.3 mg/0.3 mL IJ SOAJ injection Inject 0.3 mg into the muscle as needed for anaphylaxis.     levETIRAcetam (KEPPRA) 750 MG tablet Take 1 tablet (750 mg total) by mouth 2 (two) times daily. 60 tablet 11   losartan (COZAAR) 25 MG tablet Take 1 tablet (25 mg total) by mouth daily. 90 tablet 3   meclizine (ANTIVERT) 25 MG tablet Take 25 mg by mouth 3 (three) times daily as needed for dizziness.     metoprolol succinate (TOPROL-XL) 100 MG 24 hr tablet TAKE 1 TABLET(100 MG) BY MOUTH DAILY WITH OR IMMEDIATELY FOLLOWING A MEAL 90 tablet 3   Misc. Devices MISC Please provide patient with right breast prosthesis and 6 mastectomy bras sue to a history of right breast cancer and mastectomy. (ICD10- C50.111) 6 each 0   rosuvastatin (CRESTOR) 10 MG tablet Take 1 tablet (10 mg total) by mouth daily. 90 tablet 3   sodium chloride (OCEAN) 0.65 % nasal spray Place 1-2 sprays into the nose as needed for congestion.     Tiotropium Bromide-Olodaterol (STIOLTO RESPIMAT) 2.5-2.5 MCG/ACT AERS Inhale 2 puffs into the lungs daily. 4 g 5   Vitamin D, Ergocalciferol, (DRISDOL) 1.25 MG (50000 UNIT) CAPS capsule Take 1 capsule (50,000 Units total) by mouth 2 (two) times a week. 10 capsule 11   ZYRTEC ALLERGY 10 MG tablet Take 10 mg by mouth at bedtime.     No facility-administered medications prior to visit.    PAST MEDICAL HISTORY: Past Medical History:  Diagnosis Date   Anxiety and depression    Arthritis    Breast cancer ([place]) 02/13/2009   Cancer of right breast ([place]) 04/16/2012   Depression    Depression 2014   Dysrhythmia    palpitations    Epilepsy ([place])    last seizure 1990's   H/O hiatal hernia    Localization-related (focal) (partial) epilepsy and epileptic syndromes with complex partial seizures, without mention of intractable epilepsy 02/07/2013   Memory deficit 02/07/2013   Memory loss    Mitral valve prolapse 06/01/10   echo- EF>55% mild tricupsid regurgitation. There is mild Pulmonary Hypertension.. Right  ventricular systolic pressure iselevated at 30-40 mmHg. LV systolic function is normal.   Osteopenia    PONV (postoperative nausea and vomiting)    SOB (shortness of breath) 10/18/11  met-test   Spinal fusion failure    3    PAST SURGICAL HISTORY: Past Surgical History:  Procedure Laterality Date   BACK SURGERY     screws- lumbar   BREAST SURGERY  2010   mastectomy - RIGHT   BRONCHIAL WASHINGS  04/01/2021   Procedure: BRONCHIAL WASHINGS;  Surgeon: Garner Nash, DO;  Location: [facility];  Service: Cardiopulmonary;;   COLONOSCOPY WITH PROPOFOL  03/13/2012   Procedure: COLONOSCOPY WITH PROPOFOL;  Surgeon: Garlan Fair, MD;  Location: [facility];  Service: Endoscopy;  Laterality: N/A;   ESOPHAGOGASTRODUODENOSCOPY  03/13/2012   Procedure: ESOPHAGOGASTRODUODENOSCOPY (EGD);  Surgeon: Garlan Fair, MD;  Location: [facility];  Service: Endoscopy;  Laterality: N/A;  Reflux, Hiatal Hernia   HAND TENDON SURGERY  2012   tendons released in right hand    MASTECTOMY, RADICAL  02/13/2009   SPINE SURGERY  1999, 2003, 2005   TONSILLECTOMY     VIDEO BRONCHOSCOPY Bilateral 04/01/2021   Procedure: VIDEO BRONCHOSCOPY WITHOUT FLUORO;  Surgeon: Garner Nash, DO;  Location: [facility];  Service: Cardiopulmonary;  Laterality: Bilateral;  Ultrathin bronchoscopy    FAMILY HISTORY: Family History  Problem Relation Age of Onset   Arthritis Mother        rhuematoid   Heart disease Mother        Atrial fib   Heart disease Father    Heart disease Sister        Atrial fib    SOCIAL HISTORY: Social  History   Socioeconomic History   Marital status: Married    Spouse name: Not on file   Number of children: 1   Years of education: college 2   Highest education level: Not on file  Occupational History   Occupation: unemployed    Employer: UNEMPLOYED    Comment: 2003  Tobacco Use   Smoking status: Every Day    Packs/day: 1.00    Years: 43.00    Additional pack years: 0.00    Total pack years: 43.00    Types: Cigarettes   Smokeless tobacco: Never   Tobacco comments:    1/2 pk per day  Substance and Sexual Activity   Alcohol use: No    Alcohol/week: 0.0 standard drinks of alcohol   Drug use: No   Sexual activity: Not on file  Other Topics Concern   Not on file  Social History Narrative   Patient lives at home with husband   Patient is right handed.   Patient does not drink caffeine.   Social Determinants of Health   Financial Resource Strain: Not on file  Food Insecurity: Not on file  Transportation Needs: Not on file  Physical Activity: Not on file  Stress: Not on file  Social Connections: Not on file  Intimate Partner Violence: Not on file   PHYSICAL EXAM  Vitals:   07/11/22 1530  BP: 127/78  Pulse: 69  Weight: 139 lb 3.2 oz (63.1 kg)  Height: 5' 2.5" (1.588 m)     Body mass index is 25.05 kg/m.  Generalized: Well developed, in no acute distress   Neurological examination  Mentation: Alert oriented to time, place, history taking. Follows all commands speech and language fluent  DIAGNOSTIC DATA (LABS, IMAGING, TESTING) - I reviewed patient records, labs, notes, testing and imaging myself where available.  Lab Results  Component Value Date   WBC 7.3 01/28/2022   HGB 16.3 (H) 01/28/2022   HCT 46.9 (H) 01/28/2022   MCV 89.5 01/28/2022  PLT 158 01/28/2022      Component Value Date/Time   NA 137 06/14/2022 0824   NA 143 03/02/2020 0917   K 3.6 06/14/2022 0824   CL 103 06/14/2022 0824   CO2 25 06/14/2022 0824   GLUCOSE 100 (H) 06/14/2022  0824   BUN 17 06/14/2022 0824   BUN 11 03/02/2020 0917   CREATININE 0.81 06/14/2022 0824   CALCIUM 8.9 06/14/2022 0824   PROT 6.9 06/14/2022 0824   PROT 6.3 03/02/2020 0917   ALBUMIN 3.9 06/14/2022 0824   ALBUMIN 4.1 03/02/2020 0917   AST 18 06/14/2022 0824   ALT 17 06/14/2022 0824   ALKPHOS 78 06/14/2022 0824   BILITOT 0.9 06/14/2022 0824   BILITOT <0.2 03/02/2020 0917   GFRNONAA >60 06/14/2022 0824   GFRAA 110 03/02/2020 0917   Lab Results  Component Value Date   CHOL 216 (H) 06/14/2022   HDL 66 06/14/2022   LDLCALC 136 (H) 06/14/2022   TRIG 71 06/14/2022   CHOLHDL 3.3 06/14/2022   No results found for: "HGBA1C" Lab Results  Component Value Date   VITAMINB12 246 06/14/2022   Lab Results  Component Value Date   TSH 1.625 06/14/2022    ASSESSMENT AND PLAN 62 y.o. year old female  has a past medical history of Anxiety and depression, Arthritis, Breast cancer ([place]) (02/13/2009), Cancer of right breast ([place]) (04/16/2012), Depression, Depression (2014), Dysrhythmia, Epilepsy ([place]), H/O hiatal hernia, Localization-related (focal) (partial) epilepsy and epileptic syndromes with complex partial seizures, without mention of intractable epilepsy (02/07/2013), Memory deficit (02/07/2013), Memory loss, Mitral valve prolapse (06/01/10), Osteopenia, PONV (postoperative nausea and vomiting), SOB (shortness of breath) (10/18/11), and Spinal fusion failure. here with:  1.  History of seizures 2.  Headache 3. Cervicalgia    Patient Instructions  Continue with Keppra 750 mg twice daily Will check a Keppra level today Trial of gabapentin 100 mg nightly, can increase to 100 mg twice daily Consider massage therapy and acupuncture for the cervicalgia Follow-up in 6 months or sooner if worse     Alric Ran, MD 07/11/2022, 4:36 PM [facility] [address] [place], [place] [address] [phone]

## 2022-07-11 NOTE — Patient Instructions (Signed)
Continue with Keppra 750 mg twice daily Will check a Keppra level today Trial of gabapentin 100 mg nightly, can increase to 100 mg twice daily Consider massage therapy and acupuncture for the cervicalgia Follow-up in 6 months or sooner if worse

## 2022-07-13 LAB — LEVETIRACETAM LEVEL: Levetiracetam Lvl: 32.8 ug/mL (ref 10.0–40.0)

## 2022-08-01 DIAGNOSIS — G40909 Epilepsy, unspecified, not intractable, without status epilepticus: Secondary | ICD-10-CM | POA: Diagnosis not present

## 2022-08-01 DIAGNOSIS — M519 Unspecified thoracic, thoracolumbar and lumbosacral intervertebral disc disorder: Secondary | ICD-10-CM | POA: Diagnosis not present

## 2022-08-01 DIAGNOSIS — I700 Atherosclerosis of aorta: Secondary | ICD-10-CM | POA: Diagnosis not present

## 2022-08-01 DIAGNOSIS — E559 Vitamin D deficiency, unspecified: Secondary | ICD-10-CM | POA: Diagnosis not present

## 2022-08-01 DIAGNOSIS — Z853 Personal history of malignant neoplasm of breast: Secondary | ICD-10-CM | POA: Diagnosis not present

## 2022-08-01 DIAGNOSIS — I10 Essential (primary) hypertension: Secondary | ICD-10-CM | POA: Diagnosis not present

## 2022-08-01 DIAGNOSIS — M8588 Other specified disorders of bone density and structure, other site: Secondary | ICD-10-CM | POA: Diagnosis not present

## 2022-08-01 DIAGNOSIS — E782 Mixed hyperlipidemia: Secondary | ICD-10-CM | POA: Diagnosis not present

## 2022-08-01 DIAGNOSIS — K8020 Calculus of gallbladder without cholecystitis without obstruction: Secondary | ICD-10-CM | POA: Diagnosis not present

## 2022-08-01 DIAGNOSIS — Z0000 Encounter for general adult medical examination without abnormal findings: Secondary | ICD-10-CM | POA: Diagnosis not present

## 2022-08-01 DIAGNOSIS — J439 Emphysema, unspecified: Secondary | ICD-10-CM | POA: Diagnosis not present

## 2022-08-01 DIAGNOSIS — E538 Deficiency of other specified B group vitamins: Secondary | ICD-10-CM | POA: Diagnosis not present

## 2022-08-10 DIAGNOSIS — K8020 Calculus of gallbladder without cholecystitis without obstruction: Secondary | ICD-10-CM | POA: Diagnosis not present

## 2022-08-10 DIAGNOSIS — K7689 Other specified diseases of liver: Secondary | ICD-10-CM | POA: Diagnosis not present

## 2022-09-07 DIAGNOSIS — K219 Gastro-esophageal reflux disease without esophagitis: Secondary | ICD-10-CM | POA: Diagnosis not present

## 2022-09-07 DIAGNOSIS — R109 Unspecified abdominal pain: Secondary | ICD-10-CM | POA: Diagnosis not present

## 2022-09-07 DIAGNOSIS — E785 Hyperlipidemia, unspecified: Secondary | ICD-10-CM | POA: Diagnosis not present

## 2022-09-19 DIAGNOSIS — R197 Diarrhea, unspecified: Secondary | ICD-10-CM | POA: Diagnosis not present

## 2022-09-19 DIAGNOSIS — R1013 Epigastric pain: Secondary | ICD-10-CM | POA: Diagnosis not present

## 2022-09-19 DIAGNOSIS — Z1211 Encounter for screening for malignant neoplasm of colon: Secondary | ICD-10-CM | POA: Diagnosis not present

## 2022-09-21 DIAGNOSIS — R197 Diarrhea, unspecified: Secondary | ICD-10-CM | POA: Diagnosis not present

## 2022-10-07 ENCOUNTER — Ambulatory Visit: Payer: Medicare Other | Attending: Cardiovascular Disease | Admitting: Cardiovascular Disease

## 2022-10-07 VITALS — BP 134/82 | HR 69 | Ht 62.0 in | Wt 141.0 lb

## 2022-10-07 DIAGNOSIS — I10 Essential (primary) hypertension: Secondary | ICD-10-CM

## 2022-10-07 DIAGNOSIS — E7800 Pure hypercholesterolemia, unspecified: Secondary | ICD-10-CM | POA: Diagnosis not present

## 2022-10-07 DIAGNOSIS — C50111 Malignant neoplasm of central portion of right female breast: Secondary | ICD-10-CM | POA: Diagnosis not present

## 2022-10-07 DIAGNOSIS — E782 Mixed hyperlipidemia: Secondary | ICD-10-CM

## 2022-10-07 DIAGNOSIS — Z170 Estrogen receptor positive status [ER+]: Secondary | ICD-10-CM | POA: Diagnosis not present

## 2022-10-07 DIAGNOSIS — Z720 Tobacco use: Secondary | ICD-10-CM

## 2022-10-07 DIAGNOSIS — G40909 Epilepsy, unspecified, not intractable, without status epilepticus: Secondary | ICD-10-CM | POA: Diagnosis not present

## 2022-10-07 DIAGNOSIS — R002 Palpitations: Secondary | ICD-10-CM

## 2022-10-07 MED ORDER — ATORVASTATIN CALCIUM 20 MG PO TABS: 20.0000 mg | ORAL_TABLET | Freq: Every day | ORAL | 3 refills | Status: DC

## 2022-10-07 NOTE — Patient Instructions (Signed)
Medication Instructions:  Take Atorvastatin 10 mg daily for 2 weeks then increase to 20 mg daily Continue all other medications *If you need a refill on your cardiac medications before your next appointment, please call your pharmacy*   Lab Work: Cmet,lipid panel in October Lab order enclosed   Testing/Procedures: None ordered   Follow-Up: At [facility], you and your health needs are our priority.  As part of our continuing mission to provide you with exceptional heart care, we have created designated Provider Care Teams.  These Care Teams include your primary Cardiologist (physician) and Advanced Practice Providers (APPs -  Physician Assistants and Nurse Practitioners) who all work together to provide you with the care you need, when you need it.  We recommend signing up for the patient portal called "MyChart".  Sign up information is provided on this After Visit Summary.  MyChart is used to connect with patients for Virtual Visits (Telemedicine).  Patients are able to view lab/test results, encounter notes, upcoming appointments, etc.  Non-urgent messages can be sent to your provider as well.   To learn more about what you can do with MyChart, go to [email or web address].    Your next appointment:  11/24    Provider:  Kandice Robinsons

## 2022-10-07 NOTE — Progress Notes (Signed)
Patient ID: Wendy Lopez, female   DOB: 30-Aug-1960, 62 y.o.   MRN: [id number]       HPI: Wendy Lopez is a 62 y.o. female who presents for a 5 month cardiology follow-up evaluation.   Wendy Lopez has a long-standing history of tobacco abuse and started smoking at age 68.  In 2009 cardiac catheterization revealed normal coronary arteries. In February 2012 a nuclear perfusion study was done after she experienced episodes of chest pain and shortness of breath and this revealed normal perfusion. An echo Doppler study demonstrated mild pulmonary hypertension with estimated RV systolic pressure of 36 mm. Because of issues of recurrent shortness of breath she underwent a cardiopulmonary met test and was found to have a blunted chronotropic response to exercise which is making it difficult for her to improve her endurance and exercise capacity. She has a history of tachypalpitations. She has a history of invasive ductal carcinoma and is status post right mastectomy with sentinel node biopsy and is status post chemotherapy. She has a history of vitamin D insufficiency mild blood pressure elevation. There is also a history of depression.  On 10/16/2012 , a 2-D echo Doppler study  revealed an ejection fraction in the range of 60-65%. She did not have wall motion abnormalities and had normal diastolic function. There was evidence for right ventricular hypertrophy with normal RV function. She again had mild pulmonary hypertension with an estimated pressure 39 mm. There is moderate tricuspid regurgitation. Wendy Lopez continues to smoke cigarettes. She does note some indigestion. He also has noticed some left leg weakness and has issues with L4-L5 disc disease. She previously was on a higher dose of present Brintellex but due to tremors she has reduced his dose to 5 mg daily.  A subsequent nuclear perfusion study was done on 11/20/2012 after her ECG revealed slight additional T-wave abnormalities and was  essentially normal without wall motion abnormality, scar or ischemia.  She has a history of cervical disc disease and had noted some upper back discomfort in the past.  In December 2015, her EKG had shown  more pronounced downsloping ST segment depression in the inferior to inferolateral leads.  I recommended a subsequent nuclear perfusion study.  This was done on 04/17/2014 , which was not significant change from her previous study.  She again had basal inferolateral ST-T changes with T-wave inversion in V3 through V4 which did not significant change with stress.  She had normal perfusion with post-rest ejection fraction at 65%.  When seen in 2016 she was smoking one half pack of cigarettes per day.  She had had medication issues and was on Keppra in place of Lamictal for seizures due to cost. Recently, she  had some issue with metoprolol succinate.  She had self reduced this to 100 mg per day.  When I saw her several months ago.  She was wanting to switch to a shorter acting version, however, noted more palpitations on the tartrate preparation. As result, she put herself back on long-acting Toprol.  I saw her in October 2018 and over the 2 years previously she was  without anginal symptoms.  She quit smoking in December 2017, but unfortunately resumed during the period of increased stress 4 months later.  Unfortunately she was still smoking cigarettes.  She was no longer having caffeine or chocolate.  She denies any exertional chest pain.  Her palpitations have been fairly well controlled on Toprol 200 mg daily.  She denied seizure activity and is  on Keppra and Dilantin.    She has undergone several additional evaluations with Georgie Chard, NP and Azalee Course, PA in November and December 2019.  She had experienced intermittent midsternal chest pain/tightness without radiation which she described to Georgie Chard, NP on 02/16/2018.  She subsequently underwent a Lexiscan Myoview which was normal.  EF was  greater than 65%.  Because of potential lower extremity claudication issues on March 06, 2018 lower semi-Doppler imaging revealed normal ABIs bilaterally.  An echo Doppler study November 2019 showed an EF of 55 to 60% without wall motion abnormalities.  She had a mildly thickened mitral valve without frank prolapse.  She was seen by Azalee Course, PA in follow-up.  She also underwent nerve conduction studies by Dr. Lesia Sago and she does not have peripheral neuropathy.  She continues to have issues with her neck and since Dr. Channing Mutters has retired she is now seeing Dr. Bernardo Heater.  Unfortunately she still smoking 1/2 to 1 pack/day.  She admits to occasional palpitations at night with heart rates going up into the 110 range.  Apparently she had had a home study for evaluation of potential sleep apnea by Dr. Eula Listen.  I saw her in February 08, 2019.  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  She wore an event monitor from July 30 through November 28, 2018.  She was in sinus rhythm with an average rate at 70 bpm.  The fastest heart rate was sinus tachycardia at 112 bpm.  She did not have any episodes of atrial fibrillation, SVT, or prolonged bradycardia or ectopy.  She continues to smoke cigarettes.  She does undergo yearly low-dose CT imaging for lung cancer screening.   She has been evaluated by Corine Shelter on several occasions, most recently in November 2021.  She had worn a cardiac monitor from October 12 through February 11, 2020.  The predominant rhythm was sinus with an average rate of 81 bpm.  The slowest heart rate was sinus bradycardia 54 bpm.  There was a 6 beat burst of SVT at 176 bpm and there were a total of 21 short-lived episodes of SVT with the longest lasting 15 beats at an average rate at 103 bpm.  There were rare PACs, atrial couplets and triplets less than 1% and there were rare isolated PVCs less than  1%.  She was evaluated by [facility] neurology, Margie Ege, NP in July 2022 for her seizures.  She had Dr. Anne Hahn for her neurologist who is recently retired.  I last saw her on January 27, 2021.  She apparently had a short-lived recent seizure 1 week ago and has required brand-name medications and is on Dilantin extended release 100 mg 3 times a day, Keppra 500 mg in the morning and 1000 mg at night and has been on metoprolol succinate 100 mg daily.  She is on sertraline 10 mg.  She denied chest pain orrecent palpitations.  Unfortunately she still smokes.  At that time, her resting pulse was well-controlled on metoprolol 100 mg daily.  I again discussed the importance of complete discontinuance of tobacco.  She undergoes yearly low-dose chest CTA's in light of her longstanding tobacco history and she continues to see Dr. Ellin Saba for oncology follow-up.  I last saw her on April 28, 2022. She continued to smoke , now reduced at approximately 1 pack/week.  She has not had recent seizures.  She  continues to be on Keppra.  She is taking metoprolol succinate 100 mg daily.  She is followed by Dr. Tonia Brooms of pulmonary and is on Stiolto Respimat.  She denies chest tightness.  She sees Dr. Eula Listen for primary care at [facility].  She has documented aortic atherosclerosis.  She has not had recent laboratory.  During that evaluation, I recommended follow-up laboratory with comprehensive metabolic panel, lipid studies, TSH, LP(a), as well as vitamin D and B12 levels since these had been abnormal previously.  With her continued tobacco history as well as aortic atherosclerosis I recommended a baseline calcium score.  I again stressed the importance of complete smoking cessation.  She underwent a calcium score on June 14, 2022 which was excellent at 1, representing 59th percentile.  Chest CT over read did not show any extracardiac findings.  She continues to be evaluated at [facility] neurology with Dr. Teresa Coombs and sees Dr.  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  Recently, she has had issues with right-sided abdominal pain and was felt possibly to have gallbladder disease.  She has been evaluated by GI and is scheduled to undergo EGD and colonoscopy next week.  She apparently developed myalgias on rosuvastatin.  When she saw her primary physician she was given a prescription for atorvastatin to try instead but she did not institute this.  She has been on losartan at just 25 mg and metoprolol succinate 100 mg daily for blood pressure control.  She is unaware of recurrent seizures on Keppra.  She continues to be on Stiolto Respimat with her lung disease followed by Dr. Tonia Brooms.  She presents for evaluation.   Past Medical History:  Diagnosis Date   Anxiety and depression    Arthritis    Breast cancer (HCC) 02/13/2009   Cancer of right breast (HCC) 04/16/2012   Depression    Depression 2014   Dysrhythmia    palpitations   Epilepsy (HCC)    last seizure 1990's   H/O hiatal hernia    Localization-related (focal) (partial) epilepsy and epileptic syndromes with complex partial seizures, without mention of intractable epilepsy 02/07/2013   Memory deficit 02/07/2013   Memory loss    Mitral valve prolapse 06/01/10   echo- EF>55% mild tricupsid regurgitation. There is mild Pulmonary Hypertension.. Right  ventricular systolic pressure iselevated at 30-40 mmHg. LV systolic function is normal.   Osteopenia    PONV (postoperative nausea and vomiting)    SOB (shortness of breath) 10/18/11   met-test   Spinal fusion failure    3    Past Surgical History:  Procedure Laterality Date   BACK SURGERY     screws- lumbar   BREAST SURGERY  2010   mastectomy - RIGHT   BRONCHIAL WASHINGS  04/01/2021   Procedure: BRONCHIAL WASHINGS;  Surgeon: Josephine Igo, DO;  Location: [facility];  Service: Cardiopulmonary;;   COLONOSCOPY WITH PROPOFOL  03/13/2012   Procedure: COLONOSCOPY WITH PROPOFOL;  Surgeon: Charolett Bumpers, MD;  Location: [facility];  Service: Endoscopy;  Laterality: N/A;   ESOPHAGOGASTRODUODENOSCOPY  03/13/2012   Procedure: ESOPHAGOGASTRODUODENOSCOPY (EGD);  Surgeon: Charolett Bumpers, MD;  Location: [facility];  Service: Endoscopy;  Laterality: N/A;  Reflux, Hiatal Hernia   HAND TENDON SURGERY  2012   tendons released in right hand    MASTECTOMY, RADICAL  02/13/2009   SPINE SURGERY  1999, 2003, 2005   TONSILLECTOMY     VIDEO BRONCHOSCOPY Bilateral 04/01/2021   Procedure: VIDEO BRONCHOSCOPY WITHOUT FLUORO;  Surgeon: Tonia Brooms,  Rachel Bo, DO;  Location: [facility];  Service: Cardiopulmonary;  Laterality: Bilateral;  Ultrathin bronchoscopy    Allergies  Allergen Reactions   Carbamazepine Anaphylaxis   Lamictal [Lamotrigine] Anaphylaxis    SOB, Lamotrigine only   Other     STATES HAS SHORTNESS OF BREATH WITH GENERIC DRUGS     ALSO RASH AND ITCHING   Prednisone Other (See Comments), Rash and Shortness Of Breath    Prescribing MD told patient he thinks dose (dose pack) was "just too high.", 03/09/11   STATES HAS TAKEN SMALLER DOSE AND STILL HAD RASH AND JITTERINESS, Prescribing MD told patient he thinks dose (dose pack) was "just too high.", 03/09/11   STATES HAS TAKEN SMALLER DOSE AND STILL HAD RASH AND JITTERINESS   Pseudoephedrine Hcl Er Other (See Comments)    SEIZURES.   Valium Other (See Comments)    Hallucinations, spasticity, hyper-relfexive    Amlodipine Rash    Face swell   Oxycodone Nausea And Vomiting, Rash and Other (See Comments)   Ibuprofen Swelling   Percocet [Oxycodone-Acetaminophen] Nausea And Vomiting   T[employer] Er] Other (See Comments)    Made headache worse, dizziness, difficulty breathing, rash   Betadine [Povidone Iodine] Rash   Latex Dermatitis and Rash   Shellfish Allergy Swelling    Current Outpatient Medications  Medication Sig Dispense Refill   aspirin-acetaminophen-caffeine (EXCEDRIN MIGRAINE) 250-250-65 MG tablet Take 1 tablet by  mouth every 8 (eight) hours as needed for headache.     atorvastatin (LIPITOR) 20 MG tablet Take 1 tablet (20 mg total) by mouth daily. 90 tablet 3   Cyanocobalamin (VITAMIN B-12) 3000 MCG/ML LIQD Inject as directed. Once a month     diltiazem (CARDIZEM) 30 MG tablet Take 30 mg by mouth 3 (three) times daily as needed (heart palpitations/increased heart rates).     EPINEPHrine 0.3 mg/0.3 mL IJ SOAJ injection Inject 0.3 mg into the muscle as needed for anaphylaxis.     gabapentin (NEURONTIN) 100 MG capsule Take 1 capsule (100 mg total) by mouth at bedtime. (Patient taking differently: Take 100 mg by mouth as needed.) 30 capsule 0   levETIRAcetam (KEPPRA) 750 MG tablet Take 1 tablet (750 mg total) by mouth 2 (two) times daily. 60 tablet 11   losartan (COZAAR) 25 MG tablet Take 1 tablet (25 mg total) by mouth daily. 90 tablet 3   meclizine (ANTIVERT) 25 MG tablet Take 25 mg by mouth 3 (three) times daily as needed for dizziness.     metoprolol succinate (TOPROL-XL) 100 MG 24 hr tablet TAKE 1 TABLET(100 MG) BY MOUTH DAILY WITH OR IMMEDIATELY FOLLOWING A MEAL 90 tablet 3   Misc. Devices MISC Please provide patient with right breast prosthesis and 6 mastectomy bras sue to a history of right breast cancer and mastectomy. (ICD10- C50.111) 6 each 0   Peppermint Oil (IBGARD PO) Take 90 mg by mouth daily.     sodium chloride (OCEAN) 0.65 % nasal spray Place 1-2 sprays into the nose as needed for congestion.     sucralfate (CARAFATE) 1 g tablet Take 1 g by mouth 2 (two) times daily.     ZYRTEC ALLERGY 10 MG tablet Take 10 mg by mouth at bedtime.     Cyanocobalamin (VITAMIN B12) 1000 MCG TBCR Take 1,000 mcg by mouth in the morning. (Patient not taking: Reported on 10/07/2022)     Tiotropium Bromide-Olodaterol (STIOLTO RESPIMAT) 2.5-2.5 MCG/ACT AERS Inhale 2 puffs into the lungs daily. (Patient not taking: Reported on  10/07/2022) 4 g 5   Vitamin D, Ergocalciferol, (DRISDOL) 1.25 MG (50000 UNIT) CAPS capsule Take 1  capsule (50,000 Units total) by mouth 2 (two) times a week. (Patient not taking: Reported on 10/07/2022) 10 capsule 11   No current facility-administered medications for this visit.    Social History   Socioeconomic History   Marital status: Married    Spouse name: Not on file   Number of children: 1   Years of education: college 2   Highest education level: Not on file  Occupational History   Occupation: unemployed    Employer: UNEMPLOYED    Comment: 2003  Tobacco Use   Smoking status: Every Day    Packs/day: 1.00    Years: 43.00    Additional pack years: 0.00    Total pack years: 43.00    Types: Cigarettes   Smokeless tobacco: Never   Tobacco comments:    1/2 pk per day  Substance and Sexual Activity   Alcohol use: No    Alcohol/week: 0.0 standard drinks of alcohol   Drug use: No   Sexual activity: Not on file  Other Topics Concern   Not on file  Social History Narrative   Patient lives at home with husband   Patient is right handed.   Patient does not drink caffeine.   Social Determinants of Health   Financial Resource Strain: Not on file  Food Insecurity: Not on file  Transportation Needs: Not on file  Physical Activity: Not on file  Stress: Not on file  Social Connections: Not on file  Intimate Partner Violence: Not on file    Social she is married and has one child. She has been smoking since age 51.  ROS General: Negative; No fevers, chills, or night sweats;  HEENT: Negative; No changes in vision or hearing, sinus congestion, difficulty swallowing Pulmonary: Negative; No cough, wheezing, shortness of breath, hemoptysis Cardiovascular: See HPI GI: Recent abdominal pain. GU: Negative; No dysuria, hematuria, or difficulty voiding Musculoskeletal: Neck and leg discomfort Hematologic/Oncology: Remote history of stage I breast CA of the right breast with recent evaluation felt to be stable. Endocrine: Negative; no heat/cold intolerance; no diabetes Neuro:  history of seizure disorder for which she had been on chronic Lamictal and Dilantin and was followed by Dr. Anne Hahn; previously she had been followed by Dr. Sandria Manly.  She was switched to Keppra due to cost since she could not take generic Lamictal.  Intermittent paresthesias in her extremities   Skin: Negative; No rashes or skin lesions Psychiatric: Negative; No behavioral problems, depression Sleep: Fatigability:  No snoring, daytime sleepiness, hypersomnolence, bruxism, restless legs, hypnogognic hallucinations, no cataplexy Other comprehensive 14 point system review is negative.   PE BP 134/82   Pulse 69   Ht 5\' 2"  (1.575 m)   Wt 141 lb (64 kg)   LMP 11/03/1993   SpO2 95%   BMI 25.79 kg/m    Repeat blood pressure by me was 138/82  Wt Readings from Last 3 Encounters:  10/07/22 141 lb (64 kg)  07/11/22 139 lb 3.2 oz (63.1 kg)  06/27/22 137 lb 6.4 oz (62.3 kg)   General: Alert, oriented, no distress.  Skin: normal turgor, no rashes, warm and dry HEENT: Normocephalic, atraumatic. Pupils equal round and reactive to light; sclera anicteric; extraocular muscles intact; Nose without nasal septal hypertrophy Mouth/Parynx benign; Mallinpatti scale 3 Neck: No JVD, no carotid bruits; normal carotid upstroke Lungs: clear to ausculatation and percussion; no wheezing or rales Chest wall:  without tenderness to palpitation Heart: PMI not displaced, RRR, s1 s2 normal, 1/6 systolic murmur, no diastolic murmur, no rubs, gallops, thrills, or heaves Abdomen: soft, nontender; no hepatosplenomehaly, BS+; abdominal aorta nontender and not dilated by palpation. Back: no CVA tenderness Pulses 2+ Musculoskeletal: full range of motion, normal strength, no joint deformities Extremities: no clubbing cyanosis or edema, Homan's sign negative  Neurologic: grossly nonfocal; Cranial nerves grossly wnl Psychologic: Normal mood and affect    EKG Interpretation  Date/Time:  Friday October 07 2022 13:00:20  EDT Ventricular Rate:  58 PR Interval:  158 QRS Duration: 80 QT Interval:  420 QTC Calculation: 412 R Axis:   44 Text Interpretation: Sinus bradycardia Possible Left atrial enlargement ST & T wave abnormality, consider inferior ischemia ST & T wave abnormality, consider anterolateral ischemia When compared with ECG of 09-Feb-2009 12:44, Inverted T waves have replaced nonspecific T wave abnormality in Lateral leads Since last ECG of 04/28/2021 inferolateral ST changes are slightly more prominent Confirmed by Nicki Guadalajara ([id number]) on 10/07/2022 1:07:10 PM     April 28, 2021  ECG (independently read by me):  NSR at 69, inferolateral STT changes  January 27, 2021 ECG (independently read by me):  NSR at 61, LAE, QS V1-2, inferolateral ST changes  February 08, 2019 ECG (independently read by me): Normal sinus rhythm at 66 bpm, the left atrial enlargement.  Previous inferior and anterolateral ST changes  November 07, 2018 ECG (independently read by me): Normal sinus rhythm at 69 bpm.  Possible left atrial enlargement.  Inferolateral T wave abnormality  October 2018 ECG (independently read by me): Normal sinus rhythm at 69 bpm.  Probable left atrial enlargement.  Inferolateral ST changes.  Normal intervals.  No ectopy.  June 2017 ECG (independently read by me): Normal sinus rhythm at 71 bpm.  Will biatrial enlargement.  Inferolateral ST-T wave abnormality.  Normal intervals.  March 2017 ECG (independently read by me):  Normal sinus rhythm at 77 bpm.  Biatrial enlargement.  Previously noted inferolateral T wave abnormalities.  November 2016 ECG (independently read by me): Normal sinus rhythm at 75 bpm.  Inferolateral ST-T wave abnormalities.  03/31/2014 ECG (independently read by me) : Normal sinus rhythm at 75 bpm.  There is now significantly more pronounced downsloping ST segment depression in leads II, III, and F V4 through V6 and T-wave inversion in V3 compared to her prior ECG of over one year  ago.  Prior ECG: Normal sinus rhythm at 68. T-wave abnormalities  inferiorly in V3 through V6  LABS:      Latest Ref Rng & Units 06/14/2022    8:24 AM 01/28/2022   10:05 AM 03/08/2021    2:04 PM  BMP  Glucose 70 - 99 mg/dL 578  92    BUN 8 - 23 mg/dL 17  13    Creatinine 4.69 - 1.00 mg/dL 6.29  5.28  4.13   Sodium 135 - 145 mmol/L 137  141    Potassium 3.5 - 5.1 mmol/L 3.6  3.9    Chloride 98 - 111 mmol/L 103  110    CO2 22 - 32 mmol/L 25  24    Calcium 8.9 - 10.3 mg/dL 8.9  8.7         Latest Ref Rng & Units 06/14/2022    8:24 AM 01/28/2022   10:05 AM 12/02/2020   12:10 PM  Hepatic Function  Total Protein 6.5 - 8.1 g/dL 6.9  6.7  6.9   Albumin 3.5 -  5.0 g/dL 3.9  3.8  4.1   AST 15 - 41 U/L 18  16  20    ALT 0 - 44 U/L 17  15  24    Alk Phosphatase 38 - 126 U/L 78  112  113   Total Bilirubin 0.3 - 1.2 mg/dL 0.9  0.8  0.5        Latest Ref Rng & Units 01/28/2022   10:05 AM 06/08/2021   10:38 AM 12/02/2020   12:10 PM  CBC  WBC 4.0 - 10.5 K/uL 7.3  7.0  7.7   Hemoglobin 12.0 - 15.0 g/dL 16.1  09.6  04.5   Hematocrit 36.0 - 46.0 % 46.9  48.4  49.4   Platelets 150 - 400 K/uL 158  148  166     Lab Results  Component Value Date   MCV 89.5 01/28/2022   MCV 90.0 06/08/2021   MCV 91.7 12/02/2020    Lab Results  Component Value Date   TSH 1.625 06/14/2022     Lab Results  Component Value Date   MCV 89.5 01/28/2022   MCV 90.0 06/08/2021   MCV 91.7 12/02/2020   Lab Results  Component Value Date   TSH 1.625 06/14/2022  No results found for: "HGBA1C"   Lipid Panel     Component Value Date/Time   CHOL 216 (H) 06/14/2022 0825   CHOL 170 02/17/2017 0820   TRIG 71 06/14/2022 0825   HDL 66 06/14/2022 0825   HDL 70 02/17/2017 0820   CHOLHDL 3.3 06/14/2022 0825   VLDL 14 06/14/2022 0825   LDLCALC 136 (H) 06/14/2022 0825   LDLCALC 87 02/17/2017 0820    RADIOLOGY: No results found.  IMPRESSION:  1. Essential hypertension   2. Pure hypercholesterolemia    3. Tobacco abuse   4. Palpitations   5. Malignant neoplasm of central portion of right breast in female, estrogen receptor positive (HCC)   6. Seizure disorder [facility])     ASSESSMENT AND PLAN: Ms. Suraya Vidrine is a 62 year old female who has a long-standing tobacco history. In January 2009  cardiac catheterization at the [facility] revealed normal coronary arteries. A nuclear perfusion study in 2012 showed normal perfusion. A cardiopulmonary met test in July 2013 showed blunted chronotropic response without ischemic changes but she had reduced functional status with peak O2 at 69% of predicted. She has mild pulmonary hypertension.  An echo revealed normal function without wall motion abnormalities despite her ECG changes.  A subsequent stress test in 2015 which was done because of more abnormal ST-T changes remain stable. A repeat echo Doppler study in November 2016 was unchanged and showed an EF of 60-65%.  There was trivial TR and MR.  Pulmonary pressures were normal.  In November 2019 due to recurrent intermittent episodes of chest tightness a Lexiscan Myoview study continued to show normal perfusion and was low risk.  She had been on Toprol-XL 100 mg twice a day with her history of increased heart rates.  Her event monitor from July 30 through November 28, 2018 revealed predominant sinus rhythm with average heart beat at 70 bpm.  There was mild sinus bradycardia which occurred during sleep.  Her fastest heart rate was sinus tachycardia at 112 bpm at 7 PM while active.  She did not have any episodes of AF, SVT ectopy or prolonged bradycardia.  Dr. Anne Hahn felt that her neuropathy most likely is due to her prior chemotherapy for which she had undergone for her breast cancer over  10 years ago.  A subsequent cardiac monitor in October 2021 showed predominant sinus rhythm but she had short-lived bursts of SVT with the fastest lasting 6 beats at a maximum rate of 176 bpm and the longest lasting 15  beats with an average rate of 103 bpm.  Most recently, she has been on a metoprolol succinate regimen of just 100 mg daily.  Presently, she is unaware of any recurrent episodes of palpitations or tachycardia dysrhythmia and this has been well-controlled with metoprolol succinate 100 mg daily.  She has been only on losartan 25 mg.  Blood pressure today is consistent with stage I hypertension.  I have suggested slight titration of losartan from 25 mg up to 50 mg daily.  With her tobacco history, and documentation of aortic atherosclerosis she was started on a trial of rosuvastatin which she did not tolerate.  Another physician gave her prescription for low-dose atorvastatin at 10 mg but she had not yet tried this.  I have recommended she try instituting atorvastatin 10 mg for the next several weeks.  If she tolerates this well I have suggested slight titration up to 20 mg if at all possible.  She will be undergoing EGD and colonoscopy next week with [facility].  I again discussed the importance of complete smoking cessation.  She has not had any recurrent seizure activity on Keppra and is no longer on Dilantin.  She will be undergoing neurologic evaluation with Dr. Teresa Coombs in September and follow-up oncologic evaluation with Dr. Ellin Saba in October.  I have recommended in October she undergo follow-up laboratory in the fasting state and I will see her in November for reevaluation.   Lennette Bihari, MD, [facility]  10/08/2022 2:38 PM

## 2022-10-08 ENCOUNTER — Encounter: Payer: Self-pay | Admitting: Cardiovascular Disease

## 2022-10-13 DIAGNOSIS — Z1211 Encounter for screening for malignant neoplasm of colon: Secondary | ICD-10-CM | POA: Diagnosis not present

## 2022-10-13 DIAGNOSIS — R112 Nausea with vomiting, unspecified: Secondary | ICD-10-CM | POA: Diagnosis not present

## 2022-10-13 DIAGNOSIS — R1013 Epigastric pain: Secondary | ICD-10-CM | POA: Diagnosis not present

## 2022-10-13 DIAGNOSIS — K6389 Other specified diseases of intestine: Secondary | ICD-10-CM | POA: Diagnosis not present

## 2022-10-13 DIAGNOSIS — K635 Polyp of colon: Secondary | ICD-10-CM | POA: Diagnosis not present

## 2022-10-13 DIAGNOSIS — D122 Benign neoplasm of ascending colon: Secondary | ICD-10-CM | POA: Diagnosis not present

## 2022-10-13 DIAGNOSIS — K449 Diaphragmatic hernia without obstruction or gangrene: Secondary | ICD-10-CM | POA: Diagnosis not present

## 2022-10-13 DIAGNOSIS — K621 Rectal polyp: Secondary | ICD-10-CM | POA: Diagnosis not present

## 2022-10-13 DIAGNOSIS — K2950 Unspecified chronic gastritis without bleeding: Secondary | ICD-10-CM | POA: Diagnosis not present

## 2022-10-13 DIAGNOSIS — K319 Disease of stomach and duodenum, unspecified: Secondary | ICD-10-CM | POA: Diagnosis not present

## 2022-10-23 ENCOUNTER — Encounter (HOSPITAL_COMMUNITY): Payer: Self-pay

## 2022-10-23 ENCOUNTER — Inpatient Hospital Stay (HOSPITAL_COMMUNITY)
Admission: EM | Admit: 2022-10-23 | Discharge: 2022-10-25 | DRG: 322 | Disposition: A | Discharge status: Home / Self Care | Payer: Medicare Other | Attending: Cardiovascular Disease | Admitting: Cardiovascular Disease

## 2022-10-23 ENCOUNTER — Other Ambulatory Visit: Payer: Self-pay

## 2022-10-23 ENCOUNTER — Emergency Department (HOSPITAL_COMMUNITY): Payer: Medicare Other

## 2022-10-23 ENCOUNTER — Inpatient Hospital Stay (HOSPITAL_COMMUNITY)
Admission: EM | Disposition: A | Discharge status: Home / Self Care | Payer: Self-pay | Source: Home / Self Care | Attending: Cardiovascular Disease

## 2022-10-23 DIAGNOSIS — I081 Rheumatic disorders of both mitral and tricuspid valves: Secondary | ICD-10-CM | POA: Diagnosis present

## 2022-10-23 DIAGNOSIS — I10 Essential (primary) hypertension: Secondary | ICD-10-CM | POA: Diagnosis present

## 2022-10-23 DIAGNOSIS — I2111 ST elevation (STEMI) myocardial infarction involving right coronary artery: Secondary | ICD-10-CM | POA: Diagnosis not present

## 2022-10-23 DIAGNOSIS — Z955 Presence of coronary angioplasty implant and graft: Secondary | ICD-10-CM

## 2022-10-23 DIAGNOSIS — E876 Hypokalemia: Secondary | ICD-10-CM | POA: Diagnosis not present

## 2022-10-23 DIAGNOSIS — I471 Supraventricular tachycardia, unspecified: Secondary | ICD-10-CM | POA: Diagnosis present

## 2022-10-23 DIAGNOSIS — Z885 Allergy status to narcotic agent status: Secondary | ICD-10-CM

## 2022-10-23 DIAGNOSIS — I493 Ventricular premature depolarization: Secondary | ICD-10-CM | POA: Diagnosis not present

## 2022-10-23 DIAGNOSIS — I272 Pulmonary hypertension, unspecified: Secondary | ICD-10-CM | POA: Diagnosis not present

## 2022-10-23 DIAGNOSIS — F419 Anxiety disorder, unspecified: Secondary | ICD-10-CM | POA: Diagnosis present

## 2022-10-23 DIAGNOSIS — Z888 Allergy status to other drugs, medicaments and biological substances status: Secondary | ICD-10-CM | POA: Diagnosis not present

## 2022-10-23 DIAGNOSIS — F17210 Nicotine dependence, cigarettes, uncomplicated: Secondary | ICD-10-CM | POA: Diagnosis present

## 2022-10-23 DIAGNOSIS — I2510 Atherosclerotic heart disease of native coronary artery without angina pectoris: Secondary | ICD-10-CM

## 2022-10-23 DIAGNOSIS — F32A Depression, unspecified: Secondary | ICD-10-CM | POA: Diagnosis present

## 2022-10-23 DIAGNOSIS — Z79899 Other long term (current) drug therapy: Secondary | ICD-10-CM

## 2022-10-23 DIAGNOSIS — R002 Palpitations: Secondary | ICD-10-CM | POA: Diagnosis present

## 2022-10-23 DIAGNOSIS — Z9011 Acquired absence of right breast and nipple: Secondary | ICD-10-CM | POA: Diagnosis not present

## 2022-10-23 DIAGNOSIS — E785 Hyperlipidemia, unspecified: Secondary | ICD-10-CM | POA: Diagnosis not present

## 2022-10-23 DIAGNOSIS — Z91013 Allergy to seafood: Secondary | ICD-10-CM

## 2022-10-23 DIAGNOSIS — I499 Cardiac arrhythmia, unspecified: Secondary | ICD-10-CM | POA: Diagnosis not present

## 2022-10-23 DIAGNOSIS — I2119 ST elevation (STEMI) myocardial infarction involving other coronary artery of inferior wall: Secondary | ICD-10-CM | POA: Diagnosis present

## 2022-10-23 DIAGNOSIS — Z853 Personal history of malignant neoplasm of breast: Secondary | ICD-10-CM | POA: Diagnosis not present

## 2022-10-23 DIAGNOSIS — G40909 Epilepsy, unspecified, not intractable, without status epilepticus: Secondary | ICD-10-CM | POA: Diagnosis not present

## 2022-10-23 DIAGNOSIS — I469 Cardiac arrest, cause unspecified: Secondary | ICD-10-CM | POA: Diagnosis not present

## 2022-10-23 DIAGNOSIS — Z8249 Family history of ischemic heart disease and other diseases of the circulatory system: Secondary | ICD-10-CM | POA: Diagnosis not present

## 2022-10-23 DIAGNOSIS — Z91040 Latex allergy status: Secondary | ICD-10-CM | POA: Diagnosis not present

## 2022-10-23 DIAGNOSIS — Z720 Tobacco use: Secondary | ICD-10-CM | POA: Diagnosis present

## 2022-10-23 DIAGNOSIS — I213 ST elevation (STEMI) myocardial infarction of unspecified site: Principal | ICD-10-CM

## 2022-10-23 DIAGNOSIS — R0789 Other chest pain: Secondary | ICD-10-CM | POA: Diagnosis not present

## 2022-10-23 DIAGNOSIS — Z8261 Family history of arthritis: Secondary | ICD-10-CM

## 2022-10-23 HISTORY — PX: CORONARY/GRAFT ACUTE MI REVASCULARIZATION: CATH118305

## 2022-10-23 HISTORY — PX: LEFT HEART CATH AND CORONARY ANGIOGRAPHY: CATH118249

## 2022-10-23 LAB — COMPREHENSIVE METABOLIC PANEL
ALT: 30 U/L (ref 0–44)
AST: 43 U/L — ABNORMAL HIGH (ref 15–41)
Albumin: 3.3 g/dL — ABNORMAL LOW (ref 3.5–5.0)
Alkaline Phosphatase: 67 U/L (ref 38–126)
Anion gap: 15 (ref 5–15)
BUN: 12 mg/dL (ref 8–23)
CO2: 22 mmol/L (ref 22–32)
Calcium: 8.5 mg/dL — ABNORMAL LOW (ref 8.9–10.3)
Chloride: 105 mmol/L (ref 98–111)
Creatinine, Ser: 0.95 mg/dL (ref 0.44–1.00)
GFR, Estimated: >60 mL/min (ref >60)
Glucose, Bld: 149 mg/dL — ABNORMAL HIGH (ref 70–99)
Potassium: 3.9 mmol/L (ref 3.5–5.1)
Sodium: 142 mmol/L (ref 135–145)
Total Bilirubin: 1.4 mg/dL — ABNORMAL HIGH (ref 0.3–1.2)
Total Protein: 5.8 g/dL — ABNORMAL LOW (ref 6.5–8.1)

## 2022-10-23 LAB — POCT I-STAT, CHEM 8
BUN: 13 mg/dL (ref 8–23)
Calcium, Ion: 1.11 mmol/L — ABNORMAL LOW (ref 1.15–1.40)
Chloride: 99 mmol/L (ref 98–111)
Creatinine, Ser: 0.6 mg/dL (ref 0.44–1.00)
Glucose, Bld: 145 mg/dL — ABNORMAL HIGH (ref 70–99)
HCT: 43 % (ref 36.0–46.0)
Hemoglobin: 14.6 g/dL (ref 12.0–15.0)
Potassium: 2.9 mmol/L — ABNORMAL LOW (ref 3.5–5.1)
Sodium: 135 mmol/L (ref 135–145)
TCO2: 24 mmol/L (ref 22–32)

## 2022-10-23 LAB — CBC WITH DIFFERENTIAL/PLATELET
Abs Immature Granulocytes: 0 10*3/uL (ref 0.00–0.07)
Basophils Absolute: 0 10*3/uL (ref 0.0–0.1)
Basophils Relative: 0 %
Eosinophils Absolute: 0.5 10*3/uL (ref 0.0–0.5)
Eosinophils Relative: 4 %
HCT: 44.5 % (ref 36.0–46.0)
Hemoglobin: 15.2 g/dL — ABNORMAL HIGH (ref 12.0–15.0)
Lymphocytes Relative: 47 %
Lymphs Abs: 6.3 10*3/uL — ABNORMAL HIGH (ref 0.7–4.0)
MCH: 30 pg (ref 26.0–34.0)
MCHC: 34.2 g/dL (ref 30.0–36.0)
MCV: 87.8 fL (ref 80.0–100.0)
Monocytes Absolute: 1.1 10*3/uL — ABNORMAL HIGH (ref 0.1–1.0)
Monocytes Relative: 8 %
Neutro Abs: 5.5 10*3/uL (ref 1.7–7.7)
Neutrophils Relative %: 41 %
Platelets: 164 10*3/uL (ref 150–400)
RBC: 5.07 MIL/uL (ref 3.87–5.11)
RDW: 12.7 % (ref 11.5–15.5)
WBC: 13.3 10*3/uL — ABNORMAL HIGH (ref 4.0–10.5)
nRBC: 0 % (ref 0.0–0.2)
nRBC: 0 /100 WBC

## 2022-10-23 LAB — I-STAT CG4 LACTIC ACID, ED: Lactic Acid, Venous: 2.5 mmol/L (ref 0.5–1.9)

## 2022-10-23 LAB — LIPID PANEL
Cholesterol: 205 mg/dL — ABNORMAL HIGH (ref 0–200)
HDL: 64 mg/dL (ref >40)
LDL Cholesterol: 124 mg/dL — ABNORMAL HIGH (ref 0–99)
Total CHOL/HDL Ratio: 3.2 RATIO
Triglycerides: 83 mg/dL (ref <150)
VLDL: 17 mg/dL (ref 0–40)

## 2022-10-23 LAB — MAGNESIUM: Magnesium: 1.9 mg/dL (ref 1.7–2.4)

## 2022-10-23 LAB — MRSA NEXT GEN BY PCR, NASAL: MRSA by PCR Next Gen: NOT DETECTED

## 2022-10-23 LAB — APTT: aPTT: 28 seconds (ref 24–36)

## 2022-10-23 LAB — POCT ACTIVATED CLOTTING TIME: Activated Clotting Time: 605 seconds

## 2022-10-23 LAB — PROTIME-INR
INR: 0.9 (ref 0.8–1.2)
Prothrombin Time: 12.7 seconds (ref 11.4–15.2)

## 2022-10-23 LAB — TROPONIN I (HIGH SENSITIVITY)
Troponin I (High Sensitivity): 68 ng/L — ABNORMAL HIGH (ref <18)
Troponin I (High Sensitivity): >24000 ng/L (ref <18)

## 2022-10-23 SURGERY — CORONARY/GRAFT ACUTE MI REVASCULARIZATION
Anesthesia: LOCAL

## 2022-10-23 MED ORDER — HEPARIN SODIUM (PORCINE) 5000 UNIT/ML IJ SOLN: 4000.0000 [IU] | Freq: Once | INTRAMUSCULAR | Status: AC

## 2022-10-23 MED ORDER — IOHEXOL 350 MG/ML SOLN
INTRAVENOUS | Status: DC | PRN
  Administered 2022-10-23: 60 mL

## 2022-10-23 MED ORDER — NOREPINEPHRINE 4 MG/250ML-% IV SOLN
0.0000 ug/min | INTRAVENOUS | Status: DC
  Filled 2022-10-23: qty 250

## 2022-10-23 MED ORDER — ATORVASTATIN CALCIUM 80 MG PO TABS
80.0000 mg | ORAL_TABLET | Freq: Every day | ORAL | Status: DC
  Administered 2022-10-24 – 2022-10-25 (×2): 80 mg via ORAL
  Filled 2022-10-23 (×2): qty 1

## 2022-10-23 MED ORDER — LIDOCAINE HCL (PF) 1 % IJ SOLN
INTRAMUSCULAR | Status: DC | PRN
  Administered 2022-10-23: 2 mL

## 2022-10-23 MED ORDER — FENTANYL CITRATE PF 50 MCG/ML IJ SOSY
25.0000 ug | PREFILLED_SYRINGE | Freq: Once | INTRAMUSCULAR | Status: DC
  Filled 2022-10-23: qty 1

## 2022-10-23 MED ORDER — ORAL CARE MOUTH RINSE: 15.0000 mL | OROMUCOSAL | Status: DC | PRN

## 2022-10-23 MED ORDER — HEPARIN SODIUM (PORCINE) 5000 UNIT/ML IJ SOLN
INTRAMUSCULAR | Status: AC
  Administered 2022-10-23: 5000 [IU]
  Filled 2022-10-23: qty 1

## 2022-10-23 MED ORDER — SODIUM CHLORIDE 0.9% FLUSH: 3.0000 mL | INTRAVENOUS | Status: DC | PRN

## 2022-10-23 MED ORDER — TICAGRELOR 90 MG PO TABS
ORAL_TABLET | ORAL | Status: DC | PRN
  Administered 2022-10-23: 180 mg via ORAL

## 2022-10-23 MED ORDER — FUROSEMIDE 10 MG/ML IJ SOLN
40.0000 mg | Freq: Every day | INTRAMUSCULAR | Status: AC
  Administered 2022-10-24: 40 mg via INTRAVENOUS
  Filled 2022-10-23: qty 4

## 2022-10-23 MED ORDER — POTASSIUM CHLORIDE 10 MEQ/100ML IV SOLN
10.0000 meq | INTRAVENOUS | Status: AC
  Administered 2022-10-23 (×2): 10 meq via INTRAVENOUS
  Filled 2022-10-23 (×2): qty 100

## 2022-10-23 MED ORDER — TIROFIBAN HCL IN NACL 5-0.9 MG/100ML-% IV SOLN
INTRAVENOUS | Status: AC | PRN
  Administered 2022-10-23: .15 ug/kg/min via INTRAVENOUS

## 2022-10-23 MED ORDER — HEPARIN SODIUM (PORCINE) 1000 UNIT/ML IJ SOLN
INTRAMUSCULAR | Status: AC
  Filled 2022-10-23: qty 10

## 2022-10-23 MED ORDER — POTASSIUM CHLORIDE CRYS ER 20 MEQ PO TBCR
40.0000 meq | EXTENDED_RELEASE_TABLET | Freq: Two times a day (BID) | ORAL | Status: DC
  Administered 2022-10-23: 40 meq via ORAL
  Filled 2022-10-23: qty 2

## 2022-10-23 MED ORDER — VERAPAMIL HCL 2.5 MG/ML IV SOLN
INTRAVENOUS | Status: DC | PRN
  Administered 2022-10-23: 10 mL via INTRA_ARTERIAL

## 2022-10-23 MED ORDER — CHLORHEXIDINE GLUCONATE CLOTH 2 % EX PADS
6.0000 | MEDICATED_PAD | Freq: Every day | CUTANEOUS | Status: DC
  Administered 2022-10-23: 6 via TOPICAL

## 2022-10-23 MED ORDER — TIROFIBAN (AGGRASTAT) BOLUS VIA INFUSION
INTRAVENOUS | Status: DC | PRN
  Administered 2022-10-23: 1657.5 ug via INTRAVENOUS

## 2022-10-23 MED ORDER — LIDOCAINE HCL (PF) 1 % IJ SOLN
INTRAMUSCULAR | Status: AC
  Filled 2022-10-23: qty 30

## 2022-10-23 MED ORDER — TICAGRELOR 90 MG PO TABS
ORAL_TABLET | ORAL | Status: AC
  Filled 2022-10-23: qty 2

## 2022-10-23 MED ORDER — FENTANYL CITRATE (PF) 100 MCG/2ML IJ SOLN
INTRAMUSCULAR | Status: AC
  Filled 2022-10-23: qty 2

## 2022-10-23 MED ORDER — HYDRALAZINE HCL 20 MG/ML IJ SOLN: 10.0000 mg | INTRAMUSCULAR | Status: AC | PRN

## 2022-10-23 MED ORDER — SODIUM CHLORIDE 0.9 % IV SOLN: INTRAVENOUS | Status: DC

## 2022-10-23 MED ORDER — ASPIRIN 81 MG PO CHEW
81.0000 mg | CHEWABLE_TABLET | Freq: Every day | ORAL | Status: DC
  Administered 2022-10-24 – 2022-10-25 (×2): 81 mg via ORAL
  Filled 2022-10-23 (×2): qty 1

## 2022-10-23 MED ORDER — LEVETIRACETAM 750 MG PO TABS
750.0000 mg | ORAL_TABLET | Freq: Two times a day (BID) | ORAL | Status: DC
  Administered 2022-10-23 – 2022-10-25 (×4): 750 mg via ORAL
  Filled 2022-10-23 (×4): qty 1

## 2022-10-23 MED ORDER — TICAGRELOR 90 MG PO TABS
90.0000 mg | ORAL_TABLET | Freq: Two times a day (BID) | ORAL | Status: DC
  Administered 2022-10-23 – 2022-10-25 (×4): 90 mg via ORAL
  Filled 2022-10-23 (×4): qty 1

## 2022-10-23 MED ORDER — TIROFIBAN HCL IN NACL 5-0.9 MG/100ML-% IV SOLN: 0.1500 ug/kg/min | INTRAVENOUS | Status: AC

## 2022-10-23 MED ORDER — ONDANSETRON HCL 4 MG/2ML IJ SOLN
INTRAMUSCULAR | Status: AC
  Filled 2022-10-23: qty 2

## 2022-10-23 MED ORDER — LOSARTAN POTASSIUM 25 MG PO TABS
25.0000 mg | ORAL_TABLET | Freq: Every day | ORAL | Status: DC
  Filled 2022-10-23 (×2): qty 1

## 2022-10-23 MED ORDER — NITROGLYCERIN 0.4 MG SL SUBL
SUBLINGUAL_TABLET | SUBLINGUAL | Status: AC
  Filled 2022-10-23: qty 1

## 2022-10-23 MED ORDER — LABETALOL HCL 5 MG/ML IV SOLN: 10.0000 mg | INTRAVENOUS | Status: AC | PRN

## 2022-10-23 MED ORDER — FUROSEMIDE 10 MG/ML IJ SOLN
INTRAMUSCULAR | Status: AC
  Filled 2022-10-23: qty 4

## 2022-10-23 MED ORDER — FENTANYL CITRATE PF 50 MCG/ML IJ SOSY
100.0000 ug | PREFILLED_SYRINGE | Freq: Once | INTRAMUSCULAR | Status: AC
  Administered 2022-10-23: 50 ug via INTRAVENOUS

## 2022-10-23 MED ORDER — SODIUM CHLORIDE 0.9% FLUSH
3.0000 mL | Freq: Two times a day (BID) | INTRAVENOUS | Status: DC
  Administered 2022-10-23 – 2022-10-25 (×5): 3 mL via INTRAVENOUS

## 2022-10-23 MED ORDER — FUROSEMIDE 10 MG/ML IJ SOLN
INTRAMUSCULAR | Status: DC | PRN
  Administered 2022-10-23: 40 mg via INTRAVENOUS

## 2022-10-23 MED ORDER — ONDANSETRON HCL 4 MG/2ML IJ SOLN
4.0000 mg | Freq: Four times a day (QID) | INTRAMUSCULAR | Status: DC | PRN
  Administered 2022-10-24 (×2): 4 mg via INTRAVENOUS
  Filled 2022-10-23 (×2): qty 2

## 2022-10-23 MED ORDER — METOPROLOL SUCCINATE ER 100 MG PO TB24
100.0000 mg | ORAL_TABLET | Freq: Every day | ORAL | Status: DC
  Filled 2022-10-23: qty 2

## 2022-10-23 MED ORDER — HEPARIN (PORCINE) IN NACL 1000-0.9 UT/500ML-% IV SOLN
INTRAVENOUS | Status: DC | PRN
  Administered 2022-10-23 (×2): 500 mL

## 2022-10-23 MED ORDER — VERAPAMIL HCL 2.5 MG/ML IV SOLN
INTRAVENOUS | Status: AC
  Filled 2022-10-23: qty 2

## 2022-10-23 MED ORDER — SODIUM CHLORIDE 0.9 % IV SOLN: 250.0000 mL | INTRAVENOUS | Status: DC | PRN

## 2022-10-23 MED ORDER — HEPARIN SODIUM (PORCINE) 1000 UNIT/ML IJ SOLN
INTRAMUSCULAR | Status: DC | PRN
  Administered 2022-10-23: 5000 [IU] via INTRAVENOUS

## 2022-10-23 SURGICAL SUPPLY — 19 items
BALL SAPPHIRE NC24 3.75X15 (BALLOONS) ×1
BALLN EMERGE MR 2.0X12 (BALLOONS) ×1
BALLOON EMERGE MR 2.0X12 (BALLOONS) IMPLANT
BALLOON SAPPHIRE NC24 3.75X15 (BALLOONS) IMPLANT
CATH 5FR JL3.5 JR4 ANG PIG MP (CATHETERS) IMPLANT
CATH VISTA GUIDE 6FR JR4 (CATHETERS) IMPLANT
DEVICE RAD COMP TR BAND LRG (VASCULAR PRODUCTS) IMPLANT
GLIDESHEATH SLEND SS 6F .021 (SHEATH) IMPLANT
GUIDEWIRE INQWIRE 1.5J.035X260 (WIRE) IMPLANT
INQWIRE 1.5J .035X260CM (WIRE) ×1
KIT ENCORE 26 ADVANTAGE (KITS) IMPLANT
KIT HEART LEFT (KITS) ×2 IMPLANT
PACK CARDIAC CATHETERIZATION (CUSTOM PROCEDURE TRAY) ×2 IMPLANT
STENT SYNERGY XD 3.50X28 (Permanent Stent) IMPLANT
SYNERGY XD 3.50X28 (Permanent Stent) ×1 IMPLANT
TRANSDUCER W/STOPCOCK (MISCELLANEOUS) ×2 IMPLANT
TUBING CIL FLEX 10 FLL-RA (TUBING) ×2 IMPLANT
WIRE ASAHI PROWATER 180CM (WIRE) IMPLANT
WIRE HI TORQ VERSACORE-J 145CM (WIRE) IMPLANT

## 2022-10-23 NOTE — ED Notes (Signed)
Cardiologist at bedside.  

## 2022-10-23 NOTE — ED Notes (Signed)
Pt transported to cath lab by primary RN on zoll monitor.

## 2022-10-23 NOTE — ED Triage Notes (Signed)
Pt BIB EMS from home with c/o chest pain onset 11am this morning. Pt c/o substernal chest pain and radiation up shoulders. PMHx- mitral valve prolapse. EMS administered 324mg  ASA en route.

## 2022-10-23 NOTE — H&P (Signed)
Cardiology Admission History and Physical   Patient ID: Wendy Lopez MRN: [id number]; DOB: 1960-11-28   Admission date: 10/23/2022  PCP:  Georgann Housekeeper, MD   [facility] Providers Cardiologist:  Nicki Guadalajara, MD        Chief Complaint:  Chest pain  History of Present Illness:   Wendy Lopez is a 62 yo female with history of tobacco abuse, HTN, seizure disorder, anxiety, depression, memory loss who presented to the ED with c/o chest pain for 1 hour. Central chest pressure with associated dyspnea. EKG with inferior ST elevation consistent with acute inferior MI. Code STEMI activated by the ED staff. Pt having 10/10 chest pain on my arrival to the ED.    Past Medical History:  Diagnosis Date   Anxiety and depression    Arthritis    Breast cancer (HCC) 02/13/2009   Cancer of right breast (HCC) 04/16/2012   Depression    Depression 2014   Dysrhythmia    palpitations   Epilepsy (HCC)    last seizure 1990's   H/O hiatal hernia    Localization-related (focal) (partial) epilepsy and epileptic syndromes with complex partial seizures, without mention of intractable epilepsy 02/07/2013   Memory deficit 02/07/2013   Memory loss    Mitral valve prolapse 06/01/10   echo- EF>55% mild tricupsid regurgitation. There is mild Pulmonary Hypertension.. Right  ventricular systolic pressure iselevated at 30-40 mmHg. LV systolic function is normal.   Osteopenia    PONV (postoperative nausea and vomiting)    SOB (shortness of breath) 10/18/11   met-test   Spinal fusion failure    3    Past Surgical History:  Procedure Laterality Date   BACK SURGERY     screws- lumbar   BREAST SURGERY  2010   mastectomy - RIGHT   BRONCHIAL WASHINGS  04/01/2021   Procedure: BRONCHIAL WASHINGS;  Surgeon: Josephine Igo, DO;  Location: [facility];  Service: Cardiopulmonary;;   COLONOSCOPY WITH PROPOFOL  03/13/2012   Procedure: COLONOSCOPY WITH PROPOFOL;  Surgeon: Charolett Bumpers, MD;   Location: [facility];  Service: Endoscopy;  Laterality: N/A;   ESOPHAGOGASTRODUODENOSCOPY  03/13/2012   Procedure: ESOPHAGOGASTRODUODENOSCOPY (EGD);  Surgeon: Charolett Bumpers, MD;  Location: [facility];  Service: Endoscopy;  Laterality: N/A;  Reflux, Hiatal Hernia   HAND TENDON SURGERY  2012   tendons released in right hand    MASTECTOMY, RADICAL  02/13/2009   SPINE SURGERY  1999, 2003, 2005   TONSILLECTOMY     VIDEO BRONCHOSCOPY Bilateral 04/01/2021   Procedure: VIDEO BRONCHOSCOPY WITHOUT FLUORO;  Surgeon: Josephine Igo, DO;  Location: [facility];  Service: Cardiopulmonary;  Laterality: Bilateral;  Ultrathin bronchoscopy     Medications Prior to Admission: Prior to Admission medications   Medication Sig Start Date End Date Taking? Authorizing Provider  aspirin-acetaminophen-caffeine (EXCEDRIN MIGRAINE) [phone] MG tablet Take 1 tablet by mouth every 8 (eight) hours as needed for headache.    [provider]  atorvastatin (LIPITOR) 20 MG tablet Take 1 tablet (20 mg total) by mouth daily. 10/07/22 01/05/23  Lennette Bihari, MD  Cyanocobalamin (VITAMIN B-12) 3000 MCG/ML LIQD Inject as directed. Once a month    [provider]  Cyanocobalamin (VITAMIN B12) 1000 MCG TBCR Take 1,000 mcg by mouth in the morning. Patient not taking: Reported on 10/07/2022    [provider]  diltiazem (CARDIZEM) 30 MG tablet Take 30 mg by mouth 3 (three) times daily as needed (heart palpitations/increased heart rates).  [provider]  EPINEPHrine 0.3 mg/0.3 mL IJ SOAJ injection Inject 0.3 mg into the muscle as needed for anaphylaxis.    [provider]  gabapentin (NEURONTIN) 100 MG capsule Take 1 capsule (100 mg total) by mouth at bedtime. Patient taking differently: Take 100 mg by mouth as needed. 07/11/22   Windell Norfolk, MD  levETIRAcetam (KEPPRA) 750 MG tablet Take 1 tablet (750 mg total) by mouth 2 (two) times daily. 02/25/22 02/20/23  Windell Norfolk, MD   losartan (COZAAR) 25 MG tablet Take 1 tablet (25 mg total) by mouth daily. 06/17/22 06/12/23  Lennette Bihari, MD  meclizine (ANTIVERT) 25 MG tablet Take 25 mg by mouth 3 (three) times daily as needed for dizziness. 01/28/20   [provider]  metoprolol succinate (TOPROL-XL) 100 MG 24 hr tablet TAKE 1 TABLET(100 MG) BY MOUTH DAILY WITH OR IMMEDIATELY FOLLOWING A MEAL 11/09/21   Lennette Bihari, MD  Misc. Devices MISC Please provide patient with right breast prosthesis and 6 mastectomy bras sue to a history of right breast cancer and mastectomy. (ICD10- C50.111) 08/09/16   Hubbard Hartshorn, NP  Peppermint Oil (IBGARD PO) Take 90 mg by mouth daily.    [provider]  sodium chloride (OCEAN) 0.65 % nasal spray Place 1-2 sprays into the nose as needed for congestion.    [provider]  sucralfate (CARAFATE) 1 g tablet Take 1 g by mouth 2 (two) times daily. 09/19/22   [provider]  Tiotropium Bromide-Olodaterol (STIOLTO RESPIMAT) 2.5-2.5 MCG/ACT AERS Inhale 2 puffs into the lungs daily. Patient not taking: Reported on 10/07/2022 06/14/21   Josephine Igo, DO  Vitamin D, Ergocalciferol, (DRISDOL) 1.25 MG (50000 UNIT) CAPS capsule Take 1 capsule (50,000 Units total) by mouth 2 (two) times a week. Patient not taking: Reported on 10/07/2022 02/10/22   Doreatha Massed, MD  ZYRTEC ALLERGY 10 MG tablet Take 10 mg by mouth at bedtime.    [provider]     Allergies:    Allergies  Allergen Reactions   Carbamazepine Anaphylaxis   Lamictal [Lamotrigine] Anaphylaxis    SOB, Lamotrigine only   Other     STATES HAS SHORTNESS OF BREATH WITH GENERIC DRUGS     ALSO RASH AND ITCHING   Prednisone Other (See Comments), Rash and Shortness Of Breath    Prescribing MD told patient he thinks dose (dose pack) was "just too high.", 03/09/11   STATES HAS TAKEN SMALLER DOSE AND STILL HAD RASH AND JITTERINESS, Prescribing MD told patient he thinks dose (dose pack) was "just  too high.", 03/09/11   STATES HAS TAKEN SMALLER DOSE AND STILL HAD RASH AND JITTERINESS   Pseudoephedrine Hcl Er Other (See Comments)    SEIZURES.   Valium Other (See Comments)    Hallucinations, spasticity, hyper-relfexive    Amlodipine Rash    Face swell   Oxycodone Nausea And Vomiting, Rash and Other (See Comments)   Ibuprofen Swelling   Percocet [Oxycodone-Acetaminophen] Nausea And Vomiting   Trokendi [employer] Er] Other (See Comments)    Made headache worse, dizziness, difficulty breathing, rash   Betadine [Povidone Iodine] Rash   Latex Dermatitis and Rash   Shellfish Allergy Swelling    Social History:   Social History   Socioeconomic History   Marital status: Married    Spouse name: Not on file   Number of children: 1   Years of education: college 2   Highest education level: Not on file  Occupational History  Occupation: unemployed    Associate Professor: UNEMPLOYED    Comment: 2003  Tobacco Use   Smoking status: Every Day    Packs/day: 1.00    Years: 43.00    Additional pack years: 0.00    Total pack years: 43.00    Types: Cigarettes   Smokeless tobacco: Never   Tobacco comments:    1/2 pk per day  Substance and Sexual Activity   Alcohol use: No    Alcohol/week: 0.0 standard drinks of alcohol   Drug use: No   Sexual activity: Not on file  Other Topics Concern   Not on file  Social History Narrative   Patient lives at home with husband   Patient is right handed.   Patient does not drink caffeine.   Social Determinants of Health   Financial Resource Strain: Not on file  Food Insecurity: Not on file  Transportation Needs: Not on file  Physical Activity: Not on file  Stress: Not on file  Social Connections: Not on file  Intimate Partner Violence: Not on file    Family History:   The patient's family history includes Arthritis in her mother; Heart disease in her father, mother, and sister.    ROS:  Please see the history of present illness.  All  other ROS reviewed and negative.     Physical Exam/Data:   Vitals:   10/23/22 1211 10/23/22 1214 10/23/22 1218  BP: (!) 153/122  (!) 176/96  Pulse: 78  66  Resp: (!) 22  20  Temp:  (!) 97.5 F (36.4 C)   TempSrc:  Oral   SpO2: 98%  100%  Height: 5\' 6"  (1.676 m)     No intake or output data in the 24 hours ending 10/23/22 1221    10/07/2022    1:03 PM 07/11/2022    3:30 PM 06/27/2022    2:50 PM  Last 3 Weights  Weight (lbs) 141 lb 139 lb 3.2 oz 137 lb 6.4 oz  Weight (kg) 63.957 kg 63.141 kg 62.324 kg     Body mass index is 22.76 kg/m.  General:  Well nourished, well developed, appears uncomfortable HEENT: normal Neck: no JVD Vascular: No carotid bruits; Distal pulses 2+ bilaterally   Cardiac:  normal S1, S2; RRR; no murmur  Lungs:  clear to auscultation bilaterally, no wheezing, rhonchi or rales  Abd: soft, nontender, no hepatomegaly  Ext: no LE edema Musculoskeletal:  No deformities, BUE and BLE strength normal and equal Skin: warm and dry  Neuro:  CNs 2-12 intact, no focal abnormalities noted Psych:  Normal affect    EKG:  The ECG that was done was personally reviewed and demonstrates sinus with 5 mm ST elevation in the inferior leads with 3 mm ST elevation in the anterior leads  Relevant CV Studies:   Laboratory Data:  High Sensitivity Troponin:  No results for input(s): "TROPONINIHS" in the last 720 hours.    ChemistryNo results for input(s): "NA", "K", "CL", "CO2", "GLUCOSE", "BUN", "CREATININE", "CALCIUM", "MG", "GFRNONAA", "GFRAA", "ANIONGAP" in the last 168 hours.  No results for input(s): "PROT", "ALBUMIN", "AST", "ALT", "ALKPHOS", "BILITOT" in the last 168 hours. Lipids No results for input(s): "CHOL", "TRIG", "HDL", "LABVLDL", "LDLCALC", "CHOLHDL" in the last 168 hours. HematologyNo results for input(s): "WBC", "RBC", "HGB", "HCT", "MCV", "MCH", "MCHC", "RDW", "PLT" in the last 168 hours. Thyroid No results for input(s): "TSH", "FREET4" in the last 168  hours. BNPNo results for input(s): "BNP", "PROBNP" in the last 168 hours.  DDimer No  results for input(s): "DDIMER" in the last 168 hours.   Radiology/Studies:  No results found.   Assessment and Plan:   Acute inferior STEMI: Will plan emergent cardiac cath. Labs pending. 4000 units IV heparin given in the ED. ASA by EMS   Severity of Illness: The appropriate patient status for this patient is INPATIENT. Inpatient status is judged to be reasonable and necessary in order to provide the required intensity of service to ensure the patient's safety. The patient's presenting symptoms, physical exam findings, and initial radiographic and laboratory data in the context of their chronic comorbidities is felt to place them at high risk for further clinical deterioration. Furthermore, it is not anticipated that the patient will be medically stable for discharge from the hospital within 2 midnights of admission.   * I certify that at the point of admission it is my clinical judgment that the patient will require inpatient hospital care spanning beyond 2 midnights from the point of admission due to high intensity of service, high risk for further deterioration and high frequency of surveillance required.*   For questions or updates, please contact [facility] Please consult [email or web address] for contact info under     Signed, Verne Carrow, MD  10/23/2022 12:21 PM

## 2022-10-23 NOTE — Progress Notes (Signed)
   10/23/22 1300  Spiritual Encounters  Type of Visit Initial  Care provided to: Patient  Conversation partners present during encounter Nurse  Referral source Code page  Reason for visit Code  OnCall Visit Yes   Ch responded to code STEMI. There was no family. Pt went straight to Cath Lab. No follow-up needed at this time.

## 2022-10-23 NOTE — ED Provider Notes (Signed)
[place] EMERGENCY DEPARTMENT AT [facility] Provider Note   CSN: [id number] Arrival date & time: 10/23/22  1205     History  Chief Complaint  Patient presents with   Code STEMI    Wendy Lopez is a 62 y.o. female.  HPI Patient presenting with acute onset of substernal chest pain.  This occurred 1 hour prior to arrival.  Medical history includes seizures, anxiety, HTN, breast cancer, arthritis.  She is followed by [facility] (Dr. Tresa Endo).  EMS reports inferior elevations with reciprocal change on twelve-lead EKGs.  She did receive 324 of ASA prior to arrival.  EMS was unable to obtain blood pressure.  Current pain continues to be severe.  Husband noted pallor and diaphoresis on scene.  Husband reports intermittent chest pain prior to this episode.    Home Medications Prior to Admission medications   Medication Sig Start Date End Date Taking? Authorizing Provider  aspirin-acetaminophen-caffeine (EXCEDRIN MIGRAINE) [phone] MG tablet Take 1 tablet by mouth every 8 (eight) hours as needed for headache.    [provider]  atorvastatin (LIPITOR) 20 MG tablet Take 1 tablet (20 mg total) by mouth daily. 10/07/22 01/05/23  Lennette Bihari, MD  Cyanocobalamin (VITAMIN B-12) 3000 MCG/ML LIQD Inject as directed. Once a month    [provider]  Cyanocobalamin (VITAMIN B12) 1000 MCG TBCR Take 1,000 mcg by mouth in the morning. Patient not taking: Reported on 10/07/2022    [provider]  diltiazem (CARDIZEM) 30 MG tablet Take 30 mg by mouth 3 (three) times daily as needed (heart palpitations/increased heart rates).    [provider]  EPINEPHrine 0.3 mg/0.3 mL IJ SOAJ injection Inject 0.3 mg into the muscle as needed for anaphylaxis.    [provider]  gabapentin (NEURONTIN) 100 MG capsule Take 1 capsule (100 mg total) by mouth at bedtime. Patient taking differently: Take 100 mg by mouth as needed. 07/11/22   Windell Norfolk, MD   levETIRAcetam (KEPPRA) 750 MG tablet Take 1 tablet (750 mg total) by mouth 2 (two) times daily. 02/25/22 02/20/23  Windell Norfolk, MD  losartan (COZAAR) 25 MG tablet Take 1 tablet (25 mg total) by mouth daily. 06/17/22 06/12/23  Lennette Bihari, MD  meclizine (ANTIVERT) 25 MG tablet Take 25 mg by mouth 3 (three) times daily as needed for dizziness. 01/28/20   [provider]  metoprolol succinate (TOPROL-XL) 100 MG 24 hr tablet TAKE 1 TABLET(100 MG) BY MOUTH DAILY WITH OR IMMEDIATELY FOLLOWING A MEAL 11/09/21   Lennette Bihari, MD  Misc. Devices MISC Please provide patient with right breast prosthesis and 6 mastectomy bras sue to a history of right breast cancer and mastectomy. (ICD10- C50.111) 08/09/16   Hubbard Hartshorn, NP  Peppermint Oil (IBGARD PO) Take 90 mg by mouth daily.    [provider]  sodium chloride (OCEAN) 0.65 % nasal spray Place 1-2 sprays into the nose as needed for congestion.    [provider]  sucralfate (CARAFATE) 1 g tablet Take 1 g by mouth 2 (two) times daily. 09/19/22   [provider]  Tiotropium Bromide-Olodaterol (STIOLTO RESPIMAT) 2.5-2.5 MCG/ACT AERS Inhale 2 puffs into the lungs daily. Patient not taking: Reported on 10/07/2022 06/14/21   Josephine Igo, DO  Vitamin D, Ergocalciferol, (DRISDOL) 1.25 MG (50000 UNIT) CAPS capsule Take 1 capsule (50,000 Units total) by mouth 2 (two) times a week. Patient not taking: Reported on 10/07/2022 02/10/22   Doreatha Massed, MD  [facility] ALLERGY  10 MG tablet Take 10 mg by mouth at bedtime.    [provider]      Allergies    Carbamazepine, Lamictal [lamotrigine], Other, Prednisone, Pseudoephedrine hcl er, Valium, Amlodipine, Oxycodone, Ibuprofen, Percocet [oxycodone-acetaminophen], Trokendi xr [topiramate er], Betadine [povidone iodine], Latex, and Shellfish allergy    Review of Systems   Review of Systems  Constitutional:  Positive for diaphoresis.  Cardiovascular:  Positive for  chest pain.  Skin:  Positive for pallor.  All other systems reviewed and are negative.   Physical Exam Updated Vital Signs BP (!) 177/92   Pulse 69   Temp (!) 97.5 F (36.4 C) (Oral)   Resp 20   Ht 5\' 2"  (1.575 m)   Wt 66.3 kg   LMP 11/03/1993   SpO2 98%   BMI 26.75 kg/m  Physical Exam Vitals and nursing note reviewed.  Constitutional:      General: She is not in acute distress.    Appearance: She is well-developed and normal weight. She is ill-appearing. She is not toxic-appearing or diaphoretic.  HENT:     Head: Normocephalic and atraumatic.     Right Ear: External ear normal.     Left Ear: External ear normal.     Nose: Nose normal.     Mouth/Throat:     Mouth: Mucous membranes are moist.  Eyes:     Extraocular Movements: Extraocular movements intact.     Conjunctiva/sclera: Conjunctivae normal.  Cardiovascular:     Rate and Rhythm: Normal rate and regular rhythm.     Heart sounds: No murmur heard. Pulmonary:     Effort: Pulmonary effort is normal. No respiratory distress.     Breath sounds: Normal breath sounds. No wheezing or rales.  Abdominal:     General: There is no distension.     Palpations: Abdomen is soft.     Tenderness: There is no abdominal tenderness.  Musculoskeletal:        General: No swelling. Normal range of motion.     Cervical back: Normal range of motion and neck supple.  Skin:    General: Skin is warm and dry.     Coloration: Skin is pale. Skin is not jaundiced.  Neurological:     General: No focal deficit present.     Mental Status: She is alert.  Psychiatric:        Mood and Affect: Mood normal.        Behavior: Behavior normal.     ED Results / Procedures / Treatments   Labs (all labs ordered are listed, but only abnormal results are displayed) Labs Reviewed  I-STAT CG4 LACTIC ACID, ED - Abnormal; Notable for the following components:      Result Value   Lactic Acid, Venous 2.5 (*)    All other components within normal  limits  HEMOGLOBIN A1C  CBC WITH DIFFERENTIAL/PLATELET  PROTIME-INR  APTT  COMPREHENSIVE METABOLIC PANEL  LIPID PANEL  TROPONIN I (HIGH SENSITIVITY)    EKG EKG Interpretation Date/Time:  Sunday October 23 2022 12:12:11 EDT Ventricular Rate:  77 PR Interval:  155 QRS Duration:  84 QT Interval:  363 QTC Calculation: 411 R Axis:   86  Text Interpretation: Sinus rhythm Ventricular premature complex Inferoposterior infarct, acute (RCA) Anterolateral infarct, acute Probable RV involvement, suggest recording right precordial leads >>> Acute MI <<< Confirmed by Gloris Manchester ([id number]) on 10/23/2022 12:17:37 PM  Radiology No results found.  Procedures Procedures    Medications Ordered in ED Medications  0.9 %  sodium chloride infusion (has no administration in time range)  heparin injection 60 Units/kg (has no administration in time range)  fentaNYL (SUBLIMAZE) injection 100 mcg (has no administration in time range)  nitroGLYCERIN (NITROSTAT) 0.4 MG SL tablet (has no administration in time range)  heparin 5000 UNIT/ML injection (5,000 Units  Given by Other 10/23/22 1216)    ED Course/ Medical Decision Making/ A&P                             Medical Decision Making Amount and/or Complexity of Data Reviewed Labs: ordered. Radiology: ordered.  Risk Prescription drug management.   This patient presents to the ED for concern of chest pain, this involves an extensive number of treatment options, and is a complaint that carries with it a high risk of complications and morbidity.  The differential diagnosis includes ACS, GERD, pericarditis, dissection   Co morbidities that complicate the patient evaluation  seizures, anxiety, HTN, breast cancer, arthritis   Additional history obtained:  Additional history obtained from EMS, patient's husband External records from outside source obtained and reviewed including EMR   Lab Tests:  I Ordered, and personally interpreted labs.  The  pertinent results include: (Pending at time of transfer to Cath Lab)  Cardiac Monitoring: / EKG:  The patient was maintained on a cardiac monitor.  I personally viewed and interpreted the cardiac monitored which showed an underlying rhythm of: Sinus rhythm   Consultations Obtained:  I requested consultation with the cardiologist,  and discussed lab and imaging findings as well as pertinent plan - they recommend: Emergent Cath Lab   Problem List / ED Course / Critical interventions / Medication management  Patient presents for acute onset of substernal chest pain, severe in nature, with associated pallor and diaphoresis.  This occurred 1 hour prior to arrival.  EMS noted inferior ST segment elevations with reciprocal change.  Patient arrives as a code STEMI.  On arrival, patient is awake and alert.  She appears uncomfortable.  She is pale.  Current chest pain is severe.  EKG confirms inferolateral STEMI.  She received 324 ASA prior to arrival.  Bolus of heparin was ordered.  Fentanyl was ordered for analgesia.  Blood pressures in the ED are elevated.  Cardiology at bedside at time of patient arrival.  She was transported to Cath Lab. I ordered medication including heparin for STEMI; fentanyl for analgesia Reevaluation of the patient after these medicines showed that the patient improved I have reviewed the patients home medicines and have made adjustments as needed   Social Determinants of Health:  Has access to outpatient care  CRITICAL CARE Performed by: Gloris Manchester   Total critical care time: 20 minutes  Critical care time was exclusive of separately billable procedures and treating other patients.  Critical care was necessary to treat or prevent imminent or life-threatening deterioration.  Critical care was time spent personally by me on the following activities: development of treatment plan with patient and/or surrogate as well as nursing, discussions with consultants,  evaluation of patient's response to treatment, examination of patient, obtaining history from patient or surrogate, ordering and performing treatments and interventions, ordering and review of laboratory studies, ordering and review of radiographic studies, pulse oximetry and re-evaluation of patient's condition.          Final Clinical Impression(s) / ED Diagnoses Final diagnoses:  ST elevation myocardial infarction (STEMI), unspecified artery (HCC)    Rx /  DC Orders ED Discharge Orders     None         Gloris Manchester, MD 10/23/22 1225

## 2022-10-23 NOTE — ED Notes (Signed)
To cath lab NOW 

## 2022-10-23 NOTE — ED Notes (Signed)
Belongings given to husband.

## 2022-10-24 ENCOUNTER — Encounter (HOSPITAL_COMMUNITY): Payer: Self-pay | Admitting: Cardiovascular Disease

## 2022-10-24 ENCOUNTER — Other Ambulatory Visit (HOSPITAL_COMMUNITY): Payer: Self-pay

## 2022-10-24 ENCOUNTER — Inpatient Hospital Stay (HOSPITAL_COMMUNITY): Payer: Medicare Other

## 2022-10-24 DIAGNOSIS — I2510 Atherosclerotic heart disease of native coronary artery without angina pectoris: Secondary | ICD-10-CM | POA: Diagnosis not present

## 2022-10-24 DIAGNOSIS — I2119 ST elevation (STEMI) myocardial infarction involving other coronary artery of inferior wall: Secondary | ICD-10-CM | POA: Diagnosis not present

## 2022-10-24 LAB — HEMOGLOBIN A1C
Hgb A1c MFr Bld: 5.5 % (ref 4.8–5.6)
Mean Plasma Glucose: 111 mg/dL

## 2022-10-24 LAB — BASIC METABOLIC PANEL
Anion gap: 10 (ref 5–15)
BUN: 14 mg/dL (ref 8–23)
CO2: 22 mmol/L (ref 22–32)
Calcium: 8.9 mg/dL (ref 8.9–10.3)
Chloride: 104 mmol/L (ref 98–111)
Creatinine, Ser: 0.83 mg/dL (ref 0.44–1.00)
GFR, Estimated: >60 mL/min (ref >60)
Glucose, Bld: 125 mg/dL — ABNORMAL HIGH (ref 70–99)
Potassium: 4.8 mmol/L (ref 3.5–5.1)
Sodium: 136 mmol/L (ref 135–145)

## 2022-10-24 LAB — ECHOCARDIOGRAM COMPLETE
Area-P 1/2: 3.37 cm2
Est EF: 50
Height: 62 in
Single Plane A2C EF: 42.8 %
Weight: 2339.7 oz

## 2022-10-24 LAB — CBC
HCT: 45.2 % (ref 36.0–46.0)
Hemoglobin: 15.7 g/dL — ABNORMAL HIGH (ref 12.0–15.0)
MCH: 30.1 pg (ref 26.0–34.0)
MCHC: 34.7 g/dL (ref 30.0–36.0)
MCV: 86.8 fL (ref 80.0–100.0)
Platelets: 159 10*3/uL (ref 150–400)
RBC: 5.21 MIL/uL — ABNORMAL HIGH (ref 3.87–5.11)
RDW: 12.7 % (ref 11.5–15.5)
WBC: 11.1 10*3/uL — ABNORMAL HIGH (ref 4.0–10.5)
nRBC: 0 % (ref 0.0–0.2)

## 2022-10-24 MED ORDER — POTASSIUM CHLORIDE CRYS ER 20 MEQ PO TBCR
40.0000 meq | EXTENDED_RELEASE_TABLET | Freq: Two times a day (BID) | ORAL | Status: AC
  Administered 2022-10-25: 40 meq via ORAL
  Filled 2022-10-24: qty 2

## 2022-10-24 MED FILL — Ondansetron HCl Inj 4 MG/2ML (2 MG/ML): INTRAMUSCULAR | Qty: 2 | Status: AC

## 2022-10-24 MED FILL — Fentanyl Citrate Preservative Free (PF) Inj 100 MCG/2ML: INTRAMUSCULAR | Qty: 2 | Status: AC

## 2022-10-24 NOTE — TOC CM/SW Note (Signed)
Transition of Care [facility]) - Inpatient Brief Assessment   Patient Details  Name: Wendy Lopez MRN: [id number] Date of Birth: [birth year]  [facility] of [facility]) CM/SW Contact:    Gala Lewandowsky, RN Phone Number: 10/24/2022, 11:58 AM   Clinical Narrative: Patient presented for chest pain- Stemi. PTA patient was independent  from home with spouse. Patient has insurance, PCP, and she gets her medications from [facility] in [place]. No home needs identified at this time. Case Manager will continue to follow.    Transition of Care Asessment: Insurance and Status: Insurance coverage has been reviewed Patient has primary care physician: Yes Home environment has been reviewed: reviewed Prior level of function:: independent Prior/Current Home Services: No current home services Social Determinants of Health Reivew: SDOH reviewed no interventions necessary Readmission risk has been reviewed: Yes Transition of care needs: no transition of care needs at this time

## 2022-10-24 NOTE — Progress Notes (Signed)
Rounding Note    Patient Name: Wendy Lopez Date of Encounter: 10/24/2022  [facility] Cardiologist: Nicki Guadalajara, MD   Subjective   Postop day 1 inferior STEMI status post PCI and drug-eluting stenting by Dr. Clifton James.  Currently pain-free.  Inpatient Medications    Scheduled Meds:  aspirin  81 mg Oral Daily   atorvastatin  80 mg Oral Daily   Chlorhexidine Gluconate Cloth  6 each Topical Daily   furosemide  40 mg Intravenous Daily   levETIRAcetam  750 mg Oral BID   losartan  25 mg Oral Daily   metoprolol succinate  100 mg Oral Daily   potassium chloride  40 mEq Oral BID   sodium chloride flush  3 mL Intravenous Q12H   ticagrelor  90 mg Oral BID   Continuous Infusions:  sodium chloride Stopped (10/23/22 2023)   sodium chloride     PRN Meds: sodium chloride, ondansetron (ZOFRAN) IV, mouth rinse, sodium chloride flush   Vital Signs    Vitals:   10/24/22 0500 10/24/22 0600 10/24/22 0700 10/24/22 0758  BP: (!) 133/97 (!) 141/80 119/78   Pulse: 61 63 67   Resp: 19 16 17    Temp:    98.5 F (36.9 C)  TempSrc:    Oral  SpO2: 96% 96% 95%   Weight:      Height:        Intake/Output Summary (Last 24 hours) at 10/24/2022 0812 Last data filed at 10/23/2022 2256 Gross per 24 hour  Intake 633.53 ml  Output 1550 ml  Net -916.47 ml      10/23/2022   12:22 PM 10/07/2022    1:03 PM 07/11/2022    3:30 PM  Last 3 Weights  Weight (lbs) 146 lb 3.7 oz 141 lb 139 lb 3.2 oz  Weight (kg) 66.33 kg 63.957 kg 63.141 kg      Telemetry    Sinus rhythm with PVCs- Personally Reviewed  ECG    Sinus rhythm at 63 with small inferior Q waves, inferolateral T wave inversion in early R wave transition.- Personally Reviewed  Physical Exam   GEN: No acute distress.   Neck: No JVD Cardiac: RRR, no murmurs, rubs, or gallops.  Respiratory: Clear to auscultation bilaterally. GI: Soft, nontender, non-distended  MS: No edema; No deformity. Neuro:  Nonfocal  Psych: Normal  affect   Labs    High Sensitivity Troponin:   Recent Labs  Lab 10/23/22 1209 10/23/22 1420  TROPONINIHS 68* >24,000*     Chemistry Recent Labs  Lab 10/23/22 1209 10/23/22 1252 10/23/22 1638 10/24/22 0337  NA 142 135  --  136  K 3.9 2.9*  --  4.8  CL 105 99  --  104  CO2 22  --   --  22  GLUCOSE 149* 145*  --  125*  BUN 12 13  --  14  CREATININE 0.95 0.60  --  0.83  CALCIUM 8.5*  --   --  8.9  MG  --   --  1.9  --   PROT 5.8*  --   --   --   ALBUMIN 3.3*  --   --   --   AST 43*  --   --   --   ALT 30  --   --   --   ALKPHOS 67  --   --   --   BILITOT 1.4*  --   --   --   GFRNONAA >60  --   --  >  60  ANIONGAP 15  --   --  10    Lipids  Recent Labs  Lab 10/23/22 1209  CHOL 205*  TRIG 83  HDL 64  LDLCALC 124*  CHOLHDL 3.2    Hematology Recent Labs  Lab 10/23/22 1209 10/23/22 1252  WBC 13.3*  --   RBC 5.07  --   HGB 15.2* 14.6  HCT 44.5 43.0  MCV 87.8  --   MCH 30.0  --   MCHC 34.2  --   RDW 12.7  --   PLT 164  --    Thyroid No results for input(s): "TSH", "FREET4" in the last 168 hours.  BNPNo results for input(s): "BNP", "PROBNP" in the last 168 hours.  DDimer No results for input(s): "DDIMER" in the last 168 hours.   Radiology    CARDIAC CATHETERIZATION  Result Date: 10/23/2022   Mid RCA lesion is 99% stenosed.   A drug-eluting stent was successfully placed using a SYNERGY XD 3.50X28.   Post intervention, there is a 0% residual stenosis.   There is mild left ventricular systolic dysfunction.   LV end diastolic pressure is severely elevated.   The left ventricular ejection fraction is 45-50% by visual estimate. Acute inferior ST elevation MI secondary to thrombotic occlusion of the mid RCA Successful PTCA/DES x 1 mid RCA No disease in the LAD or Circumflex LVEDP=87mmHg Recommendations: Will admit to the ICU. Aggrastat infusion for two hours. DAPT with ASA and Brilinta for one year. High intensity statin. Beta blocker as BP tolerates. Echo tomorrow.  Lasix 40 mg IV given as patient was leaving the cath lab.    Cardiac Studies   Cardiac catheterization/PCI drug-eluting stent (10/23/2022)  Conclusion      Mid RCA lesion is 99% stenosed.   A drug-eluting stent was successfully placed using a SYNERGY XD 3.50X28.   Post intervention, there is a 0% residual stenosis.   There is mild left ventricular systolic dysfunction.   LV end diastolic pressure is severely elevated.   The left ventricular ejection fraction is 45-50% by visual estimate.   Acute inferior ST elevation MI secondary to thrombotic occlusion of the mid RCA Successful PTCA/DES x 1 mid RCA No disease in the LAD or Circumflex LVEDP=76mmHg   Recommendations: Will admit to the ICU. Aggrastat infusion for two hours. DAPT with ASA and Brilinta for one year. High intensity statin. Beta blocker as BP tolerates. Echo tomorrow. Lasix 40 mg IV given as patient was leaving the cath lab.  nary Diagrams  Diagnostic Dominance: Right  Intervention      Patient Profile     Wendy Lopez is a 62 yo female with history of tobacco abuse, HTN, seizure disorder, anxiety, depression, memory loss who presented to the ED with c/o chest pain for 1 hour. Central chest pressure with associated dyspnea. EKG with inferior ST elevation consistent with acute inferior MI. Code STEMI activated by the ED staff. Pt having 10/10 chest pain on my arrival to the ED.   Assessment & Plan    1: Inferior STEMI-postop day 1 large inferolateral STEMI secondary to subtotal occlusion of a mid to distal dominant RCA.  Her left system was free of disease.  She underwent successful PCI drug-eluting stenting by Dr. Clifton James with a 3.5 x 18 mm long Synergy drug-eluting stent.  Her troponins were greater than 24,000.  She had inferobasal hyper hypokinesis with an EF in the 45% range.  She is on aspirin and Brilinta which she will need  to be on uninterrupted for 12 months.  2: LV dysfunction-moderate inferobasal  hypokinesia with an EF of 45%.  She is on losartan and a beta-blocker.  2D echo is pending.  We will decide regarding an SGLT2 after the the result.  Her LVEDP was elevated at 30.  She was put on oral furosemide.  Will continue that today.  Her INR was -900 cc.  3: Hyperlipidemia-on low-dose atorvastatin which apparently she has not started as an outpatient.  This will be increased to 80 mg.  4: Essential hypertension-on losartan and beta-blocker with blood pressures that are therapeutic.  5: Tobacco abuse-Long history tobacco abuse interested in stopping.  Will discuss pharmacologic options.  Patient looks good postop day 1 large inferior STEMI.  She has some mild bruising in her right radial area.  Plan to transfer to telemetry, ambulate with anticipation of discharge tomorrow.  For questions or updates, please contact [facility] Please consult [email or web address] for contact info under        Signed, Nanetta Batty, MD  10/24/2022, 8:12 AM

## 2022-10-24 NOTE — Progress Notes (Signed)
CARDIAC REHAB PHASE I   PRE:  Rate/Rhythm: 76 SR  BP:  Sitting: 12/76      SaO2: 96 RA  MODE:  Ambulation: 340 ft   AD:   no AD  POST:  Rate/Rhythm: 94 SR  BP:  Sitting: 105/83      SaO2: 92 RA  Pt amb with supervision assistance, pt denies CP and SOB during amb and was returned to room w/o complaint.   Pt was educated on stent card, stent location, Antiplatelet and ASA use, wt restrictions, no baths/daily wash-ups, s/s of infection, ex guidelines, s/s to stop exercising, NTG use and calling 911, heart healthy diet, risk factors and CRPII. Pt received MI book and materials on exercise, diet, and CRPII. Will refer to [facility].   Referral placed to: [facility] under cardiologist Dr. Nicki Guadalajara Qualifying Dx: Charlotta Newton; 7/7 PCI      Faustino Congress  ACSM-CEP 12:05 PM 10/24/2022    Service time is from 1055 to 1205.

## 2022-10-24 NOTE — Progress Notes (Signed)
  Echocardiogram 2D Echocardiogram has been performed.  Wendy Lopez 10/24/2022, 10:41 AM

## 2022-10-24 NOTE — Progress Notes (Signed)
ECHO tech in room, handed family MI book and educated them on my role. Will f/u after echo.

## 2022-10-24 NOTE — TOC Benefit Eligibility Note (Signed)
Pharmacy Patient Advocate Encounter  Insurance verification completed.    The patient is insured through [employer] Medicare Part D  Ran test claim for Brilinta 90 mg and the current 30 day co-pay is $47.00.   This test claim was processed through [facility]- copay amounts may vary at other pharmacies due to pharmacy/plan contracts, or as the patient moves through the different stages of their insurance plan.    Dearies Meikle, CPHT Pharmacy Patient Advocate Specialist [facility] Patient Advocate Team Direct Number: [phone]  Fax: [phone] 

## 2022-10-25 ENCOUNTER — Other Ambulatory Visit (HOSPITAL_COMMUNITY): Payer: Self-pay

## 2022-10-25 ENCOUNTER — Other Ambulatory Visit: Payer: Self-pay

## 2022-10-25 DIAGNOSIS — I471 Supraventricular tachycardia, unspecified: Secondary | ICD-10-CM | POA: Insufficient documentation

## 2022-10-25 DIAGNOSIS — I2119 ST elevation (STEMI) myocardial infarction involving other coronary artery of inferior wall: Secondary | ICD-10-CM | POA: Diagnosis not present

## 2022-10-25 LAB — BASIC METABOLIC PANEL
Anion gap: 9 (ref 5–15)
BUN: 13 mg/dL (ref 8–23)
CO2: 27 mmol/L (ref 22–32)
Calcium: 8.7 mg/dL — ABNORMAL LOW (ref 8.9–10.3)
Chloride: 101 mmol/L (ref 98–111)
Creatinine, Ser: 0.9 mg/dL (ref 0.44–1.00)
GFR, Estimated: >60 mL/min (ref >60)
Glucose, Bld: 98 mg/dL (ref 70–99)
Potassium: 3.6 mmol/L (ref 3.5–5.1)
Sodium: 137 mmol/L (ref 135–145)

## 2022-10-25 LAB — MAGNESIUM: Magnesium: 2 mg/dL (ref 1.7–2.4)

## 2022-10-25 MED ORDER — METOPROLOL SUCCINATE ER 50 MG PO TB24
50.0000 mg | ORAL_TABLET | Freq: Every day | ORAL | 0 refills | Status: DC
  Filled 2022-10-25: qty 30, 30d supply, fill #0

## 2022-10-25 MED ORDER — NICOTINE 14 MG/24HR TD PT24
14.0000 mg | MEDICATED_PATCH | TRANSDERMAL | 0 refills | Status: AC
  Filled 2022-10-25 (×2): qty 30, 30d supply, fill #0

## 2022-10-25 MED ORDER — MAGNESIUM SULFATE IN D5W 1-5 GM/100ML-% IV SOLN
1.0000 g | Freq: Once | INTRAVENOUS | Status: AC
  Administered 2022-10-25: 1 g via INTRAVENOUS
  Filled 2022-10-25: qty 100

## 2022-10-25 MED ORDER — METOPROLOL SUCCINATE ER 50 MG PO TB24
50.0000 mg | ORAL_TABLET | Freq: Every day | ORAL | Status: DC
  Administered 2022-10-25: 50 mg via ORAL
  Filled 2022-10-25: qty 1

## 2022-10-25 MED ORDER — ASPIRIN 81 MG PO TBEC
81.0000 mg | DELAYED_RELEASE_TABLET | Freq: Every day | ORAL | 3 refills | Status: AC
  Filled 2022-10-25: qty 120, 120d supply, fill #0

## 2022-10-25 MED ORDER — NITROGLYCERIN 0.4 MG SL SUBL
0.4000 mg | SUBLINGUAL_TABLET | SUBLINGUAL | 2 refills | Status: DC | PRN
  Filled 2022-10-25: qty 25, 7d supply, fill #0

## 2022-10-25 MED ORDER — TICAGRELOR 90 MG PO TABS
90.0000 mg | ORAL_TABLET | Freq: Two times a day (BID) | ORAL | 2 refills | Status: DC
  Filled 2022-10-25: qty 60, 30d supply, fill #0

## 2022-10-25 MED ORDER — ATORVASTATIN CALCIUM 80 MG PO TABS
80.0000 mg | ORAL_TABLET | Freq: Every day | ORAL | 1 refills | Status: DC
  Filled 2022-10-25: qty 30, 30d supply, fill #0

## 2022-10-25 NOTE — Discharge Summary (Addendum)
Discharge Summary    Patient ID: SHAELEE Lopez MRN: [id number]; DOB: 1961/01/28  Admit date: 10/23/2022 Discharge date: 10/25/2022  PCP:  Georgann Housekeeper, MD   [facility] Providers Cardiologist:  Nicki Guadalajara, MD     Discharge Diagnoses    Principal Problem:   Acute ST elevation myocardial infarction (STEMI) of inferior wall [facility]) Active Problems:   HTN (hypertension)   Tobacco abuse   Palpitations   SVT (supraventricular tachycardia)   Diagnostic Studies/Procedures    Cath: 10/23/2022    Mid RCA lesion is 99% stenosed.   A drug-eluting stent was successfully placed using a SYNERGY XD 3.50X28.   Post intervention, there is a 0% residual stenosis.   There is mild left ventricular systolic dysfunction.   LV end diastolic pressure is severely elevated.   The left ventricular ejection fraction is 45-50% by visual estimate.   Acute inferior ST elevation MI secondary to thrombotic occlusion of the mid RCA Successful PTCA/DES x 1 mid RCA No disease in the LAD or Circumflex LVEDP=60mmHg   Recommendations: Will admit to the ICU. Aggrastat infusion for two hours. DAPT with ASA and Brilinta for one year. High intensity statin. Beta blocker as BP tolerates. Echo tomorrow. Lasix 40 mg IV given as patient was leaving the cath lab.  Diagnostic Dominance: Right  Intervention    _____________   History of Present Illness     Wendy Lopez is a 62 y.o. female with history of tobacco abuse, HTN, seizure disorder, anxiety, depression, memory loss who presented to the ED with c/o chest pain for 1 hour. Central chest pressure with associated dyspnea. EKG with inferior ST elevation consistent with acute inferior MI. Code STEMI activated by the ED staff. At the time of assessment, she complained of 10/10 chest pain. She was taken emergently to the cardiac cath lab.   Hospital Course     Inferior STEMI -- underwent cardiac cath noted above with 99% thrombotic occlusion of  the mRCA, treated with PCI/DES x1. Received aggrastat post cath x2 hrs. Recommendations for DAPT with ASA/Brilinta for at least one year. Worked well with cardiac rehab with no recurrent chest pain.  -- continue ASA/Brilinta, statin, Toprol XL  Mild LV dysfunction -- LV gram reported LVEF of 45-50%, follow up echo with LVEF of 50%, basal to mid inferoseptal and basal inferior akinesis, g1DD -- continue Toprol XL 50mg  daily   HLD -- LDL 124, HDL 64 -- continue atorvastatin 80mg  daily -- will need FLP/LFTs in 8 weeks   HTN -- has been on losartan and Toprol XL PTA, blood pressures soft during admission -- dropped Toprol XL from 100 to 50mg  daily  SVT PVCs -- known hx of the same, had several episodes of SVT around 160 bpm in the setting of caffeine use and BB being held -- reports she was initially started on Toprol b/c her palpitations and caffeine sensitivity -- she was continued on Toprol XL 50mg  daily at DC, will likely need to increase back to 100mg  daily at follow up if BP tolerating   Tobacco use -- cessation advised -- nicotine patches at DC  General: Well developed, well nourished, female appearing in no acute distress. Head: Normocephalic, atraumatic.  Neck: Supple without bruits, JVD. Lungs:  Resp regular and unlabored, CTA. Heart: RRR, S1, S2, no S3, S4, or murmur; no rub. Abdomen: Soft, non-tender, non-distended with normoactive bowel sounds. No hepatomegaly. No rebound/guarding. No obvious abdominal masses. Extremities: No clubbing, cyanosis, edema. Distal pedal pulses  are 2+ bilaterally. Right cath site stable with mild bruising, no hematoma Neuro: Alert and oriented X 3. Moves all extremities spontaneously. Psych: Normal affect.  Patient was seen by Dr. Allyson Sabal and deemed stable for discharge home. Follow up arranged in the office. Medications sent to the [facility] pharmacy. Educated by PharmD prior to discharge.   Did the patient have an acute coronary syndrome (MI,  NSTEMI, STEMI, etc) this admission?:  Yes                               AHA/ACC Clinical Performance & Quality Measures: Aspirin prescribed? - Yes ADP Receptor Inhibitor (Plavix/Clopidogrel, Brilinta/Ticagrelor or Effient/Prasugrel) prescribed (includes medically managed patients)? - Yes Beta Blocker prescribed? - Yes High Intensity Statin (Lipitor 40-80mg  or Crestor 20-40mg ) prescribed? - Yes EF assessed during THIS hospitalization? - Yes For EF <40%, was ACEI/ARB prescribed? - Not Applicable (EF >/= 40%) For EF <40%, Aldosterone Antagonist (Spironolactone or Eplerenone) prescribed? - Not Applicable (EF >/= 40%) Cardiac Rehab Phase II ordered (including medically managed patients)? - Yes   The patient will be scheduled for a TOC follow up appointment in 10-14 days.  A message has been sent to the [facility] and Scheduling Pool at the office where the patient should be seen for follow up.  _____________  Discharge Vitals Blood pressure 107/84, pulse 84, temperature 98.9 F (37.2 C), temperature source Oral, resp. rate 18, height 5\' 2"  (1.575 m), weight 66.3 kg, last menstrual period 11/03/1993, SpO2 92 %.  Filed Weights   10/23/22 1222  Weight: 66.3 kg    Labs & Radiologic Studies    CBC Recent Labs    10/23/22 1209 10/23/22 1252 10/24/22 0802  WBC 13.3*  --  11.1*  NEUTROABS 5.5  --   --   HGB 15.2* 14.6 15.7*  HCT 44.5 43.0 45.2  MCV 87.8  --  86.8  PLT 164  --  159   Basic Metabolic Panel Recent Labs    16/10/96 1638 10/24/22 0337 10/25/22 0157  NA  --  136 137  K  --  4.8 3.6  CL  --  104 101  CO2  --  22 27  GLUCOSE  --  125* 98  BUN  --  14 13  CREATININE  --  0.83 0.90  CALCIUM  --  8.9 8.7*  MG 1.9  --  2.0   Liver Function Tests Recent Labs    10/23/22 1209  AST 43*  ALT 30  ALKPHOS 67  BILITOT 1.4*  PROT 5.8*  ALBUMIN 3.3*   No results for input(s): "LIPASE", "AMYLASE" in the last 72 hours. High Sensitivity Troponin:   Recent Labs  Lab  10/23/22 1209 10/23/22 1420  TROPONINIHS 68* >24,000*    BNP Invalid input(s): "POCBNP" D-Dimer No results for input(s): "DDIMER" in the last 72 hours. Hemoglobin A1C Recent Labs    10/23/22 1216  HGBA1C 5.5   Fasting Lipid Panel Recent Labs    10/23/22 1209  CHOL 205*  HDL 64  LDLCALC 124*  TRIG 83  CHOLHDL 3.2   Thyroid Function Tests No results for input(s): "TSH", "T4TOTAL", "T3FREE", "THYROIDAB" in the last 72 hours.  Invalid input(s): "FREET3" _____________  ECHOCARDIOGRAM COMPLETE  Result Date: 10/24/2022    ECHOCARDIOGRAM REPORT   Patient Name:   Wendy Lopez Date of Exam: 10/24/2022 Medical Rec #:  [id number]         Height:  62.0 in Accession #:    [id number]        Weight:       146.2 lb Date of Birth:  1960/05/31          BSA:          1.673 m Patient Age:    61 years          BP:           119/78 mmHg Patient Gender: F                 HR:           66 bpm. Exam Location:  Inpatient Procedure: 2D Echo, Cardiac Doppler and Color Doppler Indications:    CAD of native vessel  History:        Patient has prior history of Echocardiogram examinations, most                 recent 02/26/2018. History of breast cancer,                 Signs/Symptoms:Chest Pain; Risk Factors:Hypertension and Current                 Smoker.  Sonographer:    Delcie Roch RDCS Referring Phys: [id number] CHRISTOPHER D MCALHANY IMPRESSIONS  1. Left ventricular ejection fraction, by estimation, is 50%. The left ventricle has mildly decreased function. The left ventricle demonstrates regional wall motion abnormalities with basal to mid inferoseptal and basal inferior akinesis. Left ventricular diastolic parameters are consistent with Grade I diastolic dysfunction (impaired relaxation).  2. Right ventricular systolic function is normal. The right ventricular size is normal. Tricuspid regurgitation signal is inadequate for assessing PA pressure.  3. The mitral valve is normal in structure. Trivial mitral  valve regurgitation. No evidence of mitral stenosis.  4. The aortic valve is tricuspid. Aortic valve regurgitation is not visualized. No aortic stenosis is present.  5. The inferior vena cava is normal in size with <50% respiratory variability, suggesting right atrial pressure of 8 mmHg. FINDINGS  Left Ventricle: Left ventricular ejection fraction, by estimation, is 50%. The left ventricle has mildly decreased function. The left ventricle demonstrates regional wall motion abnormalities. The left ventricular internal cavity size was normal in size. There is no left ventricular hypertrophy. Left ventricular diastolic parameters are consistent with Grade I diastolic dysfunction (impaired relaxation). Right Ventricle: The right ventricular size is normal. No increase in right ventricular wall thickness. Right ventricular systolic function is normal. Tricuspid regurgitation signal is inadequate for assessing PA pressure. Left Atrium: Left atrial size was normal in size. Right Atrium: Right atrial size was normal in size. Pericardium: Trivial pericardial effusion is present. Mitral Valve: The mitral valve is normal in structure. Trivial mitral valve regurgitation. No evidence of mitral valve stenosis. Tricuspid Valve: The tricuspid valve is normal in structure. Tricuspid valve regurgitation is not demonstrated. Aortic Valve: The aortic valve is tricuspid. Aortic valve regurgitation is not visualized. No aortic stenosis is present. Pulmonic Valve: The pulmonic valve was normal in structure. Pulmonic valve regurgitation is trivial. Aorta: The aortic root is normal in size and structure. Venous: The inferior vena cava is normal in size with less than 50% respiratory variability, suggesting right atrial pressure of 8 mmHg. IAS/Shunts: No atrial level shunt detected by color flow Doppler.  LEFT VENTRICLE PLAX 2D LVIDd:         4.00 cm     Diastology LV PW:  0.80 cm     LV e' medial:    8.08 cm/s LV IVS:        0.70 cm      LV E/e' medial:  9.5 LVOT diam:     1.70 cm     LV e' lateral:   9.17 cm/s LV SV:         50          LV E/e' lateral: 8.3 LV SV Index:   30 LVOT Area:     2.27 cm  LV Volumes (MOD) LV vol d, MOD A2C: 39.7 ml LV vol s, MOD A2C: 22.7 ml LV SV MOD A2C:     17.0 ml RIGHT VENTRICLE             IVC RV S prime:     16.20 cm/s  IVC diam: 1.60 cm TAPSE (M-mode): 2.1 cm LEFT ATRIUM             Index LA Vol (A2C):   23.8 ml 14.22 ml/m LA Vol (A4C):   33.2 ml 19.84 ml/m LA Biplane Vol: 29.7 ml 17.75 ml/m  AORTIC VALVE             PULMONIC VALVE LVOT Vmax:   113.00 cm/s PR End Diast Vel: 2.31 msec LVOT Vmean:  68.800 cm/s LVOT VTI:    0.221 m  AORTA Ao Root diam: 2.60 cm Ao Asc diam:  2.90 cm MITRAL VALVE MV Area (PHT): 3.37 cm    SHUNTS MV Decel Time: 225 msec    Systemic VTI:  0.22 m MV E velocity: 76.40 cm/s  Systemic Diam: 1.70 cm MV A velocity: 75.60 cm/s MV E/A ratio:  1.01 Dalton McleanMD Electronically signed by Wilfred Lacy Signature Date/Time: 10/24/2022/11:39:00 AM    Final    CARDIAC CATHETERIZATION  Result Date: 10/23/2022   Mid RCA lesion is 99% stenosed.   A drug-eluting stent was successfully placed using a SYNERGY XD 3.50X28.   Post intervention, there is a 0% residual stenosis.   There is mild left ventricular systolic dysfunction.   LV end diastolic pressure is severely elevated.   The left ventricular ejection fraction is 45-50% by visual estimate. Acute inferior ST elevation MI secondary to thrombotic occlusion of the mid RCA Successful PTCA/DES x 1 mid RCA No disease in the LAD or Circumflex LVEDP=58mmHg Recommendations: Will admit to the ICU. Aggrastat infusion for two hours. DAPT with ASA and Brilinta for one year. High intensity statin. Beta blocker as BP tolerates. Echo tomorrow. Lasix 40 mg IV given as patient was leaving the cath lab.   Disposition   Pt is being discharged home today in good condition.  Follow-up Plans & Appointments     Follow-up Information     Daira, Haun, NP Follow up on 11/07/2022.   Specialty: Cardiology Why: at 2:45pm for your follow up appt with cardiology Contact information: [address] [place] [address] [phone]                Discharge Instructions     Amb Referral to Cardiac Rehabilitation   Complete by: As directed    Diagnosis:  Coronary Stents STEMI     After initial evaluation and assessments completed: Virtual Based Care may be provided alone or in conjunction with Phase 2 Cardiac Rehab based on patient barriers.: Yes   Intensive Cardiac Rehabilitation (ICR) [facility] location only OR Traditional Cardiac Rehabilitation (TCR) *If criteria for ICR are not met will enroll in  TCR [facility] only): Yes   Call MD for:  difficulty breathing, headache or visual disturbances   Complete by: As directed    Call MD for:  persistant dizziness or light-headedness   Complete by: As directed    Call MD for:  redness, tenderness, or signs of infection (pain, swelling, redness, odor or green/yellow discharge around incision site)   Complete by: As directed    Diet - low sodium heart healthy   Complete by: As directed    Discharge instructions   Complete by: As directed    Radial Site Care Refer to this sheet in the next few weeks. These instructions provide you with information on caring for yourself after your procedure. Your caregiver may also give you more specific instructions. Your treatment has been planned according to current medical practices, but problems sometimes occur. Call your caregiver if you have any problems or questions after your procedure. HOME CARE INSTRUCTIONS You may shower the day after the procedure. Remove the bandage (dressing) and gently wash the site with plain soap and water. Gently pat the site dry.  Do not apply powder or lotion to the site.  Do not submerge the affected site in water for 3 to 5 days.  Inspect the site at least twice daily.  Do not flex or bend the affected arm  for 24 hours.  No lifting over 5 pounds (2.3 kg) for 5 days after your procedure.  Do not drive home if you are discharged the same day of the procedure. Have someone else drive you.  You may drive 24 hours after the procedure unless otherwise instructed by your caregiver.  What to expect: Any bruising will usually fade within 1 to 2 weeks.  Blood that collects in the tissue (hematoma) may be painful to the touch. It should usually decrease in size and tenderness within 1 to 2 weeks.  SEEK IMMEDIATE MEDICAL CARE IF: You have unusual pain at the radial site.  You have redness, warmth, swelling, or pain at the radial site.  You have drainage (other than a small amount of blood on the dressing).  You have chills.  You have a fever or persistent symptoms for more than 72 hours.  You have a fever and your symptoms suddenly get worse.  Your arm becomes pale, cool, tingly, or numb.  You have heavy bleeding from the site. Hold pressure on the site.   PLEASE DO NOT MISS ANY DOSES OF YOUR BRILINTA!!!!! Also keep a log of you blood pressures and bring back to your follow up appt. Please call the office with any questions.   Patients taking blood thinners should generally stay away from medicines like ibuprofen, Advil, Motrin, naproxen, and Aleve due to risk of stomach bleeding. You may take Tylenol as directed or talk to your primary doctor about alternatives.   PLEASE ENSURE THAT YOU DO NOT RUN OUT OF YOUR BRILINTA. This medication is very important to remain on for at least one year. IF you have issues obtaining this medication due to cost please CALL the office 3-5 business days prior to running out in order to prevent missing doses of this medication.   Increase activity slowly   Complete by: As directed         Discharge Medications   Allergies as of 10/25/2022       Reactions   Carbamazepine Anaphylaxis   Lamictal [lamotrigine] Anaphylaxis   SOB, Lamotrigine only   Omeprazole  Anaphylaxis   Other  STATES HAS SHORTNESS OF BREATH WITH GENERIC DRUGS     ALSO RASH AND ITCHING   Pantoprazole Anaphylaxis   Prednisone Other (See Comments), Rash, Shortness Of Breath   Prescribing MD told patient he thinks dose (dose pack) was "just too high.", 03/09/11   STATES HAS TAKEN SMALLER DOSE AND STILL HAD RASH AND JITTERINESS, Prescribing MD told patient he thinks dose (dose pack) was "just too high.", 03/09/11   STATES HAS TAKEN SMALLER DOSE AND STILL HAD RASH AND JITTERINESS   Proton Pump Inhibitors Anaphylaxis   Pseudoephedrine Hcl Er Other (See Comments)   SEIZURES.   Valium Other (See Comments)   Hallucinations, spasticity, hyper-relfexive   Amlodipine Rash   Face swell   Oxycodone Nausea And Vomiting, Rash, Other (See Comments)   Ibuprofen Swelling   Percocet [oxycodone-acetaminophen] Nausea And Vomiting   [employer] Er] Other (See Comments)   Made headache worse, dizziness, difficulty breathing, rash   Betadine [povidone Iodine] Rash   Latex Dermatitis, Rash   Shellfish Allergy Swelling        Medication List     STOP taking these medications    azithromycin 500 MG tablet Commonly known as: ZITHROMAX       TAKE these medications    aspirin EC 81 MG tablet Take 1 tablet (81 mg total) by mouth daily. Swallow whole.   atorvastatin 80 MG tablet Commonly known as: LIPITOR Take 1 tablet (80 mg total) by mouth daily. What changed:  medication strength how much to take   Brilinta 90 MG Tabs tablet Generic drug: ticagrelor Take 1 tablet (90 mg total) by mouth 2 (two) times daily.   EPINEPHrine 0.3 mg/0.3 mL Soaj injection Commonly known as: EPI-PEN Inject 0.3 mg into the muscle as needed for anaphylaxis.   gabapentin 100 MG capsule Commonly known as: Neurontin Take 1 capsule (100 mg total) by mouth at bedtime. What changed:  when to take this reasons to take this   levETIRAcetam 750 MG tablet Commonly known as: KEPPRA Take 1  tablet (750 mg total) by mouth 2 (two) times daily.   losartan 25 MG tablet Commonly known as: COZAAR Take 1 tablet (25 mg total) by mouth daily.   metoprolol succinate 50 MG 24 hr tablet Commonly known as: TOPROL-XL Take 1 tablet (50 mg total) by mouth daily. Take with or immediately following a meal. What changed:  medication strength See the new instructions.   nicotine 14 mg/24hr patch Commonly known as: NICODERM CQ - dosed in mg/24 hours Place 1 patch (14 mg total) onto the skin daily.   nitroGLYCERIN 0.4 MG SL tablet Commonly known as: Nitrostat Place 1 tablet (0.4 mg total) under the tongue every 5 (five) minutes as needed.   sodium chloride 0.65 % nasal spray Commonly known as: OCEAN Place 1-2 sprays into the nose as needed for congestion.   Stiolto Respimat 2.5-2.5 MCG/ACT Aers Generic drug: Tiotropium Bromide-Olodaterol Inhale 2 puffs into the lungs daily. What changed:  when to take this reasons to take this   sucralfate 1 g tablet Commonly known as: CARAFATE Take 1 g by mouth 2 (two) times daily.   Vitamin B-12 3000 MCG/ML Liqd Inject 3,000 mcg as directed every 30 (thirty) days.   ZyrTEC Allergy 10 MG tablet Generic drug: cetirizine Take 10 mg by mouth at bedtime as needed for allergies.        Outstanding Labs/Studies   FLP/LFTs in 8 weeks   Duration of Discharge Encounter   Greater than  30 minutes including physician time.  Signed, Laverda Page, NP 10/25/2022, 12:29 PM   Agree with note by Laverda Page NP-C  Patient mated with inferior STEMI.  She underwent PCI and drug-eluting stenting of mid dominant RCA stenosis using drug-eluting stent.  The remainder of her coronary anatomy was free of disease.  She did have mild LV dysfunction by 2D echo.  Her other problems include hypertension, hyperlipidemia tobacco abuse.  She did well post procedure.  She was having some SVT and palpitations related to caffeine.  She is on DAPT which should be  uninterrupted for at least 12 months as well as other appropriate medications for GDMT.  She stable for discharge she will follow-up with TOC 7 after which she will see Dr. Tresa Endo back in follow-up.  Runell Gess, M.D., FACP, [facility], [name] [address] [place], [place]  [address]  [phone] 10/25/2022 1:44 PM

## 2022-10-25 NOTE — Progress Notes (Addendum)
Pt in bed reporting she mistakenly consumed caffeine causing brief periods of ST (HR 150bpm) at rest. Pt asymptomatic. Nurse aware, reports she took a BB.   Pt is looking forward to CRP2 program.   Faustino Congress 10/25/2022 10:19 AM

## 2022-10-26 ENCOUNTER — Other Ambulatory Visit: Payer: Self-pay

## 2022-10-26 ENCOUNTER — Encounter (HOSPITAL_COMMUNITY)
Admission: EM | Disposition: A | Discharge status: Home / Self Care | Payer: Self-pay | Source: Home / Self Care | Attending: Internal Medicine

## 2022-10-26 ENCOUNTER — Encounter (HOSPITAL_COMMUNITY): Payer: Self-pay

## 2022-10-26 ENCOUNTER — Inpatient Hospital Stay (HOSPITAL_COMMUNITY)
Admission: EM | Admit: 2022-10-26 | Discharge: 2022-10-28 | DRG: 282 | Disposition: A | Discharge status: Home / Self Care | Payer: Medicare Other | Attending: Internal Medicine | Admitting: Internal Medicine

## 2022-10-26 ENCOUNTER — Emergency Department (HOSPITAL_COMMUNITY): Payer: Medicare Other

## 2022-10-26 DIAGNOSIS — R918 Other nonspecific abnormal finding of lung field: Secondary | ICD-10-CM | POA: Diagnosis not present

## 2022-10-26 DIAGNOSIS — G40909 Epilepsy, unspecified, not intractable, without status epilepticus: Secondary | ICD-10-CM | POA: Diagnosis present

## 2022-10-26 DIAGNOSIS — R1084 Generalized abdominal pain: Secondary | ICD-10-CM | POA: Diagnosis not present

## 2022-10-26 DIAGNOSIS — Z955 Presence of coronary angioplasty implant and graft: Secondary | ICD-10-CM

## 2022-10-26 DIAGNOSIS — Z91013 Allergy to seafood: Secondary | ICD-10-CM

## 2022-10-26 DIAGNOSIS — Z91040 Latex allergy status: Secondary | ICD-10-CM | POA: Diagnosis not present

## 2022-10-26 DIAGNOSIS — Z885 Allergy status to narcotic agent status: Secondary | ICD-10-CM

## 2022-10-26 DIAGNOSIS — I081 Rheumatic disorders of both mitral and tricuspid valves: Secondary | ICD-10-CM | POA: Diagnosis present

## 2022-10-26 DIAGNOSIS — K219 Gastro-esophageal reflux disease without esophagitis: Secondary | ICD-10-CM | POA: Diagnosis not present

## 2022-10-26 DIAGNOSIS — I25110 Atherosclerotic heart disease of native coronary artery with unstable angina pectoris: Secondary | ICD-10-CM | POA: Diagnosis not present

## 2022-10-26 DIAGNOSIS — I259 Chronic ischemic heart disease, unspecified: Secondary | ICD-10-CM | POA: Diagnosis not present

## 2022-10-26 DIAGNOSIS — I517 Cardiomegaly: Secondary | ICD-10-CM | POA: Diagnosis not present

## 2022-10-26 DIAGNOSIS — F17210 Nicotine dependence, cigarettes, uncomplicated: Secondary | ICD-10-CM | POA: Diagnosis not present

## 2022-10-26 DIAGNOSIS — I272 Pulmonary hypertension, unspecified: Secondary | ICD-10-CM | POA: Diagnosis present

## 2022-10-26 DIAGNOSIS — I213 ST elevation (STEMI) myocardial infarction of unspecified site: Secondary | ICD-10-CM | POA: Diagnosis not present

## 2022-10-26 DIAGNOSIS — R112 Nausea with vomiting, unspecified: Secondary | ICD-10-CM | POA: Diagnosis not present

## 2022-10-26 DIAGNOSIS — Z981 Arthrodesis status: Secondary | ICD-10-CM | POA: Diagnosis not present

## 2022-10-26 DIAGNOSIS — Z8261 Family history of arthritis: Secondary | ICD-10-CM | POA: Diagnosis not present

## 2022-10-26 DIAGNOSIS — E785 Hyperlipidemia, unspecified: Secondary | ICD-10-CM | POA: Diagnosis present

## 2022-10-26 DIAGNOSIS — K2100 Gastro-esophageal reflux disease with esophagitis, without bleeding: Secondary | ICD-10-CM | POA: Diagnosis not present

## 2022-10-26 DIAGNOSIS — Z8249 Family history of ischemic heart disease and other diseases of the circulatory system: Secondary | ICD-10-CM | POA: Diagnosis not present

## 2022-10-26 DIAGNOSIS — Z7902 Long term (current) use of antithrombotics/antiplatelets: Secondary | ICD-10-CM | POA: Diagnosis not present

## 2022-10-26 DIAGNOSIS — Z853 Personal history of malignant neoplasm of breast: Secondary | ICD-10-CM

## 2022-10-26 DIAGNOSIS — Z79899 Other long term (current) drug therapy: Secondary | ICD-10-CM

## 2022-10-26 DIAGNOSIS — I2119 ST elevation (STEMI) myocardial infarction involving other coronary artery of inferior wall: Secondary | ICD-10-CM | POA: Diagnosis not present

## 2022-10-26 DIAGNOSIS — I10 Essential (primary) hypertension: Secondary | ICD-10-CM | POA: Diagnosis present

## 2022-10-26 DIAGNOSIS — R079 Chest pain, unspecified: Secondary | ICD-10-CM

## 2022-10-26 DIAGNOSIS — I252 Old myocardial infarction: Secondary | ICD-10-CM | POA: Diagnosis not present

## 2022-10-26 DIAGNOSIS — Z888 Allergy status to other drugs, medicaments and biological substances status: Secondary | ICD-10-CM | POA: Diagnosis not present

## 2022-10-26 DIAGNOSIS — K828 Other specified diseases of gallbladder: Secondary | ICD-10-CM | POA: Diagnosis not present

## 2022-10-26 DIAGNOSIS — Z7951 Long term (current) use of inhaled steroids: Secondary | ICD-10-CM | POA: Diagnosis not present

## 2022-10-26 DIAGNOSIS — I200 Unstable angina: Secondary | ICD-10-CM

## 2022-10-26 DIAGNOSIS — Z7982 Long term (current) use of aspirin: Secondary | ICD-10-CM

## 2022-10-26 DIAGNOSIS — K7689 Other specified diseases of liver: Secondary | ICD-10-CM | POA: Diagnosis not present

## 2022-10-26 DIAGNOSIS — R0789 Other chest pain: Secondary | ICD-10-CM | POA: Diagnosis not present

## 2022-10-26 DIAGNOSIS — R110 Nausea: Secondary | ICD-10-CM | POA: Diagnosis not present

## 2022-10-26 DIAGNOSIS — I219 Acute myocardial infarction, unspecified: Secondary | ICD-10-CM

## 2022-10-26 DIAGNOSIS — K439 Ventral hernia without obstruction or gangrene: Secondary | ICD-10-CM | POA: Diagnosis not present

## 2022-10-26 HISTORY — PX: LEFT HEART CATH AND CORONARY ANGIOGRAPHY: CATH118249

## 2022-10-26 LAB — LIPID PANEL
Cholesterol: 197 mg/dL (ref 0–200)
HDL: 70 mg/dL (ref >40)
LDL Cholesterol: 113 mg/dL — ABNORMAL HIGH (ref 0–99)
Total CHOL/HDL Ratio: 2.8 RATIO
Triglycerides: 71 mg/dL (ref <150)
VLDL: 14 mg/dL (ref 0–40)

## 2022-10-26 LAB — BASIC METABOLIC PANEL
Anion gap: 16 — ABNORMAL HIGH (ref 5–15)
BUN: 22 mg/dL (ref 8–23)
CO2: 22 mmol/L (ref 22–32)
Calcium: 9.3 mg/dL (ref 8.9–10.3)
Chloride: 101 mmol/L (ref 98–111)
Creatinine, Ser: 1.04 mg/dL — ABNORMAL HIGH (ref 0.44–1.00)
GFR, Estimated: >60 mL/min (ref >60)
Glucose, Bld: 155 mg/dL — ABNORMAL HIGH (ref 70–99)
Potassium: 4.8 mmol/L (ref 3.5–5.1)
Sodium: 139 mmol/L (ref 135–145)

## 2022-10-26 LAB — CBC
HCT: 49.2 % — ABNORMAL HIGH (ref 36.0–46.0)
Hemoglobin: 16.8 g/dL — ABNORMAL HIGH (ref 12.0–15.0)
MCH: 30.4 pg (ref 26.0–34.0)
MCHC: 34.1 g/dL (ref 30.0–36.0)
MCV: 89.1 fL (ref 80.0–100.0)
Platelets: 174 10*3/uL (ref 150–400)
RBC: 5.52 MIL/uL — ABNORMAL HIGH (ref 3.87–5.11)
RDW: 12.6 % (ref 11.5–15.5)
WBC: 11.5 10*3/uL — ABNORMAL HIGH (ref 4.0–10.5)
nRBC: 0 % (ref 0.0–0.2)

## 2022-10-26 LAB — I-STAT CG4 LACTIC ACID, ED: Lactic Acid, Venous: 2.1 mmol/L (ref 0.5–1.9)

## 2022-10-26 LAB — TROPONIN I (HIGH SENSITIVITY)
Troponin I (High Sensitivity): 15363 ng/L (ref <18)
Troponin I (High Sensitivity): 10738 ng/L (ref <18)

## 2022-10-26 LAB — APTT: aPTT: 31 seconds (ref 24–36)

## 2022-10-26 LAB — PROTIME-INR
INR: 1 (ref 0.8–1.2)
Prothrombin Time: 13.1 seconds (ref 11.4–15.2)

## 2022-10-26 SURGERY — CORONARY/GRAFT ACUTE MI REVASCULARIZATION
Anesthesia: LOCAL

## 2022-10-26 SURGERY — LEFT HEART CATH AND CORONARY ANGIOGRAPHY
Anesthesia: LOCAL

## 2022-10-26 MED ORDER — SODIUM CHLORIDE 0.9 % IV SOLN: 250.0000 mL | INTRAVENOUS | Status: DC | PRN

## 2022-10-26 MED ORDER — ASPIRIN 81 MG PO CHEW
81.0000 mg | CHEWABLE_TABLET | Freq: Every day | ORAL | Status: DC
  Administered 2022-10-26 – 2022-10-28 (×3): 81 mg via ORAL
  Filled 2022-10-26 (×3): qty 1

## 2022-10-26 MED ORDER — FENTANYL CITRATE (PF) 100 MCG/2ML IJ SOLN
INTRAMUSCULAR | Status: AC
  Filled 2022-10-26: qty 2

## 2022-10-26 MED ORDER — LABETALOL HCL 5 MG/ML IV SOLN: 10.0000 mg | INTRAVENOUS | Status: AC | PRN

## 2022-10-26 MED ORDER — ACETAMINOPHEN 325 MG PO TABS: 650.0000 mg | ORAL_TABLET | ORAL | Status: DC | PRN

## 2022-10-26 MED ORDER — TICAGRELOR 90 MG PO TABS
90.0000 mg | ORAL_TABLET | Freq: Two times a day (BID) | ORAL | Status: DC
  Administered 2022-10-26 – 2022-10-28 (×5): 90 mg via ORAL
  Filled 2022-10-26 (×5): qty 1

## 2022-10-26 MED ORDER — HYDROXYZINE HCL 50 MG/ML IM SOLN
50.0000 mg | Freq: Four times a day (QID) | INTRAMUSCULAR | Status: DC | PRN
  Administered 2022-10-26 – 2022-10-27 (×2): 50 mg via INTRAMUSCULAR
  Filled 2022-10-26 (×4): qty 1

## 2022-10-26 MED ORDER — HEPARIN SODIUM (PORCINE) 5000 UNIT/ML IJ SOLN
60.0000 [IU]/kg | Freq: Once | INTRAMUSCULAR | Status: AC
  Administered 2022-10-26: 4000 [IU] via INTRAVENOUS
  Filled 2022-10-26: qty 1

## 2022-10-26 MED ORDER — FENTANYL CITRATE (PF) 100 MCG/2ML IJ SOLN
INTRAMUSCULAR | Status: DC | PRN
  Administered 2022-10-26: 25 ug via INTRAVENOUS

## 2022-10-26 MED ORDER — LIDOCAINE HCL (PF) 1 % IJ SOLN
INTRAMUSCULAR | Status: AC
  Filled 2022-10-26: qty 30

## 2022-10-26 MED ORDER — NITROGLYCERIN IN D5W 200-5 MCG/ML-% IV SOLN: 10.0000 ug/min | INTRAVENOUS | Status: DC

## 2022-10-26 MED ORDER — VERAPAMIL HCL 2.5 MG/ML IV SOLN
INTRAVENOUS | Status: AC
  Filled 2022-10-26: qty 2

## 2022-10-26 MED ORDER — ASPIRIN 81 MG PO CHEW: 81.0000 mg | CHEWABLE_TABLET | Freq: Every day | ORAL | Status: DC

## 2022-10-26 MED ORDER — LOSARTAN POTASSIUM 50 MG PO TABS
100.0000 mg | ORAL_TABLET | Freq: Every day | ORAL | Status: DC
  Administered 2022-10-26: 100 mg via ORAL
  Filled 2022-10-26: qty 2

## 2022-10-26 MED ORDER — FAMOTIDINE 20 MG PO TABS
20.0000 mg | ORAL_TABLET | Freq: Every day | ORAL | Status: DC
  Filled 2022-10-26 (×2): qty 1

## 2022-10-26 MED ORDER — ONDANSETRON HCL 4 MG/2ML IJ SOLN
4.0000 mg | Freq: Four times a day (QID) | INTRAMUSCULAR | Status: DC | PRN
  Administered 2022-10-26 – 2022-10-28 (×7): 4 mg via INTRAVENOUS
  Filled 2022-10-26 (×7): qty 2

## 2022-10-26 MED ORDER — HEPARIN (PORCINE) IN NACL 1000-0.9 UT/500ML-% IV SOLN
INTRAVENOUS | Status: DC | PRN
  Administered 2022-10-26 (×2): 500 mL

## 2022-10-26 MED ORDER — SODIUM CHLORIDE 0.9% FLUSH: 3.0000 mL | INTRAVENOUS | Status: DC | PRN

## 2022-10-26 MED ORDER — HEPARIN SODIUM (PORCINE) 1000 UNIT/ML IJ SOLN
INTRAMUSCULAR | Status: AC
  Filled 2022-10-26: qty 10

## 2022-10-26 MED ORDER — MIDAZOLAM HCL 2 MG/2ML IJ SOLN
INTRAMUSCULAR | Status: AC
  Filled 2022-10-26: qty 2

## 2022-10-26 MED ORDER — TICAGRELOR 90 MG PO TABS: 90.0000 mg | ORAL_TABLET | Freq: Two times a day (BID) | ORAL | Status: DC

## 2022-10-26 MED ORDER — ONDANSETRON HCL 4 MG/2ML IJ SOLN
4.0000 mg | Freq: Once | INTRAMUSCULAR | Status: AC
  Administered 2022-10-26: 4 mg via INTRAVENOUS
  Filled 2022-10-26: qty 2

## 2022-10-26 MED ORDER — SODIUM CHLORIDE 0.9% FLUSH
3.0000 mL | Freq: Two times a day (BID) | INTRAVENOUS | Status: DC
  Administered 2022-10-26 – 2022-10-28 (×4): 3 mL via INTRAVENOUS

## 2022-10-26 MED ORDER — VERAPAMIL HCL 2.5 MG/ML IV SOLN
INTRAVENOUS | Status: DC | PRN
  Administered 2022-10-26: 10 mL via INTRA_ARTERIAL

## 2022-10-26 MED ORDER — LEVETIRACETAM 750 MG PO TABS
750.0000 mg | ORAL_TABLET | Freq: Two times a day (BID) | ORAL | Status: DC
  Administered 2022-10-26 – 2022-10-28 (×5): 750 mg via ORAL
  Filled 2022-10-26 (×7): qty 1

## 2022-10-26 MED ORDER — LIDOCAINE HCL (PF) 1 % IJ SOLN
INTRAMUSCULAR | Status: DC | PRN
  Administered 2022-10-26: 2 mL via INTRADERMAL

## 2022-10-26 MED ORDER — NITROGLYCERIN 1 MG/10 ML FOR IR/CATH LAB
INTRA_ARTERIAL | Status: AC
  Filled 2022-10-26: qty 10

## 2022-10-26 MED ORDER — ASPIRIN 81 MG PO CHEW
324.0000 mg | CHEWABLE_TABLET | Freq: Once | ORAL | Status: DC
  Filled 2022-10-26: qty 4

## 2022-10-26 MED ORDER — METOPROLOL SUCCINATE ER 50 MG PO TB24: 50.0000 mg | ORAL_TABLET | Freq: Every day | ORAL | Status: DC

## 2022-10-26 MED ORDER — SUCRALFATE 1 G PO TABS
1.0000 g | ORAL_TABLET | Freq: Three times a day (TID) | ORAL | Status: DC
  Administered 2022-10-26 – 2022-10-28 (×8): 1 g via ORAL
  Filled 2022-10-26 (×8): qty 1

## 2022-10-26 MED ORDER — ATORVASTATIN CALCIUM 80 MG PO TABS
80.0000 mg | ORAL_TABLET | Freq: Every day | ORAL | Status: DC
  Administered 2022-10-26 – 2022-10-28 (×3): 80 mg via ORAL
  Filled 2022-10-26 (×3): qty 1

## 2022-10-26 MED ORDER — MIDAZOLAM HCL 2 MG/2ML IJ SOLN
INTRAMUSCULAR | Status: DC | PRN
  Administered 2022-10-26: 1 mg via INTRAVENOUS

## 2022-10-26 MED ORDER — METOPROLOL SUCCINATE ER 100 MG PO TB24
100.0000 mg | ORAL_TABLET | Freq: Every day | ORAL | Status: DC
  Administered 2022-10-27 – 2022-10-28 (×2): 100 mg via ORAL
  Filled 2022-10-26 (×2): qty 1

## 2022-10-26 MED ORDER — SODIUM CHLORIDE 0.9 % IV SOLN: INTRAVENOUS | Status: DC

## 2022-10-26 MED ORDER — NITROGLYCERIN IN D5W 200-5 MCG/ML-% IV SOLN
0.0000 ug/min | INTRAVENOUS | Status: DC
  Administered 2022-10-26: 35 ug/min via INTRAVENOUS
  Administered 2022-10-26: 30 ug/min via INTRAVENOUS
  Administered 2022-10-26 (×2): 10 ug/min via INTRAVENOUS
  Administered 2022-10-26: 30 ug/min via INTRAVENOUS
  Administered 2022-10-26: 15 ug/min via INTRAVENOUS
  Administered 2022-10-26: 25 ug/min via INTRAVENOUS
  Administered 2022-10-26: 20 ug/min via INTRAVENOUS
  Administered 2022-10-26: 5 ug/min via INTRAVENOUS
  Administered 2022-10-26: 25 ug/min via INTRAVENOUS
  Administered 2022-10-26: 20 ug/min via INTRAVENOUS
  Administered 2022-10-26: 5 ug/min via INTRAVENOUS
  Administered 2022-10-26: 15 ug/min via INTRAVENOUS
  Filled 2022-10-26: qty 250

## 2022-10-26 MED ORDER — METOPROLOL SUCCINATE ER 25 MG PO TB24
25.0000 mg | ORAL_TABLET | Freq: Every day | ORAL | Status: DC
  Administered 2022-10-26: 25 mg via ORAL
  Filled 2022-10-26: qty 1

## 2022-10-26 MED ORDER — LOSARTAN POTASSIUM 50 MG PO TABS
50.0000 mg | ORAL_TABLET | Freq: Every day | ORAL | Status: DC
  Administered 2022-10-27: 50 mg via ORAL
  Filled 2022-10-26: qty 1

## 2022-10-26 MED ORDER — HYDRALAZINE HCL 20 MG/ML IJ SOLN: 10.0000 mg | INTRAMUSCULAR | Status: AC | PRN

## 2022-10-26 MED FILL — Heparin Sodium (Porcine) Inj 1000 Unit/ML: INTRAMUSCULAR | Qty: 10 | Status: AC

## 2022-10-26 SURGICAL SUPPLY — 12 items
CATH DIAG 6FR PIGTAIL ANGLED (CATHETERS) IMPLANT
CATH INFINITI 6F FL3.5 (CATHETERS) IMPLANT
CATH VISTA GUIDE 6FR JR4 (CATHETERS) IMPLANT
DEVICE RAD COMP TR BAND LRG (VASCULAR PRODUCTS) IMPLANT
GLIDESHEATH SLEND SS 6F .021 (SHEATH) IMPLANT
KIT ENCORE 26 ADVANTAGE (KITS) IMPLANT
KIT HEART LEFT (KITS) ×2 IMPLANT
PACK CARDIAC CATHETERIZATION (CUSTOM PROCEDURE TRAY) ×2 IMPLANT
SHEATH PROBE COVER 6X72 (BAG) IMPLANT
TRANSDUCER W/STOPCOCK (MISCELLANEOUS) ×2 IMPLANT
TUBING CIL FLEX 10 FLL-RA (TUBING) ×2 IMPLANT
WIRE EMERALD 3MM-J .035X260CM (WIRE) IMPLANT

## 2022-10-26 NOTE — H&P (Signed)
Cardiology Admission History and Physical   Patient ID: Wendy Lopez MRN: [id number]; DOB: [birth year]   Admission date: 10/26/2022  PCP:  Georgann Housekeeper, MD   [facility] Providers Cardiologist:  Nicki Guadalajara, MD        Chief Complaint:  Chest Pain   Patient Profile:   Wendy Lopez is a 62 y.o. female with a history of ongoing tobacco abuse, hypertension, and a recent inferior ST elevation myocardial infarction (10/23/22) s/p primary PCI with mid right coronary artery stent who returns with recurrent chest pain and associated nausea and ST segment elevation on EKG similar to her EKG post PCI.     History of Present Illness:   Wendy Lopez discharged from the hospital around 1 PM on 10/25/2022.  Within a few hours she developed gnawing, intermittent chest pain about 4 out of 10 in severity with nausea and vomiting.  Her chest pain was nowhere near the pain that she experienced with her recent STEMI and while the pain is intermittent, and stable at a 4 in severity.  However her nausea and associated vomiting continued to get worse prompting her to come into the emergency room for evaluation.  On presentation her EKG showed persistent ST elevation in the inferior leads similar to her prior ECGs. Due to ongoing chest pain of uncertain etiology, the patient was referred for emergent coronary angiography.    Past Medical History:  Diagnosis Date   Anxiety and depression    Arthritis    Breast cancer (HCC) 02/13/2009   Cancer of right breast (HCC) 04/16/2012   Depression    Depression 2014   Dysrhythmia    palpitations   Epilepsy (HCC)    last seizure 1990's   H/O hiatal hernia    Localization-related (focal) (partial) epilepsy and epileptic syndromes with complex partial seizures, without mention of intractable epilepsy 02/07/2013   Memory deficit 02/07/2013   Memory loss    Mitral valve prolapse 06/01/10   echo- EF>55% mild tricupsid regurgitation. There is  mild Pulmonary Hypertension.. Right  ventricular systolic pressure iselevated at 30-40 mmHg. LV systolic function is normal.   Osteopenia    PONV (postoperative nausea and vomiting)    SOB (shortness of breath) 10/18/11   met-test   Spinal fusion failure    3    Past Surgical History:  Procedure Laterality Date   BACK SURGERY     screws- lumbar   BREAST SURGERY  2010   mastectomy - RIGHT   BRONCHIAL WASHINGS  04/01/2021   Procedure: BRONCHIAL WASHINGS;  Surgeon: Josephine Igo, DO;  Location: [facility];  Service: Cardiopulmonary;;   COLONOSCOPY WITH PROPOFOL  03/13/2012   Procedure: COLONOSCOPY WITH PROPOFOL;  Surgeon: Charolett Bumpers, MD;  Location: [facility];  Service: Endoscopy;  Laterality: N/A;   CORONARY/GRAFT ACUTE MI REVASCULARIZATION N/A 10/23/2022   Procedure: Coronary/Graft Acute MI Revascularization;  Surgeon: Kathleene Hazel, MD;  Location: [facility];  Service: Cardiovascular;  Laterality: N/A;   ESOPHAGOGASTRODUODENOSCOPY  03/13/2012   Procedure: ESOPHAGOGASTRODUODENOSCOPY (EGD);  Surgeon: Charolett Bumpers, MD;  Location: [facility];  Service: Endoscopy;  Laterality: N/A;  Reflux, Hiatal Hernia   HAND TENDON SURGERY  2012   tendons released in right hand    LEFT HEART CATH AND CORONARY ANGIOGRAPHY N/A 10/23/2022   Procedure: LEFT HEART CATH AND CORONARY ANGIOGRAPHY;  Surgeon: Kathleene Hazel, MD;  Location: [facility];  Service: Cardiovascular;  Laterality: N/A;   MASTECTOMY, RADICAL  02/13/2009   SPINE SURGERY  1999, 2003, 2005   TONSILLECTOMY     VIDEO BRONCHOSCOPY Bilateral 04/01/2021   Procedure: VIDEO BRONCHOSCOPY WITHOUT FLUORO;  Surgeon: Josephine Igo, DO;  Location: [facility];  Service: Cardiopulmonary;  Laterality: Bilateral;  Ultrathin bronchoscopy     Medications Prior to Admission: Prior to Admission medications   Medication Sig Start Date End Date Taking? Authorizing Provider  aspirin EC 81 MG tablet Take 1  tablet (81 mg total) by mouth daily. Swallow whole. 10/25/22 10/25/23  Arty Baumgartner, NP  atorvastatin (LIPITOR) 80 MG tablet Take 1 tablet (80 mg total) by mouth daily. 10/25/22   Arty Baumgartner, NP  Cyanocobalamin (VITAMIN B-12) 3000 MCG/ML LIQD Inject 3,000 mcg as directed every 30 (thirty) days.    [provider]  EPINEPHrine 0.3 mg/0.3 mL IJ SOAJ injection Inject 0.3 mg into the muscle as needed for anaphylaxis.    [provider]  gabapentin (NEURONTIN) 100 MG capsule Take 1 capsule (100 mg total) by mouth at bedtime. Patient taking differently: Take 100 mg by mouth as needed. 07/11/22   Windell Norfolk, MD  levETIRAcetam (KEPPRA) 750 MG tablet Take 1 tablet (750 mg total) by mouth 2 (two) times daily. 02/25/22 02/20/23  Windell Norfolk, MD  losartan (COZAAR) 25 MG tablet Take 1 tablet (25 mg total) by mouth daily. 06/17/22 06/12/23  Lennette Bihari, MD  metoprolol succinate (TOPROL-XL) 50 MG 24 hr tablet Take 1 tablet (50 mg total) by mouth daily. Take with or immediately following a meal. 10/25/22   Laverda Page B, NP  nicotine (NICODERM CQ - DOSED IN MG/24 HOURS) 14 mg/24hr patch Place 1 patch (14 mg total) onto the skin daily. 10/25/22 10/25/23  Arty Baumgartner, NP  nitroGLYCERIN (NITROSTAT) 0.4 MG SL tablet Place 1 tablet (0.4 mg total) under the tongue every 5 (five) minutes as needed. 10/25/22   Arty Baumgartner, NP  sodium chloride (OCEAN) 0.65 % nasal spray Place 1-2 sprays into the nose as needed for congestion.    [provider]  sucralfate (CARAFATE) 1 g tablet Take 1 g by mouth 2 (two) times daily. Patient not taking: Reported on 10/24/2022 09/19/22   [provider]  ticagrelor (BRILINTA) 90 MG TABS tablet Take 1 tablet (90 mg total) by mouth 2 (two) times daily. 10/25/22   Arty Baumgartner, NP  Tiotropium Bromide-Olodaterol (STIOLTO RESPIMAT) 2.5-2.5 MCG/ACT AERS Inhale 2 puffs into the lungs daily. Patient taking differently: Inhale 2 puffs into  the lungs daily as needed (COPD, bronchitis). 06/14/21   Icard, Rachel Bo, DO  ZYRTEC ALLERGY 10 MG tablet Take 10 mg by mouth at bedtime as needed for allergies.    [provider]     Allergies:    Allergies  Allergen Reactions   Carbamazepine Anaphylaxis   Lamictal [Lamotrigine] Anaphylaxis    SOB, Lamotrigine only   Omeprazole Anaphylaxis   Other     STATES HAS SHORTNESS OF BREATH WITH GENERIC DRUGS     ALSO RASH AND ITCHING   Pantoprazole Anaphylaxis   Prednisone Other (See Comments), Rash and Shortness Of Breath    Prescribing MD told patient he thinks dose (dose pack) was "just too high.", 03/09/11   STATES HAS TAKEN SMALLER DOSE AND STILL HAD RASH AND JITTERINESS, Prescribing MD told patient he thinks dose (dose pack) was "just too high.", 03/09/11   STATES HAS TAKEN SMALLER DOSE AND STILL HAD RASH AND JITTERINESS   Proton Pump Inhibitors Anaphylaxis  Pseudoephedrine Hcl Er Other (See Comments)    SEIZURES.   Valium Other (See Comments)    Hallucinations, spasticity, hyper-relfexive    Amlodipine Rash    Face swell   Oxycodone Nausea And Vomiting, Rash and Other (See Comments)   Ibuprofen Swelling   Percocet [Oxycodone-Acetaminophen] Nausea And Vomiting   Trokendi [employer] Er] Other (See Comments)    Made headache worse, dizziness, difficulty breathing, rash   Betadine [Povidone Iodine] Rash   Latex Dermatitis and Rash   Shellfish Allergy Swelling    Social History:   Social History   Socioeconomic History   Marital status: Married    Spouse name: Not on file   Number of children: 1   Years of education: college 2   Highest education level: Not on file  Occupational History   Occupation: unemployed    Employer: UNEMPLOYED    Comment: 2003  Tobacco Use   Smoking status: Every Day    Packs/day: 1.00    Years: 43.00    Additional pack years: 0.00    Total pack years: 43.00    Types: Cigarettes   Smokeless tobacco: Never   Tobacco comments:     1/2 pk per day  Substance and Sexual Activity   Alcohol use: No    Alcohol/week: 0.0 standard drinks of alcohol   Drug use: No   Sexual activity: Not on file  Other Topics Concern   Not on file  Social History Narrative   Patient lives at home with husband   Patient is right handed.   Patient does not drink caffeine.   Social Determinants of Health   Financial Resource Strain: Not on file  Food Insecurity: No Food Insecurity (10/23/2022)   Hunger Vital Sign    Worried About Running Out of Food in the Last Year: Never true    Ran Out of Food in the Last Year: Never true  Transportation Needs: No Transportation Needs (10/23/2022)   PRAPARE - Administrator, Civil Service (Medical): No    Lack of Transportation (Non-Medical): No  Physical Activity: Not on file  Stress: Not on file  Social Connections: Not on file  Intimate Partner Violence: Not At Risk (10/23/2022)   Humiliation, Afraid, Rape, and Kick questionnaire    Fear of Current or Ex-Partner: No    Emotionally Abused: No    Physically Abused: No    Sexually Abused: No    Family History:   The patient's family history includes Arthritis in her mother; Heart disease in her father, mother, and sister.    ROS:  Please see the history of present illness.  All other ROS reviewed and negative.     Physical Exam/Data:   Vitals:   10/26/22 0201 10/26/22 0253 10/26/22 0342 10/26/22 0504  BP: (!) 152/91  (!) 126/90 (!) 150/88  Pulse: 86  83 80  Resp: 17   16  Temp: 98.8 F (37.1 C)  98.2 F (36.8 C) 98.1 F (36.7 C)  TempSrc:   Oral Oral  SpO2: 98% (!) 2%  92%    Intake/Output Summary (Last 24 hours) at 10/26/2022 0523 Last data filed at 10/26/2022 0517 Gross per 24 hour  Intake 8.81 ml  Output --  Net 8.81 ml      10/23/2022   12:22 PM 10/07/2022    1:03 PM 07/11/2022    3:30 PM  Last 3 Weights  Weight (lbs) 146 lb 3.7 oz 141 lb 139 lb 3.2 oz  Weight (  kg) 66.33 kg 63.957 kg 63.141 kg     There is no  height or weight on file to calculate BMI.  General: Well developed, well nourished, female appearing in no acute distress. Head: Normocephalic, atraumatic.      Neck: Supple without bruits, JVD. Lungs:  Resp regular and unlabored, CTA. Heart: RRR, S1, S2, no S3, S4, or murmur; no rub. Abdomen: Soft, non-tender, non-distended with normoactive bowel sounds. No hepatomegaly. No rebound/guarding. No obvious abdominal masses. Extremities: No clubbing, cyanosis, edema. Distal pedal pulses are 2+ bilaterally. Right cath site stable with mild bruising, no hematoma Neuro: Alert and oriented X 3. Moves all extremities spontaneously. Psych: Normal affect.    Relevant CV Studies:  Cath: 10/26/22 Diagnostic Dominance: Right Left Anterior Descending  Vessel is large.    Left Circumflex  Vessel is moderate in size.    Right Coronary Artery  Vessel is large.  Non-stenotic Mid RCA lesion was previously treated. The lesion is thrombotic.    Intervention   No interventions have been documented.    Cath: 10/23/2022     Mid RCA lesion is 99% stenosed.   A drug-eluting stent was successfully placed using a SYNERGY XD [id number].   Post intervention, there is a 0% residual stenosis.   There is mild left ventricular systolic dysfunction.   LV end diastolic pressure is severely elevated.   The left ventricular ejection fraction is 45-50% by visual estimate.   Acute inferior ST elevation MI secondary to thrombotic occlusion of the mid RCA Successful PTCA/DES x 1 mid RCA No disease in the LAD or Circumflex LVEDP=31mmHg   Laboratory Data:  High Sensitivity Troponin:   Recent Labs  Lab 10/23/22 1209 10/23/22 1420 10/26/22 0217  TROPONINIHS 68* >24,000* 15,363*      Chemistry Recent Labs  Lab 10/23/22 1638 10/24/22 0337 10/25/22 0157 10/26/22 0217  NA  --    < > 137 139  K  --    < > 3.6 4.8  CL  --    < > 101 101  CO2  --    < > 27 22  GLUCOSE  --    < > 98 155*  BUN  --    < > 13  22  CREATININE  --    < > 0.90 1.04*  CALCIUM  --    < > 8.7* 9.3  MG 1.9  --  2.0  --   GFRNONAA  --    < > >60 >60  ANIONGAP  --    < > 9 16*   < > = values in this interval not displayed.    Recent Labs  Lab 10/23/22 1209  PROT 5.8*  ALBUMIN 3.3*  AST 43*  ALT 30  ALKPHOS 67  BILITOT 1.4*   Lipids  Recent Labs  Lab 10/26/22 0217  CHOL 197  TRIG 71  HDL 70  LDLCALC 113*  CHOLHDL 2.8   Hematology Recent Labs  Lab 10/24/22 0802 10/26/22 0217  WBC 11.1* 11.5*  RBC 5.21* 5.52*  HGB 15.7* 16.8*  HCT 45.2 49.2*  MCV 86.8 89.1  MCH 30.1 30.4  MCHC 34.7 34.1  RDW 12.7 12.6  PLT 159 174   Thyroid No results for input(s): "TSH", "FREET4" in the last 168 hours. BNPNo results for input(s): "BNP", "PROBNP" in the last 168 hours.  DDimer No results for input(s): "DDIMER" in the last 168 hours.   Radiology/Studies:  CARDIAC CATHETERIZATION  Result Date: 10/26/2022   Non-stenotic Mid  RCA lesion was previously treated. 1.  Widely patent right coronary artery stent. 2.  LVEDP of 14 mmHg. Recommendation: Medical therapy.   DG Chest Port 1 View  Result Date: 10/26/2022 CLINICAL DATA:  Chest pain, suspected STEMI, nausea. EXAM: PORTABLE CHEST 1 VIEW COMPARISON:  Partial chest CT for coronary artery calcium scoring 06/14/2022, PA and lateral chest 03/23/2011. FINDINGS: There is mild cardiomegaly without evidence of CHF. There is calcification in the aortic arch, with a normal mediastinal configuration. There is scattered linear scarring or atelectasis in the lung bases. No substantial pleural effusion. The lungs are otherwise clear. Osteopenia. Degenerative change thoracic spine. Multiple overlying monitor wires. IMPRESSION: 1. Mild cardiomegaly without evidence of CHF. 2. Aortic atherosclerosis. 3. Linear scarring or atelectasis in the lung bases. Electronically Signed   By: Almira Bar M.D.   On: 10/26/2022 02:31     Assessment and Plan:   FAITHANNE VERRET is a 62 y.o.  female with a history of ongoing tobacco abuse, hypertension, and a recent inferior ST elevation myocardial infarction (10/23/22) s/p primary PCI with mid right coronary artery stent who returns with recurrent chest pain and associated nausea and ST segment elevation on EKG similar to her EKG post PCI.   Repeat LHC with no new coronary lesions and improved LV filling filling pressures, thus no interventions were performed.    Recent History of Inferior STEMI -- continue ASA/Brilinta, statin, Toprol XL, and ACEi   Mild LV dysfunction -- continue Toprol XL    HLD -- LDL 124, HDL 64 -- continue atorvastatin 80mg  daily    HTN - continue regimen as above     Tobacco use -- Continue to encourage cessation    Risk Assessment/Risk Scores:    Code Status: Full Code  Severity of Illness: The appropriate patient status for this patient is OBSERVATION. Observation status is judged to be reasonable and necessary in order to provide the required intensity of service to ensure the patient's safety. The patient's presenting symptoms, physical exam findings, and initial radiographic and laboratory data in the context of their medical condition is felt to place them at decreased risk for further clinical deterioration. Furthermore, it is anticipated that the patient will be medically stable for discharge from the hospital within 2 midnights of admission.    For questions or updates, please contact [facility] Please consult [email or web address] for contact info under     Signed, Ralene Ok, MD  10/26/2022 5:23 AM

## 2022-10-26 NOTE — Progress Notes (Signed)
Rounding Note    Patient Name: Wendy Lopez Date of Encounter: 10/26/2022  [facility] Cardiologist: Nicki Guadalajara, MD   Subjective   Patient readmitted last night with nausea and chest pain after being discharged home yesterday afternoon.  Relook cath revealed widely patent RCA stent.  Her major issue now is ongoing nausea.  Inpatient Medications    Scheduled Meds:  aspirin  81 mg Oral Daily   atorvastatin  80 mg Oral Daily   levETIRAcetam  750 mg Oral BID   losartan  100 mg Oral Daily   [START ON 10/27/2022] metoprolol succinate  50 mg Oral Daily   nitroGLYCERIN       sodium chloride flush  3 mL Intravenous Q12H   ticagrelor  90 mg Oral BID   Continuous Infusions:  sodium chloride 20 mL/hr at 10/26/22 0222   sodium chloride     nitroGLYCERIN 5 mcg/min (10/26/22 0237)   PRN Meds: sodium chloride, acetaminophen, hydrOXYzine, nitroGLYCERIN, ondansetron (ZOFRAN) IV, sodium chloride flush   Vital Signs    Vitals:   10/26/22 0540 10/26/22 0606 10/26/22 0738 10/26/22 0920  BP: (!) 149/92 (!) 148/88 (!) 161/92 (!) 158/90  Pulse: 72 81 81 85  Resp: 17 19 17 20   Temp:  97.7 F (36.5 C) 97.8 F (36.6 C)   TempSrc:  Oral Oral   SpO2:  93% 93% 93%    Intake/Output Summary (Last 24 hours) at 10/26/2022 1010 Last data filed at 10/26/2022 0517 Gross per 24 hour  Intake 8.81 ml  Output --  Net 8.81 ml      10/23/2022   12:22 PM 10/07/2022    1:03 PM 07/11/2022    3:30 PM  Last 3 Weights  Weight (lbs) 146 lb 3.7 oz 141 lb 139 lb 3.2 oz  Weight (kg) 66.33 kg 63.957 kg 63.141 kg      Telemetry    Sinus rhythm with PVCs- Personally Reviewed  ECG    Sinus rhythm at 63 with small inferior Q waves, inferolateral T wave inversion in early R wave transition.- Personally Reviewed  Physical Exam   GEN: No acute distress.   Neck: No JVD Cardiac: RRR, no murmurs, rubs, or gallops.  Respiratory: Clear to auscultation bilaterally. GI: Soft, nontender,  non-distended  MS: No edema; No deformity. Neuro:  Nonfocal  Psych: Normal affect   Labs    High Sensitivity Troponin:   Recent Labs  Lab 10/23/22 1209 10/23/22 1420 10/26/22 0217 10/26/22 0637  TROPONINIHS 68* >24,000* 15,363* 10,738*     Chemistry Recent Labs  Lab 10/23/22 1209 10/23/22 1252 10/23/22 1638 10/24/22 0337 10/25/22 0157 10/26/22 0217  NA 142   < >  --  136 137 139  K 3.9   < >  --  4.8 3.6 4.8  CL 105   < >  --  104 101 101  CO2 22  --   --  22 27 22   GLUCOSE 149*   < >  --  125* 98 155*  BUN 12   < >  --  14 13 22   CREATININE 0.95   < >  --  0.83 0.90 1.04*  CALCIUM 8.5*  --   --  8.9 8.7* 9.3  MG  --   --  1.9  --  2.0  --   PROT 5.8*  --   --   --   --   --   ALBUMIN 3.3*  --   --   --   --   --  AST 43*  --   --   --   --   --   ALT 30  --   --   --   --   --   ALKPHOS 67  --   --   --   --   --   BILITOT 1.4*  --   --   --   --   --   GFRNONAA >60  --   --  >60 >60 >60  ANIONGAP 15  --   --  10 9 16*   < > = values in this interval not displayed.    Lipids  Recent Labs  Lab 10/26/22 0217  CHOL 197  TRIG 71  HDL 70  LDLCALC 113*  CHOLHDL 2.8    Hematology Recent Labs  Lab 10/23/22 1209 10/23/22 1252 10/24/22 0802 10/26/22 0217  WBC 13.3*  --  11.1* 11.5*  RBC 5.07  --  5.21* 5.52*  HGB 15.2* 14.6 15.7* 16.8*  HCT 44.5 43.0 45.2 49.2*  MCV 87.8  --  86.8 89.1  MCH 30.0  --  30.1 30.4  MCHC 34.2  --  34.7 34.1  RDW 12.7  --  12.7 12.6  PLT 164  --  159 174   Thyroid No results for input(s): "TSH", "FREET4" in the last 168 hours.  BNPNo results for input(s): "BNP", "PROBNP" in the last 168 hours.  DDimer No results for input(s): "DDIMER" in the last 168 hours.   Radiology    CARDIAC CATHETERIZATION  Result Date: 10/26/2022   Non-stenotic Mid RCA lesion was previously treated. 1.  Widely patent right coronary artery stent. 2.  LVEDP of 14 mmHg. Recommendation: Medical therapy.   DG Chest Port 1 View  Result Date:  10/26/2022 CLINICAL DATA:  Chest pain, suspected STEMI, nausea. EXAM: PORTABLE CHEST 1 VIEW COMPARISON:  Partial chest CT for coronary artery calcium scoring 06/14/2022, PA and lateral chest 03/23/2011. FINDINGS: There is mild cardiomegaly without evidence of CHF. There is calcification in the aortic arch, with a normal mediastinal configuration. There is scattered linear scarring or atelectasis in the lung bases. No substantial pleural effusion. The lungs are otherwise clear. Osteopenia. Degenerative change thoracic spine. Multiple overlying monitor wires. IMPRESSION: 1. Mild cardiomegaly without evidence of CHF. 2. Aortic atherosclerosis. 3. Linear scarring or atelectasis in the lung bases. Electronically Signed   By: Almira Bar M.D.   On: 10/26/2022 02:31   ECHOCARDIOGRAM COMPLETE  Result Date: 10/24/2022    ECHOCARDIOGRAM REPORT   Patient Name:   Wendy Lopez Date of Exam: 10/24/2022 Medical Rec #:  [id number]         Height:       62.0 in Accession #:    [id number]        Weight:       146.2 lb Date of Birth:  04-05-1961          BSA:          1.673 m Patient Age:    61 years          BP:           119/78 mmHg Patient Gender: F                 HR:           66 bpm. Exam Location:  Inpatient Procedure: 2D Echo, Cardiac Doppler and Color Doppler Indications:    CAD of native vessel  History:  Patient has prior history of Echocardiogram examinations, most                 recent 02/26/2018. History of breast cancer,                 Signs/Symptoms:Chest Pain; Risk Factors:Hypertension and Current                 Smoker.  Sonographer:    Delcie Roch RDCS Referring Phys: [id number] CHRISTOPHER D MCALHANY IMPRESSIONS  1. Left ventricular ejection fraction, by estimation, is 50%. The left ventricle has mildly decreased function. The left ventricle demonstrates regional wall motion abnormalities with basal to mid inferoseptal and basal inferior akinesis. Left ventricular diastolic parameters are consistent  with Grade I diastolic dysfunction (impaired relaxation).  2. Right ventricular systolic function is normal. The right ventricular size is normal. Tricuspid regurgitation signal is inadequate for assessing PA pressure.  3. The mitral valve is normal in structure. Trivial mitral valve regurgitation. No evidence of mitral stenosis.  4. The aortic valve is tricuspid. Aortic valve regurgitation is not visualized. No aortic stenosis is present.  5. The inferior vena cava is normal in size with <50% respiratory variability, suggesting right atrial pressure of 8 mmHg. FINDINGS  Left Ventricle: Left ventricular ejection fraction, by estimation, is 50%. The left ventricle has mildly decreased function. The left ventricle demonstrates regional wall motion abnormalities. The left ventricular internal cavity size was normal in size. There is no left ventricular hypertrophy. Left ventricular diastolic parameters are consistent with Grade I diastolic dysfunction (impaired relaxation). Right Ventricle: The right ventricular size is normal. No increase in right ventricular wall thickness. Right ventricular systolic function is normal. Tricuspid regurgitation signal is inadequate for assessing PA pressure. Left Atrium: Left atrial size was normal in size. Right Atrium: Right atrial size was normal in size. Pericardium: Trivial pericardial effusion is present. Mitral Valve: The mitral valve is normal in structure. Trivial mitral valve regurgitation. No evidence of mitral valve stenosis. Tricuspid Valve: The tricuspid valve is normal in structure. Tricuspid valve regurgitation is not demonstrated. Aortic Valve: The aortic valve is tricuspid. Aortic valve regurgitation is not visualized. No aortic stenosis is present. Pulmonic Valve: The pulmonic valve was normal in structure. Pulmonic valve regurgitation is trivial. Aorta: The aortic root is normal in size and structure. Venous: The inferior vena cava is normal in size with less than  50% respiratory variability, suggesting right atrial pressure of 8 mmHg. IAS/Shunts: No atrial level shunt detected by color flow Doppler.  LEFT VENTRICLE PLAX 2D LVIDd:         4.00 cm     Diastology LV PW:         0.80 cm     LV e' medial:    8.08 cm/s LV IVS:        0.70 cm     LV E/e' medial:  9.5 LVOT diam:     1.70 cm     LV e' lateral:   9.17 cm/s LV SV:         50          LV E/e' lateral: 8.3 LV SV Index:   30 LVOT Area:     2.27 cm  LV Volumes (MOD) LV vol d, MOD A2C: 39.7 ml LV vol s, MOD A2C: 22.7 ml LV SV MOD A2C:     17.0 ml RIGHT VENTRICLE             IVC RV S prime:  16.20 cm/s  IVC diam: 1.60 cm TAPSE (M-mode): 2.1 cm LEFT ATRIUM             Index LA Vol (A2C):   23.8 ml 14.22 ml/m LA Vol (A4C):   33.2 ml 19.84 ml/m LA Biplane Vol: 29.7 ml 17.75 ml/m  AORTIC VALVE             PULMONIC VALVE LVOT Vmax:   113.00 cm/s PR End Diast Vel: 2.31 msec LVOT Vmean:  68.800 cm/s LVOT VTI:    0.221 m  AORTA Ao Root diam: 2.60 cm Ao Asc diam:  2.90 cm MITRAL VALVE MV Area (PHT): 3.37 cm    SHUNTS MV Decel Time: 225 msec    Systemic VTI:  0.22 m MV E velocity: 76.40 cm/s  Systemic Diam: 1.70 cm MV A velocity: 75.60 cm/s MV E/A ratio:  1.01 Dalton McleanMD Electronically signed by Wilfred Lacy Signature Date/Time: 10/24/2022/11:39:00 AM    Final     Cardiac Studies   Cardiac catheterization/PCI drug-eluting stent (10/23/2022)  Conclusion      Mid RCA lesion is 99% stenosed.   A drug-eluting stent was successfully placed using a SYNERGY XD 3.50X28.   Post intervention, there is a 0% residual stenosis.   There is mild left ventricular systolic dysfunction.   LV end diastolic pressure is severely elevated.   The left ventricular ejection fraction is 45-50% by visual estimate.   Acute inferior ST elevation MI secondary to thrombotic occlusion of the mid RCA Successful PTCA/DES x 1 mid RCA No disease in the LAD or Circumflex LVEDP=60mmHg   Recommendations: Will admit to the ICU. Aggrastat  infusion for two hours. DAPT with ASA and Brilinta for one year. High intensity statin. Beta blocker as BP tolerates. Echo tomorrow. Lasix 40 mg IV given as patient was leaving the cath lab.  nary Diagrams  Diagnostic Dominance: Right  Intervention      Patient Profile     Wendy Lopez is a 62 y.o. female with a history of ongoing tobacco abuse, hypertension, and a recent inferior ST elevation myocardial infarction (10/23/22) s/p primary PCI with mid right coronary artery stent who returns with recurrent chest pain and associated nausea and ST segment elevation on EKG similar to her EKG post PCI.   Assessment & Plan    1: Inferior STEMI-postop day 3 large inferolateral STEMI secondary to subtotal occlusion of a mid to distal dominant RCA.  Her left system was free of disease.  She underwent successful PCI drug-eluting stenting by Dr. Clifton James with a 3.5 x 18 mm long Synergy drug-eluting stent.  Her troponins were greater than 24,000.  She had inferobasal hyper hypokinesis with an EF in the 45% range.  She was discharged yesterday afternoon on DAPT (aspirin Brilinta).  She went home and developed nausea and recurrent chest pain.  She Leander Rams presented to the hospital Wendy Lopez EKG showed continued ST segment elevation.  Her troponins have been trending down.  Because of this she was brought back to the Cath Lab last night by Dr.Thukkani revealing unchanged anatomy with a widely patent RCA stent.  She did have a 5660% proximal RCA stenosis.  2: LV dysfunction-moderate inferobasal hypokinesia with an EF of 45%.  She is on losartan and a beta-blocker.  2D echo is pending.  We will decide regarding an SGLT2 after the the result.  Her LVEDP was elevated at 30.  She was put on oral furosemide.  She is on a beta-blocker and losartan.  3: Hyperlipidemia-on low-dose atorvastatin which apparently she has not started as an outpatient.  This will be increased to 80 mg.  4: Essential hypertension-on losartan  and beta-blocker with blood pressures that have been mildly elevated.  Because of her increased heart rate as well I will increase her metoprolol XL to 50 mg and 25 mg.  5: Tobacco abuse-Long history tobacco abuse interested in stopping.  Will discuss pharmacologic options.  Patient continues to be nauseated.  Her troponins are trending down to 10,000.  Her ST segments were elevated when she was tachycardic but now that she is less so her STs have begun to approach baseline.  Her right wrist is somewhat tender and ecchymotic.  Her major complaints now are of nausea.  Unclear the etiology.  I am to get GI to further evaluate.  For questions or updates, please contact [facility] Please consult [email or web address] for contact info under        Signed, Nanetta Batty, MD  10/26/2022, 10:10 AM

## 2022-10-26 NOTE — Progress Notes (Signed)
Patient arrives from cath lab. TR band in place (13cc). Vitals taken, telemetry applied. CCMD notified.   Patient oriented to room. Call bell within reach. Husband at bedside

## 2022-10-26 NOTE — ED Provider Notes (Addendum)
[facility]ERGENCY DEPARTMENT AT [facility] Provider Note   CSN: [id number] Arrival date & time: 10/26/22  6644     History  Chief Complaint  Patient presents with   Chest Pain    Wendy Lopez is a 62 y.o. female.  Patient presents with chest pain and pressure with shortness of breath since approximately 3 PM.  Pain waxes and wanes in severity.  She was recently admitted for STEMI with RCA occlusion on July 7.  She had 1 stent placed.  She states this pain feels similar but not as intense as when she had her heart attack.  States compliance with her medications.  Complains of chest pressure going to both of her shoulders.  Associated with shortness of breath and nausea but no diaphoresis.  EKG shows ST elevation inferiorly and laterally.  Denies any abdominal pain or back pain.  The history is provided by the patient.  Chest Pain Associated symptoms: shortness of breath   Associated symptoms: no dizziness, no fever, no nausea, no vomiting and no weakness        Home Medications Prior to Admission medications   Medication Sig Start Date End Date Taking? Authorizing Provider  aspirin EC 81 MG tablet Take 1 tablet (81 mg total) by mouth daily. Swallow whole. 10/25/22 10/25/23  Arty Baumgartner, NP  atorvastatin (LIPITOR) 80 MG tablet Take 1 tablet (80 mg total) by mouth daily. 10/25/22   Arty Baumgartner, NP  Cyanocobalamin (VITAMIN B-12) 3000 MCG/ML LIQD Inject 3,000 mcg as directed every 30 (thirty) days.    [provider]  EPINEPHrine 0.3 mg/0.3 mL IJ SOAJ injection Inject 0.3 mg into the muscle as needed for anaphylaxis.    [provider]  gabapentin (NEURONTIN) 100 MG capsule Take 1 capsule (100 mg total) by mouth at bedtime. Patient taking differently: Take 100 mg by mouth as needed. 07/11/22   Windell Norfolk, MD  levETIRAcetam (KEPPRA) 750 MG tablet Take 1 tablet (750 mg total) by mouth 2 (two) times daily. 02/25/22 02/20/23  Windell Norfolk,  MD  losartan (COZAAR) 25 MG tablet Take 1 tablet (25 mg total) by mouth daily. 06/17/22 06/12/23  Lennette Bihari, MD  metoprolol succinate (TOPROL-XL) 50 MG 24 hr tablet Take 1 tablet (50 mg total) by mouth daily. Take with or immediately following a meal. 10/25/22   Laverda Page B, NP  nicotine (NICODERM CQ - DOSED IN MG/24 HOURS) 14 mg/24hr patch Place 1 patch (14 mg total) onto the skin daily. 10/25/22 10/25/23  Arty Baumgartner, NP  nitroGLYCERIN (NITROSTAT) 0.4 MG SL tablet Place 1 tablet (0.4 mg total) under the tongue every 5 (five) minutes as needed. 10/25/22   Arty Baumgartner, NP  sodium chloride (OCEAN) 0.65 % nasal spray Place 1-2 sprays into the nose as needed for congestion.    [provider]  sucralfate (CARAFATE) 1 g tablet Take 1 g by mouth 2 (two) times daily. Patient not taking: Reported on 10/24/2022 09/19/22   [provider]  ticagrelor (BRILINTA) 90 MG TABS tablet Take 1 tablet (90 mg total) by mouth 2 (two) times daily. 10/25/22   Arty Baumgartner, NP  Tiotropium Bromide-Olodaterol (STIOLTO RESPIMAT) 2.5-2.5 MCG/ACT AERS Inhale 2 puffs into the lungs daily. Patient taking differently: Inhale 2 puffs into the lungs daily as needed (COPD, bronchitis). 06/14/21   Icard, Rachel Bo, DO  ZYRTEC ALLERGY 10 MG tablet Take 10 mg by mouth at bedtime as needed for allergies.  [provider]      Allergies    Carbamazepine, Lamictal [lamotrigine], Omeprazole, Other, Pantoprazole, Prednisone, Proton pump inhibitors, Pseudoephedrine hcl er, Valium, Amlodipine, Oxycodone, Ibuprofen, Percocet [oxycodone-acetaminophen], Trokendi xr [topiramate er], Betadine [povidone iodine], Latex, and Shellfish allergy    Review of Systems   Review of Systems  Constitutional:  Negative for activity change, appetite change and fever.  HENT:  Negative for congestion and rhinorrhea.   Respiratory:  Positive for chest tightness and shortness of breath.   Cardiovascular:  Positive  for chest pain.  Gastrointestinal:  Negative for nausea and vomiting.  Genitourinary:  Negative for dysuria and hematuria.  Musculoskeletal:  Negative for arthralgias and myalgias.  Skin:  Negative for rash.  Neurological:  Negative for dizziness, weakness and light-headedness.   all other systems are negative except as noted in the HPI and PMH.    Physical Exam Updated Vital Signs BP (!) 152/91   Pulse 86   Temp 98.8 F (37.1 C)   Resp 17   LMP 11/03/1993   SpO2 98%  Physical Exam Vitals and nursing note reviewed.  Constitutional:      General: She is not in acute distress.    Appearance: She is well-developed.     Comments: uncomfortable  HENT:     Head: Normocephalic and atraumatic.     Mouth/Throat:     Pharynx: No oropharyngeal exudate.  Eyes:     Conjunctiva/sclera: Conjunctivae normal.     Pupils: Pupils are equal, round, and reactive to light.  Neck:     Comments: No meningismus. Cardiovascular:     Rate and Rhythm: Normal rate and regular rhythm.     Heart sounds: Normal heart sounds. No murmur heard. Pulmonary:     Effort: Pulmonary effort is normal. No respiratory distress.     Breath sounds: Normal breath sounds.  Abdominal:     Palpations: Abdomen is soft.     Tenderness: There is no abdominal tenderness. There is no guarding or rebound.  Musculoskeletal:        General: No tenderness. Normal range of motion.     Cervical back: Normal range of motion and neck supple.  Skin:    General: Skin is warm.  Neurological:     Mental Status: She is alert and oriented to person, place, and time.     Cranial Nerves: No cranial nerve deficit.     Motor: No abnormal muscle tone.     Coordination: Coordination normal.     Comments: No ataxia on finger to nose bilaterally. No pronator drift. 5/5 strength throughout. CN 2-12 intact.Equal grip strength. Sensation intact.   Psychiatric:        Behavior: Behavior normal.     ED Results / Procedures / Treatments    Labs (all labs ordered are listed, but only abnormal results are displayed) Labs Reviewed  CBC - Abnormal; Notable for the following components:      Result Value   WBC 11.5 (*)    RBC 5.52 (*)    Hemoglobin 16.8 (*)    HCT 49.2 (*)    All other components within normal limits  I-STAT CG4 LACTIC ACID, ED - Abnormal; Notable for the following components:   Lactic Acid, Venous 2.1 (*)    All other components within normal limits  PROTIME-INR  APTT  BASIC METABOLIC PANEL  HEMOGLOBIN A1C  LIPID PANEL  TROPONIN I (HIGH SENSITIVITY)    EKG EKG Interpretation Date/Time:  Wednesday October 26 2022 02:04:59 EDT  Ventricular Rate:  80 PR Interval:  144 QRS Duration:  72 QT Interval:  386 QTC Calculation: 445 R Axis:   74  Text Interpretation: * Critical Test Result: STEMI Normal sinus rhythm Biatrial enlargement Inferior-posterior infarct , possibly acute Lateral injury pattern ** ** ACUTE MI / STEMI ** ** Consider right ventricular involvement in acute inferior infarct Abnormal ECG When compared with ECG of 25-Oct-2022 11:09, PREVIOUS ECG IS PRESENT ST elevation similar to yesterday Confirmed by Glynn Octave [phone]) on 10/26/2022 2:19:28 AM  Radiology ECHOCARDIOGRAM COMPLETE  Result Date: 10/24/2022    ECHOCARDIOGRAM REPORT   Patient Name:   SHALEENA CRUSOE Date of Exam: 10/24/2022 Medical Rec #:  [id number]         Height:       62.0 in Accession #:    [id number]        Weight:       146.2 lb Date of Birth:  1960/12/18          BSA:          1.673 m Patient Age:    61 years          BP:           119/78 mmHg Patient Gender: F                 HR:           66 bpm. Exam Location:  Inpatient Procedure: 2D Echo, Cardiac Doppler and Color Doppler Indications:    CAD of native vessel  History:        Patient has prior history of Echocardiogram examinations, most                 recent 02/26/2018. History of breast cancer,                 Signs/Symptoms:Chest Pain; Risk Factors:Hypertension and  Current                 Smoker.  Sonographer:    Delcie Roch RDCS Referring Phys: [id number] CHRISTOPHER D MCALHANY IMPRESSIONS  1. Left ventricular ejection fraction, by estimation, is 50%. The left ventricle has mildly decreased function. The left ventricle demonstrates regional wall motion abnormalities with basal to mid inferoseptal and basal inferior akinesis. Left ventricular diastolic parameters are consistent with Grade I diastolic dysfunction (impaired relaxation).  2. Right ventricular systolic function is normal. The right ventricular size is normal. Tricuspid regurgitation signal is inadequate for assessing PA pressure.  3. The mitral valve is normal in structure. Trivial mitral valve regurgitation. No evidence of mitral stenosis.  4. The aortic valve is tricuspid. Aortic valve regurgitation is not visualized. No aortic stenosis is present.  5. The inferior vena cava is normal in size with <50% respiratory variability, suggesting right atrial pressure of 8 mmHg. FINDINGS  Left Ventricle: Left ventricular ejection fraction, by estimation, is 50%. The left ventricle has mildly decreased function. The left ventricle demonstrates regional wall motion abnormalities. The left ventricular internal cavity size was normal in size. There is no left ventricular hypertrophy. Left ventricular diastolic parameters are consistent with Grade I diastolic dysfunction (impaired relaxation). Right Ventricle: The right ventricular size is normal. No increase in right ventricular wall thickness. Right ventricular systolic function is normal. Tricuspid regurgitation signal is inadequate for assessing PA pressure. Left Atrium: Left atrial size was normal in size. Right Atrium: Right atrial size was normal in size. Pericardium: Trivial pericardial effusion is present. Mitral Valve: The mitral  valve is normal in structure. Trivial mitral valve regurgitation. No evidence of mitral valve stenosis. Tricuspid Valve: The tricuspid  valve is normal in structure. Tricuspid valve regurgitation is not demonstrated. Aortic Valve: The aortic valve is tricuspid. Aortic valve regurgitation is not visualized. No aortic stenosis is present. Pulmonic Valve: The pulmonic valve was normal in structure. Pulmonic valve regurgitation is trivial. Aorta: The aortic root is normal in size and structure. Venous: The inferior vena cava is normal in size with less than 50% respiratory variability, suggesting right atrial pressure of 8 mmHg. IAS/Shunts: No atrial level shunt detected by color flow Doppler.  LEFT VENTRICLE PLAX 2D LVIDd:         4.00 cm     Diastology LV PW:         0.80 cm     LV e' medial:    8.08 cm/s LV IVS:        0.70 cm     LV E/e' medial:  9.5 LVOT diam:     1.70 cm     LV e' lateral:   9.17 cm/s LV SV:         50          LV E/e' lateral: 8.3 LV SV Index:   30 LVOT Area:     2.27 cm  LV Volumes (MOD) LV vol d, MOD A2C: 39.7 ml LV vol s, MOD A2C: 22.7 ml LV SV MOD A2C:     17.0 ml RIGHT VENTRICLE             IVC RV S prime:     16.20 cm/s  IVC diam: 1.60 cm TAPSE (M-mode): 2.1 cm LEFT ATRIUM             Index LA Vol (A2C):   23.8 ml 14.22 ml/m LA Vol (A4C):   33.2 ml 19.84 ml/m LA Biplane Vol: 29.7 ml 17.75 ml/m  AORTIC VALVE             PULMONIC VALVE LVOT Vmax:   113.00 cm/s PR End Diast Vel: 2.31 msec LVOT Vmean:  68.800 cm/s LVOT VTI:    0.221 m  AORTA Ao Root diam: 2.60 cm Ao Asc diam:  2.90 cm MITRAL VALVE MV Area (PHT): 3.37 cm    SHUNTS MV Decel Time: 225 msec    Systemic VTI:  0.22 m MV E velocity: 76.40 cm/s  Systemic Diam: 1.70 cm MV A velocity: 75.60 cm/s MV E/A ratio:  1.01 Dalton McleanMD Electronically signed by Wilfred Lacy Signature Date/Time: 10/24/2022/11:39:00 AM    Final     Procedures .Critical Care  Performed by: Glynn Octave, MD Authorized by: Glynn Octave, MD   Critical care provider statement:    Critical care time (minutes):  31   Critical care time was exclusive of:  Separately billable  procedures and treating other patients   Critical care was necessary to treat or prevent imminent or life-threatening deterioration of the following conditions: STEMI.   Critical care was time spent personally by me on the following activities:  Development of treatment plan with patient or surrogate, discussions with consultants, evaluation of patient's response to treatment, examination of patient, ordering and review of laboratory studies, ordering and review of radiographic studies, ordering and performing treatments and interventions, pulse oximetry, re-evaluation of patient's condition, review of old charts, blood draw for specimens and obtaining history from patient or surrogate   I assumed direction of critical care for this patient from another provider in my  specialty: no     Care discussed with: admitting provider       Medications Ordered in ED Medications  0.9 %  sodium chloride infusion ( Intravenous New Bag/Given 10/26/22 0222)  aspirin chewable tablet 324 mg (has no administration in time range)  heparin injection 4,000 Units (has no administration in time range)  nitroGLYCERIN 50 mg in dextrose 5 % 250 mL (0.2 mg/mL) infusion (has no administration in time range)    ED Course/ Medical Decision Making/ A&P                             Medical Decision Making Amount and/or Complexity of Data Reviewed Labs: ordered. Decision-making details documented in ED Course. Radiology: ordered and independent interpretation performed. Decision-making details documented in ED Course. ECG/medicine tests: ordered and independent interpretation performed. Decision-making details documented in ED Course.  Risk OTC drugs. Prescription drug management. Decision regarding hospitalization.   Central chest pain since 3 PM, recent STEMI with RCA stent placement.  EKG shows elevation inferiorly laterally similar to yesterday.  Code STEMI activated on arrival.  Discussed with Dr. Lynnette Caffey.  He  recommends IV nitroglycerin, IV heparin and aspirin and to begin working on decreasing her blood pressure  Patient given aspirin, IV nitroglycerin and heparin.  Seen at bedside with Dr. Lynnette Caffey of interventional cardiology.  He does not favor in-stent thrombosis but will take patient to catheterization lab given her ongoing symptoms.  Blood pressure and mental status remained stable throughout ED course. Taken emergently to cath lab with cardiology team.         Final Clinical Impression(s) / ED Diagnoses Final diagnoses:  ST elevation myocardial infarction (STEMI), unspecified artery [facility])    Rx / DC Orders ED Discharge Orders     None         Wise Fees, Jeannett Senior, MD 10/26/22 [id number]    Glynn Octave, MD 10/26/22 [phone]

## 2022-10-26 NOTE — Progress Notes (Signed)
   Patient reports being Rx carfate prior to admission by GI MD but had not started yet. Will order carafate 1gm QID, add pepcid and continue PRN zofran. If no improvement, will consider GI consult. Reviewed with MD  Signed, Laverda Page, NP-C 10/26/2022, 11:37 AM Pager: [phone]

## 2022-10-26 NOTE — ED Triage Notes (Signed)
Pt is coming in for chest tightness that started around 3pm on 10/25/2022. The chest tightness is accompanied by nausea and vomiting, and a palpitation feeling she is expressing anda  pounding of her heart that she feels in her chest and back. Pt states she generally feels weak.

## 2022-10-26 NOTE — ED Notes (Signed)
[facility] called to activate STEMI

## 2022-10-26 NOTE — Plan of Care (Signed)
  Problem: Cardiovascular: Goal: Vascular access site(s) Level 0-1 will be maintained Outcome: Progressing   

## 2022-10-27 ENCOUNTER — Inpatient Hospital Stay (HOSPITAL_COMMUNITY): Payer: Medicare Other

## 2022-10-27 ENCOUNTER — Other Ambulatory Visit: Payer: Self-pay

## 2022-10-27 ENCOUNTER — Encounter (HOSPITAL_COMMUNITY): Payer: Self-pay | Admitting: Internal Medicine

## 2022-10-27 DIAGNOSIS — I2119 ST elevation (STEMI) myocardial infarction involving other coronary artery of inferior wall: Secondary | ICD-10-CM | POA: Diagnosis not present

## 2022-10-27 LAB — COMPREHENSIVE METABOLIC PANEL
ALT: 42 U/L (ref 0–44)
AST: 41 U/L (ref 15–41)
Albumin: 3.6 g/dL (ref 3.5–5.0)
Alkaline Phosphatase: 66 U/L (ref 38–126)
Anion gap: 13 (ref 5–15)
BUN: 19 mg/dL (ref 8–23)
CO2: 25 mmol/L (ref 22–32)
Calcium: 8.8 mg/dL — ABNORMAL LOW (ref 8.9–10.3)
Chloride: 99 mmol/L (ref 98–111)
Creatinine, Ser: 0.83 mg/dL (ref 0.44–1.00)
GFR, Estimated: >60 mL/min (ref >60)
Glucose, Bld: 105 mg/dL — ABNORMAL HIGH (ref 70–99)
Potassium: 3.8 mmol/L (ref 3.5–5.1)
Sodium: 137 mmol/L (ref 135–145)
Total Bilirubin: 1.6 mg/dL — ABNORMAL HIGH (ref 0.3–1.2)
Total Protein: 6.7 g/dL (ref 6.5–8.1)

## 2022-10-27 LAB — CBC
HCT: 43.4 % (ref 36.0–46.0)
Hemoglobin: 14.9 g/dL (ref 12.0–15.0)
MCH: 30.7 pg (ref 26.0–34.0)
MCHC: 34.3 g/dL (ref 30.0–36.0)
MCV: 89.3 fL (ref 80.0–100.0)
Platelets: 143 10*3/uL — ABNORMAL LOW (ref 150–400)
RBC: 4.86 MIL/uL (ref 3.87–5.11)
RDW: 12.7 % (ref 11.5–15.5)
WBC: 12.6 10*3/uL — ABNORMAL HIGH (ref 4.0–10.5)
nRBC: 0 % (ref 0.0–0.2)

## 2022-10-27 LAB — HEMOGLOBIN A1C
Hgb A1c MFr Bld: 5.6 % (ref 4.8–5.6)
Mean Plasma Glucose: 114 mg/dL

## 2022-10-27 MED ORDER — CETIRIZINE HCL 10 MG PO TABS
10.0000 mg | ORAL_TABLET | Freq: Every day | ORAL | Status: DC
  Administered 2022-10-27 – 2022-10-28 (×2): 10 mg via ORAL
  Filled 2022-10-27 (×2): qty 1

## 2022-10-27 MED ORDER — SENNOSIDES-DOCUSATE SODIUM 8.6-50 MG PO TABS
1.0000 | ORAL_TABLET | Freq: Two times a day (BID) | ORAL | Status: DC
  Administered 2022-10-27 – 2022-10-28 (×3): 1 via ORAL
  Filled 2022-10-27 (×2): qty 1

## 2022-10-27 MED ORDER — IOHEXOL 350 MG/ML SOLN
75.0000 mL | Freq: Once | INTRAVENOUS | Status: AC | PRN
  Administered 2022-10-27: 75 mL via INTRAVENOUS

## 2022-10-27 NOTE — Progress Notes (Signed)
    Received a page from nursing staff that ECG obtained today shows STEMI. Patient without chest pain today and on my review, ECG shows inferior ST segment elevation that is stable/slightly improved when compared with 7/10 tracing. She is also s/p re-look LHC on 7/10 that showed "widely patent right coronary artery stent."  Perlie Gold, PA-C

## 2022-10-27 NOTE — Progress Notes (Signed)
Pt request for Pepcid to be discontinued - stated she is unable to take PPI per PCP/GI MD.

## 2022-10-27 NOTE — Progress Notes (Signed)
Mobility Specialist Progress Note:   10/27/22 1522  Mobility  Activity Refused mobility    Pt refused mobility d/t fatigue and nausea. Will f/u as able.    Leory Plowman  Mobility Specialist Please contact via [employer] office at [phone]

## 2022-10-27 NOTE — Progress Notes (Signed)
Rounding Note    Patient Name: KARSON CHICAS Date of Encounter: 10/27/2022  [facility] Cardiologist: Nicki Guadalajara, MD   Subjective   Patient readmitted with nausea and chest pain after being discharged home .  Relook cath revealed widely patent RCA stent.  Her major issue now is ongoing nausea.  Inpatient Medications    Scheduled Meds:  aspirin  81 mg Oral Daily   atorvastatin  80 mg Oral Daily   levETIRAcetam  750 mg Oral BID   losartan  50 mg Oral Daily   metoprolol succinate  100 mg Oral Daily   senna-docusate  1 tablet Oral BID   sodium chloride flush  3 mL Intravenous Q12H   sucralfate  1 g Oral TID WC & HS   ticagrelor  90 mg Oral BID   Continuous Infusions:  sodium chloride 20 mL/hr at 10/26/22 0222   sodium chloride     PRN Meds: sodium chloride, acetaminophen, hydrOXYzine, ondansetron (ZOFRAN) IV, sodium chloride flush   Vital Signs    Vitals:   10/26/22 1932 10/27/22 0034 10/27/22 0417 10/27/22 0747  BP: 132/78 (!) 148/87 (!) 158/91 (!) 160/93  Pulse: 89     Resp: 20 16 14 13   Temp: 98 F (36.7 C) 98.7 F (37.1 C) 98.6 F (37 C) 98.4 F (36.9 C)  TempSrc: Oral Oral Oral Oral  SpO2: 100% 95% 94% 96%    Intake/Output Summary (Last 24 hours) at 10/27/2022 0933 Last data filed at 10/26/2022 2224 Gross per 24 hour  Intake 160.3 ml  Output --  Net 160.3 ml      10/23/2022   12:22 PM 10/07/2022    1:03 PM 07/11/2022    3:30 PM  Last 3 Weights  Weight (lbs) 146 lb 3.7 oz 141 lb 139 lb 3.2 oz  Weight (kg) 66.33 kg 63.957 kg 63.141 kg      Telemetry    Sinus rhythm with PVCs- Personally Reviewed  ECG    Performed.- Personally Reviewed  Physical Exam   GEN: No acute distress.   Neck: No JVD Cardiac: RRR, no murmurs, rubs, or gallops.  Respiratory: Clear to auscultation bilaterally. GI: Soft, nontender, non-distended  MS: No edema; No deformity. Neuro:  Nonfocal  Psych: Normal affect   Labs    High Sensitivity Troponin:    Recent Labs  Lab 10/23/22 1209 10/23/22 1420 10/26/22 0217 10/26/22 0637  TROPONINIHS 68* >24,000* 15,363* 10,738*     Chemistry Recent Labs  Lab 10/23/22 1209 10/23/22 1252 10/23/22 1638 10/24/22 0337 10/25/22 0157 10/26/22 0217  NA 142   < >  --  136 137 139  K 3.9   < >  --  4.8 3.6 4.8  CL 105   < >  --  104 101 101  CO2 22  --   --  22 27 22   GLUCOSE 149*   < >  --  125* 98 155*  BUN 12   < >  --  14 13 22   CREATININE 0.95   < >  --  0.83 0.90 1.04*  CALCIUM 8.5*  --   --  8.9 8.7* 9.3  MG  --   --  1.9  --  2.0  --   PROT 5.8*  --   --   --   --   --   ALBUMIN 3.3*  --   --   --   --   --   AST 43*  --   --   --   --   --  ALT 30  --   --   --   --   --   ALKPHOS 67  --   --   --   --   --   BILITOT 1.4*  --   --   --   --   --   GFRNONAA >60  --   --  >60 >60 >60  ANIONGAP 15  --   --  10 9 16*   < > = values in this interval not displayed.    Lipids  Recent Labs  Lab 10/26/22 0217  CHOL 197  TRIG 71  HDL 70  LDLCALC 113*  CHOLHDL 2.8    Hematology Recent Labs  Lab 10/23/22 1209 10/23/22 1252 10/24/22 0802 10/26/22 0217  WBC 13.3*  --  11.1* 11.5*  RBC 5.07  --  5.21* 5.52*  HGB 15.2* 14.6 15.7* 16.8*  HCT 44.5 43.0 45.2 49.2*  MCV 87.8  --  86.8 89.1  MCH 30.0  --  30.1 30.4  MCHC 34.2  --  34.7 34.1  RDW 12.7  --  12.7 12.6  PLT 164  --  159 174   Thyroid No results for input(s): "TSH", "FREET4" in the last 168 hours.  BNPNo results for input(s): "BNP", "PROBNP" in the last 168 hours.  DDimer No results for input(s): "DDIMER" in the last 168 hours.   Radiology    CARDIAC CATHETERIZATION  Result Date: 10/26/2022   Non-stenotic Mid RCA lesion was previously treated. 1.  Widely patent right coronary artery stent. 2.  LVEDP of 14 mmHg. Recommendation: Medical therapy.   DG Chest Port 1 View  Result Date: 10/26/2022 CLINICAL DATA:  Chest pain, suspected STEMI, nausea. EXAM: PORTABLE CHEST 1 VIEW COMPARISON:  Partial chest CT for  coronary artery calcium scoring 06/14/2022, PA and lateral chest 03/23/2011. FINDINGS: There is mild cardiomegaly without evidence of CHF. There is calcification in the aortic arch, with a normal mediastinal configuration. There is scattered linear scarring or atelectasis in the lung bases. No substantial pleural effusion. The lungs are otherwise clear. Osteopenia. Degenerative change thoracic spine. Multiple overlying monitor wires. IMPRESSION: 1. Mild cardiomegaly without evidence of CHF. 2. Aortic atherosclerosis. 3. Linear scarring or atelectasis in the lung bases. Electronically Signed   By: Almira Bar M.D.   On: 10/26/2022 02:31    Cardiac Studies   Cardiac catheterization/PCI drug-eluting stent (10/23/2022)  Conclusion      Mid RCA lesion is 99% stenosed.   A drug-eluting stent was successfully placed using a SYNERGY XD [id number].   Post intervention, there is a 0% residual stenosis.   There is mild left ventricular systolic dysfunction.   LV end diastolic pressure is severely elevated.   The left ventricular ejection fraction is 45-50% by visual estimate.   Acute inferior ST elevation MI secondary to thrombotic occlusion of the mid RCA Successful PTCA/DES x 1 mid RCA No disease in the LAD or Circumflex LVEDP=45mmHg   Recommendations: Will admit to the ICU. Aggrastat infusion for two hours. DAPT with ASA and Brilinta for one year. High intensity statin. Beta blocker as BP tolerates. Echo tomorrow. Lasix 40 mg IV given as patient was leaving the cath lab.  nary Diagrams  Diagnostic Dominance: Right  Intervention     2D echocardiogram (10/24/2022)  IMPRESSIONS     1. Left ventricular ejection fraction, by estimation, is 50%. The left  ventricle has mildly decreased function. The left ventricle demonstrates  regional wall motion abnormalities with basal to  mid inferoseptal and  basal inferior akinesis. Left  ventricular diastolic parameters are consistent with Grade I  diastolic  dysfunction (impaired relaxation).   2. Right ventricular systolic function is normal. The right ventricular  size is normal. Tricuspid regurgitation signal is inadequate for assessing  PA pressure.   3. The mitral valve is normal in structure. Trivial mitral valve  regurgitation. No evidence of mitral stenosis.   4. The aortic valve is tricuspid. Aortic valve regurgitation is not  visualized. No aortic stenosis is present.   5. The inferior vena cava is normal in size with <50% respiratory  variability, suggesting right atrial pressure of 8 mmHg.    Patient Profile     BRITANEE VANBLARCOM is a 62 y.o. female with a history of ongoing tobacco abuse, hypertension, and a recent inferior ST elevation myocardial infarction (10/23/22) s/p primary PCI with mid right coronary artery stent who returns with recurrent chest pain and associated nausea and ST segment elevation on EKG similar to her EKG post PCI.  Her major issue now is nausea.  Assessment & Plan    1: Inferior STEMI-postop day 4 large inferolateral STEMI secondary to subtotal occlusion of a mid to distal dominant RCA.  Her left system was free of disease.  She underwent successful PCI drug-eluting stenting by Dr. Clifton James with a 3.5 x 18 mm long Synergy drug-eluting stent.  Her troponins were greater than 24,000.  She had inferobasal hyper hypokinesis with an EF in the 45% range.  She was discharged yesterday afternoon on DAPT (aspirin Brilinta).  She went home and developed nausea and recurrent chest pain.  She re presented to the hospital with EKG showing continued ST segment elevation.  Her troponins have been trending down.  Because of this she was brought back to the Cath Lab by Dr.Thukkani revealing unchanged anatomy with a widely patent RCA stent.  She did have a 50-60% proximal RCA stenosis.  2: LV dysfunction-moderate inferobasal hypokinesia with an EF of 50%.  She is on losartan and a beta-blocker.    We will decide  regarding an SGLT2 after the the result.  Her LVEDP was elevated at 30.  There is formulates his daughter. She  is on a beta-blocker and losartan.  3: Hyperlipidemia-on low-dose atorvastatin which apparently she has not started as an outpatient.  This will be increased to 80 mg.  4: Essential hypertension-on losartan and beta-blocker with blood pressures that have been mildly elevated.  Because of her increased heart rate as well I will increase her metoprolol XL to 50 mg and 25 mg.  5: Tobacco abuse-Long history tobacco abuse interested in stopping.  Will discuss pharmacologic options.  Patient continues to be nauseated.  Her troponins are trending down to 10,000.  Her ST segments were elevated when she was tachycardic but now that she is less so her STs have begun to approach baseline.   Her major complaints now are of nausea.  Unclear the etiology.  I spoke with Dr. Vida Rigger , [facility] , who has agreed to see her later today.  Interesting  For questions or updates, please contact [facility] Please consult [email or web address] for contact info under        Signed, Nanetta Batty, MD  10/27/2022, 9:33 AM

## 2022-10-27 NOTE — Consult Note (Signed)
Referring Provider: Dr. Allyson Sabal Primary Care Physician:  Georgann Housekeeper, MD Primary Gastroenterologist:  Dr. Levora Angel  Reason for Consultation:  Nausea/vomiting  HPI: Wendy Lopez is a 62 y.o. female admitted for chest pain in the setting of known CAD and s/p heart cath with coronary stent placement seen for a GI consult due to severe nausea. She has had intermittent nausea/vomiting since the spring and when they started to occur she also had diarrhea with it but the diarrhea has since resolved. She was seen in early June by Dr. Levora Angel who did an EGD/colon on 10/13/22 and EGD showed gastritis and a small hiatal hernia. Colonoscopy showed small internal hemorrhoids and adenomatous and sessile serrated polyps were removed. Her N/V and heartburn worsened this past Sunday (10/23/22) and she had chest pain with it as well. Having nausea throughout the day with intermittent vomiting. Starting this past Sunday, heartburn also worsened and developed pain with swallowing anything even water. Has severe diarrhea and worsened nausea with PPIs. Carafate caused severe belching, Intolerance to Pepcid. Denies rectal bleeding or melena.   Past Medical History:  Diagnosis Date   Anxiety and depression    Arthritis    Breast cancer (HCC) 02/13/2009   Cancer of right breast (HCC) 04/16/2012   Depression    Depression 2014   Dysrhythmia    palpitations   Epilepsy (HCC)    last seizure 1990's   H/O hiatal hernia    Localization-related (focal) (partial) epilepsy and epileptic syndromes with complex partial seizures, without mention of intractable epilepsy 02/07/2013   Memory deficit 02/07/2013   Memory loss    Mitral valve prolapse 06/01/10   echo- EF>55% mild tricupsid regurgitation. There is mild Pulmonary Hypertension.. Right  ventricular systolic pressure iselevated at 30-40 mmHg. LV systolic function is normal.   Osteopenia    PONV (postoperative nausea and vomiting)    SOB (shortness of breath)  10/18/11   met-test   Spinal fusion failure    3    Past Surgical History:  Procedure Laterality Date   BACK SURGERY     screws- lumbar   BREAST SURGERY  2010   mastectomy - RIGHT   BRONCHIAL WASHINGS  04/01/2021   Procedure: BRONCHIAL WASHINGS;  Surgeon: Josephine Igo, DO;  Location: [facility];  Service: Cardiopulmonary;;   COLONOSCOPY WITH PROPOFOL  03/13/2012   Procedure: COLONOSCOPY WITH PROPOFOL;  Surgeon: Charolett Bumpers, MD;  Location: [facility];  Service: Endoscopy;  Laterality: N/A;   CORONARY/GRAFT ACUTE MI REVASCULARIZATION N/A 10/23/2022   Procedure: Coronary/Graft Acute MI Revascularization;  Surgeon: Kathleene Hazel, MD;  Location: [facility];  Service: Cardiovascular;  Laterality: N/A;   ESOPHAGOGASTRODUODENOSCOPY  03/13/2012   Procedure: ESOPHAGOGASTRODUODENOSCOPY (EGD);  Surgeon: Charolett Bumpers, MD;  Location: [facility];  Service: Endoscopy;  Laterality: N/A;  Reflux, Hiatal Hernia   HAND TENDON SURGERY  2012   tendons released in right hand    LEFT HEART CATH AND CORONARY ANGIOGRAPHY N/A 10/23/2022   Procedure: LEFT HEART CATH AND CORONARY ANGIOGRAPHY;  Surgeon: Kathleene Hazel, MD;  Location: [facility] CV [facility];  Service: Cardiovascular;  Laterality: N/A;   LEFT HEART CATH AND CORONARY ANGIOGRAPHY N/A 10/26/2022   Procedure: LEFT HEART CATH AND CORONARY ANGIOGRAPHY;  Surgeon: Orbie Pyo, MD;  Location: [facility] CV [facility];  Service: Cardiovascular;  Laterality: N/A;   MASTECTOMY, RADICAL  02/13/2009   SPINE SURGERY  1999, 2003, 2005   TONSILLECTOMY     VIDEO BRONCHOSCOPY Bilateral 04/01/2021  Procedure: VIDEO BRONCHOSCOPY WITHOUT FLUORO;  Surgeon: Josephine Igo, DO;  Location: [facility];  Service: Cardiopulmonary;  Laterality: Bilateral;  Ultrathin bronchoscopy    Prior to Admission medications   Medication Sig Start Date End Date Taking? Authorizing Provider  aspirin EC 81 MG tablet Take 1 tablet (81 mg total) by  mouth daily. Swallow whole. 10/25/22 10/25/23 Yes Arty Baumgartner, NP  Cyanocobalamin (VITAMIN B-12) 3000 MCG/ML LIQD Inject 3,000 mcg as directed every 30 (thirty) days.   Yes [provider]  EPINEPHrine 0.3 mg/0.3 mL IJ SOAJ injection Inject 0.3 mg into the muscle as needed for anaphylaxis.   Yes [provider]  gabapentin (NEURONTIN) 100 MG capsule Take 1 capsule (100 mg total) by mouth at bedtime. Patient taking differently: Take 100 mg by mouth as needed (pain). 07/11/22  Yes Windell Norfolk, MD  levETIRAcetam (KEPPRA) 750 MG tablet Take 1 tablet (750 mg total) by mouth 2 (two) times daily. 02/25/22 02/20/23 Yes Camara, Amalia Hailey, MD  losartan (COZAAR) 25 MG tablet Take 1 tablet (25 mg total) by mouth daily. 06/17/22 06/12/23 Yes Lennette Bihari, MD  metoprolol succinate (TOPROL-XL) 50 MG 24 hr tablet Take 1 tablet (50 mg total) by mouth daily. Take with or immediately following a meal. 10/25/22  Yes Laverda Page B, NP  nitroGLYCERIN (NITROSTAT) 0.4 MG SL tablet Place 1 tablet (0.4 mg total) under the tongue every 5 (five) minutes as needed. 10/25/22  Yes Arty Baumgartner, NP  sodium chloride (OCEAN) 0.65 % nasal spray Place 1-2 sprays into the nose as needed for congestion.   Yes [provider]  sucralfate (CARAFATE) 1 g tablet Take 1 g by mouth 2 (two) times daily. 09/19/22  Yes [provider]  ticagrelor (BRILINTA) 90 MG TABS tablet Take 1 tablet (90 mg total) by mouth 2 (two) times daily. 10/25/22  Yes Arty Baumgartner, NP  Tiotropium Bromide-Olodaterol (STIOLTO RESPIMAT) 2.5-2.5 MCG/ACT AERS Inhale 2 puffs into the lungs daily. Patient taking differently: Inhale 2 puffs into the lungs daily as needed (COPD, bronchitis). 06/14/21  Yes Icard, Rachel Bo, DO  ZYRTEC ALLERGY 10 MG tablet Take 10 mg by mouth at bedtime as needed for allergies.   Yes [provider]  atorvastatin (LIPITOR) 80 MG tablet Take 1 tablet (80 mg total) by mouth daily. Patient not  taking: Reported on 10/26/2022 10/25/22   Laverda Page B, NP  nicotine (NICODERM CQ - DOSED IN MG/24 HOURS) 14 mg/24hr patch Place 1 patch (14 mg total) onto the skin daily. Patient not taking: Reported on 10/26/2022 10/25/22 10/25/23  Arty Baumgartner, NP    Scheduled Meds:  aspirin  81 mg Oral Daily   atorvastatin  80 mg Oral Daily   levETIRAcetam  750 mg Oral BID   losartan  50 mg Oral Daily   metoprolol succinate  100 mg Oral Daily   senna-docusate  1 tablet Oral BID   sodium chloride flush  3 mL Intravenous Q12H   sucralfate  1 g Oral TID WC & HS   ticagrelor  90 mg Oral BID   Continuous Infusions:  sodium chloride 20 mL/hr at 10/26/22 0222   sodium chloride     PRN Meds:.sodium chloride, acetaminophen, hydrOXYzine, ondansetron (ZOFRAN) IV, sodium chloride flush  Allergies as of 10/26/2022 - Review Complete 10/26/2022  Allergen Reaction Noted   Carbamazepine Anaphylaxis 09/16/2010   Lamictal [lamotrigine] Anaphylaxis 09/23/2014   Omeprazole Anaphylaxis 10/24/2022   Other  03/08/2012   Pantoprazole Anaphylaxis 10/24/2022  Prednisone Other (See Comments), Rash, and Shortness Of Breath 09/16/2010   Proton pump inhibitors Anaphylaxis 10/24/2022   Pseudoephedrine hcl er Other (See Comments) 08/05/2011   Valium Other (See Comments) 09/16/2010   Amlodipine Rash 02/26/2015   Oxycodone Nausea And Vomiting, Rash, and Other (See Comments) 09/16/2010   Ibuprofen Swelling 08/26/2011   Percocet [oxycodone-acetaminophen] Nausea And Vomiting 03/09/2012   Trokendi xr [topiramate er] Other (See Comments) 02/01/2021   Betadine [povidone iodine] Rash 10/18/2016   Latex Dermatitis and Rash 03/21/2011   Shellfish allergy Swelling 10/18/2016    Family History  Problem Relation Age of Onset   Arthritis Mother        rhuematoid   Heart disease Mother        Atrial fib   Heart disease Father    Heart disease Sister        Atrial fib    Social History   Socioeconomic History    Marital status: Married    Spouse name: Not on file   Number of children: 1   Years of education: college 2   Highest education level: Not on file  Occupational History   Occupation: unemployed    Employer: UNEMPLOYED    Comment: 2003  Tobacco Use   Smoking status: Every Day    Current packs/day: 1.00    Average packs/day: 1 pack/day for 43.0 years (43.0 ttl pk-yrs)    Types: Cigarettes   Smokeless tobacco: Never   Tobacco comments:    1/2 pk per day  Substance and Sexual Activity   Alcohol use: No    Alcohol/week: 0.0 standard drinks of alcohol   Drug use: No   Sexual activity: Not on file  Other Topics Concern   Not on file  Social History Narrative   Patient lives at home with husband   Patient is right handed.   Patient does not drink caffeine.   Social Determinants of Health   Financial Resource Strain: Not on file  Food Insecurity: No Food Insecurity (10/27/2022)   Hunger Vital Sign    Worried About Running Out of Food in the Last Year: Never true    Ran Out of Food in the Last Year: Never true  Transportation Needs: No Transportation Needs (10/27/2022)   PRAPARE - Administrator, Civil Service (Medical): No    Lack of Transportation (Non-Medical): No  Physical Activity: Not on file  Stress: Not on file  Social Connections: Not on file  Intimate Partner Violence: Not At Risk (10/27/2022)   Humiliation, Afraid, Rape, and Kick questionnaire    Fear of Current or Ex-Partner: No    Emotionally Abused: No    Physically Abused: No    Sexually Abused: No    Review of Systems: All negative except as stated above in HPI.  Physical Exam: Vital signs: Vitals:   10/27/22 0747 10/27/22 1214  BP: (!) 160/93 (!) 153/93  Pulse:    Resp: 13 19  Temp: 98.4 F (36.9 C) 98.1 F (36.7 C)  SpO2: 96% 94%  P 89  Last BM Date :  (PTA) General:  Lethargic, thin, no acute distress Head: normocephalic, atraumatic Eyes: anicteric sclera ENT: oropharynx  clear Neck: supple, nontender Lungs:  Clear throughout to auscultation.   No wheezes, crackles, or rhonchi. No acute distress. Heart:  Regular rate and rhythm; no murmurs, clicks, rubs,  or gallops. Abdomen: epigastric and LUQ tenderness with guarding, soft, nondistended, +BS  Rectal:  Deferred Ext: no edema  GI:  Lab Results: Recent Labs    10/26/22 0217 10/27/22 0953  WBC 11.5* 12.6*  HGB 16.8* 14.9  HCT 49.2* 43.4  PLT 174 143*   BMET Recent Labs    10/25/22 0157 10/26/22 0217 10/27/22 0953  NA 137 139 137  K 3.6 4.8 3.8  CL 101 101 99  CO2 27 22 25   GLUCOSE 98 155* 105*  BUN 13 22 19   CREATININE 0.90 1.04* 0.83  CALCIUM 8.7* 9.3 8.8*   LFT Recent Labs    10/27/22 0953  PROT 6.7  ALBUMIN 3.6  AST 41  ALT 42  ALKPHOS 66  BILITOT 1.6*   PT/INR Recent Labs    10/26/22 0217  LABPROT 13.1  INR 1.0     Studies/Results: CARDIAC CATHETERIZATION  Result Date: 10/26/2022   Non-stenotic Mid RCA lesion was previously treated. 1.  Widely patent right coronary artery stent. 2.  LVEDP of 14 mmHg. Recommendation: Medical therapy.   DG Chest Port 1 View  Result Date: 10/26/2022 CLINICAL DATA:  Chest pain, suspected STEMI, nausea. EXAM: PORTABLE CHEST 1 VIEW COMPARISON:  Partial chest CT for coronary artery calcium scoring 06/14/2022, PA and lateral chest 03/23/2011. FINDINGS: There is mild cardiomegaly without evidence of CHF. There is calcification in the aortic arch, with a normal mediastinal configuration. There is scattered linear scarring or atelectasis in the lung bases. No substantial pleural effusion. The lungs are otherwise clear. Osteopenia. Degenerative change thoracic spine. Multiple overlying monitor wires. IMPRESSION: 1. Mild cardiomegaly without evidence of CHF. 2. Aortic atherosclerosis. 3. Linear scarring or atelectasis in the lung bases. Electronically Signed   By: Almira Bar M.D.   On: 10/26/2022 02:31    Impression/Plan: 62 yo with recurrent  nausea/vomiting and abdominal pain with unrevealing EGD/colonoscopy. Needs an abd/pelvis CT with contrast to further evaluate. Continue symptomatic management. Suspect her symptoms are mainly psychosomatic. Supportive care. Dr. Ewing Schlein will f/u tomorrow.    LOS: 1 day   Shirley Friar  10/27/2022, 3:13 PM  Questions please call [phone]

## 2022-10-27 NOTE — Plan of Care (Signed)

## 2022-10-28 DIAGNOSIS — I2119 ST elevation (STEMI) myocardial infarction involving other coronary artery of inferior wall: Secondary | ICD-10-CM | POA: Diagnosis not present

## 2022-10-28 LAB — BASIC METABOLIC PANEL
Anion gap: 12 (ref 5–15)
BUN: 18 mg/dL (ref 8–23)
CO2: 24 mmol/L (ref 22–32)
Calcium: 8.6 mg/dL — ABNORMAL LOW (ref 8.9–10.3)
Chloride: 100 mmol/L (ref 98–111)
Creatinine, Ser: 0.86 mg/dL (ref 0.44–1.00)
GFR, Estimated: >60 mL/min (ref >60)
Glucose, Bld: 97 mg/dL (ref 70–99)
Potassium: 3.8 mmol/L (ref 3.5–5.1)
Sodium: 136 mmol/L (ref 135–145)

## 2022-10-28 LAB — LIPASE, BLOOD: Lipase: 34 U/L (ref 11–51)

## 2022-10-28 MED ORDER — NICOTINE 14 MG/24HR TD PT24: 14.0000 mg | MEDICATED_PATCH | Freq: Every day | TRANSDERMAL | Status: DC

## 2022-10-28 MED ORDER — ONDANSETRON 4 MG PO TBDP: 4.0000 mg | ORAL_TABLET | Freq: Three times a day (TID) | ORAL | 0 refills | Status: DC | PRN

## 2022-10-28 MED ORDER — METOPROLOL SUCCINATE ER 100 MG PO TB24: 100.0000 mg | ORAL_TABLET | Freq: Every day | ORAL | 1 refills | Status: DC

## 2022-10-28 MED ORDER — LOSARTAN POTASSIUM 100 MG PO TABS: 100.0000 mg | ORAL_TABLET | Freq: Every day | ORAL | 1 refills | Status: DC

## 2022-10-28 MED ORDER — ENOXAPARIN SODIUM 40 MG/0.4ML IJ SOSY
40.0000 mg | PREFILLED_SYRINGE | INTRAMUSCULAR | Status: DC
  Filled 2022-10-28: qty 0.4

## 2022-10-28 MED ORDER — SUCRALFATE 1 G PO TABS: 1.0000 g | ORAL_TABLET | Freq: Four times a day (QID) | ORAL | 0 refills | Status: DC

## 2022-10-28 MED ORDER — LOSARTAN POTASSIUM 50 MG PO TABS
100.0000 mg | ORAL_TABLET | Freq: Every day | ORAL | Status: DC
  Administered 2022-10-28: 100 mg via ORAL
  Filled 2022-10-28: qty 2

## 2022-10-28 NOTE — Progress Notes (Signed)
Mobility Specialist Progress Note:   10/28/22 1203  Mobility  Activity Ambulated with assistance in hallway  Level of Assistance Independent  Assistive Device None  Distance Ambulated (ft) 80 ft  RUE Weight Bearing WBAT  Activity Response Tolerated well  Mobility Referral Yes  $Mobility charge 1 Mobility  Mobility Specialist Start Time (ACUTE ONLY) 1142  Mobility Specialist Stop Time (ACUTE ONLY) 1152  Mobility Specialist Time Calculation (min) (ACUTE ONLY) 10 min   Pt received in bed, agreeable to mobility. C/o nausea during ambulation, otherwise asymptomatic throughout. Pt returned to bed with call bell near and all needs met. Family present.   Leory Plowman  Mobility Specialist Please contact via [employer] office at [phone]

## 2022-10-28 NOTE — Discharge Summary (Addendum)
Discharge Summary    Wendy Lopez ID: SHAUNESSY ALA MRN: [id number]; DOB: 10/07/1960  Admit date: 10/26/2022 Discharge date: 10/28/2022  PCP:  Georgann Housekeeper, MD   [facility] Providers Cardiologist:  Nicki Guadalajara, MD        Discharge Diagnoses    Principal Problem:   Chest pain Active Problems:   History of MI (myocardial infarction)   Unstable angina [name]   Acute myocardial infarction [facility])    Diagnostic Studies/Procedures    10/26/22 Re-Look LHC    Non-stenotic Mid RCA lesion was previously treated.   1.  Widely patent right coronary artery stent. 2.  LVEDP of 14 mmHg.   Recommendation: Medical therapy.  Diagnostic Dominance: Right  _____________   History of Present Illness     Wendy Lopez Lopez is a 62 y.o. female with a history of ongoing tobacco abuse, hypertension, and a recent inferior ST elevation myocardial infarction (10/23/22) s/p primary PCI with mid right coronary artery stent who returns with recurrent chest pain and associated nausea and ST segment elevation on EKG similar to her EKG post PCI.   Wendy Lopez Lopez discharged from the hospital around 1 PM on 10/25/2022.  Within a few hours she developed gnawing, intermittent chest pain about 4 out of 10 in severity with nausea and vomiting.  Her chest pain was nowhere near the pain that she experienced with her recent STEMI and while the pain is intermittent, and stable at a 4 in severity.  However her nausea and associated vomiting continued to get worse prompting her to come into the emergency room for evaluation.   On presentation her EKG showed persistent ST elevation in the inferior leads similar to her prior ECGs. Due to ongoing chest pain of uncertain etiology, the Wendy Lopez was referred for emergent coronary angiography.  Hospital Course     Consultants: [facility]   S/P Inferior STEMI   Wendy Lopez POD 5 from inferolateral STEMI with subtotal occlusion of mid to distal dominant RCA.  She had successful PCI with Dr. Clifton James and was subsequently discharged. Wendy Lopez returned to the hospital on 7/10 with nausea and recurrent chest pain. ECG continued to show ST elevation in inferolateral pattern and she was taken back to the cath lab for re-look. Found with unchanged coronary anatomy and widely patent RCA. She does have 50-60% proximal RCA stenosis.    Continue DAPT with ASA/Brilinta Continue Metoprolol Succinate 100mg  Close outpatient follow up arranged.   GI upset   Wendy Lopez with severe nausea and intermittent emesis this admission (but also occurring over recent months prompting GI evaluation). She is s/p EGD/colonoscopy on 10/13/22 which showed gastritis and small hiatal hernia. No acute findings. Since LHC, Wendy Lopez with worsening nausea/vomiting and heartburn.   Wendy Lopez's GI group, [facility] has seen Wendy Lopez. Greatly appreciate their assistance. CT abd/pelvis w/ contrast on 7/11 did not reveal any acute abnormality. Lipase WNL today.  Per GI notes today, "try half a carafate 4 times a day." They will arrange outpatient follow up and consider trial of new PO acid blocker. Zofran ODT 4mg  provided at discharge.   LV dysfunction   Wendy Lopez with inferobasal hypokinesis with LVEF 45-50% in setting of inferolateral STEMI.   Given acute GI upset, will continue to defer initiation of SGLT2 and Spironolactone to outpatient follow up.  Continue Losartan, increase to 100mg  today. As above, with GI upset, will not change medications while inpatient but need to consider transition to [facility] with outpatient follow up.  Continue Metoprolol Succinate 100mg .  Hyperlipidemia   Continue Atorvastatin 80mg .   Hypertension   Wendy Lopez remained hypertensive this admission. Increase Losartan to 100mg . Continue Metoprolol Succinate 100mg . May need to consider switch to Coreg if BP remains high.    Tobacco abuse   Cessation discussed   Hx Seizures   Continue home dose Keppra.        Did the Wendy Lopez have an acute coronary syndrome (MI, NSTEMI, STEMI, etc) this admission?:  No                               Did the Wendy Lopez have a percutaneous coronary intervention (stent / angioplasty)?:  No.        The Wendy Lopez has been scheduled for a TOC follow up appointment.  Discharge Vitals Blood pressure (!) 163/91, pulse 69, temperature 98.4 F (36.9 C), temperature source Oral, resp. rate 19, height 5\' 2"  (1.575 m), weight 66.3 kg, last menstrual period 11/03/1993, SpO2 96%.  Filed Weights   10/27/22 0830  Weight: 66.3 kg    Labs & Radiologic Studies    CBC Recent Labs    10/26/22 0217 10/27/22 0953  WBC 11.5* 12.6*  HGB 16.8* 14.9  HCT 49.2* 43.4  MCV 89.1 89.3  PLT 174 143*   Basic Metabolic Panel Recent Labs    40/98/11 0953 10/28/22 0826  NA 137 136  K 3.8 3.8  CL 99 100  CO2 25 24  GLUCOSE 105* 97  BUN 19 18  CREATININE 0.83 0.86  CALCIUM 8.8* 8.6*   Liver Function Tests Recent Labs    10/27/22 0953  AST 41  ALT 42  ALKPHOS 66  BILITOT 1.6*  PROT 6.7  ALBUMIN 3.6   Recent Labs    10/28/22 0826  LIPASE 34   High Sensitivity Troponin:   Recent Labs  Lab 10/23/22 1209 10/23/22 1420 10/26/22 0217 10/26/22 0637  TROPONINIHS 68* >24,000* 15,363* 10,738*    BNP Invalid input(s): "POCBNP" D-Dimer No results for input(s): "DDIMER" in the last 72 hours. Hemoglobin A1C Recent Labs    10/26/22 0217  HGBA1C 5.6   Fasting Lipid Panel Recent Labs    10/26/22 0217  CHOL 197  HDL 70  LDLCALC 113*  TRIG 71  CHOLHDL 2.8   Thyroid Function Tests No results for input(s): "TSH", "T4TOTAL", "T3FREE", "THYROIDAB" in the last 72 hours.  Invalid input(s): "FREET3" _____________  CT ABDOMEN PELVIS W CONTRAST  Result Date: 10/27/2022 CLINICAL DATA:  Abdominal pain and vomiting. EXAM: CT ABDOMEN AND PELVIS WITH CONTRAST TECHNIQUE: Multidetector CT imaging of the abdomen and pelvis was performed using the standard protocol following  bolus administration of intravenous contrast. RADIATION DOSE REDUCTION: This exam was performed according to the departmental dose-optimization program which includes automated exposure control, adjustment of the mA and/or kV according to Wendy Lopez size and/or use of iterative reconstruction technique. CONTRAST:  75mL OMNIPAQUE IOHEXOL 350 MG/ML SOLN COMPARISON:  None Available. FINDINGS: Lower chest: The heart is upper normal in size. Coronary artery calcifications. Subsegmental atelectasis within the lower lobes. Hepatobiliary: 18 mm cyst in the right hepatic lobe series 3, image 33. Additional low-density lesion in the right lobe likely represents a hemangioma but is too small to characterize. There is minimal periportal edema. No biliary dilatation. Gallbladder is partially distended, suspect right carious excretion of IV contrast. No calcified gallstone. Pancreas: Unremarkable. No pancreatic ductal dilatation or surrounding inflammatory changes. Spleen: Normal in size without focal abnormality. Adrenals/Urinary Tract: No adrenal  nodule. No hydronephrosis or perinephric edema. Homogeneous renal enhancement with symmetric excretion on delayed phase imaging. No renal calculi. Small cyst in the lateral left kidney, needs no further imaging. Urinary bladder is nondistended. Stomach/Bowel: Decompressed stomach. No bowel obstruction or inflammation. Normal appendix. Small to moderate volume of stool in the colon. Vascular/Lymphatic: Aortic atherosclerosis without aneurysm. The portal vein is patent. No acute vascular findings. No enlarged lymph nodes in the abdomen or pelvis. Reproductive: Uterus and bilateral adnexa are unremarkable. Other: No free air or ascites. Tiny fat containing umbilical and supraumbilical ventral abdominal wall hernia Musculoskeletal: Interbody spacers L2-L3 through L5-S1. Tarlov cysts in the sacrum, incidental. No acute findings. IMPRESSION: 1. No acute abnormality in the abdomen/pelvis. 2.  Minimal periportal edema, nonspecific, may be related to hydration status. 3. Tiny fat containing umbilical and supraumbilical ventral abdominal wall hernias. Aortic Atherosclerosis (ICD10-I70.0). Electronically Signed   By: Narda Rutherford M.D.   On: 10/27/2022 17:45   CARDIAC CATHETERIZATION  Result Date: 10/26/2022   Non-stenotic Mid RCA lesion was previously treated. 1.  Widely patent right coronary artery stent. 2.  LVEDP of 14 mmHg. Recommendation: Medical therapy.   DG Chest Port 1 View  Result Date: 10/26/2022 CLINICAL DATA:  Chest pain, suspected STEMI, nausea. EXAM: PORTABLE CHEST 1 VIEW COMPARISON:  Partial chest CT for coronary artery calcium scoring 06/14/2022, PA and lateral chest 03/23/2011. FINDINGS: There is mild cardiomegaly without evidence of CHF. There is calcification in the aortic arch, with a normal mediastinal configuration. There is scattered linear scarring or atelectasis in the lung bases. No substantial pleural effusion. The lungs are otherwise clear. Osteopenia. Degenerative change thoracic spine. Multiple overlying monitor wires. IMPRESSION: 1. Mild cardiomegaly without evidence of CHF. 2. Aortic atherosclerosis. 3. Linear scarring or atelectasis in the lung bases. Electronically Signed   By: Almira Bar M.D.   On: 10/26/2022 02:31   ECHOCARDIOGRAM COMPLETE  Result Date: 10/24/2022    ECHOCARDIOGRAM REPORT   Wendy Lopez Name:   AMIYA Lopez Date of Exam: 10/24/2022 Medical Rec #:  [id number]         Height:       62.0 in Accession #:    [id number]        Weight:       146.2 lb Date of Birth:  [birth year]          BSA:          1.673 m Wendy Lopez Age:    61 years          BP:           119/78 mmHg Wendy Lopez Gender: F                 HR:           66 bpm. Exam Location:  Inpatient Procedure: 2D Echo, Cardiac Doppler and Color Doppler Indications:    CAD of native vessel  History:        Wendy Lopez has prior history of Echocardiogram examinations, most                 recent  02/26/2018. History of breast cancer,                 Signs/Symptoms:Chest Pain; Risk Factors:Hypertension and Current                 Smoker.  Sonographer:    Delcie Roch RDCS Referring Phys: [id number] CHRISTOPHER D MCALHANY IMPRESSIONS  1. Left ventricular ejection fraction, by estimation, is  50%. The left ventricle has mildly decreased function. The left ventricle demonstrates regional wall motion abnormalities with basal to mid inferoseptal and basal inferior akinesis. Left ventricular diastolic parameters are consistent with Grade I diastolic dysfunction (impaired relaxation).  2. Right ventricular systolic function is normal. The right ventricular size is normal. Tricuspid regurgitation signal is inadequate for assessing PA pressure.  3. The mitral valve is normal in structure. Trivial mitral valve regurgitation. No evidence of mitral stenosis.  4. The aortic valve is tricuspid. Aortic valve regurgitation is not visualized. No aortic stenosis is present.  5. The inferior vena cava is normal in size with <50% respiratory variability, suggesting right atrial pressure of 8 mmHg. FINDINGS  Left Ventricle: Left ventricular ejection fraction, by estimation, is 50%. The left ventricle has mildly decreased function. The left ventricle demonstrates regional wall motion abnormalities. The left ventricular internal cavity size was normal in size. There is no left ventricular hypertrophy. Left ventricular diastolic parameters are consistent with Grade I diastolic dysfunction (impaired relaxation). Right Ventricle: The right ventricular size is normal. No increase in right ventricular wall thickness. Right ventricular systolic function is normal. Tricuspid regurgitation signal is inadequate for assessing PA pressure. Left Atrium: Left atrial size was normal in size. Right Atrium: Right atrial size was normal in size. Pericardium: Trivial pericardial effusion is present. Mitral Valve: The mitral valve is normal in  structure. Trivial mitral valve regurgitation. No evidence of mitral valve stenosis. Tricuspid Valve: The tricuspid valve is normal in structure. Tricuspid valve regurgitation is not demonstrated. Aortic Valve: The aortic valve is tricuspid. Aortic valve regurgitation is not visualized. No aortic stenosis is present. Pulmonic Valve: The pulmonic valve was normal in structure. Pulmonic valve regurgitation is trivial. Aorta: The aortic root is normal in size and structure. Venous: The inferior vena cava is normal in size with less than 50% respiratory variability, suggesting right atrial pressure of 8 mmHg. IAS/Shunts: No atrial level shunt detected by color flow Doppler.  LEFT VENTRICLE PLAX 2D LVIDd:         4.00 cm     Diastology LV PW:         0.80 cm     LV e' medial:    8.08 cm/s LV IVS:        0.70 cm     LV E/e' medial:  9.5 LVOT diam:     1.70 cm     LV e' lateral:   9.17 cm/s LV SV:         50          LV E/e' lateral: 8.3 LV SV Index:   30 LVOT Area:     2.27 cm  LV Volumes (MOD) LV vol d, MOD A2C: 39.7 ml LV vol s, MOD A2C: 22.7 ml LV SV MOD A2C:     17.0 ml RIGHT VENTRICLE             IVC RV S prime:     16.20 cm/s  IVC diam: 1.60 cm TAPSE (M-mode): 2.1 cm LEFT ATRIUM             Index LA Vol (A2C):   23.8 ml 14.22 ml/m LA Vol (A4C):   33.2 ml 19.84 ml/m LA Biplane Vol: 29.7 ml 17.75 ml/m  AORTIC VALVE             PULMONIC VALVE LVOT Vmax:   113.00 cm/s PR End Diast Vel: 2.31 msec LVOT Vmean:  68.800 cm/s LVOT VTI:    0.221  m  AORTA Ao Root diam: 2.60 cm Ao Asc diam:  2.90 cm MITRAL VALVE MV Area (PHT): 3.37 cm    SHUNTS MV Decel Time: 225 msec    Systemic VTI:  0.22 m MV E velocity: 76.40 cm/s  Systemic Diam: 1.70 cm MV A velocity: 75.60 cm/s MV E/A ratio:  1.01 Dalton McleanMD Electronically signed by Wilfred Lacy Signature Date/Time: 10/24/2022/11:39:00 AM    Final    CARDIAC CATHETERIZATION  Result Date: 10/23/2022   Mid RCA lesion is 99% stenosed.   A drug-eluting stent was successfully  placed using a SYNERGY XD [id number].   Post intervention, there is a 0% residual stenosis.   There is mild left ventricular systolic dysfunction.   LV end diastolic pressure is severely elevated.   The left ventricular ejection fraction is 45-50% by visual estimate. Acute inferior ST elevation MI secondary to thrombotic occlusion of the mid RCA Successful PTCA/DES x 1 mid RCA No disease in the LAD or Circumflex LVEDP=33mmHg Recommendations: Will admit to the ICU. Aggrastat infusion for two hours. DAPT with ASA and Brilinta for one year. High intensity statin. Beta blocker as BP tolerates. Echo tomorrow. Lasix 40 mg IV given as Wendy Lopez was leaving the cath lab.   Disposition   Pt is being discharged home today in good condition.  Follow-up Plans & Appointments     Discharge Instructions     AMB referral to Phase II Cardiac Rehabilitation   Complete by: As directed    Diagnosis: Coronary Stents   After initial evaluation and assessments completed: Virtual Based Care may be provided alone or in conjunction with Phase 2 Cardiac Rehab based on Wendy Lopez barriers.: Yes   Intensive Cardiac Rehabilitation (ICR) [facility] location only OR Traditional Cardiac Rehabilitation (TCR) *If criteria for ICR are not met will enroll in TCR [facility] only): Yes   Call MD for:  extreme fatigue   Complete by: As directed    Call MD for:  persistant dizziness or light-headedness   Complete by: As directed    Call MD for:  persistant nausea and vomiting   Complete by: As directed    Diet - low sodium heart healthy   Complete by: As directed    Increase activity slowly   Complete by: As directed         Discharge Medications   Allergies as of 10/28/2022       Reactions   Carbamazepine Anaphylaxis   Lamictal [lamotrigine] Anaphylaxis   SOB, Lamotrigine only   Omeprazole Anaphylaxis   Other    STATES HAS SHORTNESS OF BREATH WITH GENERIC DRUGS     ALSO RASH AND ITCHING   Pantoprazole Anaphylaxis   Prednisone Other  (See Comments), Rash, Shortness Of Breath   Prescribing MD told Wendy Lopez he thinks dose (dose pack) was "just too high.", 03/09/11   STATES HAS TAKEN SMALLER DOSE AND STILL HAD RASH AND JITTERINESS, Prescribing MD told Wendy Lopez he thinks dose (dose pack) was "just too high.", 03/09/11   STATES HAS TAKEN SMALLER DOSE AND STILL HAD RASH AND JITTERINESS   Proton Pump Inhibitors Anaphylaxis   Pseudoephedrine Hcl Er Other (See Comments)   SEIZURES.   Valium Other (See Comments)   Hallucinations, spasticity, hyper-relfexive   Amlodipine Rash   Face swell   Oxycodone Nausea And Vomiting, Rash, Other (See Comments)   Ibuprofen Swelling   Percocet [oxycodone-acetaminophen] Nausea And Vomiting   [employer] Er] Other (See Comments)   Made headache worse, dizziness, difficulty breathing, rash  Betadine [povidone Iodine] Rash   Latex Dermatitis, Rash   Shellfish Allergy Swelling        Medication List     TAKE these medications    aspirin EC 81 MG tablet Take 1 tablet (81 mg total) by mouth daily. Swallow whole.   atorvastatin 80 MG tablet Commonly known as: LIPITOR Take 1 tablet (80 mg total) by mouth daily.   Brilinta 90 MG Tabs tablet Generic drug: ticagrelor Take 1 tablet (90 mg total) by mouth 2 (two) times daily.   EPINEPHrine 0.3 mg/0.3 mL Soaj injection Commonly known as: EPI-PEN Inject 0.3 mg into the muscle as needed for anaphylaxis.   gabapentin 100 MG capsule Commonly known as: Neurontin Take 1 capsule (100 mg total) by mouth at bedtime. What changed:  when to take this reasons to take this   levETIRAcetam 750 MG tablet Commonly known as: KEPPRA Take 1 tablet (750 mg total) by mouth 2 (two) times daily.   losartan 100 MG tablet Commonly known as: COZAAR Take 1 tablet (100 mg total) by mouth daily. Start taking on: October 29, 2022 What changed:  medication strength how much to take   metoprolol succinate 100 MG 24 hr tablet Commonly known as:  TOPROL-XL Take 1 tablet (100 mg total) by mouth daily. Take with or immediately following a meal. Start taking on: October 29, 2022 What changed:  medication strength how much to take   nicotine 14 mg/24hr patch Commonly known as: NICODERM CQ - dosed in mg/24 hours Place 1 patch (14 mg total) onto the skin daily.   nitroGLYCERIN 0.4 MG SL tablet Commonly known as: Nitrostat Place 1 tablet (0.4 mg total) under the tongue every 5 (five) minutes as needed.   ondansetron 4 MG disintegrating tablet Commonly known as: ZOFRAN-ODT Take 1 tablet (4 mg total) by mouth every 8 (eight) hours as needed for nausea or vomiting.   sodium chloride 0.65 % nasal spray Commonly known as: OCEAN Place 1-2 sprays into the nose as needed for congestion.   Stiolto Respimat 2.5-2.5 MCG/ACT Aers Generic drug: Tiotropium Bromide-Olodaterol Inhale 2 puffs into the lungs daily. What changed:  when to take this reasons to take this   sucralfate 1 g tablet Commonly known as: CARAFATE Take 1 tablet (1 g total) by mouth 4 (four) times daily. What changed: when to take this   Vitamin B-12 3000 MCG/ML Liqd Inject 3,000 mcg as directed every 30 (thirty) days.   ZyrTEC Allergy 10 MG tablet Generic drug: cetirizine Take 10 mg by mouth at bedtime as needed for allergies.           Outstanding Labs/Studies     Duration of Discharge Encounter   Greater than 30 minutes including physician time.  Signed, Perlie Gold, PA-C 10/28/2022, 10:45 AM  Agree with note by Perlie Gold PA-C  Wendy Lopez had recent inferior STEMI underwent PCI and stenting.  She was discharged home 2 days ago and came back the same day with recurrent chest pain, nausea and vomiting.  We look By Dr.Thukkani revealed widely patent RCA.  Her major issues have been with nausea vomiting.  She has had a GI evaluation recently with endoscopy and colonoscopy.  She was seen by GI during his hospitalization who did not feel further workup was  necessary.  She has stopped smoking.  She has had no recurrent chest pain.  She is stable for discharge home today with close outpatient follow-up.  Runell Gess, M.D., FACP, [facility], [place], [facility] [address] [place], [place]  [address]  [phone] 10/28/2022 11:31 AM

## 2022-10-28 NOTE — Progress Notes (Signed)
Binnie Rail Lutes 9:29 AM  Subjective: Patient seen and examined and case discussed with her son and my partner Dr. Bosie Clos and her hospital computer chart and her office computer chart reviewed including previous workup and she has had chronic problems which probably stem from inability to take acid blockers or pump inhibitors due to side effects and we did discuss Carafate and answered all of her questions  Objective: Vital signs stable afebrile no acute distress abdomen is soft nontender  Assessment: Multiple medical complaints and problems including probably biliary reflux and nausea  Plan: Try half a Carafate 4 times a day and there is a new acid blocker being released from then a pump inhibitor and hopefully when readily available she can try that will ensure insurance will be a problem we do not have samples yet and my partner Dr. Levora Angel happy to see back in the office and her primary care doctor knows her well and has been dealing with these issues for a while and please let me know if I can be of any further assistance with this hospital stay  [facility]  office [phone] After 5PM or if no answer call [phone]

## 2022-10-28 NOTE — Plan of Care (Signed)
  Problem: Education: Goal: Understanding of CV disease, CV risk reduction, and recovery process will improve Outcome: Adequate for Discharge Goal: Individualized Educational Video(s) Outcome: Adequate for Discharge   Problem: Activity: Goal: Ability to return to baseline activity level will improve Outcome: Adequate for Discharge   Problem: Cardiovascular: Goal: Ability to achieve and maintain adequate cardiovascular perfusion will improve Outcome: Adequate for Discharge Goal: Vascular access site(s) Level 0-1 will be maintained Outcome: Adequate for Discharge   Problem: Health Behavior/Discharge Planning: Goal: Ability to safely manage health-related needs after discharge will improve Outcome: Adequate for Discharge   Problem: Clinical Measurements: Goal: Ability to maintain clinical measurements within normal limits will improve Outcome: Adequate for Discharge Goal: Will remain free from infection Outcome: Adequate for Discharge Goal: Diagnostic test results will improve Outcome: Adequate for Discharge Goal: Respiratory complications will improve Outcome: Adequate for Discharge Goal: Cardiovascular complication will be avoided Outcome: Adequate for Discharge   Problem: Health Behavior/Discharge Planning: Goal: Ability to manage health-related needs will improve Outcome: Adequate for Discharge   Problem: Activity: Goal: Risk for activity intolerance will decrease Outcome: Adequate for Discharge   Problem: Nutrition: Goal: Adequate nutrition will be maintained Outcome: Adequate for Discharge   Problem: Pain Managment: Goal: General experience of comfort will improve Outcome: Adequate for Discharge

## 2022-10-28 NOTE — Progress Notes (Addendum)
Patient Name: Wendy Lopez Date of Encounter: 10/28/2022 [facility] Cardiologist: Nicki Guadalajara, MD   Interval Summary  .    Patient continues with frequent nausea and dry heaves/emesis. She did better overnight but had recurrence this morning with ambulation. She loosely correlates symptoms with ambulation.   Vital Signs .    Vitals:   10/27/22 1734 10/27/22 2000 10/27/22 2331 10/28/22 0749  BP: (!) 169/91  (!) 169/103 (!) 163/91  Pulse:    69  Resp: 13  17 19   Temp: 98.1 F (36.7 C)  98.1 F (36.7 C) 98.4 F (36.9 C)  TempSrc: Oral  Oral Oral  SpO2: 95% 96% 96% 96%  Weight:      Height:       No intake or output data in the 24 hours ending 10/28/22 0804    10/27/2022    8:30 AM 10/23/2022   12:22 PM 10/07/2022    1:03 PM  Last 3 Weights  Weight (lbs) 146 lb 2.6 oz 146 lb 3.7 oz 141 lb  Weight (kg) 66.3 kg 66.33 kg 63.957 kg      Telemetry/ECG    Sinus rhythm - Personally Reviewed  Physical Exam .   GEN: No acute distress.   Neck: No JVD Cardiac: RRR, no murmurs, rubs, or gallops.  Respiratory: Clear to auscultation bilaterally. GI: Soft, epigastric tenderness MS: No edema  Assessment & Plan .     S/P Inferior STEMI  Patient POD 5 from inferolateral STEMI with subtotal occlusion of mid to distal dominant RCA. She had successful PCI with Dr. Clifton James and was subsequently discharged. Patient returned to the hospital on 7/10 with nausea and recurrent chest pain. ECG continued to show ST elevation in inferolateral pattern and she was taken back to the cath lab for re-look. Found with unchanged coronary anatomy and widely patent RCA. She does have 50-60% proximal RCA stenosis.   Continue DAPT with ASA/Brilinta Continue Metoprolol Succinate 100mg   GI upset  Patient with severe nausea and intermittent emesis this admission (but also occurring over recent months prompting GI evaluation). She is s/p EGD/colonoscopy on 10/13/22 which showed gastritis and  small hiatal hernia. No acute findings. Since LHC, patient with worsening nausea/vomiting and heartburn.  Patient's GI group, [facility] has seen patient. Greatly appreciate their assistance. CT abd/pelvis w/ contrast on 7/11 did not reveal any acute abnormality. Although no elevation of transaminases yesterday, will attempt to add on lipase lab this morning given that pain appears to be primarily epigastric.  LV dysfunction  Patient with inferobasal hypokinesis with LVEF 45-50% in setting of inferolateral STEMI.  Given acute GI upset, will continue to defer initiation of SGLT2 and Spironolactone to outpatient follow up.  Continue Losartan, increase to 100mg . As above, with GI upset, will not change medications while inpatient but need to consider transition to [place] with outpatient follow up.  Continue Metoprolol Succinate 100mg .   Hyperlipidemia  Continue Atorvastatin 80mg .  Hypertension  Patient remains hypertensive this admission. Increase Losartan to 100mg . Continue Metoprolol Succinate 100mg . May need to consider switch to Coreg if BP remains high.   Tobacco abuse  Cessation discussed  Hx Seizures  Continue home dose Keppra.   For questions or updates, please contact [facility] Please consult [email or web address] for contact info under        Signed, Perlie Gold, PA-C    Agree with note by Perlie Gold PA-C  Patient was readmitted the same day after discharge with chest pain nausea and  vomiting.  She was taken back to the Cath Lab by Dr.Thukkani revealing a patent RCA stent.  She has mild LV dysfunction.  Her major issues have been nausea and vomiting.  She has been evaluated by [facility] and has had upper and lower endoscopy in the recent past.  No further invasive workup is planned at this time.  She is feeling somewhat better this morning.  Exam is benign.  She is on appropriate medications for GDMT and DAPT.  I feel comfortable discharging her today with close  outpatient follow-up.  Will try a nicotine patch in case some of her nausea is "nicotine withdrawal".  Runell Gess, M.D., FACP, [facility], [name] [address] [place], [place]  [address]  [phone] 10/28/2022 9:36 AM

## 2022-11-05 NOTE — Progress Notes (Deleted)
Cardiology Clinic Note   Date: 11/05/2022 ID: Wendy Lopez, DOB 09-May-1960, MRN [id number]  Primary Cardiologist:  Nicki Guadalajara, MD  Patient Profile    Wendy Lopez is a 62 y.o. female who presents to the clinic today for ***    Past medical history significant for: CAD. LHC 10/23/2022 (STEMI): Mid RCA 99% thrombotic lesion.  Mild LV systolic dysfunction.  LVEDP severely elevated.  EF 45 to 50%.  PCI with DES to mid RCA. LHC 10/26/2022: Widely patent RCA stent. Palpitations/SVT. 14-day ZIO 02/19/2020: Predominant rhythm was sinus rhythm with average rate 81 bpm.  6 beat burst of SVT at 176 bpm.  Total of 21 brief episodes of SVT with the fastest lasting 6 beats and longest lasting 15 beats.  Rare PACs and PVCs.  No A-fib, pauses, heart block. Hypertension. Hyperlipidemia. Lipid panel 10/26/2022: LDL 113, HDL 70, TG 71, total 197. Seizure disorder. Breast cancer on the right. Tobacco abuse.     History of Present Illness    Wendy Lopez is a longtime patient of cardiology.  She has a history of abnormal EKG.  Cardiac catheterization in 2009 revealed normal coronary arteries.  Cardiopulmonary testing in 2013 showed good chronotropic response without ischemic changes, reduced functional status with peak O2 at 69% of predicted.  She has a history of mild pulmonary hypertension.  Echo in 2016 was unchanged from prior and showed normal LV function with normal pulmonary pressure.  Nuclear stress test in 2019 in the setting of intermittent chest tightness showed normal perfusion and was a low risk study.  She has a history of palpitations for which she underwent cardiac monitoring in 2020 which showed no significant arrhythmia.  Repeat cardiac monitoring in February 2021 showed brief episodes of SVT.  Palpitations are managed with metoprolol.  She continues to be followed by Dr. Tresa Endo for the above outlined history.  Patient was last seen in the office by Dr. Tresa Endo on 10/07/2022 for  routine follow-up.  At that time she complained of right-sided abdominal pain which was felt to be possibly be gallbladder disease.  She was evaluated by GI and scheduled to undergo EGD/colonoscopy.  She reported myalgias with rosuvastatin and PCP suggested trying atorvastatin but she did not implement this.  Patient presented to the ED via EMS on 10/23/2022 with complaints of substernal chest pain radiating to shoulders.  EKG showed inferior elevations with reciprocal changes and code STEMI was activated.  Patient was taken emergently to the Cath Lab and underwent PCI with DES to mid RCA.  He was discharged on 10/25/2022.  Unfortunately, she returned to the ED in the early morning hours of 10/26/2022 with chest tightness and associated nausea and vomiting.  EKG showed ST elevation inferiorly and laterally and code STEMI was called.  She was taken to the Cath Lab which showed a patent RCA stent.  She continued to have nausea and vomiting.  She was discharged on 10/28/2022.  Today, patient ***  CAD.  S/p STEMI 10/23/2022 undergoing PCI with DES to mid RCA.  Unfortunately, she developed chest pain with nausea and vomiting after discharge and was readmitted on 10/26/2022.  Relook catheterization showed patent RCA stent.  Patient*** Continue aspirin, Brilinta, atorvastatin, metoprolol, as needed SL NTG. Palpitations/SVT.  14-day ZIO November 2021 showed brief episodes of SVT.  Patient*** Continue metoprolol. Hypertension: BP today *** Patient denies headaches, dizziness or vision changes. Continue metoprolol, losartan. Hyperlipidemia.  LDL July 2024 113, not at goal.  Patient with history of  intolerance to rosuvastatin.  She was started on atorvastatin during hospitalization for STEMI.***   ROS: All other systems reviewed and are otherwise negative except as noted in History of Present Illness.  Studies Reviewed       ***  Risk Assessment/Calculations    {Does this patient have ATRIAL  FIBRILLATION?:[id number]} No BP recorded.  {Refresh Note OR Click here to enter BP  :1}***        Physical Exam    VS:  LMP 11/03/1993  , BMI There is no height or weight on file to calculate BMI.  GEN: Well nourished, well developed, in no acute distress. Neck: No JVD or carotid bruits. Cardiac: *** RRR. No murmurs. No rubs or gallops.   Respiratory:  Respirations regular and unlabored. Clear to auscultation without rales, wheezing or rhonchi. GI: Soft, nontender, nondistended. Extremities: Radials/DP/PT 2+ and equal bilaterally. No clubbing or cyanosis. No edema ***  Skin: Warm and dry, no rash. Neuro: Strength intact.  Assessment & Plan   ***  Disposition: ***  {The patient has an active order for outpatient cardiac rehabilitation.   Please indicate if the patient is ready to start. Do NOT delete this.  It will auto delete.  Refresh note, then sign.              Click here to document readiness and see contraindications.  :1}  Cardiac Rehabilitation Eligibility Assessment      {Are you ordering a CV Procedure (e.g. stress test, cath, DCCV, TEE, etc)?   Press F2        :[id number]}   Signed, Armina Galloway. , DNP, NP-C

## 2022-11-07 ENCOUNTER — Ambulatory Visit: Payer: Medicare Other | Admitting: Student

## 2022-11-07 NOTE — Progress Notes (Unsigned)
Cardiology Clinic Note   Date: 11/09/2022 ID: KATALEA UCCI, DOB Jul 11, 1960, MRN [id number]  Primary Cardiologist:  Nicki Guadalajara, MD  Patient Profile    Wendy Lopez is a 62 y.o. female who presents to the clinic today for hospital follow up.     Past medical history significant for: CAD. LHC 10/23/2022 (STEMI): Mid RCA 99% thrombotic lesion.  Mild LV systolic dysfunction.  LVEDP severely elevated.  EF 45 to 50%.  PCI with DES to mid RCA. LHC 10/26/2022: Widely patent RCA stent. Palpitations/SVT. 14-day ZIO 02/19/2020: Predominant rhythm was sinus rhythm with average rate 81 bpm.  6 beat burst of SVT at 176 bpm.  Total of 21 brief episodes of SVT with the fastest lasting 6 beats and longest lasting 15 beats.  Rare PACs and PVCs.  No A-fib, pauses, heart block. Hypertension. Hyperlipidemia. Lipid panel 10/26/2022: LDL 113, HDL 70, TG 71, total 197. Seizure disorder. Breast cancer on the right. Tobacco abuse.     History of Present Illness    Wendy Lopez is a longtime patient of cardiology.  She has a history of abnormal EKG.  Cardiac catheterization in 2009 revealed normal coronary arteries.  Cardiopulmonary testing in 2013 showed good chronotropic response without ischemic changes, reduced functional status with peak O2 at 69% of predicted.  She has a history of mild pulmonary hypertension.  Echo in 2016 was unchanged from prior and showed normal LV function with normal pulmonary pressure.  Nuclear stress test in 2019 in the setting of intermittent chest tightness showed normal perfusion and was a low risk study.  She has a history of palpitations for which she underwent cardiac monitoring in 2020 which showed no significant arrhythmia.  Repeat cardiac monitoring in February 2021 showed brief episodes of SVT.  Palpitations are managed with metoprolol.  She continues to be followed by Dr. Tresa Endo for the above outlined history.  Patient was last seen in the office by Dr. Tresa Endo on  10/07/2022 for routine follow-up.  At that time she complained of right-sided abdominal pain which was felt to be possibly be gallbladder disease.  She was evaluated by GI and scheduled to undergo EGD/colonoscopy.  She reported myalgias with rosuvastatin and PCP suggested trying atorvastatin but she did not implement this.  Patient presented to the ED via EMS on 10/23/2022 with complaints of substernal chest pain radiating to shoulders.  EKG showed inferior elevations with reciprocal changes and code STEMI was activated.  Patient was taken emergently to the Cath Lab and underwent PCI with DES to mid RCA.  He was discharged on 10/25/2022.  Unfortunately, she returned to the ED in the early morning hours of 10/26/2022 with chest tightness and associated nausea and vomiting.  EKG showed ST elevation inferiorly and laterally and code STEMI was called.  She was taken to the Cath Lab which showed a patent RCA stent.  She continued to have nausea and vomiting.  She was discharged on 10/28/2022.  Today, patient reports continued central chest pain with associated shortness of breath that feels the same as it did when she went to the hospital. It is less intense and not lasting as long. She has been trying to walk for the last three days and feels the pain come on after she stops. She has not tried NTG for it. She denies pain with deep breathing, laying back or leaning forward. She cut back on losartan as her SBP was in the 90s. She is requsting to decrease dose of  atorvastatin, as it was causing nausea and vomiting. She has not taken it since last Friday and nausea/vomiting resolved.  She denies lower extremity edema. She has a little bit of tenderness at cath site but no pain up her arm.  She has not been smoking since hospitalization. She is very interested in cardiac rehab. Discussed holding off for now until she is more stable. Will re-evaluate in 3 weeks when she returns.     ROS: All other systems reviewed and are  otherwise negative except as noted in History of Present Illness.  Studies Reviewed    EKG Interpretation Date/Time:  Wednesday November 09 2022 14:57:40 EDT Ventricular Rate:  75 PR Interval:  138 QRS Duration:  66 QT Interval:  400 QTC Calculation: 446 R Axis:   47  Text Interpretation: Normal sinus rhythm Inferior infarct (cited on or before 27-Oct-2022) T wave abnormality, consider anterolateral ischemia When compared with ECG of 27-Oct-2022 12:21, Serial changes of evolving Inferior infarct Present Confirmed by Carlos Levering [phone]) on 11/09/2022 3:24:02 PM       Physical Exam    VS:  BP (!) 142/94 (BP Location: Left Arm, Patient Position: Sitting, Cuff Size: Normal)   Pulse 75   Ht 5\' 2"  (1.575 m)   Wt 141 lb 9.6 oz (64.2 kg)   LMP 11/03/1993   SpO2 97%   BMI 25.90 kg/m  , BMI Body mass index is 25.9 kg/m.  GEN: Well nourished, well developed, in no acute distress. Neck: No JVD or carotid bruits. Cardiac:  RRR. No murmurs. No rubs or gallops.   Respiratory:  Respirations regular and unlabored. Clear to auscultation without rales, wheezing or rhonchi. GI: Soft, nontender, nondistended. Extremities: Radials/DP/PT 2+ and equal bilaterally. No clubbing or cyanosis. No edema. Right wrist with healing ecchymosis and mild tenderness to palpation over ecchymosis. No erythema or swelling.   Skin: Warm and dry, no rash. Neuro: Strength intact.  Assessment & Plan    CAD.  S/p STEMI 10/23/2022 undergoing PCI with DES to mid RCA.  Unfortunately, she developed chest pain with nausea and vomiting after discharge and was readmitted on 10/26/2022.  Relook catheterization showed patent RCA stent.  Patient reports continued central chest pain with associated shortness of breath that feels the same as pain that brought her to the hospital. She has been trying to walk for the last three days and pain will come on after she stops. She has not tried to take NTG for her symptoms. Will add  isosorbide 15 mg daily. She is prone to headaches so she is instructed to take dose at bedtime. Continue aspirin, Brilinta, atorvastatin, metoprolol, as needed SL NTG. Hypertension: BP today 142/94 on intake and 130/82 on my recheck. She decreased her dose of Losartan to 50 mg as her SBP was in the 90s. Patient denies headaches, dizziness or vision changes. Continue metoprolol, losartan. Hyperlipidemia.  LDL July 2024 113, not at goal.  Patient with history of intolerance to rosuvastatin.  She was started on atorvastatin during hospitalization for STEMI but the high dose was causing nausea/vomiting. She stopped it several days ago and nausea/vomiting resolved. She is instructed to try atorvastatin at 40 mg to see if she tolerates it better. We will try to titrate up based on her tolerance.  Tobacco abuse. Patient has not smoked since hospitalization. She is congratulated and encouraged to continue complete cessation.   Disposition: Continue Losartan at 50 mg daily. Decrease atorvastatin to 40 mg daily. Start Isosorbide 15 mg daily.  Return in 3 weeks or sooner as needed.     Cardiac Rehabilitation Eligibility Assessment  The patient is NOT ready to start cardiac rehabilitation due to: Other (Continued chest pain. Will re-evaluate in 3 weeks.)        Signed, Etta Grandchild. Lucee Brissett, DNP, NP-C

## 2022-11-09 ENCOUNTER — Ambulatory Visit: Payer: Medicare Other | Attending: Student | Admitting: Student

## 2022-11-09 ENCOUNTER — Encounter: Payer: Self-pay | Admitting: Student

## 2022-11-09 VITALS — BP 130/82 | HR 75 | Ht 62.0 in | Wt 141.6 lb

## 2022-11-09 DIAGNOSIS — E785 Hyperlipidemia, unspecified: Secondary | ICD-10-CM | POA: Diagnosis not present

## 2022-11-09 DIAGNOSIS — Z720 Tobacco use: Secondary | ICD-10-CM | POA: Diagnosis not present

## 2022-11-09 DIAGNOSIS — I10 Essential (primary) hypertension: Secondary | ICD-10-CM | POA: Insufficient documentation

## 2022-11-09 DIAGNOSIS — I25118 Atherosclerotic heart disease of native coronary artery with other forms of angina pectoris: Secondary | ICD-10-CM | POA: Diagnosis not present

## 2022-11-09 MED ORDER — ISOSORBIDE MONONITRATE ER 30 MG PO TB24: 15.0000 mg | ORAL_TABLET | Freq: Every day | ORAL | 1 refills | Status: DC

## 2022-11-09 NOTE — Patient Instructions (Signed)
Medication Instructions:  Start Imdur 30 mg ( Take 1/2 Tablet Daily 15 mg ). *If you need a refill on your cardiac medications before your next appointment, please call your pharmacy*   Lab Work: No labs If you have labs (blood work) drawn today and your tests are completely normal, you will receive your results only by: MyChart Message (if you have MyChart) OR A paper copy in the mail If you have any lab test that is abnormal or we need to change your treatment, we will call you to review the results.   Testing/Procedures: No Testing   Follow-Up: At [facility], you and your health needs are our priority.  As part of our continuing mission to provide you with exceptional heart care, we have created designated Provider Care Teams.  These Care Teams include your primary Cardiologist (physician) and Advanced Practice Providers (APPs -  Physician Assistants and Nurse Practitioners) who all work together to provide you with the care you need, when you need it.  We recommend signing up for the patient portal called "MyChart".  Sign up information is provided on this After Visit Summary.  MyChart is used to connect with patients for Virtual Visits (Telemedicine).  Patients are able to view lab/test results, encounter notes, upcoming appointments, etc.  Non-urgent messages can be sent to your provider as well.   To learn more about what you can do with MyChart, go to [email or web address].    Your next appointment:   3 week(s)  Provider:   Carlos Levering, NP

## 2022-11-12 ENCOUNTER — Other Ambulatory Visit: Payer: Self-pay | Admitting: Cardiovascular Disease

## 2022-11-23 ENCOUNTER — Other Ambulatory Visit (HOSPITAL_COMMUNITY): Payer: Self-pay

## 2022-11-28 NOTE — Progress Notes (Unsigned)
Cardiology Clinic Note   Date: 11/30/2022 ID: Wendy Lopez, DOB [birth year], MRN [id number]  Primary Cardiologist:  Nicki Guadalajara, MD  Patient Profile    Wendy Lopez is a 62 y.o. female who presents to the clinic today for follow up.     Past medical history significant for: CAD. LHC 10/23/2022 (STEMI): Mid RCA 99% thrombotic lesion.  Mild LV systolic dysfunction.  LVEDP severely elevated.  EF 45 to 50%.  PCI with DES to mid RCA. LHC 10/26/2022: Widely patent RCA stent. Palpitations/SVT. 14-day ZIO 02/19/2020: Predominant rhythm was sinus rhythm with average rate 81 bpm.  6 beat burst of SVT at 176 bpm.  Total of 21 brief episodes of SVT with the fastest lasting 6 beats and longest lasting 15 beats.  Rare PACs and PVCs.  No A-fib, pauses, heart block. Hypertension. Hyperlipidemia. Lipid panel 10/26/2022: LDL 113, HDL 70, TG 71, total 197. Seizure disorder. Breast cancer on the right. Tobacco abuse.      History of Present Illness    Wendy Lopez is a longtime patient of cardiology. She has a history of abnormal EKG. Cardiac catheterization in 2009 revealed normal coronary arteries. Cardiopulmonary testing in 2013 showed good chronotropic response without ischemic changes, reduced functional status with peak O2 at 69% of predicted. She has a history of mild pulmonary hypertension. Echo in 2016 was unchanged from prior and showed normal LV function with normal pulmonary pressure. Nuclear stress test in 2019 in the setting of intermittent chest tightness showed normal perfusion and was a low risk study. She has a history of palpitations for which she underwent cardiac monitoring in 2020 which showed no significant arrhythmia. Repeat cardiac monitoring in February 2021 showed brief episodes of SVT. Palpitations are managed with metoprolol. She continues to be followed by Dr. Tresa Endo for the above outlined history.   Patient presented to the ED via EMS on 10/23/2022 with complaints of  substernal chest pain radiating to shoulders. EKG showed inferior elevations with reciprocal changes and code STEMI was activated. Patient was taken emergently to the Cath Lab and underwent PCI with DES to mid RCA. He was discharged on 10/25/2022. Unfortunately, she returned to the ED in the early morning hours of 10/26/2022 with chest tightness and associated nausea and vomiting. EKG showed ST elevation inferiorly and laterally and code STEMI was called. She was taken to the Cath Lab which showed a patent RCA stent. She continued to have nausea and vomiting. She was discharged on 10/28/2022.   Patient was last seen in the office by me on 11/09/2022 with complaints of continued chest pain and shortness of breath that felt the same as when she went into the hospital but less intense and not lasting as long.  She tried walking for the prior 3 days and feels the pain comes on when she stops.  She had not tried NTG.  She denied pain with deep breathing, laying back or leaning forward.  She reported nausea and vomiting on atorvastatin and requested a decreased dose.  She was started on isosorbide.  Today, patient reports she is finally starting to feel better. She had a little hang up with the mail in pharmacy so she did not start isosorbide until 7/27. She is taking it at night. She has been waking up with a headache but it is lasting a shorter and shorter time period. She continues to have some upper chest tightness followed by substernal pain rated as 4/10. She is able to take walks  and do things around the house. She would like to get started with cardiac rehab. With her stable angina improving I think this is appropriate. She is doing better on the decreased dose of atorvastatin and is no longer experiencing nausea. She has knee and hip pain but is unsure if that is related to the weather. She would like to continue on the atorvastatin for now and see if that improves. She has a history of intolerance to  rosuvastatin. If she needs to stop the atorvastatin she will need a pharm D lipid clinic referral. She reports palpitations described as flutter in her chest lasting minutes and resolving on its own. This is different than previous palpitations and started after her heart attack.     ROS: All other systems reviewed and are otherwise negative except as noted in History of Present Illness.  Studies Reviewed     EKG is not ordered today.          Physical Exam    VS:  BP 108/86 (BP Location: Left Arm, Patient Position: Sitting, Cuff Size: Normal)   Pulse 87   Ht 5\' 2"  (1.575 m)   Wt 143 lb (64.9 kg)   LMP 11/03/1993   SpO2 95%   BMI 26.16 kg/m  , BMI Body mass index is 26.16 kg/m.  GEN: Well nourished, well developed, in no acute distress. Neck: No JVD or carotid bruits. Cardiac:  RRR. No murmurs. No rubs or gallops.   Respiratory:  Respirations regular and unlabored. Clear to auscultation without rales, wheezing or rhonchi. GI: Soft, nontender, nondistended. Extremities: Radials/DP/PT 2+ and equal bilaterally. No clubbing or cyanosis. No edema.  Skin: Warm and dry, no rash. Neuro: Strength intact.  Assessment & Plan    CAD.  S/p STEMI 10/23/2022 undergoing PCI with DES to mid RCA.  Unfortunately, she developed chest pain with nausea and vomiting after discharge and was readmitted on 10/26/2022.  Relook catheterization showed patent RCA stent.  Patient reports continued upper chest tightness followed by substernal chest pain rated as 4/10. This has started to improve. She is very interested in getting started with cardiac rehab. Given improvement in stable angina I think that is appropriate. Continue aspirin, Brilinta, atorvastatin, metoprolol, isosorbide, as needed SL NTG. Palpitations. Described as flutter in chest lasting minutes and resolving on its own. This is different than previous palpitations and started after MI. Discussed repeat even monitoring. She would like to weight  until her heart "heals." Continue metoprolol.  Hypertension: BP today 108/86. Patient denies headaches, dizziness or vision changes. Continue metoprolol. Hyperlipidemia.  LDL July 2024 113, not at goal.  Patient with history of intolerance to rosuvastatin.  She was started on atorvastatin during hospitalization for STEMI but the high dose was causing nausea/vomiting.  Patient reports she is tolerating reduced dose of atorvastatin and nausea is resolved. She is having increased  knee and hip pain but is unsure if this from the statin or the weather. She would like to stay on it for a while longer to see if it gets better. If she has to stop it she will need a referral to pharm D lipid clinic.   Tobacco abuse. Patient has had only 2 cigarettes since hospital discharge. She is congratulated and encouraged to continue complete cessation.  Disposition: Return for previously scheduled visit with Dr. Tresa Endo 03/08/2023 or sooner as needed.     Cardiac Rehabilitation Eligibility Assessment  The patient is ready to start cardiac rehabilitation from a cardiac standpoint. (having improving  stable angina but appropriate to start)         Signed, Etta Grandchild. , DNP, NP-C

## 2022-11-30 ENCOUNTER — Encounter: Payer: Self-pay | Admitting: Student

## 2022-11-30 ENCOUNTER — Ambulatory Visit: Payer: Medicare Other | Admitting: Student

## 2022-11-30 VITALS — BP 108/86 | HR 87 | Ht 62.0 in | Wt 143.0 lb

## 2022-11-30 DIAGNOSIS — I10 Essential (primary) hypertension: Secondary | ICD-10-CM | POA: Insufficient documentation

## 2022-11-30 DIAGNOSIS — I25118 Atherosclerotic heart disease of native coronary artery with other forms of angina pectoris: Secondary | ICD-10-CM | POA: Diagnosis not present

## 2022-11-30 DIAGNOSIS — Z720 Tobacco use: Secondary | ICD-10-CM | POA: Insufficient documentation

## 2022-11-30 DIAGNOSIS — R002 Palpitations: Secondary | ICD-10-CM | POA: Diagnosis not present

## 2022-11-30 DIAGNOSIS — E785 Hyperlipidemia, unspecified: Secondary | ICD-10-CM | POA: Diagnosis not present

## 2022-11-30 NOTE — Patient Instructions (Signed)
Medication Instructions:  No medication changes *If you need a refill on your cardiac medications before your next appointment, please call your pharmacy*   Lab Work: None  If you have labs (blood work) drawn today and your tests are completely normal, you will receive your results only by: MyChart Message (if you have MyChart) OR A paper copy in the mail If you have any lab test that is abnormal or we need to change your treatment, we will call you to review the results.   Testing/Procedures: none   Follow-Up: At [facility], you and your health needs are our priority.  As part of our continuing mission to provide you with exceptional heart care, we have created designated Provider Care Teams.  These Care Teams include your primary Cardiologist (physician) and Advanced Practice Providers (APPs -  Physician Assistants and Nurse Practitioners) who all work together to provide you with the care you need, when you need it.  We recommend signing up for the patient portal called "MyChart".  Sign up information is provided on this After Visit Summary.  MyChart is used to connect with patients for Virtual Visits (Telemedicine).  Patients are able to view lab/test results, encounter notes, upcoming appointments, etc.  Non-urgent messages can be sent to your provider as well.   To learn more about what you can do with MyChart, go to [email or web address].    Your next appointment:   Keep scheduled appointment   Provider:   Nicki Guadalajara, MD

## 2022-12-02 ENCOUNTER — Encounter (HOSPITAL_COMMUNITY): Payer: Self-pay

## 2022-12-02 ENCOUNTER — Telehealth (HOSPITAL_COMMUNITY): Payer: Self-pay

## 2022-12-02 NOTE — Telephone Encounter (Signed)
Called patient to see if she was interested in participating in the Cardiac Rehab Program. Patient stated yes. Patient will come in for orientation on 8/20@1030  and will attend the 1:45 exercise class.   Sent package.

## 2022-12-06 ENCOUNTER — Encounter (HOSPITAL_COMMUNITY): Payer: Self-pay

## 2022-12-06 ENCOUNTER — Encounter (HOSPITAL_COMMUNITY)
Admission: RE | Admit: 2022-12-06 | Discharge: 2022-12-06 | Disposition: A | Discharge status: Home / Self Care | Payer: Medicare Other | Source: Ambulatory Visit | Attending: Cardiovascular Disease | Admitting: Cardiovascular Disease

## 2022-12-06 VITALS — BP 118/80 | HR 72 | Ht 63.75 in | Wt 145.1 lb

## 2022-12-06 DIAGNOSIS — Z955 Presence of coronary angioplasty implant and graft: Secondary | ICD-10-CM | POA: Insufficient documentation

## 2022-12-06 DIAGNOSIS — I2111 ST elevation (STEMI) myocardial infarction involving right coronary artery: Secondary | ICD-10-CM | POA: Diagnosis not present

## 2022-12-06 HISTORY — DX: Atherosclerotic heart disease of native coronary artery without angina pectoris: I25.10

## 2022-12-06 HISTORY — DX: Essential (primary) hypertension: I10

## 2022-12-06 HISTORY — DX: Hyperlipidemia, unspecified: E78.5

## 2022-12-06 NOTE — Progress Notes (Signed)
Cardiac Rehab Medication Review by a Nurse  Does the patient  feel that his/her medications are working for him/her?  yes  Has the patient been experiencing any side effects to the medications prescribed?  yes  Does the patient measure his/her own blood pressure or blood glucose at home?  yes   Does the patient have any problems obtaining medications due to transportation or finances?   no  Understanding of regimen: good Understanding of indications: good Potential of compliance: excellent    Nurse  comments: Wendy Lopez is taking her medications as prescribed and has a good understanding of what her medications are for. Wendy Lopez says she as a slight intermittent headache from taking isosorbide. Wendy Lopez takes her blood pressures twice a day. Wendy Lopez is not taking losartan currently as is makes her blood pressure too low. Wendy Lopez is not taking Neurontin currently as she is not having any headaches. Wendy Lopez carries her Epi pen with her.     Arta [facility] RN 12/06/2022 10:58 AM

## 2022-12-06 NOTE — Progress Notes (Signed)
Cardiac Individual Treatment Plan  Patient Details  Name: Wendy Lopez MRN: [id number] Date of Birth: 22-Jun-1960 Referring Provider:   Flowsheet Row INTENSIVE CARDIAC REHAB ORIENT from 12/06/2022 in [facility] for Heart, Vascular, & Lung Health  Referring Provider Lennette Bihari, MD       Initial Encounter Date:  Flowsheet Row INTENSIVE CARDIAC REHAB ORIENT from 12/06/2022 in [facility] for Heart, Vascular, & Lung Health  Date 12/06/22       Visit Diagnosis: 10/23/22 STEMI  10/23/22 PCI / DES RCA  Patient's Home Medications on Admission:  Current Outpatient Medications:    aspirin EC 81 MG tablet, Take 1 tablet (81 mg total) by mouth daily. Swallow whole., Disp: 120 tablet, Rfl: 3   atorvastatin (LIPITOR) 80 MG tablet, Take 1 tablet (80 mg total) by mouth daily. (Patient taking differently: Take 80 mg by mouth daily. Pt says takes have pt tab 40mg ), Disp: 90 tablet, Rfl: 1   Cyanocobalamin (VITAMIN B-12) 3000 MCG/ML LIQD, Inject 3,000 mcg as directed every 30 (thirty) days., Disp: , Rfl:    EPINEPHrine 0.3 mg/0.3 mL IJ SOAJ injection, Inject 0.3 mg into the muscle as needed for anaphylaxis., Disp: , Rfl:    isosorbide mononitrate (IMDUR) 30 MG 24 hr tablet, Take 0.5 tablets (15 mg total) by mouth daily., Disp: 45 tablet, Rfl: 1   levETIRAcetam (KEPPRA) 750 MG tablet, Take 1 tablet (750 mg total) by mouth 2 (two) times daily., Disp: 60 tablet, Rfl: 11   metoprolol succinate (TOPROL-XL) 100 MG 24 hr tablet, TAKE 1 TABLET(100 MG) BY MOUTH DAILY WITH OR IMMEDIATELY FOLLOWING A MEAL, Disp: 90 tablet, Rfl: 3   nitroGLYCERIN (NITROSTAT) 0.4 MG SL tablet, Place 1 tablet (0.4 mg total) under the tongue every 5 (five) minutes as needed., Disp: 25 tablet, Rfl: 2   sodium chloride (OCEAN) 0.65 % nasal spray, Place 1-2 sprays into the nose as needed for congestion., Disp: , Rfl:    ticagrelor (BRILINTA) 90 MG TABS tablet, Take 1 tablet (90 mg  total) by mouth 2 (two) times daily., Disp: 180 tablet, Rfl: 2   ZYRTEC ALLERGY 10 MG tablet, Take 10 mg by mouth at bedtime as needed for allergies., Disp: , Rfl:    gabapentin (NEURONTIN) 100 MG capsule, Take 1 capsule (100 mg total) by mouth at bedtime. (Patient not taking: Reported on 11/30/2022), Disp: 30 capsule, Rfl: 0   losartan (COZAAR) 100 MG tablet, Take 1 tablet (100 mg total) by mouth daily. (Patient not taking: Reported on 11/30/2022), Disp: 30 tablet, Rfl: 1   nicotine (NICODERM CQ - DOSED IN MG/24 HOURS) 14 mg/24hr patch, Place 1 patch (14 mg total) onto the skin daily. (Patient not taking: Reported on 11/30/2022), Disp: 30 patch, Rfl: 0   ondansetron (ZOFRAN-ODT) 4 MG disintegrating tablet, Take 1 tablet (4 mg total) by mouth every 8 (eight) hours as needed for nausea or vomiting. (Patient not taking: Reported on 12/06/2022), Disp: 20 tablet, Rfl: 0   Tiotropium Bromide-Olodaterol (STIOLTO RESPIMAT) 2.5-2.5 MCG/ACT AERS, Inhale 2 puffs into the lungs daily. (Patient not taking: Reported on 11/30/2022), Disp: 4 g, Rfl: 5  Past Medical History: Past Medical History:  Diagnosis Date   Anxiety and depression    Arthritis    Breast cancer (HCC) 02/13/2009   Cancer of right breast (HCC) 04/16/2012   Coronary artery disease    Depression    Depression 04/18/2012   Dysrhythmia    palpitations   Epilepsy (HCC)  last seizure 1990's   H/O hiatal hernia    Hyperlipidemia    Hypertension    Localization-related (focal) (partial) epilepsy and epileptic syndromes with complex partial seizures, without mention of intractable epilepsy 02/07/2013   Memory deficit 02/07/2013   Memory loss    Mitral valve prolapse 06/01/2010   echo- EF>55% mild tricupsid regurgitation. There is mild Pulmonary Hypertension.. Right  ventricular systolic pressure iselevated at 30-40 mmHg. LV systolic function is normal.   Osteopenia    PONV (postoperative nausea and vomiting)    SOB (shortness of breath)  10/18/2011   met-test   Spinal fusion failure    3    Tobacco Use: Social History   Tobacco Use  Smoking Status Some Days   Current packs/day: 1.00   Average packs/day: 1 pack/day for 43.0 years (43.0 ttl pk-yrs)   Types: Cigarettes  Smokeless Tobacco Never  Tobacco Comments   Smokes a 1/2 of a cigarette a week. Has talked to [facility]. Offered virtual cessation classes declined. 12/06/22 MWW RN    Labs: Review Flowsheet       Latest Ref Rng & Units 02/17/2017 06/14/2022 10/23/2022 10/26/2022  Labs for ITP Cardiac and Pulmonary Rehab  Cholestrol 0 - 200 mg/dL 147  829  562  130   LDL (calc) 0 - 99 mg/dL 87  865  784  696   HDL-C >40 mg/dL 70  66  64  70   Trlycerides <150 mg/dL 65  71  83  71   Hemoglobin A1c 4.8 - 5.6 % - - 5.5  5.6   TCO2 22 - 32 mmol/L - - 24  -    Details            Capillary Blood Glucose: No results found for: "GLUCAP"   Exercise Target Goals: Exercise Program Goal: Individual exercise prescription set using results from initial 6 min walk test and THRR while considering  patient's activity barriers and safety.   Exercise Prescription Goal: Initial exercise prescription builds to 30-45 minutes a day of aerobic activity, 2-3 days per week.  Home exercise guidelines will be given to patient during program as part of exercise prescription that the participant will acknowledge.  Activity Barriers & Risk Stratification:  Activity Barriers & Cardiac Risk Stratification - 12/06/22 1117       Activity Barriers & Cardiac Risk Stratification   Activity Barriers Neck/Spine Problems;Balance Concerns;History of Falls;Other (comment);Arthritis;Back Problems;Chest Pain/Angina;Shortness of Breath    Comments Bulging disc- neck pain; seizure disorder-controlled; no cartilage in right knee-pain; lumbar spine fusion-back surgery x3    Cardiac Risk Stratification High             6 Minute Walk:  6 Minute Walk     Row Name 12/06/22 1200         6  Minute Walk   Phase Initial     Distance 1129 feet     Walk Time 6 minutes     # of Rest Breaks 0     MPH 2.14     METS 3.08     RPE 10.5     Perceived Dyspnea  0     VO2 Peak 10.77     Symptoms Yes (comment)     Comments Right knee and ankle pain- chronic, 1/10 on pain scale.     Resting HR 72 bpm     Resting BP 118/80     Resting Oxygen Saturation  97 %     Exercise Oxygen Saturation  during 6 min walk 95 %     Max Ex. HR 86 bpm     Max Ex. BP 148/80     2 Minute Post BP 128/80              Oxygen Initial Assessment:   Oxygen Re-Evaluation:   Oxygen Discharge (Final Oxygen Re-Evaluation):   Initial Exercise Prescription:  Initial Exercise Prescription - 12/06/22 1100       Date of Initial Exercise RX and Referring Provider   Date 12/06/22    Referring Provider Lennette Bihari, MD    Expected Discharge Date 03/01/23      Recumbant Bike   Level 1    Watts 20    Minutes 15    METs 1.5      NuStep   Level 1    SPM 85    Minutes 15    METs 1.5      Prescription Details   Frequency (times per week) 2    Duration Progress to 30 minutes of continuous aerobic without signs/symptoms of physical distress      Intensity   THRR 40-80% of Max Heartrate 63-126    Ratings of Perceived Exertion 11-13    Perceived Dyspnea 0-4      Progression   Progression Continue to progress workloads to maintain intensity without signs/symptoms of physical distress.      Resistance Training   Training Prescription Yes    Weight 2 lbs    Reps 10-15             Perform Capillary Blood Glucose checks as needed.  Exercise Prescription Changes:   Exercise Comments:   Exercise Goals and Review:   Exercise Goals     Row Name 12/06/22 1118             Exercise Goals   Increase Physical Activity Yes       Intervention Provide advice, education, support and counseling about physical activity/exercise needs.;Develop an individualized exercise prescription for  aerobic and resistive training based on initial evaluation findings, risk stratification, comorbidities and participant's personal goals.       Expected Outcomes Short Term: Attend rehab on a regular basis to increase amount of physical activity.;Long Term: Add in home exercise to make exercise part of routine and to increase amount of physical activity.;Long Term: Exercising regularly at least 3-5 days a week.       Increase Strength and Stamina Yes       Intervention Provide advice, education, support and counseling about physical activity/exercise needs.;Develop an individualized exercise prescription for aerobic and resistive training based on initial evaluation findings, risk stratification, comorbidities and participant's personal goals.       Expected Outcomes Short Term: Increase workloads from initial exercise prescription for resistance, speed, and METs.;Short Term: Perform resistance training exercises routinely during rehab and add in resistance training at home;Long Term: Improve cardiorespiratory fitness, muscular endurance and strength as measured by increased METs and functional capacity ( )       Able to understand and use rate of perceived exertion (RPE) scale Yes       Intervention Provide education and explanation on how to use RPE scale       Expected Outcomes Short Term: Able to use RPE daily in rehab to express subjective intensity level;Long Term:  Able to use RPE to guide intensity level when exercising independently       Knowledge and understanding of Target Heart Rate Range (THRR) Yes  Intervention Provide education and explanation of THRR including how the numbers were predicted and where they are located for reference       Expected Outcomes Short Term: Able to state/look up THRR;Long Term: Able to use THRR to govern intensity when exercising independently;Short Term: Able to use daily as guideline for intensity in rehab       Able to check pulse independently Yes        Intervention Provide education and demonstration on how to check pulse in carotid and radial arteries.;Review the importance of being able to check your own pulse for safety during independent exercise       Expected Outcomes Short Term: Able to explain why pulse checking is important during independent exercise;Long Term: Able to check pulse independently and accurately       Understanding of Exercise Prescription Yes       Intervention Provide education, explanation, and written materials on patient's individual exercise prescription       Expected Outcomes Short Term: Able to explain program exercise prescription;Long Term: Able to explain home exercise prescription to exercise independently                Exercise Goals Re-Evaluation :   Discharge Exercise Prescription (Final Exercise Prescription Changes):   Nutrition:  Target Goals: Understanding of nutrition guidelines, daily intake of sodium 1500mg , cholesterol 200mg , calories 30% from fat and 7% or less from saturated fats, daily to have 5 or more servings of fruits and vegetables.  Biometrics:  Pre Biometrics - 12/06/22 1013       Pre Biometrics   Waist Circumference 32 inches    Hip Circumference 40 inches    Waist to Hip Ratio 0.8 %    Triceps Skinfold 28 mm    % Body Fat 36.2 %    Grip Strength 22 kg    Flexibility --   Not performed, lumbar spine fusion.   Single Leg Stand 5.75 seconds              Nutrition Therapy Plan and Nutrition Goals:   Nutrition Assessments:  MEDIFICTS Score Key: ?70 Need to make dietary changes  40-70 Heart Healthy Diet ? 40 Therapeutic Level Cholesterol Diet    Picture Your Plate Scores: <56 Unhealthy dietary pattern with much room for improvement. 41-50 Dietary pattern unlikely to meet recommendations for good health and room for improvement. 51-60 More healthful dietary pattern, with some room for improvement.  >60 Healthy dietary pattern, although there may be  some specific behaviors that could be improved.    Nutrition Goals Re-Evaluation:   Nutrition Goals Re-Evaluation:   Nutrition Goals Discharge (Final Nutrition Goals Re-Evaluation):   Psychosocial: Target Goals: Acknowledge presence or absence of significant depression and/or stress, maximize coping skills, provide positive support system. Participant is able to verbalize types and ability to use techniques and skills needed for reducing stress and depression.  Initial Review & Psychosocial Screening:  Initial Psych Review & Screening - 12/06/22 1124       Initial Review   Current issues with History of Depression;Current Depression;Current Stress Concerns;Current Sleep Concerns    Source of Stress Concerns Chronic Illness;Unable to perform yard/household activities;Unable to participate in former interests or hobbies    Comments Quin says she has had some difficulty sleeping due to taking new medication isosorbide. Rhodes had stress related to recent MI stenitng, continued pain.      Family Dynamics   Good Support System? Yes   Rykia has her husband,  son, siblings and church family for support     Barriers   Psychosocial barriers to participate in program The patient should benefit from training in stress management and relaxation.      Screening Interventions   Interventions Encouraged to exercise    Expected Outcomes Long Term Goal: Stressors or current issues are controlled or eliminated.;Short Term goal: Utilizing psychosocial counselor, staff and physician to assist with identification of specific Stressors or current issues interfering with healing process. Setting desired goal for each stressor or current issue identified.;Long Term goal: The participant improves quality of Life and PHQ9 Scores as seen by post scores and/or verbalization of changes;Short Term goal: Identification and review with participant of any Quality of Life or Depression concerns found by scoring  the questionnaire.             Quality of Life Scores:  Quality of Life - 12/06/22 1121       Quality of Life   Select Quality of Life      Quality of Life Scores   Health/Function Pre 25.6 %    Socioeconomic Pre 28 %    Psych/Spiritual Pre 27.07 %    Family Pre 30 %    GLOBAL Pre 27.04 %            Scores of 19 and below usually indicate a poorer quality of life in these areas.  A difference of  2-3 points is a clinically meaningful difference.  A difference of 2-3 points in the total score of the Quality of Life Index has been associated with significant improvement in overall quality of life, self-image, physical symptoms, and general health in studies assessing change in quality of life.  PHQ-9: Review Flowsheet       12/06/2022 03/18/2014  Depression screen PHQ 2/9  Decreased Interest 0 0  Down, Depressed, Hopeless 1 0  PHQ - 2 Score 1 0  Altered sleeping 1 -  Tired, decreased energy 2 -  Change in appetite 0 -  Feeling bad or failure about yourself  0 -  Trouble concentrating 1 -  Moving slowly or fidgety/restless 0 -  Suicidal thoughts 0 -  PHQ-9 Score 5 -  Difficult doing work/chores Not difficult at all -    Details           Interpretation of Total Score  Total Score Depression Severity:  1-4 = Minimal depression, 5-9 = Mild depression, 10-14 = Moderate depression, 15-19 = Moderately severe depression, 20-27 = Severe depression   Psychosocial Evaluation and Intervention:   Psychosocial Re-Evaluation:   Psychosocial Discharge (Final Psychosocial Re-Evaluation):   Vocational Rehabilitation: Provide vocational rehab assistance to qualifying candidates.   Vocational Rehab Evaluation & Intervention:  Vocational Rehab - 12/06/22 1211       Initial Vocational Rehab Evaluation & Intervention   Assessment shows need for Vocational Rehabilitation No   Quaneisha is disabled and does not need vocational rehab at this time.             Education: Education Goals: Education classes will be provided on a weekly basis, covering required topics. Participant will state understanding/return demonstration of topics presented.     Core Videos: Exercise    Move It!  Clinical staff conducted group or individual video education with verbal and written material and guidebook.  Patient learns the recommended Pritikin exercise program. Exercise with the goal of living a long, healthy life. Some of the health benefits of exercise include controlled diabetes, healthier blood  pressure levels, improved cholesterol levels, improved heart and lung capacity, improved sleep, and better body composition. Everyone should speak with their doctor before starting or changing an exercise routine.  Biomechanical Limitations Clinical staff conducted group or individual video education with verbal and written material and guidebook.  Patient learns how biomechanical limitations can impact exercise and how we can mitigate and possibly overcome limitations to have an impactful and balanced exercise routine.  Body Composition Clinical staff conducted group or individual video education with verbal and written material and guidebook.  Patient learns that body composition (ratio of muscle mass to fat mass) is a key component to assessing overall fitness, rather than body weight alone. Increased fat mass, especially visceral belly fat, can put [place] at increased risk for metabolic syndrome, type 2 diabetes, heart disease, and even death. It is recommended to combine diet and exercise (cardiovascular and resistance training) to improve your body composition. Seek guidance from your physician and exercise physiologist before implementing an exercise routine.  Exercise Action Plan Clinical staff conducted group or individual video education with verbal and written material and guidebook.  Patient learns the recommended strategies to achieve and enjoy long-term  exercise adherence, including variety, self-motivation, self-efficacy, and positive decision making. Benefits of exercise include fitness, good health, weight management, more energy, better sleep, less stress, and overall well-being.  Medical   Heart Disease Risk Reduction Clinical staff conducted group or individual video education with verbal and written material and guidebook.  Patient learns our heart is our most vital organ as it circulates oxygen, nutrients, white blood cells, and hormones throughout the entire body, and carries waste away. Data supports a plant-based eating plan like the Pritikin Program for its effectiveness in slowing progression of and reversing heart disease. The video provides a number of recommendations to address heart disease.   Metabolic Syndrome and Belly Fat  Clinical staff conducted group or individual video education with verbal and written material and guidebook.  Patient learns what metabolic syndrome is, how it leads to heart disease, and how one can reverse it and keep it from coming back. You have metabolic syndrome if you have 3 of the following 5 criteria: abdominal obesity, high blood pressure, high triglycerides, low HDL cholesterol, and high blood sugar.  Hypertension and Heart Disease Clinical staff conducted group or individual video education with verbal and written material and guidebook.  Patient learns that high blood pressure, or hypertension, is very common in the [place]. Hypertension is largely due to excessive salt intake, but other important risk factors include being overweight, physical inactivity, drinking too much alcohol, smoking, and not eating enough potassium from fruits and vegetables. High blood pressure is a leading risk factor for heart attack, stroke, congestive heart failure, dementia, kidney failure, and premature death. Long-term effects of excessive salt intake include stiffening of the arteries and thickening of heart  muscle and organ damage. Recommendations include ways to reduce hypertension and the risk of heart disease.  Diseases of Our Time - Focusing on Diabetes Clinical staff conducted group or individual video education with verbal and written material and guidebook.  Patient learns why the best way to stop diseases of our time is prevention, through food and other lifestyle changes. Medicine (such as prescription pills and surgeries) is often only a Band-Aid on the problem, not a long-term solution. Most common diseases of our time include obesity, type 2 diabetes, hypertension, heart disease, and cancer. The Pritikin Program is recommended and has been proven to help  reduce, reverse, and/or prevent the damaging effects of metabolic syndrome.  Nutrition   Overview of the Pritikin Eating Plan  Clinical staff conducted group or individual video education with verbal and written material and guidebook.  Patient learns about the Pritikin Eating Plan for disease risk reduction. The Pritikin Eating Plan emphasizes a wide variety of unrefined, minimally-processed carbohydrates, like fruits, vegetables, whole grains, and legumes. Go, Caution, and Stop food choices are explained. Plant-based and lean animal proteins are emphasized. Rationale provided for low sodium intake for blood pressure control, low added sugars for blood sugar stabilization, and low added fats and oils for coronary artery disease risk reduction and weight management.  Calorie Density  Clinical staff conducted group or individual video education with verbal and written material and guidebook.  Patient learns about calorie density and how it impacts the Pritikin Eating Plan. Knowing the characteristics of the food you choose will help you decide whether those foods will lead to weight gain or weight loss, and whether you want to consume more or less of them. Weight loss is usually a side effect of the Pritikin Eating Plan because of its focus on  low calorie-dense foods.  Label Reading  Clinical staff conducted group or individual video education with verbal and written material and guidebook.  Patient learns about the Pritikin recommended label reading guidelines and corresponding recommendations regarding calorie density, added sugars, sodium content, and whole grains.  Dining Out - Part 1  Clinical staff conducted group or individual video education with verbal and written material and guidebook.  Patient learns that restaurant meals can be sabotaging because they can be so high in calories, fat, sodium, and/or sugar. Patient learns recommended strategies on how to positively address this and avoid unhealthy pitfalls.  Facts on Fats  Clinical staff conducted group or individual video education with verbal and written material and guidebook.  Patient learns that lifestyle modifications can be just as effective, if not more so, as many medications for lowering your risk of heart disease. A Pritikin lifestyle can help to reduce your risk of inflammation and atherosclerosis (cholesterol build-up, or plaque, in the artery walls). Lifestyle interventions such as dietary choices and physical activity address the cause of atherosclerosis. A review of the types of fats and their impact on blood cholesterol levels, along with dietary recommendations to reduce fat intake is also included.  Nutrition Action Plan  Clinical staff conducted group or individual video education with verbal and written material and guidebook.  Patient learns how to incorporate Pritikin recommendations into their lifestyle. Recommendations include planning and keeping personal health goals in mind as an important part of their success.  Healthy Mind-Set    Healthy Minds, Bodies, Hearts  Clinical staff conducted group or individual video education with verbal and written material and guidebook.  Patient learns how to identify when they are stressed. Video will discuss  the impact of that stress, as well as the many benefits of stress management. Patient will also be introduced to stress management techniques. The way we think, act, and feel has an impact on our hearts.  How Our Thoughts Can Heal Our Hearts  Clinical staff conducted group or individual video education with verbal and written material and guidebook.  Patient learns that negative thoughts can cause depression and anxiety. This can result in negative lifestyle behavior and serious health problems. Cognitive behavioral therapy is an effective method to help control our thoughts in order to change and improve our emotional outlook.  Additional Videos:  Exercise    Improving Performance  Clinical staff conducted group or individual video education with verbal and written material and guidebook.  Patient learns to use a non-linear approach by alternating intensity levels and lengths of time spent exercising to help burn more calories and lose more body fat. Cardiovascular exercise helps improve heart health, metabolism, hormonal balance, blood sugar control, and recovery from fatigue. Resistance training improves strength, endurance, balance, coordination, reaction time, metabolism, and muscle mass. Flexibility exercise improves circulation, posture, and balance. Seek guidance from your physician and exercise physiologist before implementing an exercise routine and learn your capabilities and proper form for all exercise.  Introduction to Yoga  Clinical staff conducted group or individual video education with verbal and written material and guidebook.  Patient learns about yoga, a discipline of the coming together of mind, breath, and body. The benefits of yoga include improved flexibility, improved range of motion, better posture and core strength, increased lung function, weight loss, and positive self-image. Yoga's heart health benefits include lowered blood pressure, healthier heart rate, decreased  cholesterol and triglyceride levels, improved immune function, and reduced stress. Seek guidance from your physician and exercise physiologist before implementing an exercise routine and learn your capabilities and proper form for all exercise.  Medical   Aging: Enhancing Your Quality of Life  Clinical staff conducted group or individual video education with verbal and written material and guidebook.  Patient learns key strategies and recommendations to stay in good physical health and enhance quality of life, such as prevention strategies, having an advocate, securing a Health Care Proxy and Power of Attorney, and keeping a list of medications and system for tracking them. It also discusses how to avoid risk for bone loss.  Biology of Weight Control  Clinical staff conducted group or individual video education with verbal and written material and guidebook.  Patient learns that weight gain occurs because we consume more calories than we burn (eating more, moving less). Even if your body weight is normal, you may have higher ratios of fat compared to muscle mass. Too much body fat puts you at increased risk for cardiovascular disease, heart attack, stroke, type 2 diabetes, and obesity-related cancers. In addition to exercise, following the Pritikin Eating Plan can help reduce your risk.  Decoding Lab Results  Clinical staff conducted group or individual video education with verbal and written material and guidebook.  Patient learns that lab test reflects one measurement whose values change over time and are influenced by many factors, including medication, stress, sleep, exercise, food, hydration, pre-existing medical conditions, and more. It is recommended to use the knowledge from this video to become more involved with your lab results and evaluate your numbers to speak with your doctor.   Diseases of Our Time - Overview  Clinical staff conducted group or individual video education with verbal  and written material and guidebook.  Patient learns that according to the CDC, 50% to 70% of chronic diseases (such as obesity, type 2 diabetes, elevated lipids, hypertension, and heart disease) are avoidable through lifestyle improvements including healthier food choices, listening to satiety cues, and increased physical activity.  Sleep Disorders Clinical staff conducted group or individual video education with verbal and written material and guidebook.  Patient learns how good quality and duration of sleep are important to overall health and well-being. Patient also learns about sleep disorders and how they impact health along with recommendations to address them, including discussing with a physician.  Nutrition  Dining Out - Part  2 Clinical staff conducted group or individual video education with verbal and written material and guidebook.  Patient learns how to plan ahead and communicate in order to maximize their dining experience in a healthy and nutritious manner. Included are recommended food choices based on the type of restaurant the patient is visiting.   Fueling a Banker conducted group or individual video education with verbal and written material and guidebook.  There is a strong connection between our food choices and our health. Diseases like obesity and type 2 diabetes are very prevalent and are in large-part due to lifestyle choices. The Pritikin Eating Plan provides plenty of food and hunger-curbing satisfaction. It is easy to follow, affordable, and helps reduce health risks.  Menu Workshop  Clinical staff conducted group or individual video education with verbal and written material and guidebook.  Patient learns that restaurant meals can sabotage health goals because they are often packed with calories, fat, sodium, and sugar. Recommendations include strategies to plan ahead and to communicate with the manager, chef, or server to help order a healthier  meal.  Planning Your Eating Strategy  Clinical staff conducted group or individual video education with verbal and written material and guidebook.  Patient learns about the Pritikin Eating Plan and its benefit of reducing the risk of disease. The Pritikin Eating Plan does not focus on calories. Instead, it emphasizes high-quality, nutrient-rich foods. By knowing the characteristics of the foods, we choose, we can determine their calorie density and make informed decisions.  Targeting Your Nutrition Priorities  Clinical staff conducted group or individual video education with verbal and written material and guidebook.  Patient learns that lifestyle habits have a tremendous impact on disease risk and progression. This video provides eating and physical activity recommendations based on your personal health goals, such as reducing LDL cholesterol, losing weight, preventing or controlling type 2 diabetes, and reducing high blood pressure.  Vitamins and Minerals  Clinical staff conducted group or individual video education with verbal and written material and guidebook.  Patient learns different ways to obtain key vitamins and minerals, including through a recommended healthy diet. It is important to discuss all supplements you take with your doctor.   Healthy Mind-Set    Smoking Cessation  Clinical staff conducted group or individual video education with verbal and written material and guidebook.  Patient learns that cigarette smoking and tobacco addiction pose a serious health risk which affects millions of people. Stopping smoking will significantly reduce the risk of heart disease, lung disease, and many forms of cancer. Recommended strategies for quitting are covered, including working with your doctor to develop a successful plan.  Culinary   Becoming a Set designer conducted group or individual video education with verbal and written material and guidebook.  Patient learns  that cooking at home can be healthy, cost-effective, quick, and puts them in control. Keys to cooking healthy recipes will include looking at your recipe, assessing your equipment needs, planning ahead, making it simple, choosing cost-effective seasonal ingredients, and limiting the use of added fats, salts, and sugars.  Cooking - Breakfast and Snacks  Clinical staff conducted group or individual video education with verbal and written material and guidebook.  Patient learns how important breakfast is to satiety and nutrition through the entire day. Recommendations include key foods to eat during breakfast to help stabilize blood sugar levels and to prevent overeating at meals later in the day. Planning ahead is also a key component.  Cooking - Educational psychologist conducted group or individual video education with verbal and written material and guidebook.  Patient learns eating strategies to improve overall health, including an approach to cook more at home. Recommendations include thinking of animal protein as a side on your plate rather than center stage and focusing instead on lower calorie dense options like vegetables, fruits, whole grains, and plant-based proteins, such as beans. Making sauces in large quantities to freeze for later and leaving the skin on your vegetables are also recommended to maximize your experience.  Cooking - Healthy Salads and Dressing Clinical staff conducted group or individual video education with verbal and written material and guidebook.  Patient learns that vegetables, fruits, whole grains, and legumes are the foundations of the Pritikin Eating Plan. Recommendations include how to incorporate each of these in flavorful and healthy salads, and how to create homemade salad dressings. Proper handling of ingredients is also covered. Cooking - Soups and [employer] - Soups and Desserts Clinical staff conducted group or individual video education with  verbal and written material and guidebook.  Patient learns that Pritikin soups and desserts make for easy, nutritious, and delicious snacks and meal components that are low in sodium, fat, sugar, and calorie density, while high in vitamins, minerals, and filling fiber. Recommendations include simple and healthy ideas for soups and desserts.   Overview     The Pritikin Solution Program Overview Clinical staff conducted group or individual video education with verbal and written material and guidebook.  Patient learns that the results of the Pritikin Program have been documented in more than 100 articles published in peer-reviewed journals, and the benefits include reducing risk factors for (and, in some cases, even reversing) high cholesterol, high blood pressure, type 2 diabetes, obesity, and more! An overview of the three key pillars of the Pritikin Program will be covered: eating well, doing regular exercise, and having a healthy mind-set.  WORKSHOPS  Exercise: Exercise Basics: Building Your Action Plan Clinical staff led group instruction and group discussion with PowerPoint presentation and patient guidebook. To enhance the learning environment the use of posters, models and videos may be added. At the conclusion of this workshop, patients will comprehend the difference between physical activity and exercise, as well as the benefits of incorporating both, into their routine. Patients will understand the FITT (Frequency, Intensity, Time, and Type) principle and how to use it to build an exercise action plan. In addition, safety concerns and other considerations for exercise and cardiac rehab will be addressed by the presenter. The purpose of this lesson is to promote a comprehensive and effective weekly exercise routine in order to improve patients' overall level of fitness.   Managing Heart Disease: Your Path to a Healthier Heart Clinical staff led group instruction and group discussion with  PowerPoint presentation and patient guidebook. To enhance the learning environment the use of posters, models and videos may be added.At the conclusion of this workshop, patients will understand the anatomy and physiology of the heart. Additionally, they will understand how Pritikin's three pillars impact the risk factors, the progression, and the management of heart disease.  The purpose of this lesson is to provide a high-level overview of the heart, heart disease, and how the Pritikin lifestyle positively impacts risk factors.  Exercise Biomechanics Clinical staff led group instruction and group discussion with PowerPoint presentation and patient guidebook. To enhance the learning environment the use of posters, models and videos may be added. Patients will  learn how the structural parts of their bodies function and how these functions impact their daily activities, movement, and exercise. Patients will learn how to promote a neutral spine, learn how to manage pain, and identify ways to improve their physical movement in order to promote healthy living. The purpose of this lesson is to expose patients to common physical limitations that impact physical activity. Participants will learn practical ways to adapt and manage aches and pains, and to minimize their effect on regular exercise. Patients will learn how to maintain good posture while sitting, walking, and lifting.  Balance Training and Fall Prevention  Clinical staff led group instruction and group discussion with PowerPoint presentation and patient guidebook. To enhance the learning environment the use of posters, models and videos may be added. At the conclusion of this workshop, patients will understand the importance of their sensorimotor skills (vision, proprioception, and the vestibular system) in maintaining their ability to balance as they age. Patients will apply a variety of balancing exercises that are appropriate for their  current level of function. Patients will understand the common causes for poor balance, possible solutions to these problems, and ways to modify their physical environment in order to minimize their fall risk. The purpose of this lesson is to teach patients about the importance of maintaining balance as they age and ways to minimize their risk of falling.  WORKSHOPS   Nutrition:  Fueling a Ship broker led group instruction and group discussion with PowerPoint presentation and patient guidebook. To enhance the learning environment the use of posters, models and videos may be added. Patients will review the foundational principles of the Pritikin Eating Plan and understand what constitutes a serving size in each of the food groups. Patients will also learn Pritikin-friendly foods that are better choices when away from home and review make-ahead meal and snack options. Calorie density will be reviewed and applied to three nutrition priorities: weight maintenance, weight loss, and weight gain. The purpose of this lesson is to reinforce (in a group setting) the key concepts around what patients are recommended to eat and how to apply these guidelines when away from home by planning and selecting Pritikin-friendly options. Patients will understand how calorie density may be adjusted for different weight management goals.  Mindful Eating  Clinical staff led group instruction and group discussion with PowerPoint presentation and patient guidebook. To enhance the learning environment the use of posters, models and videos may be added. Patients will briefly review the concepts of the Pritikin Eating Plan and the importance of low-calorie dense foods. The concept of mindful eating will be introduced as well as the importance of paying attention to internal hunger signals. Triggers for non-hunger eating and techniques for dealing with triggers will be explored. The purpose of this lesson is to provide  patients with the opportunity to review the basic principles of the Pritikin Eating Plan, discuss the value of eating mindfully and how to measure internal cues of hunger and fullness using the Hunger Scale. Patients will also discuss reasons for non-hunger eating and learn strategies to use for controlling emotional eating.  Targeting Your Nutrition Priorities Clinical staff led group instruction and group discussion with PowerPoint presentation and patient guidebook. To enhance the learning environment the use of posters, models and videos may be added. Patients will learn how to determine their genetic susceptibility to disease by reviewing their family history. Patients will gain insight into the importance of diet as part of an overall healthy lifestyle  in mitigating the impact of genetics and other environmental insults. The purpose of this lesson is to provide patients with the opportunity to assess their personal nutrition priorities by looking at their family history, their own health history and current risk factors. Patients will also be able to discuss ways of prioritizing and modifying the Pritikin Eating Plan for their highest risk areas  Menu  Clinical staff led group instruction and group discussion with PowerPoint presentation and patient guidebook. To enhance the learning environment the use of posters, models and videos may be added. Using menus brought in from [employer], or printed from [employer], patients will apply the Pritikin dining out guidelines that were presented in the [employer] video. Patients will also be able to practice these guidelines in a variety of provided scenarios. The purpose of this lesson is to provide patients with the opportunity to practice hands-on learning of the Pritikin Dining Out guidelines with actual menus and practice scenarios.  Label Reading Clinical staff led group instruction and group discussion with PowerPoint  presentation and patient guidebook. To enhance the learning environment the use of posters, models and videos may be added. Patients will review and discuss the Pritikin label reading guidelines presented in Pritikin's Label Reading Educational series video. Using fool labels brought in from local grocery stores and markets, patients will apply the label reading guidelines and determine if the packaged food meet the Pritikin guidelines. The purpose of this lesson is to provide patients with the opportunity to review, discuss, and practice hands-on learning of the Pritikin Label Reading guidelines with actual packaged food labels. Cooking School  Pritikin's [employer] are designed to teach patients ways to prepare quick, simple, and affordable recipes at home. The importance of nutrition's role in chronic disease risk reduction is reflected in its emphasis in the overall Pritikin program. By learning how to prepare essential core Pritikin Eating Plan recipes, patients will increase control over what they eat; be able to customize the flavor of foods without the use of added salt, sugar, or fat; and improve the quality of the food they consume. By learning a set of core recipes which are easily assembled, quickly prepared, and affordable, patients are more likely to prepare more healthy foods at home. These workshops focus on convenient breakfasts, simple entres, side dishes, and desserts which can be prepared with minimal effort and are consistent with nutrition recommendations for cardiovascular risk reduction. Cooking [employer] are taught by a Armed forces logistics/support/administrative officer (RD) who has been trained by the [employer]. The chef or RD has a clear understanding of the importance of minimizing - if not completely eliminating - added fat, sugar, and sodium in recipes. Throughout the series of Cooking School Workshop sessions, patients will learn about healthy ingredients and  efficient methods of cooking to build confidence in their capability to prepare    Cooking School weekly topics:  Adding Flavor- Sodium-Free  Fast and Healthy Breakfasts  Powerhouse Plant-Based Proteins  Satisfying Salads and Dressings  Simple Sides and Sauces  International Cuisine-Spotlight on the [employer] Zones  Delicious Desserts  Savory Soups  [employer] - Meals in a Astronomer Appetizers and Snacks  Comforting Weekend Breakfasts  One-Pot Wonders   Fast Evening Meals  Landscape architect Your Pritikin Plate  WORKSHOPS   Healthy Mindset (Psychosocial):  Focused Goals, Sustainable Changes Clinical staff led group instruction and group discussion with PowerPoint presentation and patient guidebook. To enhance the  learning environment the use of posters, models and videos may be added. Patients will be able to apply effective goal setting strategies to establish at least one personal goal, and then take consistent, meaningful action toward that goal. They will learn to identify common barriers to achieving personal goals and develop strategies to overcome them. Patients will also gain an understanding of how our mind-set can impact our ability to achieve goals and the importance of cultivating a positive and growth-oriented mind-set. The purpose of this lesson is to provide patients with a deeper understanding of how to set and achieve personal goals, as well as the tools and strategies needed to overcome common obstacles which may arise along the way.  From Head to Heart: The Power of a Healthy Outlook  Clinical staff led group instruction and group discussion with PowerPoint presentation and patient guidebook. To enhance the learning environment the use of posters, models and videos may be added. Patients will be able to recognize and describe the impact of emotions and mood on physical health. They will discover the importance of self-care and explore self-care  practices which may work for them. Patients will also learn how to utilize the 4 C's to cultivate a healthier outlook and better manage stress and challenges. The purpose of this lesson is to demonstrate to patients how a healthy outlook is an essential part of maintaining good health, especially as they continue their cardiac rehab journey.  Healthy Sleep for a Healthy Heart Clinical staff led group instruction and group discussion with PowerPoint presentation and patient guidebook. To enhance the learning environment the use of posters, models and videos may be added. At the conclusion of this workshop, patients will be able to demonstrate knowledge of the importance of sleep to overall health, well-being, and quality of life. They will understand the symptoms of, and treatments for, common sleep disorders. Patients will also be able to identify daytime and nighttime behaviors which impact sleep, and they will be able to apply these tools to help manage sleep-related challenges. The purpose of this lesson is to provide patients with a general overview of sleep and outline the importance of quality sleep. Patients will learn about a few of the most common sleep disorders. Patients will also be introduced to the concept of "sleep hygiene," and discover ways to self-manage certain sleeping problems through simple daily behavior changes. Finally, the workshop will motivate patients by clarifying the links between quality sleep and their goals of heart-healthy living.   Recognizing and Reducing Stress Clinical staff led group instruction and group discussion with PowerPoint presentation and patient guidebook. To enhance the learning environment the use of posters, models and videos may be added. At the conclusion of this workshop, patients will be able to understand the types of stress reactions, differentiate between acute and chronic stress, and recognize the impact that chronic stress has on their health. They  will also be able to apply different coping mechanisms, such as reframing negative self-talk. Patients will have the opportunity to practice a variety of stress management techniques, such as deep abdominal breathing, progressive muscle relaxation, and/or guided imagery.  The purpose of this lesson is to educate patients on the role of stress in their lives and to provide healthy techniques for coping with it.  Learning Barriers/Preferences:  Learning Barriers/Preferences - 12/06/22 1205       Learning Barriers/Preferences   Learning Barriers Exercise Concerns;Sight;Hearing   Wears glasses, hearing loss does not have a hearing aide at this time.  fell 2 weeks ago has problems with balance due to back problems   Learning Preferences Individual Instruction;Pictoral;Skilled Demonstration;Verbal Instruction;Written Material;Audio             Education Topics:  Knowledge Questionnaire Score:  Knowledge Questionnaire Score - 12/06/22 1126       Knowledge Questionnaire Score   Pre Score 26/28             Core Components/Risk Factors/Patient Goals at Admission:  Personal Goals and Risk Factors at Admission - 12/06/22 1118       Core Components/Risk Factors/Patient Goals on Admission   Tobacco Cessation Yes    Number of packs per day 1/2 a cigarette / week    Intervention Assist the participant in steps to quit. Provide individualized education and counseling about committing to Tobacco Cessation, relapse prevention, and pharmacological support that can be provided by physician.;Education officer, environmental, assist with locating and accessing local/national Quit Smoking programs, and support quit date choice.    Expected Outcomes Short Term: Will demonstrate readiness to quit, by selecting a quit date.;Long Term: Complete abstinence from all tobacco products for at least 12 months from quit date.;Short Term: Will quit all tobacco product use, adhering to prevention of relapse plan.     Hypertension Yes    Intervention Provide education on lifestyle modifcations including regular physical activity/exercise, weight management, moderate sodium restriction and increased consumption of fresh fruit, vegetables, and low fat dairy, alcohol moderation, and smoking cessation.;Monitor prescription use compliance.    Expected Outcomes Short Term: Continued assessment and intervention until BP is < 140/74mm HG in hypertensive participants. < 130/72mm HG in hypertensive participants with diabetes, heart failure or chronic kidney disease.;Long Term: Maintenance of blood pressure at goal levels.    Lipids Yes    Intervention Provide education and support for participant on nutrition & aerobic/resistive exercise along with prescribed medications to achieve LDL 70mg , HDL >40mg .    Expected Outcomes Short Term: Participant states understanding of desired cholesterol values and is compliant with medications prescribed. Participant is following exercise prescription and nutrition guidelines.;Long Term: Cholesterol controlled with medications as prescribed, with individualized exercise RX and with personalized nutrition plan. Value goals: LDL < 70mg , HDL > 40 mg.    Stress Yes    Intervention Offer individual and/or small group education and counseling on adjustment to heart disease, stress management and health-related lifestyle change. Teach and support self-help strategies.;Refer participants experiencing significant psychosocial distress to appropriate mental health specialists for further evaluation and treatment. When possible, include family members and significant others in education/counseling sessions.    Expected Outcomes Short Term: Participant demonstrates changes in health-related behavior, relaxation and other stress management skills, ability to obtain effective social support, and compliance with psychotropic medications if prescribed.;Long Term: Emotional wellbeing is indicated by absence of  clinically significant psychosocial distress or social isolation.    Personal Goal Other Yes    Personal Goal Be able to walk longer and more briskly. Gain confidence with exercise.    Intervention Develop an exercise action plan that Ija can do at home to help build stamina and aerobic fitness, so that she can walk longer and more briskly.    Expected Outcomes Raimi will be more confident with her exercise routine, and she will be able to walk longer and faster as measured by self-report and 6-minute walk test.             Core Components/Risk Factors/Patient Goals Review:    Core Components/Risk Factors/Patient Goals at Discharge (  Final Review):    ITP Comments:  ITP Comments     Row Name 12/06/22 1013           ITP Comments Medical Director- Dr. Armanda Magic, MD. Introduction to the [facility] / Intensive Cardiac Rehab. Reviewed intial orientation folder.                Comments: Participant attended orientation for the cardiac rehabilitation program on  12/06/2022  to perform initial intake and exercise walk test. Patient introduced to the [facility] education and orientation packet was reviewed. Completed 6-minute walk test, measurements, initial ITP, and exercise prescription. Vital signs stable. Telemetry-normal sinus rhythm, with T-wave inversion, asymptomatic during and after walk test. As Ruthmarie was exiting, she said she was mildly short of breath, discussed with nurse navigator. Symptoms resolved with rest, and she was asymptomatic at exit.   Service time was from 1013 to 1224. Artist Pais, MS, ACSM CEP 12/06/22 1252

## 2022-12-12 ENCOUNTER — Ambulatory Visit (HOSPITAL_COMMUNITY): Payer: Medicare Other

## 2022-12-12 ENCOUNTER — Telehealth (HOSPITAL_COMMUNITY): Payer: Self-pay | Admitting: *Deleted

## 2022-12-12 NOTE — Telephone Encounter (Signed)
Wendy Lopez returned call. Stated that she was in her back yard on Saturday and stepped in a devot  with her right leg which is chronically weaker than the left which caused her to fall. Denies hitting her head or any symptoms prior.  Husband at  home who helped her up.  Confirmed she did not seek medical attention as this is a chronic issue and nothing new.   While she was falling she twist her back. Prior procedures by neurosurgeon to the lumbar region of her back.  Chronic discomfort - provider aware. Continuing to feel better today  less spasm to her leg when walking however she did not feel she could pedal a bike.  Plans to return on Wednesday if she continues to improve. Alanson Aly, BSN Cardiac and Emergency planning/management officer

## 2022-12-12 NOTE — Telephone Encounter (Signed)
Pt called and spoke with front desk staff advising that she will be out today due to falling over the weekend.  Complaint of "muscle spasms in her back".  Looked to see if she sought any medical treatment.  I do not see any notation in [facility] or [facility]where. Pt primary MD is an [facility] provider.  Called for more detailed clinical information and left message requesting that she call us. Contact information left on phone. Alanson Aly, BSN Cardiac and Emergency planning/management officer

## 2022-12-14 ENCOUNTER — Encounter (HOSPITAL_COMMUNITY)
Admission: RE | Admit: 2022-12-14 | Discharge: 2022-12-14 | Disposition: A | Discharge status: Home / Self Care | Payer: Medicare Other | Source: Ambulatory Visit | Attending: Cardiovascular Disease | Admitting: Cardiovascular Disease

## 2022-12-14 DIAGNOSIS — I2111 ST elevation (STEMI) myocardial infarction involving right coronary artery: Secondary | ICD-10-CM

## 2022-12-14 DIAGNOSIS — Z955 Presence of coronary angioplasty implant and graft: Secondary | ICD-10-CM

## 2022-12-14 NOTE — Progress Notes (Addendum)
Cardiac Individual Treatment Plan  Patient Details  Name: Wendy Lopez MRN: [id number] Date of Birth: [birth year] Referring Provider:   Flowsheet Row INTENSIVE CARDIAC REHAB ORIENT from 12/06/2022 in [facility]  Referring Provider Lennette Bihari, MD       Initial Encounter Date:  Flowsheet Row INTENSIVE CARDIAC REHAB ORIENT from 12/06/2022 in [facility]  Date 12/06/22       Visit Diagnosis: 10/23/22 STEMI  10/23/22 PCI / DES RCA  Patient's Home Medications on Admission:  Current Outpatient Medications:    aspirin EC 81 MG tablet, Take 1 tablet (81 mg total) by mouth daily. Swallow whole., Disp: 120 tablet, Rfl: 3   atorvastatin (LIPITOR) 80 MG tablet, Take 1 tablet (80 mg total) by mouth daily. (Patient taking differently: Take 80 mg by mouth daily. Pt says takes have pt tab 40mg ), Disp: 90 tablet, Rfl: 1   Cyanocobalamin (VITAMIN B-12) 3000 MCG/ML LIQD, Inject 3,000 mcg as directed every 30 (thirty) days., Disp: , Rfl:    EPINEPHrine 0.3 mg/0.3 mL IJ SOAJ injection, Inject 0.3 mg into the muscle as needed for anaphylaxis., Disp: , Rfl:    gabapentin (NEURONTIN) 100 MG capsule, Take 1 capsule (100 mg total) by mouth at bedtime. (Patient not taking: Reported on 11/30/2022), Disp: 30 capsule, Rfl: 0   isosorbide mononitrate (IMDUR) 30 MG 24 hr tablet, Take 0.5 tablets (15 mg total) by mouth daily., Disp: 45 tablet, Rfl: 1   levETIRAcetam (KEPPRA) 750 MG tablet, Take 1 tablet (750 mg total) by mouth 2 (two) times daily., Disp: 60 tablet, Rfl: 11   losartan (COZAAR) 100 MG tablet, Take 1 tablet (100 mg total) by mouth daily. (Patient not taking: Reported on 11/30/2022), Disp: 30 tablet, Rfl: 1   metoprolol succinate (TOPROL-XL) 100 MG 24 hr tablet, TAKE 1 TABLET(100 MG) BY MOUTH DAILY WITH OR IMMEDIATELY FOLLOWING A MEAL, Disp: 90 tablet, Rfl: 3   nicotine (NICODERM CQ -  DOSED IN MG/24 HOURS) 14 mg/24hr patch, Place 1 patch (14 mg total) onto the skin daily. (Patient not taking: Reported on 11/30/2022), Disp: 30 patch, Rfl: 0   nitroGLYCERIN (NITROSTAT) 0.4 MG SL tablet, Place 1 tablet (0.4 mg total) under the tongue every 5 (five) minutes as needed., Disp: 25 tablet, Rfl: 2   ondansetron (ZOFRAN-ODT) 4 MG disintegrating tablet, Take 1 tablet (4 mg total) by mouth every 8 (eight) hours as needed for nausea or vomiting. (Patient not taking: Reported on 12/06/2022), Disp: 20 tablet, Rfl: 0   sodium chloride (OCEAN) 0.65 % nasal spray, Place 1-2 sprays into the nose as needed for congestion., Disp: , Rfl:    ticagrelor (BRILINTA) 90 MG TABS tablet, Take 1 tablet (90 mg total) by mouth 2 (two) times daily., Disp: 180 tablet, Rfl: 2   Tiotropium Bromide-Olodaterol (STIOLTO RESPIMAT) 2.5-2.5 MCG/ACT AERS, Inhale 2 puffs into the lungs daily. (Patient not taking: Reported on 11/30/2022), Disp: 4 g, Rfl: 5   ZYRTEC ALLERGY 10 MG tablet, Take 10 mg by mouth at bedtime as needed for allergies., Disp: , Rfl:   Past Medical History: Past Medical History:  Diagnosis Date   Anxiety and depression    Arthritis    Breast cancer (HCC) 02/13/2009   Cancer of right breast (HCC) 04/16/2012   Coronary artery disease    Depression    Depression 04/18/2012   Dysrhythmia    palpitations   Epilepsy (HCC)  last seizure 1990's   H/O hiatal hernia    Hyperlipidemia    Hypertension    Localization-related (focal) (partial) epilepsy and epileptic syndromes with complex partial seizures, without mention of intractable epilepsy 02/07/2013   Memory deficit 02/07/2013   Memory loss    Mitral valve prolapse 06/01/2010   echo- EF>55% mild tricupsid regurgitation. There is mild Pulmonary Hypertension.. Right  ventricular systolic pressure iselevated at 30-40 mmHg. LV systolic function is normal.   Osteopenia    PONV (postoperative nausea and vomiting)    SOB (shortness of breath)  10/18/2011   met-test   Spinal fusion failure    3    Tobacco Use: Social History   Tobacco Use  Smoking Status Some Days   Current packs/day: 1.00   Average packs/day: 1 pack/day for 43.0 years (43.0 ttl pk-yrs)   Types: Cigarettes  Smokeless Tobacco Never  Tobacco Comments   Smokes a 1/2 of a cigarette a week. Has talked to [facility]. Offered virtual cessation classes declined. 12/06/22 MWW RN    Labs: Review Flowsheet       Latest Ref Rng & Units 02/17/2017 06/14/2022 10/23/2022 10/26/2022  Labs for ITP Cardiac and Pulmonary Rehab  Cholestrol 0 - 200 mg/dL 161  096  045  409   LDL (calc) 0 - 99 mg/dL 87  811  914  782   HDL-C >40 mg/dL 70  66  64  70   Trlycerides <150 mg/dL 65  71  83  71   Hemoglobin A1c 4.8 - 5.6 % - - 5.5  5.6   TCO2 22 - 32 mmol/L - - 24  -    Details            Capillary Blood Glucose: No results found for: "GLUCAP"   Exercise Target Goals: Exercise Program Goal: Individual exercise prescription set using results from initial 6 min walk test and THRR while considering  patient's activity barriers and safety.   Exercise Prescription Goal: Initial exercise prescription builds to 30-45 minutes a day of aerobic activity, 2-3 days per week.  Home exercise guidelines will be given to patient during program as part of exercise prescription that the participant will acknowledge.  Activity Barriers & Risk Stratification:  Activity Barriers & Cardiac Risk Stratification - 12/06/22 1117       Activity Barriers & Cardiac Risk Stratification   Activity Barriers Neck/Spine Problems;Balance Concerns;History of Falls;Other (comment);Arthritis;Back Problems;Chest Pain/Angina;Shortness of Breath    Comments Bulging disc- neck pain; seizure disorder-controlled; no cartilage in right knee-pain; lumbar spine fusion-back surgery x3    Cardiac Risk Stratification High             6 Minute Walk:  6 Minute Walk     Row Name 12/06/22 1200         6  Minute Walk   Phase Initial     Distance 1129 feet     Walk Time 6 minutes     # of Rest Breaks 0     MPH 2.14     METS 3.08     RPE 10.5     Perceived Dyspnea  0     VO2 Peak 10.77     Symptoms Yes (comment)     Comments Right knee and ankle pain- chronic, 1/10 on pain scale.     Resting HR 72 bpm     Resting BP 118/80     Resting Oxygen Saturation  97 %     Exercise Oxygen Saturation  during 6 min walk 95 %     Max Ex. HR 86 bpm     Max Ex. BP 148/80     2 Minute Post BP 128/80              Oxygen Initial Assessment:   Oxygen Re-Evaluation:   Oxygen Discharge (Final Oxygen Re-Evaluation):   Initial Exercise Prescription:  Initial Exercise Prescription - 12/06/22 1100       Date of Initial Exercise RX and Referring Provider   Date 12/06/22    Referring Provider Lennette Bihari, MD    Expected Discharge Date 03/01/23      Recumbant Bike   Level 1    Watts 20    Minutes 15    METs 1.5      NuStep   Level 1    SPM 85    Minutes 15    METs 1.5      Prescription Details   Frequency (times per week) 2    Duration Progress to 30 minutes of continuous aerobic without signs/symptoms of physical distress      Intensity   THRR 40-80% of Max Heartrate 63-126    Ratings of Perceived Exertion 11-13    Perceived Dyspnea 0-4      Progression   Progression Continue to progress workloads to maintain intensity without signs/symptoms of physical distress.      Resistance Training   Training Prescription Yes    Weight 2 lbs    Reps 10-15             Perform Capillary Blood Glucose checks as needed.  Exercise Prescription Changes:   Exercise Prescription Changes     Row Name 12/14/22 1609             Response to Exercise   Blood Pressure (Admit) 110/70       Blood Pressure (Exercise) 126/68       Blood Pressure (Exit) 106/60       Heart Rate (Admit) 75 bpm       Heart Rate (Exercise) 107 bpm       Heart Rate (Exit) 74 bpm       Rating of  Perceived Exertion (Exercise) 12       Perceived Dyspnea (Exercise) 0       Symptoms 0       Comments Pt first day in [facility] ICR       Duration Progress to 30 minutes of  aerobic without signs/symptoms of physical distress       Intensity THRR unchanged         Progression   Progression Continue to progress workloads to maintain intensity without signs/symptoms of physical distress.       Average METs 1.8         Resistance Training   Training Prescription No         Recumbant Bike   Level 1       RPM 55       Watts 12       Minutes 15       METs 2         NuStep   Level 1       SPM 63       Minutes 15       METs 1.6                Exercise Comments:   Exercise Comments     Row Name 12/14/22 [phone]  Exercise Comments Pt first day in the [facility] ICR program. Pt tolerated exercise well with an average MET level of 1.8. Pt is learning her THRR, RPE and ExRx                Exercise Goals and Review:   Exercise Goals     Row Name 12/06/22 1118             Exercise Goals   Increase Physical Activity Yes       Intervention Provide advice, education, support and counseling about physical activity/exercise needs.;Develop an individualized exercise prescription for aerobic and resistive training based on initial evaluation findings, risk stratification, comorbidities and participant's personal goals.       Expected Outcomes Short Term: Attend rehab on a regular basis to increase amount of physical activity.;Long Term: Add in home exercise to make exercise part of routine and to increase amount of physical activity.;Long Term: Exercising regularly at least 3-5 days a week.       Increase Strength and Stamina Yes       Intervention Provide advice, education, support and counseling about physical activity/exercise needs.;Develop an individualized exercise prescription for aerobic and resistive training based on initial evaluation findings, risk  stratification, comorbidities and participant's personal goals.       Expected Outcomes Short Term: Increase workloads from initial exercise prescription for resistance, speed, and METs.;Short Term: Perform resistance training exercises routinely during rehab and add in resistance training at home;Long Term: Improve cardiorespiratory fitness, muscular endurance and strength as measured by increased METs and functional capacity ( )       Able to understand and use rate of perceived exertion (RPE) scale Yes       Intervention Provide education and explanation on how to use RPE scale       Expected Outcomes Short Term: Able to use RPE daily in rehab to express subjective intensity level;Long Term:  Able to use RPE to guide intensity level when exercising independently       Knowledge and understanding of Target Heart Rate Range (THRR) Yes       Intervention Provide education and explanation of THRR including how the numbers were predicted and where they are located for reference       Expected Outcomes Short Term: Able to state/look up THRR;Long Term: Able to use THRR to govern intensity when exercising independently;Short Term: Able to use daily as guideline for intensity in rehab       Able to check pulse independently Yes       Intervention Provide education and demonstration on how to check pulse in carotid and radial arteries.;Review the importance of being able to check your own pulse for safety during independent exercise       Expected Outcomes Short Term: Able to explain why pulse checking is important during independent exercise;Long Term: Able to check pulse independently and accurately       Understanding of Exercise Prescription Yes       Intervention Provide education, explanation, and written materials on patient's individual exercise prescription       Expected Outcomes Short Term: Able to explain program exercise prescription;Long Term: Able to explain home exercise prescription to  exercise independently                Exercise Goals Re-Evaluation :  Exercise Goals Re-Evaluation     Row Name 12/14/22 1611             Exercise Goal Re-Evaluation  Exercise Goals Review Increase Physical Activity;Understanding of Exercise Prescription;Increase Strength and Stamina;Knowledge and understanding of Target Heart Rate Range (THRR);Able to understand and use rate of perceived exertion (RPE) scale       Comments Pt first day in the [facility] I[facility] program. Pt tolerated exercise well with an average MET level of 1.8. Pt is learning her THRR, RPE and ExRx       Expected Outcomes Will continue to monitor pt and progress workloads as tolerated without sign or symptom                Discharge Exercise Prescription (Final Exercise Prescription Changes):  Exercise Prescription Changes - 12/14/22 1609       Response to Exercise   Blood Pressure (Admit) 110/70    Blood Pressure (Exercise) 126/68    Blood Pressure (Exit) 106/60    Heart Rate (Admit) 75 bpm    Heart Rate (Exercise) 107 bpm    Heart Rate (Exit) 74 bpm    Rating of Perceived Exertion (Exercise) 12    Perceived Dyspnea (Exercise) 0    Symptoms 0    Comments Pt first day in [facility]    Duration Progress to 30 minutes of  aerobic without signs/symptoms of physical distress    Intensity THRR unchanged      Progression   Progression Continue to progress workloads to maintain intensity without signs/symptoms of physical distress.    Average METs 1.8      Resistance Training   Training Prescription No      Recumbant Bike   Level 1    RPM 55    Watts 12    Minutes 15    METs 2      NuStep   Level 1    SPM 63    Minutes 15    METs 1.6             Nutrition:  Target Goals: Understanding of nutrition guidelines, daily intake of sodium 1500mg , cholesterol 200mg , calories 30% from fat and 7% or less from saturated fats, daily to have 5 or more servings of fruits and  vegetables.  Biometrics:  Pre Biometrics - 12/06/22 1013       Pre Biometrics   Waist Circumference 32 inches    Hip Circumference 40 inches    Waist to Hip Ratio 0.8 %    Triceps Skinfold 28 mm    % Body Fat 36.2 %    Grip Strength 22 kg    Flexibility --   Not performed, lumbar spine fusion.   Single Leg Stand 5.75 seconds              Nutrition Therapy Plan and Nutrition Goals:  Nutrition Therapy & Goals - 12/14/22 1552       Nutrition Therapy   Diet Heart Healthy Diet    Drug/Food Interactions Statins/Certain Fruits      Personal Nutrition Goals   Nutrition Goal Patient to identify strategies for reducing cardiovascular risk by attending the [facility] education and nutrition series weekly.    Personal Goal #2 Patient to improve diet quality by using the plate method as a guide for meal planning to include lean protein/plant protein, fruits, vegetables, whole grains, nonfat dairy as part of a well-balanced diet    Personal Goal #3 Patient to reduce sodium to 1500mg  per day    Comments Wendy Lopez has medical history of breast cancer, STEMI, HTN, tobacco abuse. Patient will benefit from participation in intensive cardiac rehab for  nutrition, exercise, and lifestyle modification.      Intervention Plan   Intervention Prescribe, educate and counsel regarding individualized specific dietary modifications aiming towards targeted core components such as weight, hypertension, lipid management, diabetes, heart failure and other comorbidities.;Nutrition handout(s) given to patient.    Expected Outcomes Short Term Goal: Understand basic principles of dietary content, such as calories, fat, sodium, cholesterol and nutrients.;Long Term Goal: Adherence to prescribed nutrition plan.             Nutrition Assessments:  MEDIFICTS Score Key: ?70 Need to make dietary changes  40-70 Heart Healthy Diet ? 40 Therapeutic Level Cholesterol Diet   Flowsheet Row INTENSIVE CARDIAC REHAB  from 12/14/2022 in [facility]  Picture Your Plate Total Score on Admission 77      Picture Your Plate Scores: <16 Unhealthy dietary pattern with much room for improvement. 41-50 Dietary pattern unlikely to meet recommendations for good health and room for improvement. 51-60 More healthful dietary pattern, with some room for improvement.  >60 Healthy dietary pattern, although there may be some specific behaviors that could be improved.    Nutrition Goals Re-Evaluation:  Nutrition Goals Re-Evaluation     Row Name 12/14/22 1552             Goals   Current Weight 145 lb 1 oz (65.8 kg)       Comment LDL 113, A1c WNL, AST 41, ALT 42       Expected Outcome Wendy Lopez has medical history of breast cancer, STEMI, HTN, tobacco abuse. Patient will benefit from participation in intensive cardiac rehab for nutrition, exercise, and lifestyle modification.                Nutrition Goals Re-Evaluation:  Nutrition Goals Re-Evaluation     Row Name 12/14/22 1552             Goals   Current Weight 145 lb 1 oz (65.8 kg)       Comment LDL 113, A1c WNL, AST 41, ALT 42       Expected Outcome Wendy Lopez has medical history of breast cancer, STEMI, HTN, tobacco abuse. Patient will benefit from participation in intensive cardiac rehab for nutrition, exercise, and lifestyle modification.                Nutrition Goals Discharge (Final Nutrition Goals Re-Evaluation):  Nutrition Goals Re-Evaluation - 12/14/22 1552       Goals   Current Weight 145 lb 1 oz (65.8 kg)    Comment LDL 113, A1c WNL, AST 41, ALT 42    Expected Outcome Wendy Lopez has medical history of breast cancer, STEMI, HTN, tobacco abuse. Patient will benefit from participation in intensive cardiac rehab for nutrition, exercise, and lifestyle modification.             Psychosocial: Target Goals: Acknowledge presence or absence of significant depression and/or stress,  maximize coping skills, provide positive support system. Participant is able to verbalize types and ability to use techniques and skills needed for reducing stress and depression.  Initial Review & Psychosocial Screening:  Initial Psych Review & Screening - 12/06/22 1124       Initial Review   Current issues with History of Depression;Current Depression;Current Stress Concerns;Current Sleep Concerns    Source of Stress Concerns Chronic Illness;Unable to perform yard/household activities;Unable to participate in former interests or hobbies    Comments Wendy Lopez says she has had some difficulty sleeping due to  taking new medication isosorbide. Wendy Lopez had stress related to recent MI stenitng, continued pain.      Family Dynamics   Good Support System? Yes   Wendy Lopez has her husband, son, siblings and church family for support     Barriers   Psychosocial barriers to participate in program The patient should benefit from training in stress management and relaxation.      Screening Interventions   Interventions Encouraged to exercise    Expected Outcomes Long Term Goal: Stressors or current issues are controlled or eliminated.;Short Term goal: Utilizing psychosocial counselor, staff and physician to assist with identification of specific Stressors or current issues interfering with healing process. Setting desired goal for each stressor or current issue identified.;Long Term goal: The participant improves quality of Life and PHQ9 Scores as seen by post scores and/or verbalization of changes;Short Term goal: Identification and review with participant of any Quality of Life or Depression concerns found by scoring the questionnaire.             Quality of Life Scores:  Quality of Life - 12/06/22 1121       Quality of Life   Select Quality of Life      Quality of Life Scores   Health/Function Pre 25.6 %    Socioeconomic Pre 28 %    Psych/Spiritual Pre 27.07 %    Family Pre 30 %    GLOBAL  Pre 27.04 %            Scores of 19 and below usually indicate a poorer quality of life in these areas.  A difference of  2-3 points is a clinically meaningful difference.  A difference of 2-3 points in the total score of the Quality of Life Index has been associated with significant improvement in overall quality of life, self-image, physical symptoms, and general health in studies assessing change in quality of life.  PHQ-9: Review Flowsheet       12/06/2022 03/18/2014  Depression screen PHQ 2/9  Decreased Interest 0 0  Down, Depressed, Hopeless 1 0  PHQ - 2 Score 1 0  Altered sleeping 1 -  Tired, decreased energy 2 -  Change in appetite 0 -  Feeling bad or failure about yourself  0 -  Trouble concentrating 1 -  Moving slowly or fidgety/restless 0 -  Suicidal thoughts 0 -  PHQ-9 Score 5 -  Difficult doing work/chores Not difficult at all -    Details           Interpretation of Total Score  Total Score Depression Severity:  1-4 = Minimal depression, 5-9 = Mild depression, 10-14 = Moderate depression, 15-19 = Moderately severe depression, 20-27 = Severe depression   Psychosocial Evaluation and Intervention:   Psychosocial Re-Evaluation:   Psychosocial Discharge (Final Psychosocial Re-Evaluation):   Vocational Rehabilitation: Provide vocational rehab assistance to qualifying candidates.   Vocational Rehab Evaluation & Intervention:  Vocational Rehab - 12/06/22 1211       Initial Vocational Rehab Evaluation & Intervention   Assessment shows need for Vocational Rehabilitation No   Wendy Lopez is disabled and does not need vocational rehab at this time.            Education: Education Goals: Education classes will be provided on a weekly basis, covering required topics. Participant will state understanding/return demonstration of topics presented.    Education     Row Name 12/14/22 1500     Education   Cardiac Education Topics [facility]in   Select  Statistician   Instruction Review Code 1- Teaching laboratory technician Start Time 1400   Class Stop Time 1445   Class Time Calculation (min) 45 min            Core Videos: Exercise    Move It!  Clinical staff conducted group or individual video education with verbal and written material and guidebook.  Patient learns the recommended [facility]in exercise program. Exercise with the goal of living a long, healthy life. Some of the health benefits of exercise include controlled diabetes, healthier blood pressure levels, improved cholesterol levels, improved heart and lung capacity, improved sleep, and better body composition. Everyone should speak with their doctor before starting or changing an exercise routine.  Biomechanical Limitations Clinical staff conducted group or individual video education with verbal and written material and guidebook.  Patient learns how biomechanical limitations can impact exercise and how we can mitigate and possibly overcome limitations to have an impactful and balanced exercise routine.  Body Composition Clinical staff conducted group or individual video education with verbal and written material and guidebook.  Patient learns that body composition (ratio of muscle mass to fat mass) is a key component to assessing overall fitness, rather than body weight alone. Increased fat mass, especially visceral belly fat, can put [place] at increased risk for metabolic syndrome, type 2 diabetes, heart disease, and even death. It is recommended to combine diet and exercise (cardiovascular and resistance training) to improve your body composition. Seek guidance from your physician and exercise physiologist before implementing an exercise routine.  Exercise Action Plan Clinical staff conducted group or individual video education with verbal and written material and guidebook.  Patient learns the  recommended strategies to achieve and enjoy long-term exercise adherence, including variety, self-motivation, self-efficacy, and positive decision making. Benefits of exercise include fitness, good health, weight management, more energy, better sleep, less stress, and overall well-being.  Medical   Heart Disease Risk Reduction Clinical staff conducted group or individual video education with verbal and written material and guidebook.  Patient learns our heart is our most vital organ as it circulates oxygen, nutrients, white blood cells, and hormones throughout the entire body, and carries waste away. Data supports a plant-based eating plan like the Pritikin Program for its effectiveness in slowing progression of and reversing heart disease. The video provides a number of recommendations to address heart disease.   Metabolic Syndrome and Belly Fat  Clinical staff conducted group or individual video education with verbal and written material and guidebook.  Patient learns what metabolic syndrome is, how it leads to heart disease, and how one can reverse it and keep it from coming back. You have metabolic syndrome if you have 3 of the following 5 criteria: abdominal obesity, high blood pressure, high triglycerides, low HDL cholesterol, and high blood sugar.  Hypertension and Heart Disease Clinical staff conducted group or individual video education with verbal and written material and guidebook.  Patient learns that high blood pressure, or hypertension, is very common in the [place]. Hypertension is largely due to excessive salt intake, but other important risk factors include being overweight, physical inactivity, drinking too much alcohol, smoking, and not eating enough potassium from fruits and vegetables. High blood pressure is a leading risk factor for heart attack, stroke, congestive heart failure, dementia, kidney failure, and premature death. Long-term effects of excessive salt intake include  stiffening  of the arteries and thickening of heart muscle and organ damage. Recommendations include ways to reduce hypertension and the risk of heart disease.  Diseases of Our Time - Focusing on Diabetes Clinical staff conducted group or individual video education with verbal and written material and guidebook.  Patient learns why the best way to stop diseases of our time is prevention, through food and other lifestyle changes. Medicine (such as prescription pills and surgeries) is often only a Band-Aid on the problem, not a long-term solution. Most common diseases of our time include obesity, type 2 diabetes, hypertension, heart disease, and cancer. The Pritikin Program is recommended and has been proven to help reduce, reverse, and/or prevent the damaging effects of metabolic syndrome.  Nutrition   Overview of the Pritikin Eating Plan  Clinical staff conducted group or individual video education with verbal and written material and guidebook.  Patient learns about the Pritikin Eating Plan for disease risk reduction. The Pritikin Eating Plan emphasizes a wide variety of unrefined, minimally-processed carbohydrates, like fruits, vegetables, whole grains, and legumes. Go, Caution, and Stop food choices are explained. Plant-based and lean animal proteins are emphasized. Rationale provided for low sodium intake for blood pressure control, low added sugars for blood sugar stabilization, and low added fats and oils for coronary artery disease risk reduction and weight management.  Calorie Density  Clinical staff conducted group or individual video education with verbal and written material and guidebook.  Patient learns about calorie density and how it impacts the Pritikin Eating Plan. Knowing the characteristics of the food you choose will help you decide whether those foods will lead to weight gain or weight loss, and whether you want to consume more or less of them. Weight loss is usually a side effect of  the Pritikin Eating Plan because of its focus on low calorie-dense foods.  Label Reading  Clinical staff conducted group or individual video education with verbal and written material and guidebook.  Patient learns about the Pritikin recommended label reading guidelines and corresponding recommendations regarding calorie density, added sugars, sodium content, and whole grains.  Dining Out - Part 1  Clinical staff conducted group or individual video education with verbal and written material and guidebook.  Patient learns that restaurant meals can be sabotaging because they can be so high in calories, fat, sodium, and/or sugar. Patient learns recommended strategies on how to positively address this and avoid unhealthy pitfalls.  Facts on Fats  Clinical staff conducted group or individual video education with verbal and written material and guidebook.  Patient learns that lifestyle modifications can be just as effective, if not more so, as many medications for lowering your risk of heart disease. A Pritikin lifestyle can help to reduce your risk of inflammation and atherosclerosis (cholesterol build-up, or plaque, in the artery walls). Lifestyle interventions such as dietary choices and physical activity address the cause of atherosclerosis. A review of the types of fats and their impact on blood cholesterol levels, along with dietary recommendations to reduce fat intake is also included.  Nutrition Action Plan  Clinical staff conducted group or individual video education with verbal and written material and guidebook.  Patient learns how to incorporate Pritikin recommendations into their lifestyle. Recommendations include planning and keeping personal health goals in mind as an important part of their success.  Healthy Mind-Set    Healthy Minds, Bodies, Hearts  Clinical staff conducted group or individual video education with verbal and written material and guidebook.  Patient learns how to  identify when they are stressed. Video will discuss the impact of that stress, as well as the many benefits of stress management. Patient will also be introduced to stress management techniques. The way we think, act, and feel has an impact on our hearts.  How Our Thoughts Can Heal Our Hearts  Clinical staff conducted group or individual video education with verbal and written material and guidebook.  Patient learns that negative thoughts can cause depression and anxiety. This can result in negative lifestyle behavior and serious health problems. Cognitive behavioral therapy is an effective method to help control our thoughts in order to change and improve our emotional outlook.  Additional Videos:  Exercise    Improving Performance  Clinical staff conducted group or individual video education with verbal and written material and guidebook.  Patient learns to use a non-linear approach by alternating intensity levels and lengths of time spent exercising to help burn more calories and lose more body fat. Cardiovascular exercise helps improve heart health, metabolism, hormonal balance, blood sugar control, and recovery from fatigue. Resistance training improves strength, endurance, balance, coordination, reaction time, metabolism, and muscle mass. Flexibility exercise improves circulation, posture, and balance. Seek guidance from your physician and exercise physiologist before implementing an exercise routine and learn your capabilities and proper form for all exercise.  Introduction to Yoga  Clinical staff conducted group or individual video education with verbal and written material and guidebook.  Patient learns about yoga, a discipline of the coming together of mind, breath, and body. The benefits of yoga include improved flexibility, improved range of motion, better posture and core strength, increased lung function, weight loss, and positive self-image. Yoga's heart health benefits include lowered  blood pressure, healthier heart rate, decreased cholesterol and triglyceride levels, improved immune function, and reduced stress. Seek guidance from your physician and exercise physiologist before implementing an exercise routine and learn your capabilities and proper form for all exercise.  Medical   Aging: Enhancing Your Quality of Life  Clinical staff conducted group or individual video education with verbal and written material and guidebook.  Patient learns key strategies and recommendations to stay in good physical health and enhance quality of life, such as prevention strategies, having an advocate, securing a Health Care Proxy and Power of Attorney, and keeping a list of medications and system for tracking them. It also discusses how to avoid risk for bone loss.  Biology of Weight Control  Clinical staff conducted group or individual video education with verbal and written material and guidebook.  Patient learns that weight gain occurs because we consume more calories than we burn (eating more, moving less). Even if your body weight is normal, you may have higher ratios of fat compared to muscle mass. Too much body fat puts you at increased risk for cardiovascular disease, heart attack, stroke, type 2 diabetes, and obesity-related cancers. In addition to exercise, following the Pritikin Eating Plan can help reduce your risk.  Decoding Lab Results  Clinical staff conducted group or individual video education with verbal and written material and guidebook.  Patient learns that lab test reflects one measurement whose values change over time and are influenced by many factors, including medication, stress, sleep, exercise, food, hydration, pre-existing medical conditions, and more. It is recommended to use the knowledge from this video to become more involved with your lab results and evaluate your numbers to speak with your doctor.   Diseases of Our Time - Overview  Clinical staff conducted  group or individual video  education with verbal and written material and guidebook.  Patient learns that according to the CDC, 50% to 70% of chronic diseases (such as obesity, type 2 diabetes, elevated lipids, hypertension, and heart disease) are avoidable through lifestyle improvements including healthier food choices, listening to satiety cues, and increased physical activity.  Sleep Disorders Clinical staff conducted group or individual video education with verbal and written material and guidebook.  Patient learns how good quality and duration of sleep are important to overall health and well-being. Patient also learns about sleep disorders and how they impact health along with recommendations to address them, including discussing with a physician.  Nutrition  Dining Out - Part 2 Clinical staff conducted group or individual video education with verbal and written material and guidebook.  Patient learns how to plan ahead and communicate in order to maximize their dining experience in a healthy and nutritious manner. Included are recommended food choices based on the type of restaurant the patient is visiting.   Fueling a Banker conducted group or individual video education with verbal and written material and guidebook.  There is a strong connection between our food choices and our health. Diseases like obesity and type 2 diabetes are very prevalent and are in large-part due to lifestyle choices. The Pritikin Eating Plan provides plenty of food and hunger-curbing satisfaction. It is easy to follow, affordable, and helps reduce health risks.  Menu Workshop  Clinical staff conducted group or individual video education with verbal and written material and guidebook.  Patient learns that restaurant meals can sabotage health goals because they are often packed with calories, fat, sodium, and sugar. Recommendations include strategies to plan ahead and to communicate with the  manager, chef, or server to help order a healthier meal.  Planning Your Eating Strategy  Clinical staff conducted group or individual video education with verbal and written material and guidebook.  Patient learns about the Pritikin Eating Plan and its benefit of reducing the risk of disease. The Pritikin Eating Plan does not focus on calories. Instead, it emphasizes high-quality, nutrient-rich foods. By knowing the characteristics of the foods, we choose, we can determine their calorie density and make informed decisions.  Targeting Your Nutrition Priorities  Clinical staff conducted group or individual video education with verbal and written material and guidebook.  Patient learns that lifestyle habits have a tremendous impact on disease risk and progression. This video provides eating and physical activity recommendations based on your personal health goals, such as reducing LDL cholesterol, losing weight, preventing or controlling type 2 diabetes, and reducing high blood pressure.  Vitamins and Minerals  Clinical staff conducted group or individual video education with verbal and written material and guidebook.  Patient learns different ways to obtain key vitamins and minerals, including through a recommended healthy diet. It is important to discuss all supplements you take with your doctor.   Healthy Mind-Set    Smoking Cessation  Clinical staff conducted group or individual video education with verbal and written material and guidebook.  Patient learns that cigarette smoking and tobacco addiction pose a serious health risk which affects millions of people. Stopping smoking will significantly reduce the risk of heart disease, lung disease, and many forms of cancer. Recommended strategies for quitting are covered, including working with your doctor to develop a successful plan.  Culinary   Becoming a Set designer conducted group or individual video education with verbal and  written material and guidebook.  Patient learns that  cooking at home can be healthy, cost-effective, quick, and puts them in control. Keys to cooking healthy recipes will include looking at your recipe, assessing your equipment needs, planning ahead, making it simple, choosing cost-effective seasonal ingredients, and limiting the use of added fats, salts, and sugars.  Cooking - Breakfast and Snacks  Clinical staff conducted group or individual video education with verbal and written material and guidebook.  Patient learns how important breakfast is to satiety and nutrition through the entire day. Recommendations include key foods to eat during breakfast to help stabilize blood sugar levels and to prevent overeating at meals later in the day. Planning ahead is also a key component.  Cooking - Educational psychologist conducted group or individual video education with verbal and written material and guidebook.  Patient learns eating strategies to improve overall health, including an approach to cook more at home. Recommendations include thinking of animal protein as a side on your plate rather than center stage and focusing instead on lower calorie dense options like vegetables, fruits, whole grains, and plant-based proteins, such as beans. Making sauces in large quantities to freeze for later and leaving the skin on your vegetables are also recommended to maximize your experience.  Cooking - Healthy Salads and Dressing Clinical staff conducted group or individual video education with verbal and written material and guidebook.  Patient learns that vegetables, fruits, whole grains, and legumes are the foundations of the Pritikin Eating Plan. Recommendations include how to incorporate each of these in flavorful and healthy salads, and how to create homemade salad dressings. Proper handling of ingredients is also covered. Cooking - Soups and [employer] - Soups and Desserts Clinical staff  conducted group or individual video education with verbal and written material and guidebook.  Patient learns that Pritikin soups and desserts make for easy, nutritious, and delicious snacks and meal components that are low in sodium, fat, sugar, and calorie density, while high in vitamins, minerals, and filling fiber. Recommendations include simple and healthy ideas for soups and desserts.   Overview     The Pritikin Solution Program Overview Clinical staff conducted group or individual video education with verbal and written material and guidebook.  Patient learns that the results of the Pritikin Program have been documented in more than 100 articles published in peer-reviewed journals, and the benefits include reducing risk factors for (and, in some cases, even reversing) high cholesterol, high blood pressure, type 2 diabetes, obesity, and more! An overview of the three key pillars of the Pritikin Program will be covered: eating well, doing regular exercise, and having a healthy mind-set.  WORKSHOPS  Exercise: Exercise Basics: Building Your Action Plan Clinical staff led group instruction and group discussion with PowerPoint presentation and patient guidebook. To enhance the learning environment the use of posters, models and videos may be added. At the conclusion of this workshop, patients will comprehend the difference between physical activity and exercise, as well as the benefits of incorporating both, into their routine. Patients will understand the FITT (Frequency, Intensity, Time, and Type) principle and how to use it to build an exercise action plan. In addition, safety concerns and other considerations for exercise and cardiac rehab will be addressed by the presenter. The purpose of this lesson is to promote a comprehensive and effective weekly exercise routine in order to improve patients' overall level of fitness.   Managing Heart Disease: Your Path to a Healthier Heart Clinical  staff led group instruction and group discussion  with PowerPoint presentation and patient guidebook. To enhance the learning environment the use of posters, models and videos may be added.At the conclusion of this workshop, patients will understand the anatomy and physiology of the heart. Additionally, they will understand how Pritikin's three pillars impact the risk factors, the progression, and the management of heart disease.  The purpose of this lesson is to provide a high-level overview of the heart, heart disease, and how the Pritikin lifestyle positively impacts risk factors.  Exercise Biomechanics Clinical staff led group instruction and group discussion with PowerPoint presentation and patient guidebook. To enhance the learning environment the use of posters, models and videos may be added. Patients will learn how the structural parts of their bodies function and how these functions impact their daily activities, movement, and exercise. Patients will learn how to promote a neutral spine, learn how to manage pain, and identify ways to improve their physical movement in order to promote healthy living. The purpose of this lesson is to expose patients to common physical limitations that impact physical activity. Participants will learn practical ways to adapt and manage aches and pains, and to minimize their effect on regular exercise. Patients will learn how to maintain good posture while sitting, walking, and lifting.  Balance Training and Fall Prevention  Clinical staff led group instruction and group discussion with PowerPoint presentation and patient guidebook. To enhance the learning environment the use of posters, models and videos may be added. At the conclusion of this workshop, patients will understand the importance of their sensorimotor skills (vision, proprioception, and the vestibular system) in maintaining their ability to balance as they age. Patients will apply a variety of  balancing exercises that are appropriate for their current level of function. Patients will understand the common causes for poor balance, possible solutions to these problems, and ways to modify their physical environment in order to minimize their fall risk. The purpose of this lesson is to teach patients about the importance of maintaining balance as they age and ways to minimize their risk of falling.  WORKSHOPS   Nutrition:  Fueling a Ship broker led group instruction and group discussion with PowerPoint presentation and patient guidebook. To enhance the learning environment the use of posters, models and videos may be added. Patients will review the foundational principles of the Pritikin Eating Plan and understand what constitutes a serving size in each of the food groups. Patients will also learn Pritikin-friendly foods that are better choices when away from home and review make-ahead meal and snack options. Calorie density will be reviewed and applied to three nutrition priorities: weight maintenance, weight loss, and weight gain. The purpose of this lesson is to reinforce (in a group setting) the key concepts around what patients are recommended to eat and how to apply these guidelines when away from home by planning and selecting Pritikin-friendly options. Patients will understand how calorie density may be adjusted for different weight management goals.  Mindful Eating  Clinical staff led group instruction and group discussion with PowerPoint presentation and patient guidebook. To enhance the learning environment the use of posters, models and videos may be added. Patients will briefly review the concepts of the Pritikin Eating Plan and the importance of low-calorie dense foods. The concept of mindful eating will be introduced as well as the importance of paying attention to internal hunger signals. Triggers for non-hunger eating and techniques for dealing with triggers will be  explored. The purpose of this lesson is to provide patients  with the opportunity to review the basic principles of the Pritikin Eating Plan, discuss the value of eating mindfully and how to measure internal cues of hunger and fullness using the Hunger Scale. Patients will also discuss reasons for non-hunger eating and learn strategies to use for controlling emotional eating.  Targeting Your Nutrition Priorities Clinical staff led group instruction and group discussion with PowerPoint presentation and patient guidebook. To enhance the learning environment the use of posters, models and videos may be added. Patients will learn how to determine their genetic susceptibility to disease by reviewing their family history. Patients will gain insight into the importance of diet as part of an overall healthy lifestyle in mitigating the impact of genetics and other environmental insults. The purpose of this lesson is to provide patients with the opportunity to assess their personal nutrition priorities by looking at their family history, their own health history and current risk factors. Patients will also be able to discuss ways of prioritizing and modifying the Pritikin Eating Plan for their highest risk areas  Menu  Clinical staff led group instruction and group discussion with PowerPoint presentation and patient guidebook. To enhance the learning environment the use of posters, models and videos may be added. Using menus brought in from [employer], or printed from [employer], patients will apply the Pritikin dining out guidelines that were presented in the [employer] video. Patients will also be able to practice these guidelines in a variety of provided scenarios. The purpose of this lesson is to provide patients with the opportunity to practice hands-on learning of the Pritikin Dining Out guidelines with actual menus and practice scenarios.  Label Reading Clinical staff led group  instruction and group discussion with PowerPoint presentation and patient guidebook. To enhance the learning environment the use of posters, models and videos may be added. Patients will review and discuss the Pritikin label reading guidelines presented in Pritikin's Label Reading Educational series video. Using fool labels brought in from local grocery stores and markets, patients will apply the label reading guidelines and determine if the packaged food meet the Pritikin guidelines. The purpose of this lesson is to provide patients with the opportunity to review, discuss, and practice hands-on learning of the Pritikin Label Reading guidelines with actual packaged food labels. Cooking School  Pritikin's [employer] are designed to teach patients ways to prepare quick, simple, and affordable recipes at home. The importance of nutrition's role in chronic disease risk reduction is reflected in its emphasis in the overall Pritikin program. By learning how to prepare essential core Pritikin Eating Plan recipes, patients will increase control over what they eat; be able to customize the flavor of foods without the use of added salt, sugar, or fat; and improve the quality of the food they consume. By learning a set of core recipes which are easily assembled, quickly prepared, and affordable, patients are more likely to prepare more healthy foods at home. These workshops focus on convenient breakfasts, simple entres, side dishes, and desserts which can be prepared with minimal effort and are consistent with nutrition recommendations for cardiovascular risk reduction. Cooking [employer] are taught by a Armed forces logistics/support/administrative officer (RD) who has been trained by the [employer]. The chef or RD has a clear understanding of the importance of minimizing - if not completely eliminating - added fat, sugar, and sodium in recipes. Throughout the series of Cooking School Workshop sessions, patients  will learn about healthy ingredients and efficient  methods of cooking to build confidence in their capability to prepare    Cooking School weekly topics:  Adding Flavor- Sodium-Free  Fast and Healthy Breakfasts  Powerhouse Plant-Based Proteins  Satisfying Salads and Dressings  Simple Sides and Sauces  International Cuisine-Spotlight on the [employer] Zones  Delicious Desserts  Savory Soups  [employer] - Meals in a Snap  Tasty Appetizers and Snacks  Comforting Weekend Breakfasts  One-Pot Wonders   Fast Evening Meals  Landscape architect Your [employer] Plate  WORKSHOPS   Healthy Mindset (Psychosocial):  Focused Goals, Sustainable Changes Clinical staff led group instruction and group discussion with PowerPoint presentation and patient guidebook. To enhance the learning environment the use of posters, models and videos may be added. Patients will be able to apply effective goal setting strategies to establish at least one personal goal, and then take consistent, meaningful action toward that goal. They will learn to identify common barriers to achieving personal goals and develop strategies to overcome them. Patients will also gain an understanding of how our mind-set can impact our ability to achieve goals and the importance of cultivating a positive and growth-oriented mind-set. The purpose of this lesson is to provide patients with a deeper understanding of how to set and achieve personal goals, as well as the tools and strategies needed to overcome common obstacles which may arise along the way.  From Head to Heart: The Power of a Healthy Outlook  Clinical staff led group instruction and group discussion with PowerPoint presentation and patient guidebook. To enhance the learning environment the use of posters, models and videos may be added. Patients will be able to recognize and describe the impact of emotions and mood on physical health. They will discover the importance  of self-care and explore self-care practices which may work for them. Patients will also learn how to utilize the 4 C's to cultivate a healthier outlook and better manage stress and challenges. The purpose of this lesson is to demonstrate to patients how a healthy outlook is an essential part of maintaining good health, especially as they continue their cardiac rehab journey.  Healthy Sleep for a Healthy Heart Clinical staff led group instruction and group discussion with PowerPoint presentation and patient guidebook. To enhance the learning environment the use of posters, models and videos may be added. At the conclusion of this workshop, patients will be able to demonstrate knowledge of the importance of sleep to overall health, well-being, and quality of life. They will understand the symptoms of, and treatments for, common sleep disorders. Patients will also be able to identify daytime and nighttime behaviors which impact sleep, and they will be able to apply these tools to help manage sleep-related challenges. The purpose of this lesson is to provide patients with a general overview of sleep and outline the importance of quality sleep. Patients will learn about a few of the most common sleep disorders. Patients will also be introduced to the concept of "sleep hygiene," and discover ways to self-manage certain sleeping problems through simple daily behavior changes. Finally, the workshop will motivate patients by clarifying the links between quality sleep and their goals of heart-healthy living.   Recognizing and Reducing Stress Clinical staff led group instruction and group discussion with PowerPoint presentation and patient guidebook. To enhance the learning environment the use of posters, models and videos may be added. At the conclusion of this workshop, patients will be able to understand the types of stress reactions, differentiate between acute and chronic stress,  and recognize the impact that  chronic stress has on their health. They will also be able to apply different coping mechanisms, such as reframing negative self-talk. Patients will have the opportunity to practice a variety of stress management techniques, such as deep abdominal breathing, progressive muscle relaxation, and/or guided imagery.  The purpose of this lesson is to educate patients on the role of stress in their lives and to provide healthy techniques for coping with it.  Learning Barriers/Preferences:  Learning Barriers/Preferences - 12/06/22 1205       Learning Barriers/Preferences   Learning Barriers Exercise Concerns;Sight;Hearing   Wears glasses, hearing loss does not have a hearing aide at this time. fell 2 weeks ago has problems with balance due to back problems   Learning Preferences Individual Instruction;Pictoral;Skilled Demonstration;Verbal Instruction;Written Material;Audio             Education Topics:  Knowledge Questionnaire Score:  Knowledge Questionnaire Score - 12/06/22 1126       Knowledge Questionnaire Score   Pre Score 26/28             Core Components/Risk Factors/Patient Goals at Admission:  Personal Goals and Risk Factors at Admission - 12/06/22 1118       Core Components/Risk Factors/Patient Goals on Admission   Tobacco Cessation Yes    Number of packs per day 1/2 a cigarette / week    Intervention Assist the participant in steps to quit. Provide individualized education and counseling about committing to Tobacco Cessation, relapse prevention, and pharmacological support that can be provided by physician.;Education officer, environmental, assist with locating and accessing local/national Quit Smoking programs, and support quit date choice.    Expected Outcomes Short Term: Will demonstrate readiness to quit, by selecting a quit date.;Long Term: Complete abstinence from all tobacco products for at least 12 months from quit date.;Short Term: Will quit all tobacco product use,  adhering to prevention of relapse plan.    Hypertension Yes    Intervention Provide education on lifestyle modifcations including regular physical activity/exercise, weight management, moderate sodium restriction and increased consumption of fresh fruit, vegetables, and low fat dairy, alcohol moderation, and smoking cessation.;Monitor prescription use compliance.    Expected Outcomes Short Term: Continued assessment and intervention until BP is < 140/70mm HG in hypertensive participants. < 130/67mm HG in hypertensive participants with diabetes, heart failure or chronic kidney disease.;Long Term: Maintenance of blood pressure at goal levels.    Lipids Yes    Intervention Provide education and support for participant on nutrition & aerobic/resistive exercise along with prescribed medications to achieve LDL 70mg , HDL >40mg .    Expected Outcomes Short Term: Participant states understanding of desired cholesterol values and is compliant with medications prescribed. Participant is following exercise prescription and nutrition guidelines.;Long Term: Cholesterol controlled with medications as prescribed, with individualized exercise RX and with personalized nutrition plan. Value goals: LDL < 70mg , HDL > 40 mg.    Stress Yes    Intervention Offer individual and/or small group education and counseling on adjustment to heart disease, stress management and health-related lifestyle change. Teach and support self-help strategies.;Refer participants experiencing significant psychosocial distress to appropriate mental health specialists for further evaluation and treatment. When possible, include family members and significant others in education/counseling sessions.    Expected Outcomes Short Term: Participant demonstrates changes in health-related behavior, relaxation and other stress management skills, ability to obtain effective social support, and compliance with psychotropic medications if prescribed.;Long Term:  Emotional wellbeing is indicated by absence of clinically significant psychosocial distress  or social isolation.    Personal Goal Other Yes    Personal Goal Be able to walk longer and more briskly. Gain confidence with exercise.    Intervention Develop an exercise action plan that Wendy Lopez can do at home to help build stamina and aerobic fitness, so that she can walk longer and more briskly.    Expected Outcomes Wendy Lopez will be more confident with her exercise routine, and she will be able to walk longer and faster as measured by self-report and 6-minute walk test.             Core Components/Risk Factors/Patient Goals Review:    Core Components/Risk Factors/Patient Goals at Discharge (Final Review):    ITP Comments:  ITP Comments     Row Name 12/06/22 1013 12/14/22 1628         ITP Comments Medical Director- Dr. Armanda Magic, MD. Introduction to the [facility] Program / Intensive Cardiac Rehab. Reviewed intial orientation folder. 30 Day ITP Review. Leverne started cardiac rehab on 12/14/22 and did well with exercise,               Comments: Pt started cardiac rehab today.  Pt tolerated light exercise without difficulty. VSS, telemetry-Sinus Rhythm t wave inversion, asymptomatic.  Medication list reconciled. Pt denies barriers to medicaiton compliance.  PSYCHOSOCIAL ASSESSMENT:  PHQ-5. Pt exhibits positive coping skills, hopeful outlook with supportive family. No psychosocial needs identified at this time, no psychosocial interventions necessary.   Pt oriented to exercise equipment and routine.    Understanding verbalized. Thayer Headings RN BSN

## 2022-12-16 ENCOUNTER — Encounter (HOSPITAL_COMMUNITY): Payer: Medicare Other

## 2022-12-21 ENCOUNTER — Encounter (HOSPITAL_COMMUNITY)
Admission: RE | Admit: 2022-12-21 | Discharge: 2022-12-21 | Disposition: A | Discharge status: Home / Self Care | Payer: Medicare Other | Source: Ambulatory Visit | Attending: Cardiovascular Disease | Admitting: Cardiovascular Disease

## 2022-12-21 DIAGNOSIS — I2111 ST elevation (STEMI) myocardial infarction involving right coronary artery: Secondary | ICD-10-CM | POA: Insufficient documentation

## 2022-12-21 DIAGNOSIS — Z955 Presence of coronary angioplasty implant and graft: Secondary | ICD-10-CM | POA: Diagnosis not present

## 2022-12-23 ENCOUNTER — Encounter (HOSPITAL_COMMUNITY): Payer: Medicare Other

## 2022-12-26 ENCOUNTER — Encounter (HOSPITAL_COMMUNITY)
Admission: RE | Admit: 2022-12-26 | Discharge: 2022-12-26 | Disposition: A | Discharge status: Home / Self Care | Payer: Medicare Other | Source: Ambulatory Visit | Attending: Cardiovascular Disease | Admitting: Cardiovascular Disease

## 2022-12-26 DIAGNOSIS — I2111 ST elevation (STEMI) myocardial infarction involving right coronary artery: Secondary | ICD-10-CM

## 2022-12-26 DIAGNOSIS — Z955 Presence of coronary angioplasty implant and graft: Secondary | ICD-10-CM | POA: Diagnosis not present

## 2022-12-28 ENCOUNTER — Telehealth (HOSPITAL_COMMUNITY): Payer: Self-pay

## 2022-12-28 ENCOUNTER — Encounter (HOSPITAL_COMMUNITY): Payer: Medicare Other

## 2022-12-28 NOTE — Telephone Encounter (Signed)
Pt called and has a sinus infection as well as a ear infection. I have canceled her appointment for today!

## 2022-12-30 ENCOUNTER — Encounter (HOSPITAL_COMMUNITY): Payer: Medicare Other

## 2023-01-02 ENCOUNTER — Telehealth (HOSPITAL_COMMUNITY): Payer: Self-pay

## 2023-01-02 ENCOUNTER — Encounter (HOSPITAL_COMMUNITY): Payer: Medicare Other

## 2023-01-02 NOTE — Telephone Encounter (Signed)
Wendy Lopez called and stated she is still sick. Her throat has now started hurting. Since her fever is not high enough to get into the doctor right away she did schedule an appointment for tomorrow. She did say " I don't want to come in and it be covid, Or someone get what I have and it kill them." I told her that we do want them to stay home if they are sick for they safety of others as well as themselves. She will call after her doctors appointment tomorrow and let us know what is going on! I have canceled her appointment for today!

## 2023-01-04 ENCOUNTER — Encounter (HOSPITAL_COMMUNITY): Payer: Medicare Other

## 2023-01-04 DIAGNOSIS — Z03818 Encounter for observation for suspected exposure to other biological agents ruled out: Secondary | ICD-10-CM | POA: Diagnosis not present

## 2023-01-04 DIAGNOSIS — J069 Acute upper respiratory infection, unspecified: Secondary | ICD-10-CM | POA: Diagnosis not present

## 2023-01-06 ENCOUNTER — Encounter (HOSPITAL_COMMUNITY): Payer: Medicare Other

## 2023-01-09 ENCOUNTER — Encounter (HOSPITAL_COMMUNITY): Payer: Medicare Other

## 2023-01-10 DIAGNOSIS — I2510 Atherosclerotic heart disease of native coronary artery without angina pectoris: Secondary | ICD-10-CM | POA: Diagnosis not present

## 2023-01-10 DIAGNOSIS — Z23 Encounter for immunization: Secondary | ICD-10-CM | POA: Diagnosis not present

## 2023-01-10 DIAGNOSIS — E559 Vitamin D deficiency, unspecified: Secondary | ICD-10-CM | POA: Diagnosis not present

## 2023-01-10 DIAGNOSIS — E7800 Pure hypercholesterolemia, unspecified: Secondary | ICD-10-CM | POA: Diagnosis not present

## 2023-01-10 DIAGNOSIS — E538 Deficiency of other specified B group vitamins: Secondary | ICD-10-CM | POA: Diagnosis not present

## 2023-01-10 DIAGNOSIS — I10 Essential (primary) hypertension: Secondary | ICD-10-CM | POA: Diagnosis not present

## 2023-01-10 DIAGNOSIS — Z853 Personal history of malignant neoplasm of breast: Secondary | ICD-10-CM | POA: Diagnosis not present

## 2023-01-10 DIAGNOSIS — J439 Emphysema, unspecified: Secondary | ICD-10-CM | POA: Diagnosis not present

## 2023-01-10 DIAGNOSIS — I700 Atherosclerosis of aorta: Secondary | ICD-10-CM | POA: Diagnosis not present

## 2023-01-10 DIAGNOSIS — G40909 Epilepsy, unspecified, not intractable, without status epilepticus: Secondary | ICD-10-CM | POA: Diagnosis not present

## 2023-01-10 DIAGNOSIS — G720 Drug-induced myopathy: Secondary | ICD-10-CM | POA: Diagnosis not present

## 2023-01-10 DIAGNOSIS — F322 Major depressive disorder, single episode, severe without psychotic features: Secondary | ICD-10-CM | POA: Diagnosis not present

## 2023-01-11 ENCOUNTER — Ambulatory Visit (INDEPENDENT_AMBULATORY_CARE_PROVIDER_SITE_OTHER): Payer: Medicare Other | Admitting: Neurology

## 2023-01-11 ENCOUNTER — Encounter (HOSPITAL_COMMUNITY)
Admission: RE | Admit: 2023-01-11 | Discharge: 2023-01-11 | Disposition: A | Discharge status: Home / Self Care | Payer: Medicare Other | Source: Ambulatory Visit | Attending: Cardiovascular Disease | Admitting: Cardiovascular Disease

## 2023-01-11 ENCOUNTER — Encounter: Payer: Self-pay | Admitting: Neurology

## 2023-01-11 VITALS — BP 140/84 | HR 66 | Ht 62.0 in | Wt 144.5 lb

## 2023-01-11 DIAGNOSIS — I2111 ST elevation (STEMI) myocardial infarction involving right coronary artery: Secondary | ICD-10-CM

## 2023-01-11 DIAGNOSIS — Z955 Presence of coronary angioplasty implant and graft: Secondary | ICD-10-CM | POA: Diagnosis not present

## 2023-01-11 DIAGNOSIS — R519 Headache, unspecified: Secondary | ICD-10-CM

## 2023-01-11 DIAGNOSIS — G40109 Localization-related (focal) (partial) symptomatic epilepsy and epileptic syndromes with simple partial seizures, not intractable, without status epilepticus: Secondary | ICD-10-CM

## 2023-01-11 DIAGNOSIS — G8929 Other chronic pain: Secondary | ICD-10-CM

## 2023-01-11 MED ORDER — LEVETIRACETAM 750 MG PO TABS: 750.0000 mg | ORAL_TABLET | Freq: Two times a day (BID) | ORAL | 3 refills | Status: DC

## 2023-01-11 NOTE — Progress Notes (Signed)
Cardiac Individual Treatment Plan  Patient Details  Name: Wendy Lopez MRN: [id number] Date of Birth: [birth year] Referring Provider:   Flowsheet Row INTENSIVE CARDIAC REHAB ORIENT from 12/06/2022 in [facility] for Heart, Vascular, & Lung Health  Referring Provider Lennette Bihari, MD       Initial Encounter Date:  Flowsheet Row INTENSIVE CARDIAC REHAB ORIENT from 12/06/2022 in [facility] for Heart, Vascular, & Lung Health  Date 12/06/22       Visit Diagnosis: 10/23/22 STEMI  10/23/22 PCI / DES RCA  Patient's Home Medications on Admission:  Current Outpatient Medications:    aspirin EC 81 MG tablet, Take 1 tablet (81 mg total) by mouth daily. Swallow whole., Disp: 120 tablet, Rfl: 3   atorvastatin (LIPITOR) 80 MG tablet, Take 1 tablet (80 mg total) by mouth daily. (Patient taking differently: Take 80 mg by mouth daily. Pt says takes have pt tab 40mg ), Disp: 90 tablet, Rfl: 1   Cyanocobalamin (VITAMIN B-12) 3000 MCG/ML LIQD, Inject 3,000 mcg as directed every 30 (thirty) days., Disp: , Rfl:    EPINEPHrine 0.3 mg/0.3 mL IJ SOAJ injection, Inject 0.3 mg into the muscle as needed for anaphylaxis., Disp: , Rfl:    gabapentin (NEURONTIN) 100 MG capsule, Take 1 capsule (100 mg total) by mouth at bedtime. (Patient not taking: Reported on 11/30/2022), Disp: 30 capsule, Rfl: 0   isosorbide mononitrate (IMDUR) 30 MG 24 hr tablet, Take 0.5 tablets (15 mg total) by mouth daily., Disp: 45 tablet, Rfl: 1   levETIRAcetam (KEPPRA) 750 MG tablet, Take 1 tablet (750 mg total) by mouth 2 (two) times daily., Disp: 60 tablet, Rfl: 11   losartan (COZAAR) 100 MG tablet, Take 1 tablet (100 mg total) by mouth daily. (Patient not taking: Reported on 11/30/2022), Disp: 30 tablet, Rfl: 1   metoprolol succinate (TOPROL-XL) 100 MG 24 hr tablet, TAKE 1 TABLET(100 MG) BY MOUTH DAILY WITH OR IMMEDIATELY FOLLOWING A MEAL, Disp: 90 tablet, Rfl: 3   nicotine (NICODERM CQ -  DOSED IN MG/24 HOURS) 14 mg/24hr patch, Place 1 patch (14 mg total) onto the skin daily. (Patient not taking: Reported on 11/30/2022), Disp: 30 patch, Rfl: 0   nitroGLYCERIN (NITROSTAT) 0.4 MG SL tablet, Place 1 tablet (0.4 mg total) under the tongue every 5 (five) minutes as needed., Disp: 25 tablet, Rfl: 2   ondansetron (ZOFRAN-ODT) 4 MG disintegrating tablet, Take 1 tablet (4 mg total) by mouth every 8 (eight) hours as needed for nausea or vomiting. (Patient not taking: Reported on 12/06/2022), Disp: 20 tablet, Rfl: 0   sodium chloride (OCEAN) 0.65 % nasal spray, Place 1-2 sprays into the nose as needed for congestion., Disp: , Rfl:    ticagrelor (BRILINTA) 90 MG TABS tablet, Take 1 tablet (90 mg total) by mouth 2 (two) times daily., Disp: 180 tablet, Rfl: 2   Tiotropium Bromide-Olodaterol (STIOLTO RESPIMAT) 2.5-2.5 MCG/ACT AERS, Inhale 2 puffs into the lungs daily. (Patient not taking: Reported on 11/30/2022), Disp: 4 g, Rfl: 5   ZYRTEC ALLERGY 10 MG tablet, Take 10 mg by mouth at bedtime as needed for allergies., Disp: , Rfl:   Past Medical History: Past Medical History:  Diagnosis Date   Anxiety and depression    Arthritis    Breast cancer (HCC) 02/13/2009   Cancer of right breast (HCC) 04/16/2012   Coronary artery disease    Depression    Depression 04/18/2012   Dysrhythmia    palpitations   Epilepsy (HCC)  last seizure 1990's   H/O hiatal hernia    Hyperlipidemia    Hypertension    Localization-related (focal) (partial) epilepsy and epileptic syndromes with complex partial seizures, without mention of intractable epilepsy 02/07/2013   Memory deficit 02/07/2013   Memory loss    Mitral valve prolapse 06/01/2010   echo- EF>55% mild tricupsid regurgitation. There is mild Pulmonary Hypertension.. Right  ventricular systolic pressure iselevated at 30-40 mmHg. LV systolic function is normal.   Osteopenia    PONV (postoperative nausea and vomiting)    SOB (shortness of breath)  10/18/2011   met-test   Spinal fusion failure    3    Tobacco Use: Social History   Tobacco Use  Smoking Status Some Days   Current packs/day: 1.00   Average packs/day: 1 pack/day for 43.0 years (43.0 ttl pk-yrs)   Types: Cigarettes  Smokeless Tobacco Never  Tobacco Comments   Smokes a 1/2 of a cigarette a week. Has talked to [facility]. Offered virtual cessation classes declined. 12/06/22 MWW RN    Labs: Review Flowsheet       Latest Ref Rng & Units 02/17/2017 06/14/2022 10/23/2022 10/26/2022  Labs for ITP Cardiac and Pulmonary Rehab  Cholestrol 0 - 200 mg/dL 161  096  045  409   LDL (calc) 0 - 99 mg/dL 87  811  914  782   HDL-C >40 mg/dL 70  66  64  70   Trlycerides <150 mg/dL 65  71  83  71   Hemoglobin A1c 4.8 - 5.6 % - - 5.5  5.6   TCO2 22 - 32 mmol/L - - 24  -    Details            Capillary Blood Glucose: No results found for: "GLUCAP"   Exercise Target Goals: Exercise Program Goal: Individual exercise prescription set using results from initial 6 min walk test and THRR while considering  patient's activity barriers and safety.   Exercise Prescription Goal: Initial exercise prescription builds to 30-45 minutes a day of aerobic activity, 2-3 days per week.  Home exercise guidelines will be given to patient during program as part of exercise prescription that the participant will acknowledge.  Activity Barriers & Risk Stratification:  Activity Barriers & Cardiac Risk Stratification - 12/06/22 1117       Activity Barriers & Cardiac Risk Stratification   Activity Barriers Neck/Spine Problems;Balance Concerns;History of Falls;Other (comment);Arthritis;Back Problems;Chest Pain/Angina;Shortness of Breath    Comments Bulging disc- neck pain; seizure disorder-controlled; no cartilage in right knee-pain; lumbar spine fusion-back surgery x3    Cardiac Risk Stratification High             6 Minute Walk:  6 Minute Walk     Row Name 12/06/22 1200         6  Minute Walk   Phase Initial     Distance 1129 feet     Walk Time 6 minutes     # of Rest Breaks 0     MPH 2.14     METS 3.08     RPE 10.5     Perceived Dyspnea  0     VO2 Peak 10.77     Symptoms Yes (comment)     Comments Right knee and ankle pain- chronic, 1/10 on pain scale.     Resting HR 72 bpm     Resting BP 118/80     Resting Oxygen Saturation  97 %     Exercise Oxygen Saturation  during 6 min walk 95 %     Max Ex. HR 86 bpm     Max Ex. BP 148/80     2 Minute Post BP 128/80              Oxygen Initial Assessment:   Oxygen Re-Evaluation:   Oxygen Discharge (Final Oxygen Re-Evaluation):   Initial Exercise Prescription:  Initial Exercise Prescription - 12/06/22 1100       Date of Initial Exercise RX and Referring Provider   Date 12/06/22    Referring Provider Lennette Bihari, MD    Expected Discharge Date 03/01/23      Recumbant Bike   Level 1    Watts 20    Minutes 15    METs 1.5      NuStep   Level 1    SPM 85    Minutes 15    METs 1.5      Prescription Details   Frequency (times per week) 2    Duration Progress to 30 minutes of continuous aerobic without signs/symptoms of physical distress      Intensity   THRR 40-80% of Max Heartrate 63-126    Ratings of Perceived Exertion 11-13    Perceived Dyspnea 0-4      Progression   Progression Continue to progress workloads to maintain intensity without signs/symptoms of physical distress.      Resistance Training   Training Prescription Yes    Weight 2 lbs    Reps 10-15             Perform Capillary Blood Glucose checks as needed.  Exercise Prescription Changes:   Exercise Prescription Changes     Row Name 12/14/22 1609             Response to Exercise   Blood Pressure (Admit) 110/70       Blood Pressure (Exercise) 126/68       Blood Pressure (Exit) 106/60       Heart Rate (Admit) 75 bpm       Heart Rate (Exercise) 107 bpm       Heart Rate (Exit) 74 bpm       Rating of  Perceived Exertion (Exercise) 12       Perceived Dyspnea (Exercise) 0       Symptoms 0       Comments Pt first day in [facility]       Duration Progress to 30 minutes of  aerobic without signs/symptoms of physical distress       Intensity THRR unchanged         Progression   Progression Continue to progress workloads to maintain intensity without signs/symptoms of physical distress.       Average METs 1.8         Resistance Training   Training Prescription No         Recumbant Bike   Level 1       RPM 55       Watts 12       Minutes 15       METs 2         NuStep   Level 1       SPM 63       Minutes 15       METs 1.6                Exercise Comments:   Exercise Comments     Row Name 12/14/22 [phone]  01/11/23 0855         Exercise Comments Pt first day in the [facility] I[facility] program. Pt tolerated exercise well with an average MET level of 1.8. Pt is learning her THRR, RPE and ExRx Patient out due to illness, will review education on her return. Patient absent sice 12/26/22               Exercise Goals and Review:   Exercise Goals     Row Name 12/06/22 1118             Exercise Goals   Increase Physical Activity Yes       Intervention Provide advice, education, support and counseling about physical activity/exercise needs.;Develop an individualized exercise prescription for aerobic and resistive training based on initial evaluation findings, risk stratification, comorbidities and participant's personal goals.       Expected Outcomes Short Term: Attend rehab on a regular basis to increase amount of physical activity.;Long Term: Add in home exercise to make exercise part of routine and to increase amount of physical activity.;Long Term: Exercising regularly at least 3-5 days a week.       Increase Strength and Stamina Yes       Intervention Provide advice, education, support and counseling about physical activity/exercise needs.;Develop an individualized exercise  prescription for aerobic and resistive training based on initial evaluation findings, risk stratification, comorbidities and participant's personal goals.       Expected Outcomes Short Term: Increase workloads from initial exercise prescription for resistance, speed, and METs.;Short Term: Perform resistance training exercises routinely during rehab and add in resistance training at home;Long Term: Improve cardiorespiratory fitness, muscular endurance and strength as measured by increased METs and functional capacity ( )       Able to understand and use rate of perceived exertion (RPE) scale Yes       Intervention Provide education and explanation on how to use RPE scale       Expected Outcomes Short Term: Able to use RPE daily in rehab to express subjective intensity level;Long Term:  Able to use RPE to guide intensity level when exercising independently       Knowledge and understanding of Target Heart Rate Range (THRR) Yes       Intervention Provide education and explanation of THRR including how the numbers were predicted and where they are located for reference       Expected Outcomes Short Term: Able to state/look up THRR;Long Term: Able to use THRR to govern intensity when exercising independently;Short Term: Able to use daily as guideline for intensity in rehab       Able to check pulse independently Yes       Intervention Provide education and demonstration on how to check pulse in carotid and radial arteries.;Review the importance of being able to check your own pulse for safety during independent exercise       Expected Outcomes Short Term: Able to explain why pulse checking is important during independent exercise;Long Term: Able to check pulse independently and accurately       Understanding of Exercise Prescription Yes       Intervention Provide education, explanation, and written materials on patient's individual exercise prescription       Expected Outcomes Short Term: Able to explain  program exercise prescription;Long Term: Able to explain home exercise prescription to exercise independently                Exercise Goals Re-Evaluation :  Exercise Goals Re-Evaluation  Row Name 12/14/22 1611             Exercise Goal Re-Evaluation   Exercise Goals Review Increase Physical Activity;Understanding of Exercise Prescription;Increase Strength and Stamina;Knowledge and understanding of Target Heart Rate Range (THRR);Able to understand and use rate of perceived exertion (RPE) scale       Comments Pt first day in the [facility] program. Pt tolerated exercise well with an average MET level of 1.8. Pt is learning her THRR, RPE and ExRx       Expected Outcomes Will continue to monitor pt and progress workloads as tolerated without sign or symptom                Discharge Exercise Prescription (Final Exercise Prescription Changes):  Exercise Prescription Changes - 12/14/22 1609       Response to Exercise   Blood Pressure (Admit) 110/70    Blood Pressure (Exercise) 126/68    Blood Pressure (Exit) 106/60    Heart Rate (Admit) 75 bpm    Heart Rate (Exercise) 107 bpm    Heart Rate (Exit) 74 bpm    Rating of Perceived Exertion (Exercise) 12    Perceived Dyspnea (Exercise) 0    Symptoms 0    Comments Pt first day in [facility]    Duration Progress to 30 minutes of  aerobic without signs/symptoms of physical distress    Intensity THRR unchanged      Progression   Progression Continue to progress workloads to maintain intensity without signs/symptoms of physical distress.    Average METs 1.8      Resistance Training   Training Prescription No      Recumbant Bike   Level 1    RPM 55    Watts 12    Minutes 15    METs 2      NuStep   Level 1    SPM 63    Minutes 15    METs 1.6             Nutrition:  Target Goals: Understanding of nutrition guidelines, daily intake of sodium 1500mg , cholesterol 200mg , calories 30% from fat and 7% or less  from saturated fats, daily to have 5 or more servings of fruits and vegetables.  Biometrics:  Pre Biometrics - 12/06/22 1013       Pre Biometrics   Waist Circumference 32 inches    Hip Circumference 40 inches    Waist to Hip Ratio 0.8 %    Triceps Skinfold 28 mm    % Body Fat 36.2 %    Grip Strength 22 kg    Flexibility --   Not performed, lumbar spine fusion.   Single Leg Stand 5.75 seconds              Nutrition Therapy Plan and Nutrition Goals:  Nutrition Therapy & Goals - 12/14/22 1552       Nutrition Therapy   Diet Heart Healthy Diet    Drug/Food Interactions Statins/Certain Fruits      Personal Nutrition Goals   Nutrition Goal Patient to identify strategies for reducing cardiovascular risk by attending the [facility] education and nutrition series weekly.    Personal Goal #2 Patient to improve diet quality by using the plate method as a guide for meal planning to include lean protein/plant protein, fruits, vegetables, whole grains, nonfat dairy as part of a well-balanced diet    Personal Goal #3 Patient to reduce sodium to 1500mg  per day    Comments  Kalianna has medical history of breast cancer, STEMI, HTN, tobacco abuse. Patient will benefit from participation in intensive cardiac rehab for nutrition, exercise, and lifestyle modification.      Intervention Plan   Intervention Prescribe, educate and counsel regarding individualized specific dietary modifications aiming towards targeted core components such as weight, hypertension, lipid management, diabetes, heart failure and other comorbidities.;Nutrition handout(s) given to patient.    Expected Outcomes Short Term Goal: Understand basic principles of dietary content, such as calories, fat, sodium, cholesterol and nutrients.;Long Term Goal: Adherence to prescribed nutrition plan.             Nutrition Assessments:  Nutrition Assessments - 12/14/22 1533       Rate Your Plate Scores   Pre Score 77             MEDIFICTS Score Key: >=70 Need to make dietary changes  40-70 Heart Healthy Diet <= 40 Therapeutic Level Cholesterol Diet   Flowsheet Row INTENSIVE CARDIAC REHAB from 12/14/2022 in [facility] for Heart, Vascular, & Lung Health  Picture Your Plate Total Score on Admission 77      Picture Your Plate Scores: <63 Unhealthy dietary pattern with much room for improvement. 41-50 Dietary pattern unlikely to meet recommendations for good health and room for improvement. 51-60 More healthful dietary pattern, with some room for improvement.  >60 Healthy dietary pattern, although there may be some specific behaviors that could be improved.    Nutrition Goals Re-Evaluation:  Nutrition Goals Re-Evaluation     Row Name 12/14/22 1552             Goals   Current Weight 145 lb 1 oz (65.8 kg)       Comment LDL 113, A1c WNL, AST 41, ALT 42       Expected Outcome Tifanny has medical history of breast cancer, STEMI, HTN, tobacco abuse. Patient will benefit from participation in intensive cardiac rehab for nutrition, exercise, and lifestyle modification.                Nutrition Goals Re-Evaluation:  Nutrition Goals Re-Evaluation     Row Name 12/14/22 1552             Goals   Current Weight 145 lb 1 oz (65.8 kg)       Comment LDL 113, A1c WNL, AST 41, ALT 42       Expected Outcome Joshalynn has medical history of breast cancer, STEMI, HTN, tobacco abuse. Patient will benefit from participation in intensive cardiac rehab for nutrition, exercise, and lifestyle modification.                Nutrition Goals Discharge (Final Nutrition Goals Re-Evaluation):  Nutrition Goals Re-Evaluation - 12/14/22 1552       Goals   Current Weight 145 lb 1 oz (65.8 kg)    Comment LDL 113, A1c WNL, AST 41, ALT 42    Expected Outcome Dynisha has medical history of breast cancer, STEMI, HTN, tobacco abuse. Patient will benefit from participation in intensive cardiac rehab for  nutrition, exercise, and lifestyle modification.             Psychosocial: Target Goals: Acknowledge presence or absence of significant depression and/or stress, maximize coping skills, provide positive support system. Participant is able to verbalize types and ability to use techniques and skills needed for reducing stress and depression.  Initial Review & Psychosocial Screening:  Initial Psych Review & Screening - 12/06/22 1124  Initial Review   Current issues with History of Depression;Current Depression;Current Stress Concerns;Current Sleep Concerns    Source of Stress Concerns Chronic Illness;Unable to perform yard/household activities;Unable to participate in former interests or hobbies    Comments Zikeria says she has had some difficulty sleeping due to taking new medication isosorbide. Paulet had stress related to recent MI stenitng, continued pain.      Family Dynamics   Good Support System? Yes   Niko has her husband, son, siblings and church family for support     Barriers   Psychosocial barriers to participate in program The patient should benefit from training in stress management and relaxation.      Screening Interventions   Interventions Encouraged to exercise    Expected Outcomes Long Term Goal: Stressors or current issues are controlled or eliminated.;Short Term goal: Utilizing psychosocial counselor, staff and physician to assist with identification of specific Stressors or current issues interfering with healing process. Setting desired goal for each stressor or current issue identified.;Long Term goal: The participant improves quality of Life and PHQ9 Scores as seen by post scores and/or verbalization of changes;Short Term goal: Identification and review with participant of any Quality of Life or Depression concerns found by scoring the questionnaire.             Quality of Life Scores:  Quality of Life - 12/06/22 1121       Quality of Life    Select Quality of Life      Quality of Life Scores   Health/Function Pre 25.6 %    Socioeconomic Pre 28 %    Psych/Spiritual Pre 27.07 %    Family Pre 30 %    GLOBAL Pre 27.04 %            Scores of 19 and below usually indicate a poorer quality of life in these areas.  A difference of  2-3 points is a clinically meaningful difference.  A difference of 2-3 points in the total score of the Quality of Life Index has been associated with significant improvement in overall quality of life, self-image, physical symptoms, and general health in studies assessing change in quality of life.  PHQ-9: Review Flowsheet       12/06/2022 03/18/2014  Depression screen PHQ 2/9  Decreased Interest 0 0  Down, Depressed, Hopeless 1 0  PHQ - 2 Score 1 0  Altered sleeping 1 -  Tired, decreased energy 2 -  Change in appetite 0 -  Feeling bad or failure about yourself  0 -  Trouble concentrating 1 -  Moving slowly or fidgety/restless 0 -  Suicidal thoughts 0 -  PHQ-9 Score 5 -  Difficult doing work/chores Not difficult at all -    Details           Interpretation of Total Score  Total Score Depression Severity:  1-4 = Minimal depression, 5-9 = Mild depression, 10-14 = Moderate depression, 15-19 = Moderately severe depression, 20-27 = Severe depression   Psychosocial Evaluation and Intervention:   Psychosocial Re-Evaluation:  Psychosocial Re-Evaluation     Row Name 12/15/22 1300 01/11/23 0957           Psychosocial Re-Evaluation   Current issues with History of Depression;Current Sleep Concerns;Current Depression;Current Stress Concerns History of Depression;Current Sleep Concerns;Current Depression;Current Stress Concerns      Comments Annalisse did not voice any increased concerns or stressors on her first day of exercise. Unable to reassess Vihana has been absent due to illness  Expected Outcomes Skylla will have decreased or controlled depression/ stressors upon completion of  cardiac rehab Taimane will have decreased or controlled depression/ stressors upon completion of cardiac rehab      Interventions Stress management education;Encouraged to attend Cardiac Rehabilitation for the exercise;Relaxation education Stress management education;Encouraged to attend Cardiac Rehabilitation for the exercise;Relaxation education      Continue Psychosocial Services  Follow up required by staff Follow up required by staff        Initial Review   Source of Stress Concerns Occupation;Chronic Illness Occupation;Chronic Illness      Comments Will continue to monitor and offer support as needed Will continue to monitor and offer support as needed               Psychosocial Discharge (Final Psychosocial Re-Evaluation):  Psychosocial Re-Evaluation - 01/11/23 0957       Psychosocial Re-Evaluation   Current issues with History of Depression;Current Sleep Concerns;Current Depression;Current Stress Concerns    Comments Unable to reassess Aafiya has been absent due to illness    Expected Outcomes Rhema will have decreased or controlled depression/ stressors upon completion of cardiac rehab    Interventions Stress management education;Encouraged to attend Cardiac Rehabilitation for the exercise;Relaxation education    Continue Psychosocial Services  Follow up required by staff      Initial Review   Source of Stress Concerns Occupation;Chronic Illness    Comments Will continue to monitor and offer support as needed             Vocational Rehabilitation: Provide vocational rehab assistance to qualifying candidates.   Vocational Rehab Evaluation & Intervention:  Vocational Rehab - 12/06/22 1211       Initial Vocational Rehab Evaluation & Intervention   Assessment shows need for Vocational Rehabilitation No   Cybelle is disabled and does not need vocational rehab at this time.            Education: Education Goals: Education classes will be provided on a weekly  basis, covering required topics. Participant will state understanding/return demonstration of topics presented.    Education     Row Name 12/14/22 1500     Education   Cardiac Education Topics [facility]   Orthoptist   Educator Dietitian   Weekly Topic Fast and Healthy Breakfasts   Instruction Review Code 1- Verbalizes Understanding   Class Start Time 1400   Class Stop Time 1445   Class Time Calculation (min) 45 min    Row Name 12/21/22 1500     Education   Cardiac Education Topics [facility]   Customer service manager   Weekly Topic Personalizing Your Pritikin Plate   Instruction Review Code 1- Verbalizes Understanding   Class Start Time 1400   Class Stop Time 1440   Class Time Calculation (min) 40 min    Row Name 12/26/22 1600     Education   Cardiac Education Topics [facility]   Glass blower/designer Nutrition   Nutrition Workshop Label Reading   Instruction Review Code 1- Teaching laboratory technician Start Time 1400   Class Stop Time 1445   Class Time Calculation (min) 45 min            Core Videos: Exercise    Move It!  Clinical staff conducted group or individual video education with verbal and written  material and guidebook.  Patient learns the recommended [facility]in exercise program. Exercise with the goal of living a long, healthy life. Some of the health benefits of exercise include controlled diabetes, healthier blood pressure levels, improved cholesterol levels, improved heart and lung capacity, improved sleep, and better body composition. Everyone should speak with their doctor before starting or changing an exercise routine.  Biomechanical Limitations Clinical staff conducted group or individual video education with verbal and written material and guidebook.  Patient learns how biomechanical limitations can impact exercise and how we can  mitigate and possibly overcome limitations to have an impactful and balanced exercise routine.  Body Composition Clinical staff conducted group or individual video education with verbal and written material and guidebook.  Patient learns that body composition (ratio of muscle mass to fat mass) is a key component to assessing overall fitness, rather than body weight alone. Increased fat mass, especially visceral belly fat, can put [place] at increased risk for metabolic syndrome, type 2 diabetes, heart disease, and even death. It is recommended to combine diet and exercise (cardiovascular and resistance training) to improve your body composition. Seek guidance from your physician and exercise physiologist before implementing an exercise routine.  Exercise Action Plan Clinical staff conducted group or individual video education with verbal and written material and guidebook.  Patient learns the recommended strategies to achieve and enjoy long-term exercise adherence, including variety, self-motivation, self-efficacy, and positive decision making. Benefits of exercise include fitness, good health, weight management, more energy, better sleep, less stress, and overall well-being.  Medical   Heart Disease Risk Reduction Clinical staff conducted group or individual video education with verbal and written material and guidebook.  Patient learns our heart is our most vital organ as it circulates oxygen, nutrients, white blood cells, and hormones throughout the entire body, and carries waste away. Data supports a plant-based eating plan like the Pritikin Program for its effectiveness in slowing progression of and reversing heart disease. The video provides a number of recommendations to address heart disease.   Metabolic Syndrome and Belly Fat  Clinical staff conducted group or individual video education with verbal and written material and guidebook.  Patient learns what metabolic syndrome is, how it leads to  heart disease, and how one can reverse it and keep it from coming back. You have metabolic syndrome if you have 3 of the following 5 criteria: abdominal obesity, high blood pressure, high triglycerides, low HDL cholesterol, and high blood sugar.  Hypertension and Heart Disease Clinical staff conducted group or individual video education with verbal and written material and guidebook.  Patient learns that high blood pressure, or hypertension, is very common in the [place]. Hypertension is largely due to excessive salt intake, but other important risk factors include being overweight, physical inactivity, drinking too much alcohol, smoking, and not eating enough potassium from fruits and vegetables. High blood pressure is a leading risk factor for heart attack, stroke, congestive heart failure, dementia, kidney failure, and premature death. Long-term effects of excessive salt intake include stiffening of the arteries and thickening of heart muscle and organ damage. Recommendations include ways to reduce hypertension and the risk of heart disease.  Diseases of Our Time - Focusing on Diabetes Clinical staff conducted group or individual video education with verbal and written material and guidebook.  Patient learns why the best way to stop diseases of our time is prevention, through food and other lifestyle changes. Medicine (such as prescription pills and surgeries) is often only a Band-Aid on  the problem, not a long-term solution. Most common diseases of our time include obesity, type 2 diabetes, hypertension, heart disease, and cancer. The Pritikin Program is recommended and has been proven to help reduce, reverse, and/or prevent the damaging effects of metabolic syndrome.  Nutrition   Overview of the Pritikin Eating Plan  Clinical staff conducted group or individual video education with verbal and written material and guidebook.  Patient learns about the Pritikin Eating Plan for disease risk  reduction. The Pritikin Eating Plan emphasizes a wide variety of unrefined, minimally-processed carbohydrates, like fruits, vegetables, whole grains, and legumes. Go, Caution, and Stop food choices are explained. Plant-based and lean animal proteins are emphasized. Rationale provided for low sodium intake for blood pressure control, low added sugars for blood sugar stabilization, and low added fats and oils for coronary artery disease risk reduction and weight management.  Calorie Density  Clinical staff conducted group or individual video education with verbal and written material and guidebook.  Patient learns about calorie density and how it impacts the Pritikin Eating Plan. Knowing the characteristics of the food you choose will help you decide whether those foods will lead to weight gain or weight loss, and whether you want to consume more or less of them. Weight loss is usually a side effect of the Pritikin Eating Plan because of its focus on low calorie-dense foods.  Label Reading  Clinical staff conducted group or individual video education with verbal and written material and guidebook.  Patient learns about the Pritikin recommended label reading guidelines and corresponding recommendations regarding calorie density, added sugars, sodium content, and whole grains.  Dining Out - Part 1  Clinical staff conducted group or individual video education with verbal and written material and guidebook.  Patient learns that restaurant meals can be sabotaging because they can be so high in calories, fat, sodium, and/or sugar. Patient learns recommended strategies on how to positively address this and avoid unhealthy pitfalls.  Facts on Fats  Clinical staff conducted group or individual video education with verbal and written material and guidebook.  Patient learns that lifestyle modifications can be just as effective, if not more so, as many medications for lowering your risk of heart disease. A  Pritikin lifestyle can help to reduce your risk of inflammation and atherosclerosis (cholesterol build-up, or plaque, in the artery walls). Lifestyle interventions such as dietary choices and physical activity address the cause of atherosclerosis. A review of the types of fats and their impact on blood cholesterol levels, along with dietary recommendations to reduce fat intake is also included.  Nutrition Action Plan  Clinical staff conducted group or individual video education with verbal and written material and guidebook.  Patient learns how to incorporate Pritikin recommendations into their lifestyle. Recommendations include planning and keeping personal health goals in mind as an important part of their success.  Healthy Mind-Set    Healthy Minds, Bodies, Hearts  Clinical staff conducted group or individual video education with verbal and written material and guidebook.  Patient learns how to identify when they are stressed. Video will discuss the impact of that stress, as well as the many benefits of stress management. Patient will also be introduced to stress management techniques. The way we think, act, and feel has an impact on our hearts.  How Our Thoughts Can Heal Our Hearts  Clinical staff conducted group or individual video education with verbal and written material and guidebook.  Patient learns that negative thoughts can cause depression and anxiety. This can  result in negative lifestyle behavior and serious health problems. Cognitive behavioral therapy is an effective method to help control our thoughts in order to change and improve our emotional outlook.  Additional Videos:  Exercise    Improving Performance  Clinical staff conducted group or individual video education with verbal and written material and guidebook.  Patient learns to use a non-linear approach by alternating intensity levels and lengths of time spent exercising to help burn more calories and lose more body fat.  Cardiovascular exercise helps improve heart health, metabolism, hormonal balance, blood sugar control, and recovery from fatigue. Resistance training improves strength, endurance, balance, coordination, reaction time, metabolism, and muscle mass. Flexibility exercise improves circulation, posture, and balance. Seek guidance from your physician and exercise physiologist before implementing an exercise routine and learn your capabilities and proper form for all exercise.  Introduction to Yoga  Clinical staff conducted group or individual video education with verbal and written material and guidebook.  Patient learns about yoga, a discipline of the coming together of mind, breath, and body. The benefits of yoga include improved flexibility, improved range of motion, better posture and core strength, increased lung function, weight loss, and positive self-image. Yoga's heart health benefits include lowered blood pressure, healthier heart rate, decreased cholesterol and triglyceride levels, improved immune function, and reduced stress. Seek guidance from your physician and exercise physiologist before implementing an exercise routine and learn your capabilities and proper form for all exercise.  Medical   Aging: Enhancing Your Quality of Life  Clinical staff conducted group or individual video education with verbal and written material and guidebook.  Patient learns key strategies and recommendations to stay in good physical health and enhance quality of life, such as prevention strategies, having an advocate, securing a Health Care Proxy and Power of Attorney, and keeping a list of medications and system for tracking them. It also discusses how to avoid risk for bone loss.  Biology of Weight Control  Clinical staff conducted group or individual video education with verbal and written material and guidebook.  Patient learns that weight gain occurs because we consume more calories than we burn (eating more,  moving less). Even if your body weight is normal, you may have higher ratios of fat compared to muscle mass. Too much body fat puts you at increased risk for cardiovascular disease, heart attack, stroke, type 2 diabetes, and obesity-related cancers. In addition to exercise, following the Pritikin Eating Plan can help reduce your risk.  Decoding Lab Results  Clinical staff conducted group or individual video education with verbal and written material and guidebook.  Patient learns that lab test reflects one measurement whose values change over time and are influenced by many factors, including medication, stress, sleep, exercise, food, hydration, pre-existing medical conditions, and more. It is recommended to use the knowledge from this video to become more involved with your lab results and evaluate your numbers to speak with your doctor.   Diseases of Our Time - Overview  Clinical staff conducted group or individual video education with verbal and written material and guidebook.  Patient learns that according to the CDC, 50% to 70% of chronic diseases (such as obesity, type 2 diabetes, elevated lipids, hypertension, and heart disease) are avoidable through lifestyle improvements including healthier food choices, listening to satiety cues, and increased physical activity.  Sleep Disorders Clinical staff conducted group or individual video education with verbal and written material and guidebook.  Patient learns how good quality and duration of sleep are important  to overall health and well-being. Patient also learns about sleep disorders and how they impact health along with recommendations to address them, including discussing with a physician.  Nutrition  Dining Out - Part 2 Clinical staff conducted group or individual video education with verbal and written material and guidebook.  Patient learns how to plan ahead and communicate in order to maximize their dining experience in a healthy and  nutritious manner. Included are recommended food choices based on the type of restaurant the patient is visiting.   Fueling a Banker conducted group or individual video education with verbal and written material and guidebook.  There is a strong connection between our food choices and our health. Diseases like obesity and type 2 diabetes are very prevalent and are in large-part due to lifestyle choices. The Pritikin Eating Plan provides plenty of food and hunger-curbing satisfaction. It is easy to follow, affordable, and helps reduce health risks.  Menu Workshop  Clinical staff conducted group or individual video education with verbal and written material and guidebook.  Patient learns that restaurant meals can sabotage health goals because they are often packed with calories, fat, sodium, and sugar. Recommendations include strategies to plan ahead and to communicate with the manager, chef, or server to help order a healthier meal.  Planning Your Eating Strategy  Clinical staff conducted group or individual video education with verbal and written material and guidebook.  Patient learns about the Pritikin Eating Plan and its benefit of reducing the risk of disease. The Pritikin Eating Plan does not focus on calories. Instead, it emphasizes high-quality, nutrient-rich foods. By knowing the characteristics of the foods, we choose, we can determine their calorie density and make informed decisions.  Targeting Your Nutrition Priorities  Clinical staff conducted group or individual video education with verbal and written material and guidebook.  Patient learns that lifestyle habits have a tremendous impact on disease risk and progression. This video provides eating and physical activity recommendations based on your personal health goals, such as reducing LDL cholesterol, losing weight, preventing or controlling type 2 diabetes, and reducing high blood pressure.  Vitamins and  Minerals  Clinical staff conducted group or individual video education with verbal and written material and guidebook.  Patient learns different ways to obtain key vitamins and minerals, including through a recommended healthy diet. It is important to discuss all supplements you take with your doctor.   Healthy Mind-Set    Smoking Cessation  Clinical staff conducted group or individual video education with verbal and written material and guidebook.  Patient learns that cigarette smoking and tobacco addiction pose a serious health risk which affects millions of people. Stopping smoking will significantly reduce the risk of heart disease, lung disease, and many forms of cancer. Recommended strategies for quitting are covered, including working with your doctor to develop a successful plan.  Culinary   Becoming a Set designer conducted group or individual video education with verbal and written material and guidebook.  Patient learns that cooking at home can be healthy, cost-effective, quick, and puts them in control. Keys to cooking healthy recipes will include looking at your recipe, assessing your equipment needs, planning ahead, making it simple, choosing cost-effective seasonal ingredients, and limiting the use of added fats, salts, and sugars.  Cooking - Breakfast and Snacks  Clinical staff conducted group or individual video education with verbal and written material and guidebook.  Patient learns how important breakfast is to satiety and nutrition through  the entire day. Recommendations include key foods to eat during breakfast to help stabilize blood sugar levels and to prevent overeating at meals later in the day. Planning ahead is also a key component.  Cooking - Educational psychologist conducted group or individual video education with verbal and written material and guidebook.  Patient learns eating strategies to improve overall health, including an approach  to cook more at home. Recommendations include thinking of animal protein as a side on your plate rather than center stage and focusing instead on lower calorie dense options like vegetables, fruits, whole grains, and plant-based proteins, such as beans. Making sauces in large quantities to freeze for later and leaving the skin on your vegetables are also recommended to maximize your experience.  Cooking - Healthy Salads and Dressing Clinical staff conducted group or individual video education with verbal and written material and guidebook.  Patient learns that vegetables, fruits, whole grains, and legumes are the foundations of the Pritikin Eating Plan. Recommendations include how to incorporate each of these in flavorful and healthy salads, and how to create homemade salad dressings. Proper handling of ingredients is also covered. Cooking - Soups and [employer] - Soups and Desserts Clinical staff conducted group or individual video education with verbal and written material and guidebook.  Patient learns that Pritikin soups and desserts make for easy, nutritious, and delicious snacks and meal components that are low in sodium, fat, sugar, and calorie density, while high in vitamins, minerals, and filling fiber. Recommendations include simple and healthy ideas for soups and desserts.   Overview     The Pritikin Solution Program Overview Clinical staff conducted group or individual video education with verbal and written material and guidebook.  Patient learns that the results of the Pritikin Program have been documented in more than 100 articles published in peer-reviewed journals, and the benefits include reducing risk factors for (and, in some cases, even reversing) high cholesterol, high blood pressure, type 2 diabetes, obesity, and more! An overview of the three key pillars of the Pritikin Program will be covered: eating well, doing regular exercise, and having a healthy  mind-set.  WORKSHOPS  Exercise: Exercise Basics: Building Your Action Plan Clinical staff led group instruction and group discussion with PowerPoint presentation and patient guidebook. To enhance the learning environment the use of posters, models and videos may be added. At the conclusion of this workshop, patients will comprehend the difference between physical activity and exercise, as well as the benefits of incorporating both, into their routine. Patients will understand the FITT (Frequency, Intensity, Time, and Type) principle and how to use it to build an exercise action plan. In addition, safety concerns and other considerations for exercise and cardiac rehab will be addressed by the presenter. The purpose of this lesson is to promote a comprehensive and effective weekly exercise routine in order to improve patients' overall level of fitness.   Managing Heart Disease: Your Path to a Healthier Heart Clinical staff led group instruction and group discussion with PowerPoint presentation and patient guidebook. To enhance the learning environment the use of posters, models and videos may be added.At the conclusion of this workshop, patients will understand the anatomy and physiology of the heart. Additionally, they will understand how Pritikin's three pillars impact the risk factors, the progression, and the management of heart disease.  The purpose of this lesson is to provide a high-level overview of the heart, heart disease, and how the Pritikin lifestyle positively impacts risk  factors.  Exercise Biomechanics Clinical staff led group instruction and group discussion with PowerPoint presentation and patient guidebook. To enhance the learning environment the use of posters, models and videos may be added. Patients will learn how the structural parts of their bodies function and how these functions impact their daily activities, movement, and exercise. Patients will learn how to promote a  neutral spine, learn how to manage pain, and identify ways to improve their physical movement in order to promote healthy living. The purpose of this lesson is to expose patients to common physical limitations that impact physical activity. Participants will learn practical ways to adapt and manage aches and pains, and to minimize their effect on regular exercise. Patients will learn how to maintain good posture while sitting, walking, and lifting.  Balance Training and Fall Prevention  Clinical staff led group instruction and group discussion with PowerPoint presentation and patient guidebook. To enhance the learning environment the use of posters, models and videos may be added. At the conclusion of this workshop, patients will understand the importance of their sensorimotor skills (vision, proprioception, and the vestibular system) in maintaining their ability to balance as they age. Patients will apply a variety of balancing exercises that are appropriate for their current level of function. Patients will understand the common causes for poor balance, possible solutions to these problems, and ways to modify their physical environment in order to minimize their fall risk. The purpose of this lesson is to teach patients about the importance of maintaining balance as they age and ways to minimize their risk of falling.  WORKSHOPS   Nutrition:  Fueling a Ship broker led group instruction and group discussion with PowerPoint presentation and patient guidebook. To enhance the learning environment the use of posters, models and videos may be added. Patients will review the foundational principles of the Pritikin Eating Plan and understand what constitutes a serving size in each of the food groups. Patients will also learn Pritikin-friendly foods that are better choices when away from home and review make-ahead meal and snack options. Calorie density will be reviewed and applied to  three nutrition priorities: weight maintenance, weight loss, and weight gain. The purpose of this lesson is to reinforce (in a group setting) the key concepts around what patients are recommended to eat and how to apply these guidelines when away from home by planning and selecting Pritikin-friendly options. Patients will understand how calorie density may be adjusted for different weight management goals.  Mindful Eating  Clinical staff led group instruction and group discussion with PowerPoint presentation and patient guidebook. To enhance the learning environment the use of posters, models and videos may be added. Patients will briefly review the concepts of the Pritikin Eating Plan and the importance of low-calorie dense foods. The concept of mindful eating will be introduced as well as the importance of paying attention to internal hunger signals. Triggers for non-hunger eating and techniques for dealing with triggers will be explored. The purpose of this lesson is to provide patients with the opportunity to review the basic principles of the Pritikin Eating Plan, discuss the value of eating mindfully and how to measure internal cues of hunger and fullness using the Hunger Scale. Patients will also discuss reasons for non-hunger eating and learn strategies to use for controlling emotional eating.  Targeting Your Nutrition Priorities Clinical staff led group instruction and group discussion with PowerPoint presentation and patient guidebook. To enhance the learning environment the use of posters, models and videos  may be added. Patients will learn how to determine their genetic susceptibility to disease by reviewing their family history. Patients will gain insight into the importance of diet as part of an overall healthy lifestyle in mitigating the impact of genetics and other environmental insults. The purpose of this lesson is to provide patients with the opportunity to assess their personal nutrition  priorities by looking at their family history, their own health history and current risk factors. Patients will also be able to discuss ways of prioritizing and modifying the Pritikin Eating Plan for their highest risk areas  Menu  Clinical staff led group instruction and group discussion with PowerPoint presentation and patient guidebook. To enhance the learning environment the use of posters, models and videos may be added. Using menus brought in from [employer], or printed from [employer], patients will apply the [facility] dining out guidelines that were presented in the [employer] video. Patients will also be able to practice these guidelines in a variety of provided scenarios. The purpose of this lesson is to provide patients with the opportunity to practice hands-on learning of the [facility] Dining Out guidelines with actual menus and practice scenarios.  Label Reading Clinical staff led group instruction and group discussion with PowerPoint presentation and patient guidebook. To enhance the learning environment the use of posters, models and videos may be added. Patients will review and discuss the [facility] label reading guidelines presented in [employer]'s Label Reading Educational series video. Using fool labels brought in from local grocery stores and markets, patients will apply the label reading guidelines and determine if the packaged food meet the [facility] guidelines. The purpose of this lesson is to provide patients with the opportunity to review, discuss, and practice hands-on learning of the [facility] Label Reading guidelines with actual packaged food labels. Cooking School  [employer]'s [employer] are designed to teach patients ways to prepare quick, simple, and affordable recipes at home. The importance of nutrition's role in chronic disease risk reduction is reflected in its emphasis in the overall [facility] program. By learning how to prepare essential  core [facility] Eating Plan recipes, patients will increase control over what they eat; be able to customize the flavor of foods without the use of added salt, sugar, or fat; and improve the quality of the food they consume. By learning a set of core recipes which are easily assembled, quickly prepared, and affordable, patients are more likely to prepare more healthy foods at home. These workshops focus on convenient breakfasts, simple entres, side dishes, and desserts which can be prepared with minimal effort and are consistent with nutrition recommendations for cardiovascular risk reduction. Cooking [employer] are taught by a Armed forces logistics/support/administrative officer (RD) who has been trained by the [employer]. The chef or RD has a clear understanding of the importance of minimizing - if not completely eliminating - added fat, sugar, and sodium in recipes. Throughout the series of Cooking School Workshop sessions, patients will learn about healthy ingredients and efficient methods of cooking to build confidence in their capability to prepare    Cooking School weekly topics:  Adding Flavor- Sodium-Free  Fast and Healthy Breakfasts  Powerhouse Plant-Based Proteins  Satisfying Salads and Dressings  Simple Sides and Sauces  International Cuisine-Spotlight on the [employer] Zones  Delicious Desserts  Savory Soups  [employer] - Meals in a Astronomer Appetizers and Snacks  Comforting Weekend Breakfasts  One-Pot Wonders   Fast Evening Meals  Easy  Entertaining  Personalizing Your Pritikin Plate  WORKSHOPS   Healthy Mindset (Psychosocial):  Focused Goals, Sustainable Changes Clinical staff led group instruction and group discussion with PowerPoint presentation and patient guidebook. To enhance the learning environment the use of posters, models and videos may be added. Patients will be able to apply effective goal setting strategies to establish at least one personal goal, and then take  consistent, meaningful action toward that goal. They will learn to identify common barriers to achieving personal goals and develop strategies to overcome them. Patients will also gain an understanding of how our mind-set can impact our ability to achieve goals and the importance of cultivating a positive and growth-oriented mind-set. The purpose of this lesson is to provide patients with a deeper understanding of how to set and achieve personal goals, as well as the tools and strategies needed to overcome common obstacles which may arise along the way.  From Head to Heart: The Power of a Healthy Outlook  Clinical staff led group instruction and group discussion with PowerPoint presentation and patient guidebook. To enhance the learning environment the use of posters, models and videos may be added. Patients will be able to recognize and describe the impact of emotions and mood on physical health. They will discover the importance of self-care and explore self-care practices which may work for them. Patients will also learn how to utilize the 4 C's to cultivate a healthier outlook and better manage stress and challenges. The purpose of this lesson is to demonstrate to patients how a healthy outlook is an essential part of maintaining good health, especially as they continue their cardiac rehab journey.  Healthy Sleep for a Healthy Heart Clinical staff led group instruction and group discussion with PowerPoint presentation and patient guidebook. To enhance the learning environment the use of posters, models and videos may be added. At the conclusion of this workshop, patients will be able to demonstrate knowledge of the importance of sleep to overall health, well-being, and quality of life. They will understand the symptoms of, and treatments for, common sleep disorders. Patients will also be able to identify daytime and nighttime behaviors which impact sleep, and they will be able to apply these tools to help  manage sleep-related challenges. The purpose of this lesson is to provide patients with a general overview of sleep and outline the importance of quality sleep. Patients will learn about a few of the most common sleep disorders. Patients will also be introduced to the concept of "sleep hygiene," and discover ways to self-manage certain sleeping problems through simple daily behavior changes. Finally, the workshop will motivate patients by clarifying the links between quality sleep and their goals of heart-healthy living.   Recognizing and Reducing Stress Clinical staff led group instruction and group discussion with PowerPoint presentation and patient guidebook. To enhance the learning environment the use of posters, models and videos may be added. At the conclusion of this workshop, patients will be able to understand the types of stress reactions, differentiate between acute and chronic stress, and recognize the impact that chronic stress has on their health. They will also be able to apply different coping mechanisms, such as reframing negative self-talk. Patients will have the opportunity to practice a variety of stress management techniques, such as deep abdominal breathing, progressive muscle relaxation, and/or guided imagery.  The purpose of this lesson is to educate patients on the role of stress in their lives and to provide healthy techniques for coping with it.  Learning Barriers/Preferences:  Learning Barriers/Preferences - 12/06/22 1205       Learning Barriers/Preferences   Learning Barriers Exercise Concerns;Sight;Hearing   Wears glasses, hearing loss does not have a hearing aide at this time. fell 2 weeks ago has problems with balance due to back problems   Learning Preferences Individual Instruction;Pictoral;Skilled Demonstration;Verbal Instruction;Written Material;Audio             Education Topics:  Knowledge Questionnaire Score:  Knowledge Questionnaire Score - 12/06/22 1126        Knowledge Questionnaire Score   Pre Score 26/28             Core Components/Risk Factors/Patient Goals at Admission:  Personal Goals and Risk Factors at Admission - 12/06/22 1118       Core Components/Risk Factors/Patient Goals on Admission   Tobacco Cessation Yes    Number of packs per day 1/2 a cigarette / week    Intervention Assist the participant in steps to quit. Provide individualized education and counseling about committing to Tobacco Cessation, relapse prevention, and pharmacological support that can be provided by physician.;Education officer, environmental, assist with locating and accessing local/national Quit Smoking programs, and support quit date choice.    Expected Outcomes Short Term: Will demonstrate readiness to quit, by selecting a quit date.;Long Term: Complete abstinence from all tobacco products for at least 12 months from quit date.;Short Term: Will quit all tobacco product use, adhering to prevention of relapse plan.    Hypertension Yes    Intervention Provide education on lifestyle modifcations including regular physical activity/exercise, weight management, moderate sodium restriction and increased consumption of fresh fruit, vegetables, and low fat dairy, alcohol moderation, and smoking cessation.;Monitor prescription use compliance.    Expected Outcomes Short Term: Continued assessment and intervention until BP is < 140/68mm HG in hypertensive participants. < 130/69mm HG in hypertensive participants with diabetes, heart failure or chronic kidney disease.;Long Term: Maintenance of blood pressure at goal levels.    Lipids Yes    Intervention Provide education and support for participant on nutrition & aerobic/resistive exercise along with prescribed medications to achieve LDL 70mg , HDL >40mg .    Expected Outcomes Short Term: Participant states understanding of desired cholesterol values and is compliant with medications prescribed. Participant is following  exercise prescription and nutrition guidelines.;Long Term: Cholesterol controlled with medications as prescribed, with individualized exercise RX and with personalized nutrition plan. Value goals: LDL < 70mg , HDL > 40 mg.    Stress Yes    Intervention Offer individual and/or small group education and counseling on adjustment to heart disease, stress management and health-related lifestyle change. Teach and support self-help strategies.;Refer participants experiencing significant psychosocial distress to appropriate mental health specialists for further evaluation and treatment. When possible, include family members and significant others in education/counseling sessions.    Expected Outcomes Short Term: Participant demonstrates changes in health-related behavior, relaxation and other stress management skills, ability to obtain effective social support, and compliance with psychotropic medications if prescribed.;Long Term: Emotional wellbeing is indicated by absence of clinically significant psychosocial distress or social isolation.    Personal Goal Other Yes    Personal Goal Be able to walk longer and more briskly. Gain confidence with exercise.    Intervention Develop an exercise action plan that Trinyti can do at home to help build stamina and aerobic fitness, so that she can walk longer and more briskly.    Expected Outcomes Jeanene will be more confident with her exercise routine, and she will be able to walk longer and faster  as measured by self-report and 6-minute walk test.             Core Components/Risk Factors/Patient Goals Review:   Goals and Risk Factor Review     Row Name 12/15/22 1303 01/11/23 0957           Core Components/Risk Factors/Patient Goals Review   Personal Goals Review Weight Management/Obesity;Stress;Tobacco Cessation;Lipids;Hypertension Weight Management/Obesity;Stress;Tobacco Cessation;Lipids;Hypertension      Review Channah started cardiac rehab on 12/14/22  and did well with exercise. Vital signs were stable. Saralyn last session at  cardiac rehab was on 12/26/22. Steve has been absent with an URI.      Expected Outcomes Johannah will continue to participate in cardiac rehab for exercise, nutrition and lifestyle modifications Jenaya will continue to participate in cardiac rehab for exercise, nutrition and lifestyle modifications               Core Components/Risk Factors/Patient Goals at Discharge (Final Review):   Goals and Risk Factor Review - 01/11/23 0957       Core Components/Risk Factors/Patient Goals Review   Personal Goals Review Weight Management/Obesity;Stress;Tobacco Cessation;Lipids;Hypertension    Review Gearl last session at  cardiac rehab was on 12/26/22. Macilynn has been absent with an URI.    Expected Outcomes Matsue will continue to participate in cardiac rehab for exercise, nutrition and lifestyle modifications             ITP Comments:  ITP Comments     Row Name 12/06/22 1013 12/14/22 1628 01/11/23 0956       ITP Comments Medical Director- Dr. Armanda Magic, MD. Introduction to the [facility] Education Program / Intensive Cardiac Rehab. Reviewed intial orientation folder. 30 Day ITP Review. Porshea started cardiac rehab on 12/14/22 and did well with exercise, 30 Day ITP Review. Sheny has been absent due from cardiac rehab due to an URI              Comments: See ITP comments.Thayer Headings RN BSN

## 2023-01-11 NOTE — Patient Instructions (Signed)
Continue current medications Continue to follow up with your doctors  Return in a year

## 2023-01-11 NOTE — Progress Notes (Signed)
PATIENT: Wendy Lopez DOB: 11-13-1960  REASON FOR VISIT: follow up HISTORY FROM: patient Primary Neurologist: Dr. Teresa Coombs   Today 01/11/23 Patient presents today for follow-up, she is alone.  Last visit was in March, since then she has been doing well, she is compliant with her Keppra 750 mg twice daily.  She reports being admitted to hospital in July for STEMI.  Since discharge from the hospital she has been doing well, stated that her headaches actually improved improved.  Again no seizure or seizure activity.  She continues following up with doctors.  No other complaint.  Moca today 28.   INTERVAL HISTORY 07/11/2022 Patient presents today for follow-up, last visit was in November, at that time we increased the Keppra 750 mg twice daily.  Since then she denies any seizure or seizure-like activity.  She still complaining of headaches, and some neck tightness.  She reports a long history of neck pain, at some point she even did receive steroid injection.  Stated that some of her headaches are located on the back and of the head.  Again no seizures. Her PCP has recently recheck her vitamin B12 and D level and they were still low.  She was taking vitamin B-12 supplement but her levels are still low.  She will discuss with her PCP regarding B12 injection   INTERVAL HISTORY 02/25/2022:  Patient was called for follow up. At last visit, plan was to switch Dilantin to Topiramate. She reports doing the switch but she cannot tolerate the Topiramate. Topamax gave her side effects including: Dizziness, tingling and hands, bitter taste, cannot eat anything, soda tastes really bad. She is still on Keppra 500 mg BID.  In the past she had try Depakote but had a really severe allergic reaction. She also had an allergy to Carbamazepine.  She denies any seizure or seizure like activities and reports that her headaches have gone away since starting the Topiramate. She is worried headaches will come back if  she discontinued the Topiramate.   INTERVAL HISTORY 12/08/21 Erin presents today for follow-up, last visit was in February.  At that time, due to side effect of Keppra we decided to switch over to Briviact.  The medication was approved by her insurance but her co-pay was almost $500 a month which she could not afford.  She was put back on the Keppra and Dilantin.  Denies any additional seizures but still experience the side effect.  She would like one day to come off Keppra and Dilantin if possible.  Her current complaint at the moment is worsening migraines.  In the past she was put on Trokendi but reported have side effect, Trokendi made her migraine worse. She is willing to try it again (Topamax)   INTERVAL HISTORY 06/09/2021: Patient present today for follow-up, last visit was in July.  At that time she reported doing well on Keppra and Dilantin, no seizures.  She reported in January she was diagnosed with infection and was put on Levaquin, while on Levaquin she had 2 of her typical seizure with the aura, no convulsions.  Patient report with the Keppra she had personality changes, she cannot focus, cannot concentrate she is irritable and would like to switch medication.  Discussed about other type of medication but she has allergy to the failure on general medication.  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   INTERVAL HISTORY 10/26/2020 Wendy Lopez is a 62 year old female with  history of seizures and migraine headaches. Had dizziness last year, MRI of the brain showed few white matter lesions, nonspecific, could be related to migraine history. Requires brand name medications, highly allergic to filler in generic medications, on Dilantin, but generic Keppra. Took brand name Trokendi for headaches, reports facial swelling, breaking out, tremors, stopped it. Had headaches up until March, gradually continues to improve. Last week had migraine, has  vomiting with migraines, doesn't want more medications due to high sensitivity. Had Dilantin level checked 10/20/20, Dilantin level 8.4. Claims pre-seizure feeling last week, foggy, sensation/squeezing in neck and chest. 4 grand mal seizures in her life, most have been partial. Wonders if brand name medication Dilantin has been changed to account for recent feeling. Here today alone.   Reviewed blood work from PCP collected October 20, 2020: Dilantin level was 8.4, Keppra level was 23.7, CMP was normal, CBC showed slightly elevated HCT 47.2, platelet level mildly low 136, TSH 1.08.  Update 03/02/2020 SS: Ms. Wendy Lopez is a 62 year old female with history of seizures, last seizure was in August 2020, she had awakened and had bitten her tongue, her Dilantin was increased.  She is on Dilantin and Keppra.  She is highly allergic to, filler in generic medications, but is tolerating generic Keppra, has found a specific manufacturer (Lupen) works well for her.  Today, reports return of her migraines, that she was experiencing her 30s, also dizziness (floaty sensation), hard to say exactly when it started, but probably around May.  Headache is bilateral, pounding, photophobia, phonophobia, nausea, occasional vomiting.  Reports daily headache, usually not in the morning, could be anytime throughout the day.  Unable to identify triggers.  Headache and dizziness are related.  Taking daily Excedrin Migraine.  Usually dizziness is brought on by movement on the screen (TV/phone), pending appointment with ophthalmology.  Has seen PCP, CT head in October was unremarkable.  Since June, noted decreased sensation/numbness to her right lower face, no facial droop or slurred speech.  Has had several falls related to dizziness.  No reported changes in medications.  Has seen cardiology, heart monitor is pending.  Had Covid vaccine 2nd dose May 10th, that night, had fever 103, shaking, had dj vu spell, which can be associated with seizure.   Had a tremor in both hands for 8 weeks, that went away.  Presents today for evaluation unaccompanied.  HISTORY 06/05/2019 SS: Ms. Wendy Lopez is a 62 year old female with history of seizures.  When last seen, she had likely seizure episode, when she had awakened and had bitten her tongue.  Her Dilantin level was increased with a 30 mg capsule, she remains on Keppra.  She indicates since increasing her dose of Dilantin, she is actually feeling a lot better, has been having more energy, she is very pleased.  She is taking generic Keppra, has a Conservation officer, historic buildings (Lupen) that works for her.  She says she is highly allergic to a common filler in generic medications.  Her allergy has been so significant, at times has had to use EpiPen.  She has not had recurrent seizure.  She is working with pharmacy, unsure, how much more medication she will be able to receive from this manufacturer.  In the past, she has been on brand-name Keppra, but insurance did not pay.  She presents today for follow-up unaccompanied.   REVIEW OF SYSTEMS: Out of a complete 14 system review of symptoms, the patient complains only of the following symptoms, and all other reviewed systems are negative.  See HPI  ALLERGIES: Allergies  Allergen Reactions   Carbamazepine Anaphylaxis   Lamictal [Lamotrigine] Anaphylaxis    SOB, Lamotrigine only   Omeprazole Anaphylaxis   Other     STATES HAS SHORTNESS OF BREATH WITH GENERIC DRUGS     ALSO RASH AND ITCHING   Pantoprazole Anaphylaxis   Prednisone Other (See Comments), Rash and Shortness Of Breath    Prescribing MD told patient he thinks dose (dose pack) was "just too high.", 03/09/11   STATES HAS TAKEN SMALLER DOSE AND STILL HAD RASH AND JITTERINESS, Prescribing MD told patient he thinks dose (dose pack) was "just too high.", 03/09/11   STATES HAS TAKEN SMALLER DOSE AND STILL HAD RASH AND JITTERINESS   Proton Pump Inhibitors Anaphylaxis   Pseudoephedrine Hcl Er Other (See Comments)     SEIZURES.   Valium Other (See Comments)    Hallucinations, spasticity, hyper-relfexive    Amlodipine Rash    Face swell   Oxycodone Nausea And Vomiting, Rash and Other (See Comments)   Ibuprofen Swelling   Percocet [Oxycodone-Acetaminophen] Nausea And Vomiting   Rosuvastatin Diarrhea   Statins     myalgia   Trokendi [employer]] Other (See Comments)    Made headache worse, dizziness, difficulty breathing, rash   Betadine [Povidone Iodine] Rash   Latex Dermatitis and Rash   Shellfish Allergy Swelling    HOME MEDICATIONS: Outpatient Medications Prior to Visit  Medication Sig Dispense Refill   aspirin EC 81 MG tablet Take 1 tablet (81 mg total) by mouth daily. Swallow whole. 120 tablet 3   Cyanocobalamin (VITAMIN B-12) 3000 MCG/ML LIQD Inject 3,000 mcg as directed every 30 (thirty) days.     EPINEPHrine 0.3 mg/0.3 mL IJ SOAJ injection Inject 0.3 mg into the muscle as needed for anaphylaxis.     gabapentin (NEURONTIN) 100 MG capsule Take 1 capsule (100 mg total) by mouth at bedtime. 30 capsule 0   isosorbide mononitrate (IMDUR) 30 MG 24 hr tablet Take 0.5 tablets (15 mg total) by mouth daily. 45 tablet 1   metoprolol succinate (TOPROL-XL) 100 MG 24 hr tablet TAKE 1 TABLET(100 MG) BY MOUTH DAILY WITH OR IMMEDIATELY FOLLOWING A MEAL 90 tablet 3   nicotine (NICODERM CQ - DOSED IN MG/24 HOURS) 14 mg/24hr patch Place 1 patch (14 mg total) onto the skin daily. 30 patch 0   nitroGLYCERIN (NITROSTAT) 0.4 MG SL tablet Place 1 tablet (0.4 mg total) under the tongue every 5 (five) minutes as needed. 25 tablet 2   sodium chloride (OCEAN) 0.65 % nasal spray Place 1-2 sprays into the nose as needed for congestion.     ticagrelor (BRILINTA) 90 MG TABS tablet Take 1 tablet (90 mg total) by mouth 2 (two) times daily. 180 tablet 2   Tiotropium Bromide-Olodaterol (STIOLTO RESPIMAT) 2.5-2.5 MCG/ACT AERS Inhale 2 puffs into the lungs daily. 4 g 5   ZYRTEC ALLERGY 10 MG tablet Take 10 mg by mouth at  bedtime as needed for allergies.     levETIRAcetam (KEPPRA) 750 MG tablet Take 1 tablet (750 mg total) by mouth 2 (two) times daily. 60 tablet 11   atorvastatin (LIPITOR) 80 MG tablet Take 1 tablet (80 mg total) by mouth daily. (Patient taking differently: Take 80 mg by mouth daily. Pt says takes have pt tab 40mg ) 90 tablet 1   losartan (COZAAR) 100 MG tablet Take 1 tablet (100 mg total) by mouth daily. (Patient not taking: Reported on 11/30/2022) 30 tablet 1   ondansetron (ZOFRAN-ODT) 4  MG disintegrating tablet Take 1 tablet (4 mg total) by mouth every 8 (eight) hours as needed for nausea or vomiting. (Patient not taking: Reported on 01/11/2023) 20 tablet 0   No facility-administered medications prior to visit.    PAST MEDICAL HISTORY: Past Medical History:  Diagnosis Date   Anxiety and depression    Arthritis    Breast cancer (HCC) 02/13/2009   Cancer of right breast (HCC) 04/16/2012   Coronary artery disease    Depression    Depression 04/18/2012   Dysrhythmia    palpitations   Epilepsy (HCC)    last seizure 1990's   H/O hiatal hernia    Hyperlipidemia    Hypertension    Localization-related (focal) (partial) epilepsy and epileptic syndromes with complex partial seizures, without mention of intractable epilepsy 02/07/2013   Memory deficit 02/07/2013   Memory loss    Mitral valve prolapse 06/01/2010   echo- EF>55% mild tricupsid regurgitation. There is mild Pulmonary Hypertension.. Right  ventricular systolic pressure iselevated at 30-40 mmHg. LV systolic function is normal.   Osteopenia    PONV (postoperative nausea and vomiting)    SOB (shortness of breath) 10/18/2011   met-test   Spinal fusion failure    3    PAST SURGICAL HISTORY: Past Surgical History:  Procedure Laterality Date   BACK SURGERY     screws- lumbar   BREAST SURGERY  04/18/2008   mastectomy - RIGHT   BRONCHIAL WASHINGS  04/01/2021   Procedure: BRONCHIAL WASHINGS;  Surgeon: Josephine Igo, DO;   Location: [facility];  Service: Cardiopulmonary;;   CARDIAC CATHETERIZATION     COLONOSCOPY WITH PROPOFOL  03/13/2012   Procedure: COLONOSCOPY WITH PROPOFOL;  Surgeon: Charolett Bumpers, MD;  Location: [facility];  Service: Endoscopy;  Laterality: N/A;   CORONARY/GRAFT ACUTE MI REVASCULARIZATION N/A 10/23/2022   Procedure: Coronary/Graft Acute MI Revascularization;  Surgeon: Kathleene Hazel, MD;  Location: [facility];  Service: Cardiovascular;  Laterality: N/A;   ESOPHAGOGASTRODUODENOSCOPY  03/13/2012   Procedure: ESOPHAGOGASTRODUODENOSCOPY (EGD);  Surgeon: Charolett Bumpers, MD;  Location: [facility];  Service: Endoscopy;  Laterality: N/A;  Reflux, Hiatal Hernia   HAND TENDON SURGERY  04/18/2010   tendons released in right hand    LEFT HEART CATH AND CORONARY ANGIOGRAPHY N/A 10/23/2022   Procedure: LEFT HEART CATH AND CORONARY ANGIOGRAPHY;  Surgeon: Kathleene Hazel, MD;  Location: [facility];  Service: Cardiovascular;  Laterality: N/A;   LEFT HEART CATH AND CORONARY ANGIOGRAPHY N/A 10/26/2022   Procedure: LEFT HEART CATH AND CORONARY ANGIOGRAPHY;  Surgeon: Orbie Pyo, MD;  Location: [facility];  Service: Cardiovascular;  Laterality: N/A;   MASTECTOMY, RADICAL  02/13/2009   SPINE SURGERY  1999, 2003, 2005   TONSILLECTOMY     VIDEO BRONCHOSCOPY Bilateral 04/01/2021   Procedure: VIDEO BRONCHOSCOPY WITHOUT FLUORO;  Surgeon: Josephine Igo, DO;  Location: [facility];  Service: Cardiopulmonary;  Laterality: Bilateral;  Ultrathin bronchoscopy    FAMILY HISTORY: Family History  Problem Relation Age of Onset   Arthritis Mother        rhuematoid   Heart disease Mother        Atrial fib   Heart disease Father    Heart disease Sister        Atrial fib    SOCIAL HISTORY: Social History   Socioeconomic History   Marital status: Married    Spouse name: Not on file   Number of children: 1   Years  of education: college 3   Highest  education level: Not on file  Occupational History   Occupation: unemployed    Employer: UNEMPLOYED    Comment: 2003  Tobacco Use   Smoking status: Some Days    Current packs/day: 1.00    Average packs/day: 1 pack/day for 43.0 years (43.0 ttl pk-yrs)    Types: Cigarettes   Smokeless tobacco: Never   Tobacco comments:    Smokes a 1/2 of a cigarette a week. Has talked to [facility]. Offered virtual cessation classes declined. 12/06/22 M[facility] RN  Substance and Sexual Activity   Alcohol use: No    Alcohol/week: 0.0 standard drinks of alcohol   Drug use: No   Sexual activity: Not on file  Other Topics Concern   Not on file  Social History Narrative   Patient lives at home with husband   Patient is right handed.   Patient does not drink caffeine.   Social Determinants of Health   Financial Resource Strain: Not on file  Food Insecurity: No Food Insecurity (10/27/2022)   Hunger Vital Sign    Worried About Running Out of Food in the Last Year: Never true    Ran Out of Food in the Last Year: Never true  Transportation Needs: No Transportation Needs (10/27/2022)   PRAPARE - Administrator, Civil Service (Medical): No    Lack of Transportation (Non-Medical): No  Physical Activity: Not on file  Stress: Not on file  Social Connections: Not on file  Intimate Partner Violence: Not At Risk (10/27/2022)   Humiliation, Afraid, Rape, and Kick questionnaire    Fear of Current or Ex-Partner: No    Emotionally Abused: No    Physically Abused: No    Sexually Abused: No   PHYSICAL EXAM  Vitals:   01/11/23 1142 01/11/23 1156  BP: (!) 144/87 (!) 140/84  Pulse: 74 66  Weight: 144 lb 8 oz (65.5 kg)   Height: 5\' 2"  (1.575 m)      Body mass index is 26.43 kg/m.  Generalized: Well developed, in no acute distress   Neurological examination  Mentation: Alert oriented to time, place, history taking. Follows all commands speech and language fluent     01/11/2023   11:43 AM   Montreal Cognitive Assessment   Visuospatial/ Executive (0/5) 5  Naming (0/3) 3  Attention: Read list of digits (0/2) 2  Attention: Read list of letters (0/1) 1  Attention: Serial 7 subtraction starting at 100 (0/3) 3  Language: Repeat phrase (0/2) 1  Language : Fluency (0/1) 1  Abstraction (0/2) 2  Delayed Recall (0/5) 4  Orientation (0/6) 6  Total 28     DIAGNOSTIC DATA (LABS, IMAGING, TESTING) - I reviewed patient records, labs, notes, testing and imaging myself where available.  Lab Results  Component Value Date   WBC 12.6 (H) 10/27/2022   HGB 14.9 10/27/2022   HCT 43.4 10/27/2022   MCV 89.3 10/27/2022   PLT 143 (L) 10/27/2022      Component Value Date/Time   NA 136 10/28/2022 0826   NA 143 03/02/2020 0917   K 3.8 10/28/2022 0826   CL 100 10/28/2022 0826   CO2 24 10/28/2022 0826   GLUCOSE 97 10/28/2022 0826   BUN 18 10/28/2022 0826   BUN 11 03/02/2020 0917   CREATININE 0.86 10/28/2022 0826   CALCIUM 8.6 (L) 10/28/2022 0826   PROT 6.7 10/27/2022 0953   PROT 6.3 03/02/2020 0917   ALBUMIN 3.6 10/27/2022 6433  ALBUMIN 4.1 03/02/2020 0917   AST 41 10/27/2022 0953   ALT 42 10/27/2022 0953   ALKPHOS 66 10/27/2022 0953   BILITOT 1.6 (H) 10/27/2022 0953   BILITOT <0.2 03/02/2020 0917   GFRNONAA >60 10/28/2022 0826   GFRAA 110 03/02/2020 0917   Lab Results  Component Value Date   CHOL 197 10/26/2022   HDL 70 10/26/2022   LDLCALC 113 (H) 10/26/2022   TRIG 71 10/26/2022   CHOLHDL 2.8 10/26/2022   Lab Results  Component Value Date   HGBA1C 5.6 10/26/2022   Lab Results  Component Value Date   VITAMINB12 246 06/14/2022   Lab Results  Component Value Date   TSH 1.625 06/14/2022    ASSESSMENT AND PLAN 62 y.o. year old female  has a past medical history of Anxiety and depression, Arthritis, Breast cancer (HCC) (02/13/2009), Cancer of right breast (HCC) (04/16/2012), Coronary artery disease, Depression, Depression (04/18/2012), Dysrhythmia, Epilepsy (HCC),  H/O hiatal hernia, Hyperlipidemia, Hypertension, Localization-related (focal) (partial) epilepsy and epileptic syndromes with complex partial seizures, without mention of intractable epilepsy (02/07/2013), Memory deficit (02/07/2013), Memory loss, Mitral valve prolapse (06/01/2010), Osteopenia, PONV (postoperative nausea and vomiting), SOB (shortness of breath) (10/18/2011), and Spinal fusion failure. here with:  1.  History of seizures 2.  Headache 3. Cervicalgia    Patient Instructions  Continue current medications Continue to follow up with your doctors  Return in a year     Windell Norfolk, MD 01/11/2023, 12:50 PM [facility] [address], [address] [place], [place] [address] [phone]

## 2023-01-13 ENCOUNTER — Encounter (HOSPITAL_COMMUNITY): Payer: Medicare Other

## 2023-01-16 ENCOUNTER — Encounter (HOSPITAL_COMMUNITY): Payer: Medicare Other

## 2023-01-18 ENCOUNTER — Encounter (HOSPITAL_COMMUNITY)
Admission: RE | Admit: 2023-01-18 | Discharge: 2023-01-18 | Disposition: A | Discharge status: Home / Self Care | Payer: Medicare Other | Source: Ambulatory Visit | Attending: Cardiovascular Disease | Admitting: Cardiovascular Disease

## 2023-01-18 DIAGNOSIS — I2111 ST elevation (STEMI) myocardial infarction involving right coronary artery: Secondary | ICD-10-CM | POA: Diagnosis not present

## 2023-01-18 DIAGNOSIS — Z955 Presence of coronary angioplasty implant and graft: Secondary | ICD-10-CM | POA: Diagnosis not present

## 2023-01-20 ENCOUNTER — Encounter (HOSPITAL_COMMUNITY): Payer: Medicare Other

## 2023-01-21 ENCOUNTER — Other Ambulatory Visit: Payer: Self-pay | Admitting: Cardiology

## 2023-01-23 ENCOUNTER — Encounter (HOSPITAL_COMMUNITY): Payer: Medicare Other

## 2023-01-25 ENCOUNTER — Telehealth (HOSPITAL_COMMUNITY): Payer: Self-pay

## 2023-01-25 ENCOUNTER — Encounter (HOSPITAL_COMMUNITY): Payer: Medicare Other

## 2023-01-25 NOTE — Telephone Encounter (Signed)
Kieren called and can not make it to cardiac rehab today. She will not make it in time. I told her I would canceled today's appointment and let the EP's know!

## 2023-01-27 ENCOUNTER — Encounter (HOSPITAL_COMMUNITY): Payer: Medicare Other

## 2023-01-30 ENCOUNTER — Other Ambulatory Visit (HOSPITAL_COMMUNITY): Payer: Self-pay | Admitting: Hematology

## 2023-01-30 ENCOUNTER — Telehealth (HOSPITAL_COMMUNITY): Payer: Self-pay

## 2023-01-30 ENCOUNTER — Encounter (HOSPITAL_COMMUNITY): Payer: Medicare Other

## 2023-01-30 DIAGNOSIS — Z1231 Encounter for screening mammogram for malignant neoplasm of breast: Secondary | ICD-10-CM

## 2023-01-30 NOTE — Telephone Encounter (Signed)
Pt called and is wanting to hold off on exercise this week as she does not have the finances to pay right now. She stated " It has been catastrophe after catastrophe." She is going to call [facility] billing dept to see if there is a way to do anything as she wants to stay in the program. She also expressed her sister pass away over the weekend so she can not come this week.

## 2023-02-01 ENCOUNTER — Encounter (HOSPITAL_COMMUNITY): Payer: Medicare Other

## 2023-02-02 ENCOUNTER — Telehealth (HOSPITAL_COMMUNITY): Payer: Self-pay | Admitting: *Deleted

## 2023-02-02 ENCOUNTER — Ambulatory Visit (HOSPITAL_COMMUNITY)
Admission: RE | Admit: 2023-02-02 | Discharge: 2023-02-02 | Disposition: A | Discharge status: Home / Self Care | Payer: Medicare Other | Source: Ambulatory Visit | Attending: Acute Care | Admitting: Acute Care

## 2023-02-02 ENCOUNTER — Inpatient Hospital Stay: Payer: Medicare Other | Attending: Hematology

## 2023-02-02 DIAGNOSIS — Z853 Personal history of malignant neoplasm of breast: Secondary | ICD-10-CM | POA: Insufficient documentation

## 2023-02-02 DIAGNOSIS — Z122 Encounter for screening for malignant neoplasm of respiratory organs: Secondary | ICD-10-CM | POA: Insufficient documentation

## 2023-02-02 DIAGNOSIS — E559 Vitamin D deficiency, unspecified: Secondary | ICD-10-CM | POA: Diagnosis not present

## 2023-02-02 DIAGNOSIS — Z170 Estrogen receptor positive status [ER+]: Secondary | ICD-10-CM

## 2023-02-02 DIAGNOSIS — F17210 Nicotine dependence, cigarettes, uncomplicated: Secondary | ICD-10-CM | POA: Insufficient documentation

## 2023-02-02 DIAGNOSIS — Z87891 Personal history of nicotine dependence: Secondary | ICD-10-CM | POA: Insufficient documentation

## 2023-02-02 DIAGNOSIS — E538 Deficiency of other specified B group vitamins: Secondary | ICD-10-CM | POA: Diagnosis not present

## 2023-02-02 DIAGNOSIS — D751 Secondary polycythemia: Secondary | ICD-10-CM | POA: Diagnosis not present

## 2023-02-02 DIAGNOSIS — D513 Other dietary vitamin B12 deficiency anemia: Secondary | ICD-10-CM

## 2023-02-02 LAB — CBC WITH DIFFERENTIAL/PLATELET
Abs Immature Granulocytes: 0.02 10*3/uL (ref 0.00–0.07)
Basophils Absolute: 0.1 10*3/uL (ref 0.0–0.1)
Basophils Relative: 1 %
Eosinophils Absolute: 0.3 10*3/uL (ref 0.0–0.5)
Eosinophils Relative: 3 %
HCT: 49.4 % — ABNORMAL HIGH (ref 36.0–46.0)
Hemoglobin: 16.2 g/dL — ABNORMAL HIGH (ref 12.0–15.0)
Immature Granulocytes: 0 %
Lymphocytes Relative: 44 %
Lymphs Abs: 3.7 10*3/uL (ref 0.7–4.0)
MCH: 29 pg (ref 26.0–34.0)
MCHC: 32.8 g/dL (ref 30.0–36.0)
MCV: 88.5 fL (ref 80.0–100.0)
Monocytes Absolute: 0.9 10*3/uL (ref 0.1–1.0)
Monocytes Relative: 11 %
Neutro Abs: 3.5 10*3/uL (ref 1.7–7.7)
Neutrophils Relative %: 41 %
Platelets: 189 10*3/uL (ref 150–400)
RBC: 5.58 MIL/uL — ABNORMAL HIGH (ref 3.87–5.11)
RDW: 13.3 % (ref 11.5–15.5)
WBC: 8.5 10*3/uL (ref 4.0–10.5)
nRBC: 0 % (ref 0.0–0.2)

## 2023-02-02 LAB — VITAMIN B12: Vitamin B-12: 310 pg/mL (ref 180–914)

## 2023-02-02 LAB — COMPREHENSIVE METABOLIC PANEL
ALT: 17 U/L (ref 0–44)
AST: 19 U/L (ref 15–41)
Albumin: 3.9 g/dL (ref 3.5–5.0)
Alkaline Phosphatase: 89 U/L (ref 38–126)
Anion gap: 9 (ref 5–15)
BUN: 12 mg/dL (ref 8–23)
CO2: 26 mmol/L (ref 22–32)
Calcium: 8.7 mg/dL — ABNORMAL LOW (ref 8.9–10.3)
Chloride: 106 mmol/L (ref 98–111)
Creatinine, Ser: 0.88 mg/dL (ref 0.44–1.00)
GFR, Estimated: >60 mL/min (ref >60)
Glucose, Bld: 98 mg/dL (ref 70–99)
Potassium: 3.8 mmol/L (ref 3.5–5.1)
Sodium: 141 mmol/L (ref 135–145)
Total Bilirubin: 1.2 mg/dL (ref 0.3–1.2)
Total Protein: 6.9 g/dL (ref 6.5–8.1)

## 2023-02-02 NOTE — Telephone Encounter (Signed)
Left message to call cardiac rehab regarding plans to stop participation and to offer condolences regarding Laveta's sister.Thayer Headings RN BSN

## 2023-02-03 ENCOUNTER — Encounter (HOSPITAL_COMMUNITY): Payer: Medicare Other

## 2023-02-06 ENCOUNTER — Encounter (HOSPITAL_COMMUNITY): Payer: Medicare Other

## 2023-02-08 ENCOUNTER — Encounter (HOSPITAL_COMMUNITY)
Admission: RE | Admit: 2023-02-08 | Discharge: 2023-02-08 | Disposition: A | Discharge status: Home / Self Care | Payer: Medicare Other | Source: Ambulatory Visit | Attending: Cardiovascular Disease | Admitting: Cardiovascular Disease

## 2023-02-08 DIAGNOSIS — I2111 ST elevation (STEMI) myocardial infarction involving right coronary artery: Secondary | ICD-10-CM | POA: Diagnosis not present

## 2023-02-08 DIAGNOSIS — Z955 Presence of coronary angioplasty implant and graft: Secondary | ICD-10-CM | POA: Diagnosis not present

## 2023-02-08 NOTE — Progress Notes (Signed)
Cardiac Individual Treatment Plan  Patient Details  Name: Wendy Lopez MRN: [id number] Date of Birth: 03-May-1960 Referring Provider:   Flowsheet Row INTENSIVE CARDIAC REHAB ORIENT from 12/06/2022 in [facility]  Referring Provider Lennette Bihari, MD       Initial Encounter Date:  Flowsheet Row INTENSIVE CARDIAC REHAB ORIENT from 12/06/2022 in [facility] for [facility], V[facility], [facility]  Date 12/06/22       Visit Diagnosis: 10/23/22 STEMI  10/23/22 PCI / DES RCA  Patient's Home Medications on Admission:  Current Outpatient Medications:    aspirin EC 81 MG tablet, Take 1 tablet (81 mg total) by mouth daily. Swallow whole., Disp: 120 tablet, Rfl: 3   Cyanocobalamin (VITAMIN B-12) 3000 MCG/ML LIQD, Inject 3,000 mcg as directed every 30 (thirty) days., Disp: , Rfl:    EPINEPHrine 0.3 mg/0.3 mL IJ SOAJ injection, Inject 0.3 mg into the muscle as needed for anaphylaxis., Disp: , Rfl:    gabapentin (NEURONTIN) 100 MG capsule, Take 1 capsule (100 mg total) by mouth at bedtime., Disp: 30 capsule, Rfl: 0   isosorbide mononitrate (IMDUR) 30 MG 24 hr tablet, Take 0.5 tablets (15 mg total) by mouth daily., Disp: 45 tablet, Rfl: 1   levETIRAcetam (KEPPRA) 750 MG tablet, Take 1 tablet (750 mg total) by mouth 2 (two) times daily., Disp: 180 tablet, Rfl: 3   metoprolol succinate (TOPROL-XL) 100 MG 24 hr tablet, TAKE 1 TABLET(100 MG) BY MOUTH DAILY WITH OR IMMEDIATELY FOLLOWING A MEAL, Disp: 90 tablet, Rfl: 3   nicotine (NICODERM CQ - DOSED IN MG/24 HOURS) 14 mg/24hr patch, Place 1 patch (14 mg total) onto the skin daily., Disp: 30 patch, Rfl: 0   nitroGLYCERIN (NITROSTAT) 0.4 MG SL tablet, Place 1 tablet (0.4 mg total) under the tongue every 5 (five) minutes as needed., Disp: 25 tablet, Rfl: 2   sodium chloride (OCEAN) 0.65 % nasal spray, Place 1-2 sprays into the nose as needed for congestion., Disp: , Rfl:     ticagrelor (BRILINTA) 90 MG TABS tablet, TAKE 1 TABLET BY MOUTH TWICE DAILY, Disp: 180 tablet, Rfl: 1   Tiotropium Bromide-Olodaterol (STIOLTO RESPIMAT) 2.5-2.5 MCG/ACT AERS, Inhale 2 puffs into the lungs daily., Disp: 4 g, Rfl: 5   ZYRTEC ALLERGY 10 MG tablet, Take 10 mg by mouth at bedtime as needed for allergies., Disp: , Rfl:   Past Medical History: Past Medical History:  Diagnosis Date   Anxiety and depression    Arthritis    Breast cancer (HCC) 02/13/2009   Cancer of right breast (HCC) 04/16/2012   Coronary artery disease    Depression    Depression 04/18/2012   Dysrhythmia    palpitations   Epilepsy (HCC)    last seizure 1990's   H/O hiatal hernia    Hyperlipidemia    Hypertension    Localization-related (focal) (partial) epilepsy and epileptic syndromes with complex partial seizures, without mention of intractable epilepsy 02/07/2013   Memory deficit 02/07/2013   Memory loss    Mitral valve prolapse 06/01/2010   echo- EF>55% mild tricupsid regurgitation. There is mild Pulmonary Hypertension.. Right  ventricular systolic pressure iselevated at 30-40 mmHg. LV systolic function is normal.   Osteopenia    PONV (postoperative nausea and vomiting)    SOB (shortness of breath) 10/18/2011   met-test   Spinal fusion failure    3    Tobacco Use: Social History   Tobacco Use  Smoking Status Some Days   Current packs/day: 1.00   Average packs/day: 1 pack/day for 43.0 years (43.0 ttl pk-yrs)   Types: Cigarettes  Smokeless Tobacco Never  Tobacco Comments   Smokes a 1/2 of a cigarette a week. Has talked to [facility]. Offered virtual cessation classes declined. 12/06/22 MWW RN    Labs: Review Flowsheet       Latest Ref Rng & Units 02/17/2017 06/14/2022 10/23/2022 10/26/2022  Labs for ITP Cardiac and Pulmonary Rehab  Cholestrol 0 - 200 mg/dL 440  347  425  956   LDL (calc) 0 - 99 mg/dL 87  387  564  332   HDL-C >40 mg/dL 70  66  64  70   Trlycerides <150 mg/dL 65  71  83   71   Hemoglobin A1c 4.8 - 5.6 % - - 5.5  5.6   TCO2 22 - 32 mmol/L - - 24  -    Details            Capillary Blood Glucose: No results found for: "GLUCAP"   Exercise Target Goals: Exercise Program Goal: Individual exercise prescription set using results from initial 6 min walk test and THRR while considering  patient's activity barriers and safety.   Exercise Prescription Goal: Initial exercise prescription builds to 30-45 minutes a day of aerobic activity, 2-3 days per week.  Home exercise guidelines will be given to patient during program as part of exercise prescription that the participant will acknowledge.  Activity Barriers & Risk Stratification:  Activity Barriers & Cardiac Risk Stratification - 12/06/22 1117       Activity Barriers & Cardiac Risk Stratification   Activity Barriers Neck/Spine Problems;Balance Concerns;History of Falls;Other (comment);Arthritis;Back Problems;Chest Pain/Angina;Shortness of Breath    Comments Bulging disc- neck pain; seizure disorder-controlled; no cartilage in right knee-pain; lumbar spine fusion-back surgery x3    Cardiac Risk Stratification High             6 Minute Walk:  6 Minute Walk     Row Name 12/06/22 1200         6 Minute Walk   Phase Initial     Distance 1129 feet     Walk Time 6 minutes     # of Rest Breaks 0     MPH 2.14     METS 3.08     RPE 10.5     Perceived Dyspnea  0     VO2 Peak 10.77     Symptoms Yes (comment)     Comments Right knee and ankle pain- chronic, 1/10 on pain scale.     Resting HR 72 bpm     Resting BP 118/80     Resting Oxygen Saturation  97 %     Exercise Oxygen Saturation  during 6 min walk 95 %     Max Ex. HR 86 bpm     Max Ex. BP 148/80     2 Minute Post BP 128/80              Oxygen Initial Assessment:   Oxygen Re-Evaluation:   Oxygen Discharge (Final Oxygen Re-Evaluation):   Initial Exercise Prescription:  Initial Exercise Prescription - 12/06/22 1100        Date of Initial Exercise RX and Referring Provider   Date 12/06/22    Referring Provider Lennette Bihari, MD    Expected Discharge Date 03/01/23      Recumbant Bike   Level 1    Watts  20    Minutes 15    METs 1.5      NuStep   Level 1    SPM 85    Minutes 15    METs 1.5      Prescription Details   Frequency (times per week) 2    Duration Progress to 30 minutes of continuous aerobic without signs/symptoms of physical distress      Intensity   THRR 40-80% of Max Heartrate 63-126    Ratings of Perceived Exertion 11-13    Perceived Dyspnea 0-4      Progression   Progression Continue to progress workloads to maintain intensity without signs/symptoms of physical distress.      Resistance Training   Training Prescription Yes    Weight 2 lbs    Reps 10-15             Perform Capillary Blood Glucose checks as needed.  Exercise Prescription Changes:   Exercise Prescription Changes     Row Name 12/14/22 1609             Response to Exercise   Blood Pressure (Admit) 110/70       Blood Pressure (Exercise) 126/68       Blood Pressure (Exit) 106/60       Heart Rate (Admit) 75 bpm       Heart Rate (Exercise) 107 bpm       Heart Rate (Exit) 74 bpm       Rating of Perceived Exertion (Exercise) 12       Perceived Dyspnea (Exercise) 0       Symptoms 0       Comments Pt first day in [facility]       Duration Progress to 30 minutes of  aerobic without signs/symptoms of physical distress       Intensity THRR unchanged         Progression   Progression Continue to progress workloads to maintain intensity without signs/symptoms of physical distress.       Average METs 1.8         Resistance Training   Training Prescription No         Recumbant Bike   Level 1       RPM 55       Watts 12       Minutes 15       METs 2         NuStep   Level 1       SPM 63       Minutes 15       METs 1.6                Exercise Comments:   Exercise Comments     Row  Name 12/14/22 1613 01/11/23 0855 01/25/23 1430       Exercise Comments Pt first day in the [facility] program. Pt tolerated exercise well with an average MET level of 1.8. Pt is learning her THRR, RPE and ExRx Patient out due to illness, will review education on her return. Patient absent sice 12/26/22 Patient absent, will review education when attendance is regular. Last session was on 01/18/23              Exercise Goals and Review:   Exercise Goals     Row Name 12/06/22 1118             Exercise Goals   Increase Physical Activity Yes  Intervention Provide advice, education, support and counseling about physical activity/exercise needs.;Develop an individualized exercise prescription for aerobic and resistive training based on initial evaluation findings, risk stratification, comorbidities and participant's personal goals.       Expected Outcomes Short Term: Attend rehab on a regular basis to increase amount of physical activity.;Long Term: Add in home exercise to make exercise part of routine and to increase amount of physical activity.;Long Term: Exercising regularly at least 3-5 days a week.       Increase Strength and Stamina Yes       Intervention Provide advice, education, support and counseling about physical activity/exercise needs.;Develop an individualized exercise prescription for aerobic and resistive training based on initial evaluation findings, risk stratification, comorbidities and participant's personal goals.       Expected Outcomes Short Term: Increase workloads from initial exercise prescription for resistance, speed, and METs.;Short Term: Perform resistance training exercises routinely during rehab and add in resistance training at home;Long Term: Improve cardiorespiratory fitness, muscular endurance and strength as measured by increased METs and functional capacity ( )       Able to understand and use rate of perceived exertion (RPE) scale Yes        Intervention Provide education and explanation on how to use RPE scale       Expected Outcomes Short Term: Able to use RPE daily in rehab to express subjective intensity level;Long Term:  Able to use RPE to guide intensity level when exercising independently       Knowledge and understanding of Target Heart Rate Range (THRR) Yes       Intervention Provide education and explanation of THRR including how the numbers were predicted and where they are located for reference       Expected Outcomes Short Term: Able to state/look up THRR;Long Term: Able to use THRR to govern intensity when exercising independently;Short Term: Able to use daily as guideline for intensity in rehab       Able to check pulse independently Yes       Intervention Provide education and demonstration on how to check pulse in carotid and radial arteries.;Review the importance of being able to check your own pulse for safety during independent exercise       Expected Outcomes Short Term: Able to explain why pulse checking is important during independent exercise;Long Term: Able to check pulse independently and accurately       Understanding of Exercise Prescription Yes       Intervention Provide education, explanation, and written materials on patient's individual exercise prescription       Expected Outcomes Short Term: Able to explain program exercise prescription;Long Term: Able to explain home exercise prescription to exercise independently                Exercise Goals Re-Evaluation :  Exercise Goals Re-Evaluation     Row Name 12/14/22 1611             Exercise Goal Re-Evaluation   Exercise Goals Review Increase Physical Activity;Understanding of Exercise Prescription;Increase Strength and Stamina;Knowledge and understanding of Target Heart Rate Range (THRR);Able to understand and use rate of perceived exertion (RPE) scale       Comments Pt first day in the [facility] program. Pt tolerated exercise well with an  average MET level of 1.8. Pt is learning her THRR, RPE and ExRx       Expected Outcomes Will continue to monitor pt and progress workloads as tolerated without sign or symptom  Discharge Exercise Prescription (Final Exercise Prescription Changes):  Exercise Prescription Changes - 12/14/22 1609       Response to Exercise   Blood Pressure (Admit) 110/70    Blood Pressure (Exercise) 126/68    Blood Pressure (Exit) 106/60    Heart Rate (Admit) 75 bpm    Heart Rate (Exercise) 107 bpm    Heart Rate (Exit) 74 bpm    Rating of Perceived Exertion (Exercise) 12    Perceived Dyspnea (Exercise) 0    Symptoms 0    Comments Pt first day in [facility]    Duration Progress to 30 minutes of  aerobic without signs/symptoms of physical distress    Intensity THRR unchanged      Progression   Progression Continue to progress workloads to maintain intensity without signs/symptoms of physical distress.    Average METs 1.8      Resistance Training   Training Prescription No      Recumbant Bike   Level 1    RPM 55    Watts 12    Minutes 15    METs 2      NuStep   Level 1    SPM 63    Minutes 15    METs 1.6             Nutrition:  Target Goals: Understanding of nutrition guidelines, daily intake of sodium 1500mg , cholesterol 200mg , calories 30% from fat and 7% or less from saturated fats, daily to have 5 or more servings of fruits and vegetables.  Biometrics:  Pre Biometrics - 12/06/22 1013       Pre Biometrics   Waist Circumference 32 inches    Hip Circumference 40 inches    Waist to Hip Ratio 0.8 %    Triceps Skinfold 28 mm    % Body Fat 36.2 %    Grip Strength 22 kg    Flexibility --   Not performed, lumbar spine fusion.   Single Leg Stand 5.75 seconds              Nutrition Therapy Plan and Nutrition Goals:  Nutrition Therapy & Goals - 01/13/23 1417       Nutrition Therapy   Diet Heart Healthy Diet    Drug/Food Interactions  Statins/Certain Fruits      Personal Nutrition Goals   Nutrition Goal Patient to identify strategies for reducing cardiovascular risk by attending the [facility] education and nutrition series weekly.   goal in progress.   Personal Goal #2 Patient to improve diet quality by using the plate method as a guide for meal planning to include lean protein/plant protein, fruits, vegetables, whole grains, nonfat dairy as part of a well-balanced diet   goal in progress.   Personal Goal #3 Patient to reduce sodium to 1500mg  per day   goal in progress.   Comments Goals in progress. Lakelee has medical history of breast cancer, STEMI, HTN, tobacco abuse. Pollye continues to attend the [employer] and nutrition series regularly. She will begin repatha as she is unable to take statin medications per documenation on 01/10/23. Patient will benefit from participation in intensive cardiac rehab for nutrition, exercise, and lifestyle modification.      Intervention Plan   Intervention Prescribe, educate and counsel regarding individualized specific dietary modifications aiming towards targeted core components such as weight, hypertension, lipid management, diabetes, heart failure and other comorbidities.;Nutrition handout(s) given to patient.    Expected Outcomes Short Term Goal: Understand basic principles of dietary content,  such as calories, fat, sodium, cholesterol and nutrients.;Long Term Goal: Adherence to prescribed nutrition plan.             Nutrition Assessments:  Nutrition Assessments - 12/14/22 1533       Rate Your Plate Scores   Pre Score 77            MEDIFICTS Score Key: >=70 Need to make dietary changes  40-70 Heart Healthy Diet <= 40 Therapeutic Level Cholesterol Diet   Flowsheet Row INTENSIVE CARDIAC REHAB from 12/14/2022 in [facility] for Heart, Vascular, & Lung Health  Picture Your Plate Total Score on Admission 77      Picture Your Plate  Scores: <16 Unhealthy dietary pattern with much room for improvement. 41-50 Dietary pattern unlikely to meet recommendations for good health and room for improvement. 51-60 More healthful dietary pattern, with some room for improvement.  >60 Healthy dietary pattern, although there may be some specific behaviors that could be improved.    Nutrition Goals Re-Evaluation:  Nutrition Goals Re-Evaluation     Row Name 12/14/22 1552 01/13/23 1417           Goals   Current Weight 145 lb 1 oz (65.8 kg) 145 lb 11.6 oz (66.1 kg)      Comment LDL 113, A1c WNL, AST 41, ALT 42 no new labs; most recent labs  LDL 113, A1c WNL, AST 41, ALT 42      Expected Outcome Kalena has medical history of breast cancer, STEMI, HTN, tobacco abuse. Patient will benefit from participation in intensive cardiac rehab for nutrition, exercise, and lifestyle modification. Goals in progress. Pax has medical history of breast cancer, STEMI, HTN, tobacco abuse. Ray continues to attend the [employer] and nutrition series regularly. She will begin repatha as she is unable to take statin medications per documenation on 01/10/23. Patient will benefit from participation in intensive cardiac rehab for nutrition, exercise, and lifestyle modification.               Nutrition Goals Re-Evaluation:  Nutrition Goals Re-Evaluation     Row Name 12/14/22 1552 01/13/23 1417           Goals   Current Weight 145 lb 1 oz (65.8 kg) 145 lb 11.6 oz (66.1 kg)      Comment LDL 113, A1c WNL, AST 41, ALT 42 no new labs; most recent labs  LDL 113, A1c WNL, AST 41, ALT 42      Expected Outcome Oasis has medical history of breast cancer, STEMI, HTN, tobacco abuse. Patient will benefit from participation in intensive cardiac rehab for nutrition, exercise, and lifestyle modification. Goals in progress. Zynah has medical history of breast cancer, STEMI, HTN, tobacco abuse. Meika continues to attend the [employer] and  nutrition series regularly. She will begin repatha as she is unable to take statin medications per documenation on 01/10/23. Patient will benefit from participation in intensive cardiac rehab for nutrition, exercise, and lifestyle modification.               Nutrition Goals Discharge (Final Nutrition Goals Re-Evaluation):  Nutrition Goals Re-Evaluation - 01/13/23 1417       Goals   Current Weight 145 lb 11.6 oz (66.1 kg)    Comment no new labs; most recent labs  LDL 113, A1c WNL, AST 41, ALT 42    Expected Outcome Goals in progress. Lewellyn has medical history of breast cancer, STEMI, HTN, tobacco abuse. Madalie continues to attend the  [facility] education and nutrition series regularly. She will begin repatha as she is unable to take statin medications per documenation on 01/10/23. Patient will benefit from participation in intensive cardiac rehab for nutrition, exercise, and lifestyle modification.             Psychosocial: Target Goals: Acknowledge presence or absence of significant depression and/or stress, maximize coping skills, provide positive support system. Participant is able to verbalize types and ability to use techniques and skills needed for reducing stress and depression.  Initial Review & Psychosocial Screening:  Initial Psych Review & Screening - 12/06/22 1124       Initial Review   Current issues with History of Depression;Current Depression;Current Stress Concerns;Current Sleep Concerns    Source of Stress Concerns Chronic Illness;Unable to perform yard/household activities;Unable to participate in former interests or hobbies    Comments Christinea says she has had some difficulty sleeping due to taking new medication isosorbide. Daaiyah had stress related to recent MI stenitng, continued pain.      Family Dynamics   Good Support System? Yes   Navayah has her husband, son, siblings and church family for support     Barriers   Psychosocial barriers to participate in  program The patient should benefit from training in stress management and relaxation.      Screening Interventions   Interventions Encouraged to exercise    Expected Outcomes Long Term Goal: Stressors or current issues are controlled or eliminated.;Short Term goal: Utilizing psychosocial counselor, staff and physician to assist with identification of specific Stressors or current issues interfering with healing process. Setting desired goal for each stressor or current issue identified.;Long Term goal: The participant improves quality of Life and PHQ9 Scores as seen by post scores and/or verbalization of changes;Short Term goal: Identification and review with participant of any Quality of Life or Depression concerns found by scoring the questionnaire.             Quality of Life Scores:  Quality of Life - 12/06/22 1121       Quality of Life   Select Quality of Life      Quality of Life Scores   Health/Function Pre 25.6 %    Socioeconomic Pre 28 %    Psych/Spiritual Pre 27.07 %    Family Pre 30 %    GLOBAL Pre 27.04 %            Scores of 19 and below usually indicate a poorer quality of life in these areas.  A difference of  2-3 points is a clinically meaningful difference.  A difference of 2-3 points in the total score of the Quality of Life Index has been associated with significant improvement in overall quality of life, self-image, physical symptoms, and general health in studies assessing change in quality of life.  PHQ-9: Review Flowsheet       12/06/2022 03/18/2014  Depression screen PHQ 2/9  Decreased Interest 0 0  Down, Depressed, Hopeless 1 0  PHQ - 2 Score 1 0  Altered sleeping 1 -  Tired, decreased energy 2 -  Change in appetite 0 -  Feeling bad or failure about yourself  0 -  Trouble concentrating 1 -  Moving slowly or fidgety/restless 0 -  Suicidal thoughts 0 -  PHQ-9 Score 5 -  Difficult doing work/chores Not difficult at all -    Details            Interpretation of Total Score  Total Score Depression Severity:  1-4 = Minimal depression, 5-9 = Mild depression, 10-14 = Moderate depression, 15-19 = Moderately severe depression, 20-27 = Severe depression   Psychosocial Evaluation and Intervention:   Psychosocial Re-Evaluation:  Psychosocial Re-Evaluation     Row Name 12/15/22 1300 01/11/23 0957 02/08/23 0818 02/08/23 1641       Psychosocial Re-Evaluation   Current issues with History of Depression;Current Sleep Concerns;Current Depression;Current Stress Concerns History of Depression;Current Sleep Concerns;Current Depression;Current Stress Concerns History of Depression;Current Sleep Concerns;Current Depression;Current Stress Concerns History of Depression;Current Sleep Concerns;Current Depression;Current Stress Concerns    Comments Leeza did not voice any increased concerns or stressors on her first day of exercise. Unable to reassess Kalirose has been absent due to illness patient stopped participation and has been discharged as requested. patient returned to exercise and says she does not want to be discharged. Sister suddenly passed away. Emotional support provided    Expected Outcomes Piya will have decreased or controlled depression/ stressors upon completion of cardiac rehab [place] will have decreased or controlled depression/ stressors upon completion of cardiac rehab Jazlynne will have decreased or controlled depression/ stressors upon completion of cardiac rehab Sharonlee will have decreased or controlled depression/ stressors upon completion of cardiac rehab    Interventions Stress management education;Encouraged to attend Cardiac Rehabilitation for the exercise;Relaxation education Stress management education;Encouraged to attend Cardiac Rehabilitation for the exercise;Relaxation education Stress management education;Encouraged to attend Cardiac Rehabilitation for the exercise;Relaxation education Stress management  education;Encouraged to attend Cardiac Rehabilitation for the exercise;Relaxation education    Continue Psychosocial Services  Follow up required by staff Follow up required by staff Follow up required by staff Follow up required by staff      Initial Review   Source of Stress Concerns Occupation;Chronic Illness Occupation;Chronic Illness Occupation;Chronic Illness Occupation;Chronic Illness    Comments Will continue to monitor and offer support as needed Will continue to monitor and offer support as needed -- Will continue to monitor and offer support as needed             Psychosocial Discharge (Final Psychosocial Re-Evaluation):  Psychosocial Re-Evaluation - 02/08/23 1641       Psychosocial Re-Evaluation   Current issues with History of Depression;Current Sleep Concerns;Current Depression;Current Stress Concerns    Comments patient returned to exercise and says she does not want to be discharged. Sister suddenly passed away. Emotional support provided    Expected Outcomes Eshani will have decreased or controlled depression/ stressors upon completion of cardiac rehab    Interventions Stress management education;Encouraged to attend Cardiac Rehabilitation for the exercise;Relaxation education    Continue Psychosocial Services  Follow up required by staff      Initial Review   Source of Stress Concerns Occupation;Chronic Illness    Comments Will continue to monitor and offer support as needed             Vocational Rehabilitation: Provide vocational rehab assistance to qualifying candidates.   Vocational Rehab Evaluation & Intervention:  Vocational Rehab - 12/06/22 1211       Initial Vocational Rehab Evaluation & Intervention   Assessment shows need for Vocational Rehabilitation No   Jocellyn is disabled and does not need vocational rehab at this time.            Education: Education Goals: Education classes will be provided on a weekly basis, covering required  topics. Participant will state understanding/return demonstration of topics presented.    Education     Row Name 12/14/22 1500     Education  Cardiac Education Topics Pritikin   Customer service manager   Weekly Topic Fast and Healthy Breakfasts   Instruction Review Code 1- Verbalizes Understanding   Class Start Time 1400   Class Stop Time 1445   Class Time Calculation (min) 45 min    Row Name 12/21/22 1500     Education   Cardiac Education Topics Pritikin   Customer service manager   Weekly Topic Personalizing Your Pritikin Plate   Instruction Review Code 1- Verbalizes Understanding   Class Start Time 1400   Class Stop Time 1440   Class Time Calculation (min) 40 min    Row Name 12/26/22 1600     Education   Cardiac Education Topics Pritikin   Glass blower/designer Nutrition   Nutrition Workshop Label Reading   Instruction Review Code 1- Tax inspector   Class Start Time 1400   Class Stop Time 1445   Class Time Calculation (min) 45 min    Row Name 01/11/23 1600     Education   Cardiac Education Topics Pritikin   Environmental education officer - Meals in a Snap   Instruction Review Code 1- Verbalizes Understanding   Class Start Time 1355   Class Stop Time 1430   Class Time Calculation (min) 35 min    Row Name 01/18/23 1500     Education   Cardiac Education Topics Pritikin   Customer service manager   Weekly Topic One-Pot Wonders   Instruction Review Code 1- Verbalizes Understanding   Class Start Time 1355   Class Stop Time 1433   Class Time Calculation (min) 38 min    Row Name 02/08/23 1500     Education   Cardiac Education Topics Pritikin   Customer service manager   Weekly  Topic International Cuisine- Spotlight on the [employer] Zones   Instruction Review Code 1- Verbalizes Understanding   Class Start Time 1400   Class Stop Time 1441   Class Time Calculation (min) 41 min            Core Videos: Exercise    Move It!  Clinical staff conducted group or individual video education with verbal and written material and guidebook.  Patient learns the recommended Pritikin exercise program. Exercise with the goal of living a long, healthy life. Some of the health benefits of exercise include controlled diabetes, healthier blood pressure levels, improved cholesterol levels, improved heart and lung capacity, improved sleep, and better body composition. Everyone should speak with their doctor before starting or changing an exercise routine.  Biomechanical Limitations Clinical staff conducted group or individual video education with verbal and written material and guidebook.  Patient learns how biomechanical limitations can impact exercise and how we can mitigate and possibly overcome limitations to have an impactful and balanced exercise routine.  Body Composition Clinical staff conducted group or individual video education with verbal and written material and guidebook.  Patient learns that body composition (ratio of muscle mass to fat mass) is a key component to assessing overall fitness, rather than body weight alone. Increased fat mass, especially visceral belly fat, can put [place] at increased risk  for metabolic syndrome, type 2 diabetes, heart disease, and even death. It is recommended to combine diet and exercise (cardiovascular and resistance training) to improve your body composition. Seek guidance from your physician and exercise physiologist before implementing an exercise routine.  Exercise Action Plan Clinical staff conducted group or individual video education with verbal and written material and guidebook.  Patient learns the recommended strategies to achieve and  enjoy long-term exercise adherence, including variety, self-motivation, self-efficacy, and positive decision making. Benefits of exercise include fitness, good health, weight management, more energy, better sleep, less stress, and overall well-being.  Medical   Heart Disease Risk Reduction Clinical staff conducted group or individual video education with verbal and written material and guidebook.  Patient learns our heart is our most vital organ as it circulates oxygen, nutrients, white blood cells, and hormones throughout the entire body, and carries waste away. Data supports a plant-based eating plan like the Pritikin Program for its effectiveness in slowing progression of and reversing heart disease. The video provides a number of recommendations to address heart disease.   Metabolic Syndrome and Belly Fat  Clinical staff conducted group or individual video education with verbal and written material and guidebook.  Patient learns what metabolic syndrome is, how it leads to heart disease, and how one can reverse it and keep it from coming back. You have metabolic syndrome if you have 3 of the following 5 criteria: abdominal obesity, high blood pressure, high triglycerides, low HDL cholesterol, and high blood sugar.  Hypertension and Heart Disease Clinical staff conducted group or individual video education with verbal and written material and guidebook.  Patient learns that high blood pressure, or hypertension, is very common in the [place]. Hypertension is largely due to excessive salt intake, but other important risk factors include being overweight, physical inactivity, drinking too much alcohol, smoking, and not eating enough potassium from fruits and vegetables. High blood pressure is a leading risk factor for heart attack, stroke, congestive heart failure, dementia, kidney failure, and premature death. Long-term effects of excessive salt intake include stiffening of the arteries and  thickening of heart muscle and organ damage. Recommendations include ways to reduce hypertension and the risk of heart disease.  Diseases of Our Time - Focusing on Diabetes Clinical staff conducted group or individual video education with verbal and written material and guidebook.  Patient learns why the best way to stop diseases of our time is prevention, through food and other lifestyle changes. Medicine (such as prescription pills and surgeries) is often only a Band-Aid on the problem, not a long-term solution. Most common diseases of our time include obesity, type 2 diabetes, hypertension, heart disease, and cancer. The Pritikin Program is recommended and has been proven to help reduce, reverse, and/or prevent the damaging effects of metabolic syndrome.  Nutrition   Overview of the Pritikin Eating Plan  Clinical staff conducted group or individual video education with verbal and written material and guidebook.  Patient learns about the Pritikin Eating Plan for disease risk reduction. The Pritikin Eating Plan emphasizes a wide variety of unrefined, minimally-processed carbohydrates, like fruits, vegetables, whole grains, and legumes. Go, Caution, and Stop food choices are explained. Plant-based and lean animal proteins are emphasized. Rationale provided for low sodium intake for blood pressure control, low added sugars for blood sugar stabilization, and low added fats and oils for coronary artery disease risk reduction and weight management.  Calorie Density  Clinical staff conducted group or individual video education with verbal and written  material and guidebook.  Patient learns about calorie density and how it impacts the Pritikin Eating Plan. Knowing the characteristics of the food you choose will help you decide whether those foods will lead to weight gain or weight loss, and whether you want to consume more or less of them. Weight loss is usually a side effect of the Pritikin Eating Plan  because of its focus on low calorie-dense foods.  Label Reading  Clinical staff conducted group or individual video education with verbal and written material and guidebook.  Patient learns about the Pritikin recommended label reading guidelines and corresponding recommendations regarding calorie density, added sugars, sodium content, and whole grains.  Dining Out - Part 1  Clinical staff conducted group or individual video education with verbal and written material and guidebook.  Patient learns that restaurant meals can be sabotaging because they can be so high in calories, fat, sodium, and/or sugar. Patient learns recommended strategies on how to positively address this and avoid unhealthy pitfalls.  Facts on Fats  Clinical staff conducted group or individual video education with verbal and written material and guidebook.  Patient learns that lifestyle modifications can be just as effective, if not more so, as many medications for lowering your risk of heart disease. A Pritikin lifestyle can help to reduce your risk of inflammation and atherosclerosis (cholesterol build-up, or plaque, in the artery walls). Lifestyle interventions such as dietary choices and physical activity address the cause of atherosclerosis. A review of the types of fats and their impact on blood cholesterol levels, along with dietary recommendations to reduce fat intake is also included.  Nutrition Action Plan  Clinical staff conducted group or individual video education with verbal and written material and guidebook.  Patient learns how to incorporate Pritikin recommendations into their lifestyle. Recommendations include planning and keeping personal health goals in mind as an important part of their success.  Healthy Mind-Set    Healthy Minds, Bodies, Hearts  Clinical staff conducted group or individual video education with verbal and written material and guidebook.  Patient learns how to identify when they are  stressed. Video will discuss the impact of that stress, as well as the many benefits of stress management. Patient will also be introduced to stress management techniques. The way we think, act, and feel has an impact on our hearts.  How Our Thoughts Can Heal Our Hearts  Clinical staff conducted group or individual video education with verbal and written material and guidebook.  Patient learns that negative thoughts can cause depression and anxiety. This can result in negative lifestyle behavior and serious health problems. Cognitive behavioral therapy is an effective method to help control our thoughts in order to change and improve our emotional outlook.  Additional Videos:  Exercise    Improving Performance  Clinical staff conducted group or individual video education with verbal and written material and guidebook.  Patient learns to use a non-linear approach by alternating intensity levels and lengths of time spent exercising to help burn more calories and lose more body fat. Cardiovascular exercise helps improve heart health, metabolism, hormonal balance, blood sugar control, and recovery from fatigue. Resistance training improves strength, endurance, balance, coordination, reaction time, metabolism, and muscle mass. Flexibility exercise improves circulation, posture, and balance. Seek guidance from your physician and exercise physiologist before implementing an exercise routine and learn your capabilities and proper form for all exercise.  Introduction to Yoga  Clinical staff conducted group or individual video education with verbal and written material and guidebook.  Patient learns about yoga, a discipline of the coming together of mind, breath, and body. The benefits of yoga include improved flexibility, improved range of motion, better posture and core strength, increased lung function, weight loss, and positive self-image. Yoga's heart health benefits include lowered blood pressure,  healthier heart rate, decreased cholesterol and triglyceride levels, improved immune function, and reduced stress. Seek guidance from your physician and exercise physiologist before implementing an exercise routine and learn your capabilities and proper form for all exercise.  Medical   Aging: Enhancing Your Quality of Life  Clinical staff conducted group or individual video education with verbal and written material and guidebook.  Patient learns key strategies and recommendations to stay in good physical health and enhance quality of life, such as prevention strategies, having an advocate, securing a Health Care Proxy and Power of Attorney, and keeping a list of medications and system for tracking them. It also discusses how to avoid risk for bone loss.  Biology of Weight Control  Clinical staff conducted group or individual video education with verbal and written material and guidebook.  Patient learns that weight gain occurs because we consume more calories than we burn (eating more, moving less). Even if your body weight is normal, you may have higher ratios of fat compared to muscle mass. Too much body fat puts you at increased risk for cardiovascular disease, heart attack, stroke, type 2 diabetes, and obesity-related cancers. In addition to exercise, following the Pritikin Eating Plan can help reduce your risk.  Decoding Lab Results  Clinical staff conducted group or individual video education with verbal and written material and guidebook.  Patient learns that lab test reflects one measurement whose values change over time and are influenced by many factors, including medication, stress, sleep, exercise, food, hydration, pre-existing medical conditions, and more. It is recommended to use the knowledge from this video to become more involved with your lab results and evaluate your numbers to speak with your doctor.   Diseases of Our Time - Overview  Clinical staff conducted group or  individual video education with verbal and written material and guidebook.  Patient learns that according to the CDC, 50% to 70% of chronic diseases (such as obesity, type 2 diabetes, elevated lipids, hypertension, and heart disease) are avoidable through lifestyle improvements including healthier food choices, listening to satiety cues, and increased physical activity.  Sleep Disorders Clinical staff conducted group or individual video education with verbal and written material and guidebook.  Patient learns how good quality and duration of sleep are important to overall health and well-being. Patient also learns about sleep disorders and how they impact health along with recommendations to address them, including discussing with a physician.  Nutrition  Dining Out - Part 2 Clinical staff conducted group or individual video education with verbal and written material and guidebook.  Patient learns how to plan ahead and communicate in order to maximize their dining experience in a healthy and nutritious manner. Included are recommended food choices based on the type of restaurant the patient is visiting.   Fueling a Banker conducted group or individual video education with verbal and written material and guidebook.  There is a strong connection between our food choices and our health. Diseases like obesity and type 2 diabetes are very prevalent and are in large-part due to lifestyle choices. The Pritikin Eating Plan provides plenty of food and hunger-curbing satisfaction. It is easy to follow, affordable, and helps reduce health risks.  Menu  Workshop  Engineer, materials conducted group or individual video education with verbal and written material and guidebook.  Patient learns that restaurant meals can sabotage health goals because they are often packed with calories, fat, sodium, and sugar. Recommendations include strategies to plan ahead and to communicate with the manager,  chef, or server to help order a healthier meal.  Planning Your Eating Strategy  Clinical staff conducted group or individual video education with verbal and written material and guidebook.  Patient learns about the Pritikin Eating Plan and its benefit of reducing the risk of disease. The Pritikin Eating Plan does not focus on calories. Instead, it emphasizes high-quality, nutrient-rich foods. By knowing the characteristics of the foods, we choose, we can determine their calorie density and make informed decisions.  Targeting Your Nutrition Priorities  Clinical staff conducted group or individual video education with verbal and written material and guidebook.  Patient learns that lifestyle habits have a tremendous impact on disease risk and progression. This video provides eating and physical activity recommendations based on your personal health goals, such as reducing LDL cholesterol, losing weight, preventing or controlling type 2 diabetes, and reducing high blood pressure.  Vitamins and Minerals  Clinical staff conducted group or individual video education with verbal and written material and guidebook.  Patient learns different ways to obtain key vitamins and minerals, including through a recommended healthy diet. It is important to discuss all supplements you take with your doctor.   Healthy Mind-Set    Smoking Cessation  Clinical staff conducted group or individual video education with verbal and written material and guidebook.  Patient learns that cigarette smoking and tobacco addiction pose a serious health risk which affects millions of people. Stopping smoking will significantly reduce the risk of heart disease, lung disease, and many forms of cancer. Recommended strategies for quitting are covered, including working with your doctor to develop a successful plan.  Culinary   Becoming a Set designer conducted group or individual video education with verbal and written  material and guidebook.  Patient learns that cooking at home can be healthy, cost-effective, quick, and puts them in control. Keys to cooking healthy recipes will include looking at your recipe, assessing your equipment needs, planning ahead, making it simple, choosing cost-effective seasonal ingredients, and limiting the use of added fats, salts, and sugars.  Cooking - Breakfast and Snacks  Clinical staff conducted group or individual video education with verbal and written material and guidebook.  Patient learns how important breakfast is to satiety and nutrition through the entire day. Recommendations include key foods to eat during breakfast to help stabilize blood sugar levels and to prevent overeating at meals later in the day. Planning ahead is also a key component.  Cooking - Educational psychologist conducted group or individual video education with verbal and written material and guidebook.  Patient learns eating strategies to improve overall health, including an approach to cook more at home. Recommendations include thinking of animal protein as a side on your plate rather than center stage and focusing instead on lower calorie dense options like vegetables, fruits, whole grains, and plant-based proteins, such as beans. Making sauces in large quantities to freeze for later and leaving the skin on your vegetables are also recommended to maximize your experience.  Cooking - Healthy Salads and Dressing Clinical staff conducted group or individual video education with verbal and written material and guidebook.  Patient learns that vegetables, fruits, whole grains, and legumes are  the foundations of the Pritikin Eating Plan. Recommendations include how to incorporate each of these in flavorful and healthy salads, and how to create homemade salad dressings. Proper handling of ingredients is also covered. Cooking - Soups and [employer] - Soups and Desserts Clinical staff conducted  group or individual video education with verbal and written material and guidebook.  Patient learns that Pritikin soups and desserts make for easy, nutritious, and delicious snacks and meal components that are low in sodium, fat, sugar, and calorie density, while high in vitamins, minerals, and filling fiber. Recommendations include simple and healthy ideas for soups and desserts.   Overview     The Pritikin Solution Program Overview Clinical staff conducted group or individual video education with verbal and written material and guidebook.  Patient learns that the results of the Pritikin Program have been documented in more than 100 articles published in peer-reviewed journals, and the benefits include reducing risk factors for (and, in some cases, even reversing) high cholesterol, high blood pressure, type 2 diabetes, obesity, and more! An overview of the three key pillars of the Pritikin Program will be covered: eating well, doing regular exercise, and having a healthy mind-set.  WORKSHOPS  Exercise: Exercise Basics: Building Your Action Plan Clinical staff led group instruction and group discussion with PowerPoint presentation and patient guidebook. To enhance the learning environment the use of posters, models and videos may be added. At the conclusion of this workshop, patients will comprehend the difference between physical activity and exercise, as well as the benefits of incorporating both, into their routine. Patients will understand the FITT (Frequency, Intensity, Time, and Type) principle and how to use it to build an exercise action plan. In addition, safety concerns and other considerations for exercise and cardiac rehab will be addressed by the presenter. The purpose of this lesson is to promote a comprehensive and effective weekly exercise routine in order to improve patients' overall level of fitness.   Managing Heart Disease: Your Path to a Healthier Heart Clinical staff led  group instruction and group discussion with PowerPoint presentation and patient guidebook. To enhance the learning environment the use of posters, models and videos may be added.At the conclusion of this workshop, patients will understand the anatomy and physiology of the heart. Additionally, they will understand how Pritikin's three pillars impact the risk factors, the progression, and the management of heart disease.  The purpose of this lesson is to provide a high-level overview of the heart, heart disease, and how the Pritikin lifestyle positively impacts risk factors.  Exercise Biomechanics Clinical staff led group instruction and group discussion with PowerPoint presentation and patient guidebook. To enhance the learning environment the use of posters, models and videos may be added. Patients will learn how the structural parts of their bodies function and how these functions impact their daily activities, movement, and exercise. Patients will learn how to promote a neutral spine, learn how to manage pain, and identify ways to improve their physical movement in order to promote healthy living. The purpose of this lesson is to expose patients to common physical limitations that impact physical activity. Participants will learn practical ways to adapt and manage aches and pains, and to minimize their effect on regular exercise. Patients will learn how to maintain good posture while sitting, walking, and lifting.  Balance Training and Fall Prevention  Clinical staff led group instruction and group discussion with PowerPoint presentation and patient guidebook. To enhance the learning environment the use of posters, models  and videos may be added. At the conclusion of this workshop, patients will understand the importance of their sensorimotor skills (vision, proprioception, and the vestibular system) in maintaining their ability to balance as they age. Patients will apply a variety of balancing  exercises that are appropriate for their current level of function. Patients will understand the common causes for poor balance, possible solutions to these problems, and ways to modify their physical environment in order to minimize their fall risk. The purpose of this lesson is to teach patients about the importance of maintaining balance as they age and ways to minimize their risk of falling.  WORKSHOPS   Nutrition:  Fueling a Ship broker led group instruction and group discussion with PowerPoint presentation and patient guidebook. To enhance the learning environment the use of posters, models and videos may be added. Patients will review the foundational principles of the Pritikin Eating Plan and understand what constitutes a serving size in each of the food groups. Patients will also learn Pritikin-friendly foods that are better choices when away from home and review make-ahead meal and snack options. Calorie density will be reviewed and applied to three nutrition priorities: weight maintenance, weight loss, and weight gain. The purpose of this lesson is to reinforce (in a group setting) the key concepts around what patients are recommended to eat and how to apply these guidelines when away from home by planning and selecting Pritikin-friendly options. Patients will understand how calorie density may be adjusted for different weight management goals.  Mindful Eating  Clinical staff led group instruction and group discussion with PowerPoint presentation and patient guidebook. To enhance the learning environment the use of posters, models and videos may be added. Patients will briefly review the concepts of the Pritikin Eating Plan and the importance of low-calorie dense foods. The concept of mindful eating will be introduced as well as the importance of paying attention to internal hunger signals. Triggers for non-hunger eating and techniques for dealing with triggers will be explored.  The purpose of this lesson is to provide patients with the opportunity to review the basic principles of the Pritikin Eating Plan, discuss the value of eating mindfully and how to measure internal cues of hunger and fullness using the Hunger Scale. Patients will also discuss reasons for non-hunger eating and learn strategies to use for controlling emotional eating.  Targeting Your Nutrition Priorities Clinical staff led group instruction and group discussion with PowerPoint presentation and patient guidebook. To enhance the learning environment the use of posters, models and videos may be added. Patients will learn how to determine their genetic susceptibility to disease by reviewing their family history. Patients will gain insight into the importance of diet as part of an overall healthy lifestyle in mitigating the impact of genetics and other environmental insults. The purpose of this lesson is to provide patients with the opportunity to assess their personal nutrition priorities by looking at their family history, their own health history and current risk factors. Patients will also be able to discuss ways of prioritizing and modifying the Pritikin Eating Plan for their highest risk areas  Menu  Clinical staff led group instruction and group discussion with PowerPoint presentation and patient guidebook. To enhance the learning environment the use of posters, models and videos may be added. Using menus brought in from [employer], or printed from [employer], patients will apply the Pritikin dining out guidelines that were presented in the [employer] video. Patients will also be  able to practice these guidelines in a variety of provided scenarios. The purpose of this lesson is to provide patients with the opportunity to practice hands-on learning of the Pritikin Dining Out guidelines with actual menus and practice scenarios.  Label Reading Clinical staff led group instruction  and group discussion with PowerPoint presentation and patient guidebook. To enhance the learning environment the use of posters, models and videos may be added. Patients will review and discuss the Pritikin label reading guidelines presented in [employer]in's Label Reading Educational series video. Using fool labels brought in from local grocery stores and markets, patients will apply the label reading guidelines and determine if the packaged food meet the Pritikin guidelines. The purpose of this lesson is to provide patients with the opportunity to review, discuss, and practice hands-on learning of the Pritikin Label Reading guidelines with actual packaged food labels. Cooking School  P[employer]in's [employer] are designed to teach patients ways to prepare quick, simple, and affordable recipes at home. The importance of nutrition's role in chronic disease risk reduction is reflected in its emphasis in the overall Pritikin program. By learning how to prepare essential core Pritikin Eating Plan recipes, patients will increase control over what they eat; be able to customize the flavor of foods without the use of added salt, sugar, or fat; and improve the quality of the food they consume. By learning a set of core recipes which are easily assembled, quickly prepared, and affordable, patients are more likely to prepare more healthy foods at home. These workshops focus on convenient breakfasts, simple entres, side dishes, and desserts which can be prepared with minimal effort and are consistent with nutrition recommendations for cardiovascular risk reduction. Cooking [employer] are taught by a Armed forces logistics/support/administrative officer (RD) who has been trained by the [employer]. The chef or RD has a clear understanding of the importance of minimizing - if not completely eliminating - added fat, sugar, and sodium in recipes. Throughout the series of Cooking School Workshop sessions, patients will learn  about healthy ingredients and efficient methods of cooking to build confidence in their capability to prepare    Cooking School weekly topics:  Adding Flavor- Sodium-Free  Fast and Healthy Breakfasts  Powerhouse Plant-Based Proteins  Satisfying Salads and Dressings  Simple Sides and Sauces  International Cuisine-Spotlight on the [employer] Zones  Delicious Desserts  Savory Soups  [employer] - Meals in a Astronomer Appetizers and Snacks  Comforting Weekend Breakfasts  One-Pot Wonders   Fast Evening Meals  Landscape architect Your Pritikin Plate  WORKSHOPS   Healthy Mindset (Psychosocial):  Focused Goals, Sustainable Changes Clinical staff led group instruction and group discussion with PowerPoint presentation and patient guidebook. To enhance the learning environment the use of posters, models and videos may be added. Patients will be able to apply effective goal setting strategies to establish at least one personal goal, and then take consistent, meaningful action toward that goal. They will learn to identify common barriers to achieving personal goals and develop strategies to overcome them. Patients will also gain an understanding of how our mind-set can impact our ability to achieve goals and the importance of cultivating a positive and growth-oriented mind-set. The purpose of this lesson is to provide patients with a deeper understanding of how to set and achieve personal goals, as well as the tools and strategies needed to overcome common obstacles which may arise along the way.  From Head to Heart: The Power of  a Healthy Outlook  Clinical staff led group instruction and group discussion with PowerPoint presentation and patient guidebook. To enhance the learning environment the use of posters, models and videos may be added. Patients will be able to recognize and describe the impact of emotions and mood on physical health. They will discover the importance of  self-care and explore self-care practices which may work for them. Patients will also learn how to utilize the 4 C's to cultivate a healthier outlook and better manage stress and challenges. The purpose of this lesson is to demonstrate to patients how a healthy outlook is an essential part of maintaining good health, especially as they continue their cardiac rehab journey.  Healthy Sleep for a Healthy Heart Clinical staff led group instruction and group discussion with PowerPoint presentation and patient guidebook. To enhance the learning environment the use of posters, models and videos may be added. At the conclusion of this workshop, patients will be able to demonstrate knowledge of the importance of sleep to overall health, well-being, and quality of life. They will understand the symptoms of, and treatments for, common sleep disorders. Patients will also be able to identify daytime and nighttime behaviors which impact sleep, and they will be able to apply these tools to help manage sleep-related challenges. The purpose of this lesson is to provide patients with a general overview of sleep and outline the importance of quality sleep. Patients will learn about a few of the most common sleep disorders. Patients will also be introduced to the concept of "sleep hygiene," and discover ways to self-manage certain sleeping problems through simple daily behavior changes. Finally, the workshop will motivate patients by clarifying the links between quality sleep and their goals of heart-healthy living.   Recognizing and Reducing Stress Clinical staff led group instruction and group discussion with PowerPoint presentation and patient guidebook. To enhance the learning environment the use of posters, models and videos may be added. At the conclusion of this workshop, patients will be able to understand the types of stress reactions, differentiate between acute and chronic stress, and recognize the impact that chronic  stress has on their health. They will also be able to apply different coping mechanisms, such as reframing negative self-talk. Patients will have the opportunity to practice a variety of stress management techniques, such as deep abdominal breathing, progressive muscle relaxation, and/or guided imagery.  The purpose of this lesson is to educate patients on the role of stress in their lives and to provide healthy techniques for coping with it.  Learning Barriers/Preferences:  Learning Barriers/Preferences - 12/06/22 1205       Learning Barriers/Preferences   Learning Barriers Exercise Concerns;Sight;Hearing   Wears glasses, hearing loss does not have a hearing aide at this time. fell 2 weeks ago has problems with balance due to back problems   Learning Preferences Individual Instruction;Pictoral;Skilled Demonstration;Verbal Instruction;Written Material;Audio             Education Topics:  Knowledge Questionnaire Score:  Knowledge Questionnaire Score - 12/06/22 1126       Knowledge Questionnaire Score   Pre Score 26/28             Core Components/Risk Factors/Patient Goals at Admission:  Personal Goals and Risk Factors at Admission - 12/06/22 1118       Core Components/Risk Factors/Patient Goals on Admission   Tobacco Cessation Yes    Number of packs per day 1/2 a cigarette / week    Intervention Assist the participant in steps to  quit. Provide individualized education and counseling about committing to Tobacco Cessation, relapse prevention, and pharmacological support that can be provided by physician.;Education officer, environmental, assist with locating and accessing local/national Quit Smoking programs, and support quit date choice.    Expected Outcomes Short Term: Will demonstrate readiness to quit, by selecting a quit date.;Long Term: Complete abstinence from all tobacco products for at least 12 months from quit date.;Short Term: Will quit all tobacco product use, adhering to  prevention of relapse plan.    Hypertension Yes    Intervention Provide education on lifestyle modifcations including regular physical activity/exercise, weight management, moderate sodium restriction and increased consumption of fresh fruit, vegetables, and low fat dairy, alcohol moderation, and smoking cessation.;Monitor prescription use compliance.    Expected Outcomes Short Term: Continued assessment and intervention until BP is < 140/73mm HG in hypertensive participants. < 130/75mm HG in hypertensive participants with diabetes, heart failure or chronic kidney disease.;Long Term: Maintenance of blood pressure at goal levels.    Lipids Yes    Intervention Provide education and support for participant on nutrition & aerobic/resistive exercise along with prescribed medications to achieve LDL 70mg , HDL >40mg .    Expected Outcomes Short Term: Participant states understanding of desired cholesterol values and is compliant with medications prescribed. Participant is following exercise prescription and nutrition guidelines.;Long Term: Cholesterol controlled with medications as prescribed, with individualized exercise RX and with personalized nutrition plan. Value goals: LDL < 70mg , HDL > 40 mg.    Stress Yes    Intervention Offer individual and/or small group education and counseling on adjustment to heart disease, stress management and health-related lifestyle change. Teach and support self-help strategies.;Refer participants experiencing significant psychosocial distress to appropriate mental health specialists for further evaluation and treatment. When possible, include family members and significant others in education/counseling sessions.    Expected Outcomes Short Term: Participant demonstrates changes in health-related behavior, relaxation and other stress management skills, ability to obtain effective social support, and compliance with psychotropic medications if prescribed.;Long Term: Emotional  wellbeing is indicated by absence of clinically significant psychosocial distress or social isolation.    Personal Goal Other Yes    Personal Goal Be able to walk longer and more briskly. Gain confidence with exercise.    Intervention Develop an exercise action plan that Jolynne can do at home to help build stamina and aerobic fitness, so that she can walk longer and more briskly.    Expected Outcomes Laren will be more confident with her exercise routine, and she will be able to walk longer and faster as measured by self-report and 6-minute walk test.             Core Components/Risk Factors/Patient Goals Review:   Goals and Risk Factor Review     Row Name 12/15/22 1303 01/11/23 0957 02/08/23 0819 02/08/23 1644       Core Components/Risk Factors/Patient Goals Review   Personal Goals Review Weight Management/Obesity;Stress;Tobacco Cessation;Lipids;Hypertension Weight Management/Obesity;Stress;Tobacco Cessation;Lipids;Hypertension Weight Management/Obesity;Stress;Tobacco Cessation;Lipids;Hypertension Weight Management/Obesity;Stress;Tobacco Cessation;Lipids;Hypertension    Review Tumeka started cardiac rehab on 12/14/22 and did well with exercise. Vital signs were stable. Keymoni last session at  cardiac rehab was on 12/26/22. Nekoda has been absent with an URI. Daedra has stopped participation due to cost and has been discharged from the program Hilari returned to exercise at cardiac rehab today as her appointments were not cancelled. No complaints during exercise.    Expected Outcomes Lavisha will continue to participate in cardiac rehab for exercise, nutrition and lifestyle modifications Telena will  continue to participate in cardiac rehab for exercise, nutrition and lifestyle modifications Cyntia will continue to participate in cardiac rehab for exercise, nutrition and lifestyle modifications Trevia will continue to participate in cardiac rehab for exercise, nutrition and lifestyle  modifications             Core Components/Risk Factors/Patient Goals at Discharge (Final Review):   Goals and Risk Factor Review - 02/08/23 1644       Core Components/Risk Factors/Patient Goals Review   Personal Goals Review Weight Management/Obesity;Stress;Tobacco Cessation;Lipids;Hypertension    Review Oteria returned to exercise at cardiac rehab today as her appointments were not cancelled. No complaints during exercise.    Expected Outcomes Sakiyah will continue to participate in cardiac rehab for exercise, nutrition and lifestyle modifications             ITP Comments:  ITP Comments     Row Name 12/06/22 1013 12/14/22 1628 01/11/23 0956 02/08/23 1640     ITP Comments Medical Director- Dr. Armanda Magic, MD. Introduction to the [facility]in Education Program / Intensive Cardiac Rehab. Reviewed intial orientation folder. 30 Day ITP Review. Noura started cardiac rehab on 12/14/22 and did well with exercise, 30 Day ITP Review. Shaquaya has been absent due from cardiac rehab due to an URI 30 Day ITP Review. Neal retuned to exercise at cardiac rehab on 02/08/23             Comments: See ITP comments. Patient returned to exercise at cardiac rehab after being absent since October 2nd.Thayer Headings RN BSN

## 2023-02-09 ENCOUNTER — Inpatient Hospital Stay: Payer: Medicare Other | Admitting: Hematology

## 2023-02-09 VITALS — BP 149/82 | HR 67 | Temp 98.4°F | Resp 18 | Ht 62.0 in | Wt 148.7 lb

## 2023-02-09 DIAGNOSIS — E559 Vitamin D deficiency, unspecified: Secondary | ICD-10-CM | POA: Diagnosis not present

## 2023-02-09 DIAGNOSIS — C50111 Malignant neoplasm of central portion of right female breast: Secondary | ICD-10-CM

## 2023-02-09 DIAGNOSIS — E538 Deficiency of other specified B group vitamins: Secondary | ICD-10-CM | POA: Diagnosis not present

## 2023-02-09 DIAGNOSIS — D751 Secondary polycythemia: Secondary | ICD-10-CM | POA: Diagnosis not present

## 2023-02-09 DIAGNOSIS — Z87891 Personal history of nicotine dependence: Secondary | ICD-10-CM | POA: Diagnosis not present

## 2023-02-09 DIAGNOSIS — Z170 Estrogen receptor positive status [ER+]: Secondary | ICD-10-CM

## 2023-02-09 DIAGNOSIS — Z853 Personal history of malignant neoplasm of breast: Secondary | ICD-10-CM | POA: Diagnosis not present

## 2023-02-09 MED ORDER — MISC. DEVICES MISC: 6 refills | Status: AC

## 2023-02-09 NOTE — Patient Instructions (Addendum)
[facility] at [facility] Discharge Instructions   You were seen and examined today by Dr. Ellin Saba.  He reviewed the results of your lab work which are normal/stable.   Have your mammogram as scheduled in December.   We will see you back in 1 year. We will repeat lab work at that time.   Return as scheduled.    Thank you for choosing [facility] at [facility] to provide your oncology and hematology care.  To afford each patient quality time with our provider, please arrive at least 15 minutes before your scheduled appointment time.   If you have a lab appointment with the Cancer Center please come in thru the Main Entrance and check in at the main information desk.  You need to re-schedule your appointment should you arrive 10 or more minutes late.  We strive to give you quality time with our providers, and arriving late affects you and other patients whose appointments are after yours.  Also, if you no show three or more times for appointments you may be dismissed from the clinic at the providers discretion.     Again, thank you for choosing [facility].  Our hope is that these requests will decrease the amount of time that you wait before being seen by our physicians.       _____________________________________________________________  Should you have questions after your visit to [facility], please contact our office at [phone] and follow the prompts.  Our office hours are 8:00 a.m. and 4:30 p.m. Monday - Friday.  Please note that voicemails left after 4:00 p.m. may not be returned until the following business day.  We are closed weekends and major holidays.  You do have access to a nurse 24-7, just call the main number to the clinic [phone] and do not press any options, hold on the line and a nurse will answer the phone.    For prescription refill requests, have your pharmacy contact our office and  allow 72 hours.    Due to Covid, you will need to wear a mask upon entering the hospital. If you do not have a mask, a mask will be given to you at the Main Entrance upon arrival. For doctor visits, patients may have 1 support person age 66 or older with them. For treatment visits, patients can not have anyone with them due to social distancing guidelines and our immunocompromised population.

## 2023-02-09 NOTE — Progress Notes (Signed)
[facility] [address] [place] [address]    Clinic Day:  02/09/2023  Referring physician: Georgann Housekeeper, MD  Patient Care Team: Georgann Housekeeper, MD as PCP - General (Internal Medicine) Lennette Bihari, MD as PCP - Cardiology (Cardiology) Coletta Memos, MD as Consulting Physician (Neurosurgery)   ASSESSMENT & PLAN:   Assessment: 1.  Stage Ia right breast cancer: - Diagnosed in September 2010, right mastectomy and SLNB-1.7 cm IDC, 0/3 lymph nodes positive, ER/PR positive and HER2 negative. - 3 cycles of Taxotere and Cytoxan chemotherapy. - Unable to tolerate antiestrogen therapy   Plan: 1.  Stage Ia right breast cancer: - Physical exam: Right mastectomy site within normal limits with left breast has no palpable masses or adenopathy. - Diagnostic mammogram on the left side on 03/08/2022 was BI-RADS Category 1.  She will have screening mammogram done in December 2024.  RTC 1 year for follow-up.    2.  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  Hemoglobin today is 16.2 and hematocrit 49.4.  She quit smoking recently.  We will monitor at next visit.   3.  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 - She reports that she she quit smoking few months ago.   4.  Vitamin D deficiency: - She stopped taking vitamin D.  She is having them checked by her PMD.   5.  Low B12 levels: - Continue B12 supplements.  B12 level is 310.   Breast Cancer therapy associated bone loss: I have recommended calcium, Vitamin D and weight bearing exercises.  No orders of the defined types were placed in this encounter.     Alben Deeds Teague,acting as a Neurosurgeon for Doreatha Massed, MD.,have documented all relevant documentation on the behalf of Doreatha Massed, MD,as directed by  Doreatha Massed, MD  while in the presence of Doreatha Massed, MD.   I, Doreatha Massed MD, have reviewed the above documentation for accuracy and completeness, and I agree with the above.   Doreatha Massed, MD   10/24/20246:10 PM  CHIEF COMPLAINT:   Diagnosis: Malignant neoplasm of central portion of right breast in female and Vitamin D deficiency   Cancer Staging  Cancer of right breast [facility]) Staging form: Breast, AJCC 7th Edition - Clinical stage from 04/06/2015: Stage IA (T1c, N0, M0) - Signed by Ellouise Newer, PA-C on 04/06/2015    Prior Therapy: 1. Right simple mastectomy by Dr. Derrell Lolling on 02/13/09 2. Taxotere/Cytoxan x 3 cycles  3. Anastrozole  Current Therapy: Surveillance   HISTORY OF PRESENT ILLNESS:   Oncology History  Cancer of right breast (HCC)  01/13/2009 Initial Diagnosis   Needle core biopsy of 12 o'clock mass in right breast and axillary lymph node demonstrating invasive mammary carcinoma with negative lymph node.  ER 99%, PR 54%, Ki-67 15%, and Her2  negative.   02/13/2009 Definitive Surgery   Right simple mastectomy by Dr. Derrell Lolling demonstrating a 1.7 cm invasive ductal carcinoma, grade 1, no LVI, clear margins and 0/3 sentinel lymph nodes.    Oncotype testing   Intermediate-risk; was offered chemotherapy     Adjuvant Chemotherapy   Taxotere/Cytoxan x 3 cycles completed (unable to complete final cycle due to intolerance; patient weight was reportedly not dose-adjusted).     Anti-estrogen oral therapy   Started on Anastrozole; was only able to tolerate about 6 months  of therapy before it was discontinued d/t contractures of her hands requiring surgical intervention.  No subsequent anti-estrogen therapy was given.     Procedure   Per patient, she had genetic testing in the past; results were negative per her report.       INTERVAL HISTORY:   Chaneka is a 62 y.o. female presenting to clinic today for follow up of right breast cancer. She was last seen by me  on 02/10/22.  Since her last visit, she underwent left breast MM and [place] of left breast on 03/08/22 that found: no findings of malignancy in the left breast.   She was admitted to the hospital on 10/23/22 for STEMI and underwent a coronary/graft acute MI revascularization and left heart catheter with coronary angiography. She was given lasix 40 mg IV. She was told to continue ASA/Brilinta, statin, Toprol XL 50 mg OD. She admitted to the ED again on 10/26/22 for chest pain with associated nausea and vomiting. She was taken to the cath lab for re-look on PCI for her distal dominant RCA and was found to have unchanged coronary anatomy and 50-60% proximal RCA stenosis. CT A/P showed no acute abnormality. She was given Zofran ODT 4 mg at discharge.   Today, she states that she is doing well overall. Her appetite level is at 100%. Her energy level is at 75%. She saw her cardiologist 2 weeks before hospitalization for chest pain and shooting pain from the lower mid chest wall up to the muscles on the esophagus. She quit tobacco use from July 2024, but recently smoked a few cigarettes after her sister's passing. She has since stopped smoking.   She had an E. Coli infection in February 2024 and had recently started eating a healthier diet at that time with fresh fruits and vegetables. She also notes over 4 pound weight gain.   PAST MEDICAL HISTORY:   Past Medical History: Past Medical History:  Diagnosis Date   Anxiety and depression    Arthritis    Breast cancer (HCC) 02/13/2009   Cancer of right breast (HCC) 04/16/2012   Coronary artery disease    Depression    Depression 04/18/2012   Dysrhythmia    palpitations   Epilepsy (HCC)    last seizure 1990's   H/O hiatal hernia    Hyperlipidemia    Hypertension    Localization-related (focal) (partial) epilepsy and epileptic syndromes with complex partial seizures, without mention of intractable epilepsy 02/07/2013   Memory deficit 02/07/2013   Memory  loss    Mitral valve prolapse 06/01/2010   echo- EF>55% mild tricupsid regurgitation. There is mild Pulmonary Hypertension.. Right  ventricular systolic pressure iselevated at 30-40 mmHg. LV systolic function is normal.   Osteopenia    PONV (postoperative nausea and vomiting)    SOB (shortness of breath) 10/18/2011   met-test   Spinal fusion failure    3    Surgical History: Past Surgical History:  Procedure Laterality Date   BACK SURGERY     screws- lumbar   BREAST SURGERY  04/18/2008   mastectomy - RIGHT   BRONCHIAL WASHINGS  04/01/2021   Procedure: BRONCHIAL WASHINGS;  Surgeon: Josephine Igo, DO;  Location: [facility];  Service: Cardiopulmonary;;   CARDIAC CATHETERIZATION     COLONOSCOPY WITH PROPOFOL  03/13/2012   Procedure: COLONOSCOPY WITH PROPOFOL;  Surgeon: Charolett Bumpers, MD;  Location: [facility];  Service: Endoscopy;  Laterality: N/A;   CORONARY/GRAFT ACUTE MI REVASCULARIZATION N/A 10/23/2022   Procedure: Coronary/Graft  Acute MI Revascularization;  Surgeon: Kathleene Hazel, MD;  Location: [facility];  Service: Cardiovascular;  Laterality: N/A;   ESOPHAGOGASTRODUODENOSCOPY  03/13/2012   Procedure: ESOPHAGOGASTRODUODENOSCOPY (EGD);  Surgeon: Charolett Bumpers, MD;  Location: [facility];  Service: Endoscopy;  Laterality: N/A;  Reflux, Hiatal Hernia   HAND TENDON SURGERY  04/18/2010   tendons released in right hand    LEFT HEART CATH AND CORONARY ANGIOGRAPHY N/A 10/23/2022   Procedure: LEFT HEART CATH AND CORONARY ANGIOGRAPHY;  Surgeon: Kathleene Hazel, MD;  Location: [facility];  Service: Cardiovascular;  Laterality: N/A;   LEFT HEART CATH AND CORONARY ANGIOGRAPHY N/A 10/26/2022   Procedure: LEFT HEART CATH AND CORONARY ANGIOGRAPHY;  Surgeon: Orbie Pyo, MD;  Location: [facility];  Service: Cardiovascular;  Laterality: N/A;   MASTECTOMY, RADICAL  02/13/2009   SPINE SURGERY  1999, 2003, 2005   TONSILLECTOMY     VIDEO  BRONCHOSCOPY Bilateral 04/01/2021   Procedure: VIDEO BRONCHOSCOPY WITHOUT FLUORO;  Surgeon: Josephine Igo, DO;  Location: [facility];  Service: Cardiopulmonary;  Laterality: Bilateral;  Ultrathin bronchoscopy    Social History: Social History   Socioeconomic History   Marital status: Married    Spouse name: Not on file   Number of children: 1   Years of education: college 3   Highest education level: Not on file  Occupational History   Occupation: unemployed    Employer: UNEMPLOYED    Comment: 2003  Tobacco Use   Smoking status: Some Days    Current packs/day: 1.00    Average packs/day: 1 pack/day for 43.0 years (43.0 ttl pk-yrs)    Types: Cigarettes   Smokeless tobacco: Never   Tobacco comments:    Smokes a 1/2 of a cigarette a week. Has talked to [employer]. Offered virtual cessation classes declined. 12/06/22 [facility] RN  Substance and Sexual Activity   Alcohol use: No    Alcohol/week: 0.0 standard drinks of alcohol   Drug use: No   Sexual activity: Not on file  Other Topics Concern   Not on file  Social History Narrative   Patient lives at home with husband   Patient is right handed.   Patient does not drink caffeine.   Social Determinants of Health   Financial Resource Strain: Not on file  Food Insecurity: No Food Insecurity (10/27/2022)   Hunger Vital Sign    Worried About Running Out of Food in the Last Year: Never true    Ran Out of Food in the Last Year: Never true  Transportation Needs: No Transportation Needs (10/27/2022)   PRAPARE - Administrator, Civil Service (Medical): No    Lack of Transportation (Non-Medical): No  Physical Activity: Not on file  Stress: Not on file  Social Connections: Not on file  Intimate Partner Violence: Not At Risk (10/27/2022)   Humiliation, Afraid, Rape, and Kick questionnaire    Fear of Current or Ex-Partner: No    Emotionally Abused: No    Physically Abused: No    Sexually Abused: No    Family  History: Family History  Problem Relation Age of Onset   Arthritis Mother        rhuematoid   Heart disease Mother        Atrial fib   Heart disease Father    Heart disease Sister        Atrial fib    Current Medications:  Current Outpatient Medications:    aspirin EC  81 MG tablet, Take 1 tablet (81 mg total) by mouth daily. Swallow whole., Disp: 120 tablet, Rfl: 3   Cyanocobalamin (VITAMIN B-12) 3000 MCG/ML LIQD, Inject 3,000 mcg as directed every 30 (thirty) days., Disp: , Rfl:    EPINEPHrine 0.3 mg/0.3 mL IJ SOAJ injection, Inject 0.3 mg into the muscle as needed for anaphylaxis., Disp: , Rfl:    gabapentin (NEURONTIN) 100 MG capsule, Take 1 capsule (100 mg total) by mouth at bedtime., Disp: 30 capsule, Rfl: 0   isosorbide mononitrate (IMDUR) 30 MG 24 hr tablet, Take 0.5 tablets (15 mg total) by mouth daily., Disp: 45 tablet, Rfl: 1   levETIRAcetam (KEPPRA) 750 MG tablet, Take 1 tablet (750 mg total) by mouth 2 (two) times daily., Disp: 180 tablet, Rfl: 3   metoprolol succinate (TOPROL-XL) 100 MG 24 hr tablet, TAKE 1 TABLET(100 MG) BY MOUTH DAILY WITH OR IMMEDIATELY FOLLOWING A MEAL, Disp: 90 tablet, Rfl: 3   Misc. Devices MISC, Please supply with Prosthetic Mastectomy Bras, Disp: 1 each, Rfl: 6   nicotine (NICODERM CQ - DOSED IN MG/24 HOURS) 14 mg/24hr patch, Place 1 patch (14 mg total) onto the skin daily., Disp: 30 patch, Rfl: 0   nitroGLYCERIN (NITROSTAT) 0.4 MG SL tablet, Place 1 tablet (0.4 mg total) under the tongue every 5 (five) minutes as needed., Disp: 25 tablet, Rfl: 2   sodium chloride (OCEAN) 0.65 % nasal spray, Place 1-2 sprays into the nose as needed for congestion., Disp: , Rfl:    ticagrelor (BRILINTA) 90 MG TABS tablet, TAKE 1 TABLET BY MOUTH TWICE DAILY, Disp: 180 tablet, Rfl: 1   Tiotropium Bromide-Olodaterol (STIOLTO RESPIMAT) 2.5-2.5 MCG/ACT AERS, Inhale 2 puffs into the lungs daily., Disp: 4 g, Rfl: 5   ZYRTEC ALLERGY 10 MG tablet, Take 10 mg by mouth at bedtime  as needed for allergies., Disp: , Rfl:    Allergies: Allergies  Allergen Reactions   Carbamazepine Anaphylaxis   Lamictal [Lamotrigine] Anaphylaxis    SOB, Lamotrigine only   Omeprazole Anaphylaxis   Other     STATES HAS SHORTNESS OF BREATH WITH GENERIC DRUGS     ALSO RASH AND ITCHING   Pantoprazole Anaphylaxis   Prednisone Other (See Comments), Rash and Shortness Of Breath    Prescribing MD told patient he thinks dose (dose pack) was "just too high.", 03/09/11   STATES HAS TAKEN SMALLER DOSE AND STILL HAD RASH AND JITTERINESS, Prescribing MD told patient he thinks dose (dose pack) was "just too high.", 03/09/11   STATES HAS TAKEN SMALLER DOSE AND STILL HAD RASH AND JITTERINESS   Proton Pump Inhibitors Anaphylaxis   Pseudoephedrine Hcl Er Other (See Comments)    SEIZURES.   Valium Other (See Comments)    Hallucinations, spasticity, hyper-relfexive    Amlodipine Rash    Face swell   Oxycodone Nausea And Vomiting, Rash and Other (See Comments)   Ibuprofen Swelling   Percocet [Oxycodone-Acetaminophen] Nausea And Vomiting   Rosuvastatin Diarrhea   Statins     myalgia   T[employer] Er] Other (See Comments)    Made headache worse, dizziness, difficulty breathing, rash   Betadine [Povidone Iodine] Rash   Latex Dermatitis and Rash   Shellfish Allergy Swelling    REVIEW OF SYSTEMS:   Review of Systems  Constitutional:  Positive for fatigue. Negative for chills and fever.  HENT:   Negative for lump/mass, mouth sores, nosebleeds, sore throat and trouble swallowing.   Eyes:  Negative for eye problems.  Respiratory:  Negative  for cough and shortness of breath.   Cardiovascular:  Positive for chest pain. Negative for leg swelling and palpitations.  Gastrointestinal:  Negative for abdominal pain, constipation, diarrhea, nausea and vomiting.  Genitourinary:  Negative for bladder incontinence, difficulty urinating, dysuria, frequency, hematuria and nocturia.   Musculoskeletal:   Negative for arthralgias, back pain, flank pain, myalgias and neck pain.       +osteoarthritic pain, 6/10 severity  Skin:  Negative for itching and rash.  Neurological:  Negative for dizziness, headaches and numbness.       +tingling hands and feet  Hematological:  Does not bruise/bleed easily.  Psychiatric/Behavioral:  Positive for depression and sleep disturbance. Negative for suicidal ideas. The patient is nervous/anxious.   All other systems reviewed and are negative.    VITALS:   Blood pressure (!) 149/82, pulse 67, temperature 98.4 F (36.9 C), temperature source Oral, resp. rate 18, height 5\' 2"  (1.575 m), weight 148 lb 11.2 oz (67.4 kg), last menstrual period 11/03/1993, SpO2 97%.  Wt Readings from Last 3 Encounters:  02/09/23 148 lb 11.2 oz (67.4 kg)  01/11/23 144 lb 8 oz (65.5 kg)  12/06/22 145 lb 1 oz (65.8 kg)    Body mass index is 27.2 kg/m.  Performance status (ECOG): 1 - Symptomatic but completely ambulatory  PHYSICAL EXAM:   Physical Exam Vitals and nursing note reviewed. Exam conducted with a chaperone present.  Constitutional:      Appearance: Normal appearance.  Cardiovascular:     Rate and Rhythm: Normal rate and regular rhythm.     Pulses: Normal pulses.     Heart sounds: Normal heart sounds.  Pulmonary:     Effort: Pulmonary effort is normal.     Breath sounds: Normal breath sounds.  Abdominal:     Palpations: Abdomen is soft. There is no hepatomegaly, splenomegaly or mass.     Tenderness: There is no abdominal tenderness.  Musculoskeletal:     Right lower leg: No edema.     Left lower leg: No edema.  Lymphadenopathy:     Cervical: No cervical adenopathy.     Right cervical: No superficial, deep or posterior cervical adenopathy.    Left cervical: No superficial, deep or posterior cervical adenopathy.     Upper Body:     Right upper body: No supraclavicular or axillary adenopathy.     Left upper body: No supraclavicular or axillary adenopathy.   Neurological:     General: No focal deficit present.     Mental Status: She is alert and oriented to person, place, and time.  Psychiatric:        Mood and Affect: Mood normal.        Behavior: Behavior normal.   Breast Exam Chaperone: Chapman Moss, RN   LABS:      Latest Ref Rng & Units 02/02/2023    1:50 PM 10/27/2022    9:53 AM 10/26/2022    2:17 AM  CBC  WBC 4.0 - 10.5 K/uL 8.5  12.6  11.5   Hemoglobin 12.0 - 15.0 g/dL 16.1  09.6  04.5   Hematocrit 36.0 - 46.0 % 49.4  43.4  49.2   Platelets 150 - 400 K/uL 189  143  174       Latest Ref Rng & Units 02/02/2023    1:50 PM 10/28/2022    8:26 AM 10/27/2022    9:53 AM  CMP  Glucose 70 - 99 mg/dL 98  97  409   BUN 8 -  23 mg/dL 12  18  19    Creatinine 0.44 - 1.00 mg/dL 6.23  7.62  8.31   Sodium 135 - 145 mmol/L 141  136  137   Potassium 3.5 - 5.1 mmol/L 3.8  3.8  3.8   Chloride 98 - 111 mmol/L 106  100  99   CO2 22 - 32 mmol/L 26  24  25    Calcium 8.9 - 10.3 mg/dL 8.7  8.6  8.8   Total Protein 6.5 - 8.1 g/dL 6.9   6.7   Total Bilirubin 0.3 - 1.2 mg/dL 1.2   1.6   Alkaline Phos 38 - 126 U/L 89   66   AST 15 - 41 U/L 19   41   ALT 0 - 44 U/L 17   42      No results found for: "CEA1", "CEA" / No results found for: "CEA1", "CEA" No results found for: "PSA1" No results found for: "CAN199" No results found for: "CAN125"  Lab Results  Component Value Date   TOTALPROTELP 6.4 12/02/2020   ALBUMINELP 3.9 12/02/2020   A1GS 0.2 12/02/2020   A2GS 0.7 12/02/2020   BETS 0.8 12/02/2020   GAMS 0.9 12/02/2020   MSPIKE Not Observed 12/02/2020   SPEI Comment 12/02/2020   Lab Results  Component Value Date   FERRITIN 53 11/20/2018   FERRITIN 47 10/24/2017   Lab Results  Component Value Date   LDH 153 11/20/2018   LDH 141 10/24/2017   LDH 124 02/09/2009     STUDIES:   CT CHEST LUNG CA SCREEN LOW DOSE W/O CM  Result Date: 02/09/2023 CLINICAL DATA:  47 pack-year smoking history/current smoker EXAM: CT CHEST WITHOUT  CONTRAST LOW-DOSE FOR LUNG CANCER SCREENING TECHNIQUE: Multidetector CT imaging of the chest was performed following the standard protocol without IV contrast. RADIATION DOSE REDUCTION: This exam was performed according to the departmental dose-optimization program which includes automated exposure control, adjustment of the mA and/or kV according to patient size and/or use of iterative reconstruction technique. COMPARISON:  01/28/2022 diagnostic CT. Lung cancer screening CT 01/29/2021 FINDINGS: Cardiovascular: Aortic atherosclerosis. Mild cardiomegaly, without pericardial effusion. Right coronary artery calcification. Mediastinum/Nodes: No supraclavicular adenopathy. No axillary or subpectoral adenopathy. No mediastinal or hilar adenopathy, given limitations of unenhanced CT. Lungs/Pleura: No pleural fluid. Mild centrilobular emphysema. Chronic inferior lingular endobronchial narrowing including on 170/4. Resultant chronic lingular volume loss, similar. Persistent mild medial right middle lobe volume loss as well. Ill-defined centrilobular micronodularity is likely indicative of smoking related respiratory bronchiolitis. Bilateral pulmonary nodules of maximally 3.2 mm. Upper Abdomen: Normal imaged portions of the liver, spleen, stomach, pancreas, adrenal glands, kidneys. Musculoskeletal: Right mastectomy.  No acute osseous abnormality. IMPRESSION: 1. Lung-RADS 2, benign appearance or behavior. Continue annual screening with low-dose chest CT without contrast in 12 months. 2.  Aortic Atherosclerosis (ICD10-I70.0).  Emphysema (ICD10-J43.9). 3. Age advanced coronary artery atherosclerosis. Recommend assessment of coronary risk factors. Electronically Signed   By: Jeronimo Greaves M.D.   On: 02/09/2023 12:49

## 2023-02-10 ENCOUNTER — Other Ambulatory Visit: Payer: Self-pay | Admitting: Acute Care

## 2023-02-10 ENCOUNTER — Encounter (HOSPITAL_COMMUNITY): Payer: Medicare Other

## 2023-02-10 DIAGNOSIS — Z87891 Personal history of nicotine dependence: Secondary | ICD-10-CM

## 2023-02-10 DIAGNOSIS — F17210 Nicotine dependence, cigarettes, uncomplicated: Secondary | ICD-10-CM

## 2023-02-10 DIAGNOSIS — Z122 Encounter for screening for malignant neoplasm of respiratory organs: Secondary | ICD-10-CM

## 2023-02-13 ENCOUNTER — Encounter (HOSPITAL_COMMUNITY)
Admission: RE | Admit: 2023-02-13 | Discharge: 2023-02-13 | Disposition: A | Discharge status: Home / Self Care | Payer: Medicare Other | Source: Ambulatory Visit | Attending: Cardiovascular Disease

## 2023-02-13 DIAGNOSIS — I2111 ST elevation (STEMI) myocardial infarction involving right coronary artery: Secondary | ICD-10-CM | POA: Diagnosis not present

## 2023-02-13 DIAGNOSIS — Z955 Presence of coronary angioplasty implant and graft: Secondary | ICD-10-CM

## 2023-02-15 ENCOUNTER — Encounter (HOSPITAL_COMMUNITY): Payer: Medicare Other

## 2023-02-16 DIAGNOSIS — E538 Deficiency of other specified B group vitamins: Secondary | ICD-10-CM | POA: Diagnosis not present

## 2023-02-16 DIAGNOSIS — M549 Dorsalgia, unspecified: Secondary | ICD-10-CM | POA: Diagnosis not present

## 2023-02-16 DIAGNOSIS — M519 Unspecified thoracic, thoracolumbar and lumbosacral intervertebral disc disorder: Secondary | ICD-10-CM | POA: Diagnosis not present

## 2023-02-17 ENCOUNTER — Encounter (HOSPITAL_COMMUNITY): Payer: Medicare Other

## 2023-02-20 ENCOUNTER — Telehealth (HOSPITAL_COMMUNITY): Payer: Self-pay

## 2023-02-20 ENCOUNTER — Encounter (HOSPITAL_COMMUNITY): Payer: Medicare Other

## 2023-02-20 NOTE — Telephone Encounter (Signed)
Pt called and and expressed that she has pinched a nerve in her back. She did go to the doctor today and got a steroid injection. She stated " My doctor told me to not exercise this week." I did inform her I would take out all appointments for this week and we hope she feels better and can return on Monday! I have canceled all appointments for this week.

## 2023-02-22 ENCOUNTER — Encounter (HOSPITAL_COMMUNITY): Payer: Medicare Other

## 2023-02-23 ENCOUNTER — Telehealth (HOSPITAL_COMMUNITY): Payer: Self-pay | Admitting: *Deleted

## 2023-02-23 NOTE — Telephone Encounter (Signed)
Left message to call cardiac rehab.Shaynah Hund Walden Kinzi Frediani RN BSN  

## 2023-02-24 ENCOUNTER — Encounter (HOSPITAL_COMMUNITY): Payer: Medicare Other

## 2023-02-24 ENCOUNTER — Telehealth (HOSPITAL_COMMUNITY): Payer: Self-pay

## 2023-02-24 NOTE — Telephone Encounter (Signed)
Wendy Lopez and left a VM. She stated " I just left the doctors office. They are sending me for a MRI on my back and have suggested that I not participate in cardiac rehab for a couple weeks. Im suppose to graduate next week any ways so I guess Wendy Lopez can go ahead and release me." She is very thankful for everything we have done and enjoyed her time here.

## 2023-02-27 ENCOUNTER — Encounter (HOSPITAL_COMMUNITY): Payer: Medicare Other

## 2023-02-28 NOTE — Progress Notes (Incomplete)
Discharge Progress Report  Patient Details  Name: Wendy Lopez MRN: [id number] Date of Birth: 14-Aug-1960 Referring Provider:   Flowsheet Row INTENSIVE CARDIAC REHAB ORIENT from 12/06/2022 in [facility]  Referring Provider Lennette Bihari, MD        Number of Visits: 16  Reason for Discharge:  Early Exit:  Lack of attendance and back problems  Smoking History:  Social History   Tobacco Use  Smoking Status Some Days   Current packs/day: 1.00   Average packs/day: 1 pack/day for 43.0 years (43.0 ttl pk-yrs)   Types: Cigarettes  Smokeless Tobacco Never  Tobacco Comments   Smokes a 1/2 of a cigarette a week. Has talked to [facility]. Offered virtual cessation classes declined. 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  ADL [facility]:   Initial Exercise Prescription:  Initial Exercise Prescription - 12/06/22 1100       Date of Initial Exercise RX and Referring Provider   Date 12/06/22    Referring Provider Lennette Bihari, MD    Expected Discharge Date 03/01/23      Recumbant Bike   Level 1    Watts 20    Minutes 15    METs 1.5      NuStep   Level 1    SPM 85    Minutes 15    METs 1.5      Prescription Details   Frequency (times per week) 2    Duration Progress to 30 minutes of continuous aerobic without signs/symptoms of physical distress      Intensity   THRR 40-80% of Max Heartrate 63-126    Ratings of Perceived Exertion 11-13    Perceived Dyspnea 0-4      Progression   Progression Continue to progress workloads to maintain intensity without signs/symptoms of physical distress.      Resistance Training   Training Prescription Yes    Weight 2 lbs    Reps 10-15             Discharge Exercise Prescription (Final Exercise Prescription Changes):  Exercise Prescription Changes - 12/14/22 1609       Response to Exercise   Blood Pressure (Admit) 110/70    Blood  Pressure (Exercise) 126/68    Blood Pressure (Exit) 106/60    Heart Rate (Admit) 75 bpm    Heart Rate (Exercise) 107 bpm    Heart Rate (Exit) 74 bpm    Rating of Perceived Exertion (Exercise) 12    Perceived Dyspnea (Exercise) 0    Symptoms 0    Comments Pt first day in [facility]    Duration Progress to 30 minutes of  aerobic without signs/symptoms of physical distress    Intensity THRR unchanged      Progression   Progression Continue to progress workloads to maintain intensity without signs/symptoms of physical distress.    Average METs 1.8      Resistance Training   Training Prescription No      Recumbant Bike   Level 1    RPM 55    Watts 12    Minutes 15    METs 2      NuStep   Level 1    SPM 63    Minutes 15    METs 1.6             Functional Capacity:  6 Minute Walk  Row Name 12/06/22 1200         6 Minute Walk   Phase Initial     Distance 1129 feet     Walk Time 6 minutes     # of Rest Breaks 0     MPH 2.14     METS 3.08     RPE 10.5     Perceived Dyspnea  0     VO2 Peak 10.77     Symptoms Yes (comment)     Comments Right knee and ankle pain- chronic, 1/10 on pain scale.     Resting HR 72 bpm     Resting BP 118/80     Resting Oxygen Saturation  97 %     Exercise Oxygen Saturation  during 6 min walk 95 %     Max Ex. HR 86 bpm     Max Ex. BP 148/80     2 Minute Post BP 128/80              Psychological, QOL, Others - Outcomes: PHQ 2/9:    12/06/2022   11:22 AM 03/18/2014    1:21 PM  Depression screen PHQ 2/9  Decreased Interest 0 0  Down, Depressed, Hopeless 1 0  PHQ - 2 Score 1 0  Altered sleeping 1   Tired, decreased energy 2   Change in appetite 0   Feeling bad or failure about yourself  0   Trouble concentrating 1   Moving slowly or fidgety/restless 0   Suicidal thoughts 0   PHQ-9 Score 5   Difficult doing work/chores Not difficult at all     Quality of Life:  Quality of Life - 12/06/22 1121       Quality of  Life   Select Quality of Life      Quality of Life Scores   Health/Function Pre 25.6 %    Socioeconomic Pre 28 %    Psych/Spiritual Pre 27.07 %    Family Pre 30 %    GLOBAL Pre 27.04 %             Personal Goals: Goals established at orientation with interventions provided to work toward goal.  Personal Goals and Risk Factors at Admission - 12/06/22 1118       Core Components/Risk Factors/Patient Goals on Admission   Tobacco Cessation Yes    Number of packs per day 1/2 a cigarette / week    Intervention Assist the participant in steps to quit. Provide individualized education and counseling about committing to Tobacco Cessation, relapse prevention, and pharmacological support that can be provided by physician.;Education officer, environmental, assist with locating and accessing local/national Quit Smoking programs, and support quit date choice.    Expected Outcomes Short Term: Will demonstrate readiness to quit, by selecting a quit date.;Long Term: Complete abstinence from all tobacco products for at least 12 months from quit date.;Short Term: Will quit all tobacco product use, adhering to prevention of relapse plan.    Hypertension Yes    Intervention Provide education on lifestyle modifcations including regular physical activity/exercise, weight management, moderate sodium restriction and increased consumption of fresh fruit, vegetables, and low fat dairy, alcohol moderation, and smoking cessation.;Monitor prescription use compliance.    Expected Outcomes Short Term: Continued assessment and intervention until BP is < 140/95mm HG in hypertensive participants. < 130/74mm HG in hypertensive participants with diabetes, heart failure or chronic kidney disease.;Long Term: Maintenance of blood pressure at goal levels.    Lipids Yes  Intervention Provide education and support for participant on nutrition & aerobic/resistive exercise along with prescribed medications to achieve LDL 70mg , HDL  >40mg .    Expected Outcomes Short Term: Participant states understanding of desired cholesterol values and is compliant with medications prescribed. Participant is following exercise prescription and nutrition guidelines.;Long Term: Cholesterol controlled with medications as prescribed, with individualized exercise RX and with personalized nutrition plan. Value goals: LDL < 70mg , HDL > 40 mg.    Stress Yes    Intervention Offer individual and/or small group education and counseling on adjustment to heart disease, stress management and health-related lifestyle change. Teach and support self-help strategies.;Refer participants experiencing significant psychosocial distress to appropriate mental health specialists for further evaluation and treatment. When possible, include family members and significant others in education/counseling sessions.    Expected Outcomes Short Term: Participant demonstrates changes in health-related behavior, relaxation and other stress management skills, ability to obtain effective social support, and compliance with psychotropic medications if prescribed.;Long Term: Emotional wellbeing is indicated by absence of clinically significant psychosocial distress or social isolation.    Personal Goal Other Yes    Personal Goal Be able to walk longer and more briskly. Gain confidence with exercise.    Intervention Develop an exercise action plan that Wendy Lopez can do at home to help build stamina and aerobic fitness, so that she can walk longer and more briskly.    Expected Outcomes Wendy Lopez will be more confident with her exercise routine, and she will be able to walk longer and faster as measured by self-report and 6-minute walk test.              Personal Goals Discharge:  Goals and Risk Factor Review     Row Name 12/15/22 1303 01/11/23 0957 02/08/23 0819 02/08/23 1644       Core Components/Risk Factors/Patient Goals Review   Personal Goals Review Weight  Management/Obesity;Stress;Tobacco Cessation;Lipids;Hypertension Weight Management/Obesity;Stress;Tobacco Cessation;Lipids;Hypertension Weight Management/Obesity;Stress;Tobacco Cessation;Lipids;Hypertension Weight Management/Obesity;Stress;Tobacco Cessation;Lipids;Hypertension    Review Wendy Lopez started cardiac rehab on 12/14/22 and did well with exercise. Vital signs were stable. Wendy Lopez last session at  cardiac rehab was on 12/26/22. Wendy Lopez has been absent with an URI. Wendy Lopez has stopped participation due to cost and has been discharged from the program Wendy Lopez returned to exercise at cardiac rehab today as her appointments were not cancelled. No complaints during exercise.    Expected Outcomes Wendy Lopez will continue to participate in cardiac rehab for exercise, nutrition and lifestyle modifications Wendy Lopez will continue to participate in cardiac rehab for exercise, nutrition and lifestyle modifications Wendy Lopez will continue to participate in cardiac rehab for exercise, nutrition and lifestyle modifications Wendy Lopez will continue to participate in cardiac rehab for exercise, nutrition and lifestyle modifications             Exercise Goals and Review:  Exercise Goals     Row Name 12/06/22 1118             Exercise Goals   Increase Physical Activity Yes       Intervention Provide advice, education, support and counseling about physical activity/exercise needs.;Develop an individualized exercise prescription for aerobic and resistive training based on initial evaluation findings, risk stratification, comorbidities and participant's personal goals.       Expected Outcomes Short Term: Attend rehab on a regular basis to increase amount of physical activity.;Long Term: Add in home exercise to make exercise part of routine and to increase amount of physical activity.;Long Term: Exercising regularly at least 3-5 days a week.  Increase Strength and Stamina Yes       Intervention Provide advice,  education, support and counseling about physical activity/exercise needs.;Develop an individualized exercise prescription for aerobic and resistive training based on initial evaluation findings, risk stratification, comorbidities and participant's personal goals.       Expected Outcomes Short Term: Increase workloads from initial exercise prescription for resistance, speed, and METs.;Short Term: Perform resistance training exercises routinely during rehab and add in resistance training at home;Long Term: Improve cardiorespiratory fitness, muscular endurance and strength as measured by increased METs and functional capacity ( )       Able to understand and use rate of perceived exertion (RPE) scale Yes       Intervention Provide education and explanation on how to use RPE scale       Expected Outcomes Short Term: Able to use RPE daily in rehab to express subjective intensity level;Long Term:  Able to use RPE to guide intensity level when exercising independently       Knowledge and understanding of Target Heart Rate Range (THRR) Yes       Intervention Provide education and explanation of THRR including how the numbers were predicted and where they are located for reference       Expected Outcomes Short Term: Able to state/look up THRR;Long Term: Able to use THRR to govern intensity when exercising independently;Short Term: Able to use daily as guideline for intensity in rehab       Able to check pulse independently Yes       Intervention Provide education and demonstration on how to check pulse in carotid and radial arteries.;Review the importance of being able to check your own pulse for safety during independent exercise       Expected Outcomes Short Term: Able to explain why pulse checking is important during independent exercise;Long Term: Able to check pulse independently and accurately       Understanding of Exercise Prescription Yes       Intervention Provide education, explanation, and written  materials on patient's individual exercise prescription       Expected Outcomes Short Term: Able to explain program exercise prescription;Long Term: Able to explain home exercise prescription to exercise independently                Exercise Goals Re-Evaluation:  Exercise Goals Re-Evaluation     Row Name 12/14/22 1611             Exercise Goal Re-Evaluation   Exercise Goals Review Increase Physical Activity;Understanding of Exercise Prescription;Increase Strength and Stamina;Knowledge and understanding of Target Heart Rate Range (THRR);Able to understand and use rate of perceived exertion (RPE) scale       Comments Pt first day in the [facility] program. Pt tolerated exercise well with an average MET level of 1.8. Pt is learning her THRR, RPE and ExRx       Expected Outcomes Will continue to monitor pt and progress workloads as tolerated without sign or symptom                Nutrition & Weight - Outcomes:  Pre Biometrics - 12/06/22 1013       Pre Biometrics   Waist Circumference 32 inches    Hip Circumference 40 inches    Waist to Hip Ratio 0.8 %    Triceps Skinfold 28 mm    % Body Fat 36.2 %    Grip Strength 22 kg    Flexibility --   Not performed, lumbar  spine fusion.   Single Leg Stand 5.75 seconds              Nutrition:  Nutrition Therapy & Goals - 02/10/23 1521       Nutrition Therapy   Diet Heart Healthy Diet    Drug/Food Interactions Statins/Certain Fruits      Personal Nutrition Goals   Nutrition Goal Patient to identify strategies for reducing cardiovascular risk by attending the [facility] education and nutrition series weekly.   goal in progress.   Personal Goal #2 Patient to improve diet quality by using the plate method as a guide for meal planning to include lean protein/plant protein, fruits, vegetables, whole grains, nonfat dairy as part of a well-balanced diet   goal in progress.   Personal Goal #3 Patient to reduce sodium to 1500mg  per  day   goal in progress.   Comments Goals in progress. Wendy Lopez has medical history of breast cancer, STEMI, HTN, tobacco abuse. Wendy Lopez continues to attend the [employer] and nutrition series regularly, though attendance overall has been variable. She will begin repatha as she is unable to take statin medications per documenation on 01/10/23. She has stopped smoking as of July 2024. She is up 3.5# since starting with our program. Patient will benefit from participation in intensive cardiac rehab for nutrition, exercise, and lifestyle modification.      Intervention Plan   Intervention Prescribe, educate and counsel regarding individualized specific dietary modifications aiming towards targeted core components such as weight, hypertension, lipid management, diabetes, heart failure and other comorbidities.;Nutrition handout(s) given to patient.    Expected Outcomes Short Term Goal: Understand basic principles of dietary content, such as calories, fat, sodium, cholesterol and nutrients.;Long Term Goal: Adherence to prescribed nutrition plan.             Nutrition Discharge:  Nutrition Assessments - 12/14/22 1533       Rate Your Plate Scores   Pre Score 77             Education Questionnaire Score:  Knowledge Questionnaire Score - 12/06/22 1126       Knowledge Questionnaire Score   Pre Score 26/28             Wendy Lopez attended 16 exercise sessions between 12/06/22- 02/13/23. Wendy Lopez's attendance was sporadic. Wendy Lopez stopped participation in cardiac rehab due to problems with her back.Wendy Headings RN BSN

## 2023-03-01 ENCOUNTER — Encounter (HOSPITAL_COMMUNITY): Payer: Medicare Other

## 2023-03-08 ENCOUNTER — Ambulatory Visit: Payer: Medicare Other | Attending: Cardiovascular Disease | Admitting: Cardiovascular Disease

## 2023-03-08 ENCOUNTER — Telehealth: Payer: Self-pay | Admitting: Pharmacist

## 2023-03-08 ENCOUNTER — Encounter: Payer: Self-pay | Admitting: Cardiovascular Disease

## 2023-03-08 VITALS — BP 136/83 | HR 73 | Ht 62.0 in | Wt 146.0 lb

## 2023-03-08 DIAGNOSIS — G40909 Epilepsy, unspecified, not intractable, without status epilepticus: Secondary | ICD-10-CM | POA: Diagnosis not present

## 2023-03-08 DIAGNOSIS — I25118 Atherosclerotic heart disease of native coronary artery with other forms of angina pectoris: Secondary | ICD-10-CM | POA: Diagnosis not present

## 2023-03-08 DIAGNOSIS — T466X5A Adverse effect of antihyperlipidemic and antiarteriosclerotic drugs, initial encounter: Secondary | ICD-10-CM | POA: Insufficient documentation

## 2023-03-08 DIAGNOSIS — T466X5D Adverse effect of antihyperlipidemic and antiarteriosclerotic drugs, subsequent encounter: Secondary | ICD-10-CM

## 2023-03-08 DIAGNOSIS — I10 Essential (primary) hypertension: Secondary | ICD-10-CM | POA: Insufficient documentation

## 2023-03-08 DIAGNOSIS — R002 Palpitations: Secondary | ICD-10-CM | POA: Diagnosis not present

## 2023-03-08 DIAGNOSIS — Z720 Tobacco use: Secondary | ICD-10-CM

## 2023-03-08 DIAGNOSIS — G720 Drug-induced myopathy: Secondary | ICD-10-CM | POA: Insufficient documentation

## 2023-03-08 DIAGNOSIS — E785 Hyperlipidemia, unspecified: Secondary | ICD-10-CM | POA: Insufficient documentation

## 2023-03-08 DIAGNOSIS — I2119 ST elevation (STEMI) myocardial infarction involving other coronary artery of inferior wall: Secondary | ICD-10-CM | POA: Diagnosis not present

## 2023-03-08 MED ORDER — AMLODIPINE BESYLATE 2.5 MG PO TABS
2.5000 mg | ORAL_TABLET | Freq: Every day | ORAL | 3 refills | Status: DC
Start: 2023-03-08 — End: 2023-10-16

## 2023-03-08 NOTE — Telephone Encounter (Signed)
Patient has a latex allergy. Please complete prior authorization for Praluent 75mg 

## 2023-03-08 NOTE — Progress Notes (Signed)
Patient ID: Wendy Lopez, female   DOB: [birth year], 62 y.o.   MRN: [id number]       HPI: Wendy Lopez is a 62 y.o. female who presents for a 5 month cardiology follow-up evaluation.   Wendy Lopez has a long-standing history of tobacco abuse and started smoking at age 10.  In 2009 cardiac catheterization revealed normal coronary arteries. In February 2012 a nuclear perfusion study was done after she experienced episodes of chest pain and shortness of breath and this revealed normal perfusion. An echo Doppler study demonstrated mild pulmonary hypertension with estimated RV systolic pressure of 36 mm. Because of issues of recurrent shortness of breath she underwent a cardiopulmonary met test and was found to have a blunted chronotropic response to exercise which is making it difficult for her to improve her endurance and exercise capacity. She has a history of tachypalpitations. She has a history of invasive ductal carcinoma and is status post right mastectomy with sentinel node biopsy and is status post chemotherapy. She has a history of vitamin D insufficiency mild blood pressure elevation. There is also a history of depression.  On 10/16/2012 , a 2-D echo Doppler study  revealed an ejection fraction in the range of 60-65%. She did not have wall motion abnormalities and had normal diastolic function. There was evidence for right ventricular hypertrophy with normal RV function. She again had mild pulmonary hypertension with an estimated pressure 39 mm. There is moderate tricuspid regurgitation. Wendy Lopez continues to smoke cigarettes. She does note some indigestion. He also has noticed some left leg weakness and has issues with L4-L5 disc disease. She previously was on a higher dose of present Brintellex but due to tremors she has reduced his dose to 5 mg daily.  A subsequent nuclear perfusion study was done on 11/20/2012 after her ECG revealed slight additional T-wave abnormalities and was  essentially normal without wall motion abnormality, scar or ischemia.  She has a history of cervical disc disease and had noted some upper back discomfort in the past.  In December 2015, her EKG had shown  more pronounced downsloping ST segment depression in the inferior to inferolateral leads.  I recommended a subsequent nuclear perfusion study.  This was done on 04/17/2014 , which was not significant change from her previous study.  She again had basal inferolateral ST-T changes with T-wave inversion in V3 through V4 which did not significant change with stress.  She had normal perfusion with post-rest ejection fraction at 65%.  When seen in 2016 she was smoking one half pack of cigarettes per day.  She had had medication issues and was on Keppra in place of Lamictal for seizures due to cost. Recently, she  had some issue with metoprolol succinate.  She had self reduced this to 100 mg per day.  When I saw her several months ago.  She was wanting to switch to a shorter acting version, however, noted more palpitations on the tartrate preparation. As result, she put herself back on long-acting Toprol.  I saw her in October 2018 and over the 2 years previously she was  without anginal symptoms.  She quit smoking in December 2017, but unfortunately resumed during the period of increased stress 4 months later.  Unfortunately she was still smoking cigarettes.  She was no longer having caffeine or chocolate.  She denies any exertional chest pain.  Her palpitations have been fairly well controlled on Toprol 200 mg daily.  She denied seizure activity and is  on Keppra and Dilantin.    She has undergone several additional evaluations with Wendy Chard, NP and Wendy Course, PA in November and December 2019.  She had experienced intermittent midsternal chest pain/tightness without radiation which she described to Wendy Chard, NP on 02/16/2018.  She subsequently underwent a Lexiscan Myoview which was normal.  EF was  greater than 65%.  Because of potential lower extremity claudication issues on March 06, 2018 lower semi-Doppler imaging revealed normal ABIs bilaterally.  An echo Doppler study November 2019 showed an EF of 55 to 60% without wall motion abnormalities.  She had a mildly thickened mitral valve without frank prolapse.  She was seen by Wendy Course, PA in follow-up.  She also underwent nerve conduction studies by Wendy Lopez and she does not have peripheral neuropathy.  She continues to have issues with her neck and since Dr. Channing Mutters has retired she is now seeing Wendy Lopez.  Unfortunately she still smoking 1/2 to 1 pack/day.  She admits to occasional palpitations at night with heart rates going up into the 110 range.  Apparently she had had a home study for evaluation of potential sleep apnea by Dr. Eula Lopez.  I saw her in February 08, 2019.  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  She wore an event monitor from July 30 through November 28, 2018.  She was in sinus rhythm with an average rate at 70 bpm.  The fastest heart rate was sinus tachycardia at 112 bpm.  She did not have any episodes of atrial fibrillation, SVT, or prolonged bradycardia or ectopy.  She continues to smoke cigarettes.  She does undergo yearly low-dose CT imaging for lung cancer screening.   She has been evaluated by Wendy Lopez on several occasions, most recently in November 2021.  She had worn a cardiac monitor from October 12 through February 11, 2020.  The predominant rhythm was sinus with an average rate of 81 bpm.  The slowest heart rate was sinus bradycardia 54 bpm.  There was a 6 beat burst of SVT at 176 bpm and there were a total of 21 short-lived episodes of SVT with the longest lasting 15 beats at an average rate at 103 bpm.  There were rare PACs, atrial couplets and triplets less than 1% and there were rare isolated PVCs less than  1%.  She was evaluated by [facility] neurology, Wendy Ege, NP in July 2022 for her seizures.  She had Dr. Anne Hahn for her neurologist who is recently retired.  I last saw her on January 27, 2021.  She apparently had a short-lived recent seizure 1 week ago and has required brand-name medications and is on Dilantin extended release 100 mg 3 times a day, Keppra 500 mg in the morning and 1000 mg at night and has been on metoprolol succinate 100 mg daily.  She is on sertraline 10 mg.  She denied chest pain orrecent palpitations.  Unfortunately she still smokes.  At that time, her resting pulse was well-controlled on metoprolol 100 mg daily.  I again discussed the importance of complete discontinuance of tobacco.  She undergoes yearly low-dose chest CTA's in light of her longstanding tobacco history and she continues to see Dr. Ellin Saba for oncology follow-up.  I saw her on April 28, 2022. She continued to smoke , now reduced at approximately 1 pack/week.  She has not had recent seizures.  She continues  to be on Keppra.  She is taking metoprolol succinate 100 mg daily.  She is followed by Dr. Tonia Brooms of pulmonary and is on Stiolto Respimat.  She denies chest tightness.  She sees Dr. Eula Lopez for primary care at [facility].  She has documented aortic atherosclerosis.  She has not had recent laboratory.  During that evaluation, I recommended follow-up laboratory with comprehensive metabolic panel, lipid studies, TSH, LP(a), as well as vitamin D and B12 levels since these had been abnormal previously.  With her continued tobacco history as well as aortic atherosclerosis I recommended a baseline calcium score.  I again stressed the importance of complete smoking cessation.  She underwent a calcium score on June 14, 2022 which was excellent at 1, representing 59th percentile.  Chest CT over read did not show any extracardiac findings.  She continues to be evaluated at [facility] neurology with Dr. Teresa Coombs and sees Dr.  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  I last saw her on October 07, 2022.  She was having issues with right-sided abdominal pain and was felt possibly to have gallbladder disease.  She has been evaluated by GI and is scheduled to undergo EGD and colonoscopy next week.  She apparently developed myalgias on rosuvastatin.  When she saw her primary physician she was given a prescription for atorvastatin to try instead but she did not institute this.  She has been on losartan at just 25 mg and metoprolol succinate 100 mg daily for blood pressure control.  She is unaware of recurrent seizures on Keppra.  She continues to be on Stiolto Respimat with her lung disease followed by Dr. Tonia Brooms.    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  She was taken urgently to the catheterization laboratory and she underwent successful intervention with insertion of synergy 3.5 x 28 mm stent.  EF was 45 to 50% by visual estimate.  An echo Doppler study done on October 24, 2022 showed EF at 50% with grade 1 diastolic dysfunction.  There was mid to basal inferoseptal and basal inferior akinesis.  There is grade 1 diastolic dysfunction.  She was discharged on October 25, 2022, but subsequently readmitted on October 26, 2022 with recurrent symptomatology.  She underwent repeat cardiac catheterization which showed a widely patent RCA stent and no other concomitant CAD.  She was seen in follow-up of her hospitalization on November 30, 2022 by Carlos Levering.  NP and was feeling improved.  Her dose of atorvastatin had been decreased due to symptoms and it was recommended that if she could not tolerate the reduced dose she would need to transition to new therapy.  Presently, Ms. Shawn feels well.  She denies recurrent chest pain.  She has quit tobacco use.  She is no longer on statin therapy due to intolerability with knee and  arm joint discomfort.  She has now been approved for Repatha.  She was given 2 samples to initiate treatment.  She continues to be on DAPT with aspirin/Brilinta.  She is not having recurrent angina on isosorbide only 15 mg, metoprolol succinate 100 mg daily.  She takes Stiolto Respimat for her lung disease.  She is on Keppra followed by [facility] neurology and has been without recurrent seizure activity.  She is starting cardiac rehab.  She tells me her sister died last month with an aortic dissection.  Lipid panel from July  2024 showed an LDL of 113, triglycerides 71 total cholesterol 197.  She presents for reevaluation.   Past Medical History:  Diagnosis Date   Anxiety and depression    Arthritis    Breast cancer (HCC) 02/13/2009   Cancer of right breast (HCC) 04/16/2012   Coronary artery disease    Depression    Depression 04/18/2012   Dysrhythmia    palpitations   Epilepsy (HCC)    last seizure 1990's   H/O hiatal hernia    Hyperlipidemia    Hypertension    Localization-related (focal) (partial) epilepsy and epileptic syndromes with complex partial seizures, without mention of intractable epilepsy 02/07/2013   Memory deficit 02/07/2013   Memory loss    Mitral valve prolapse 06/01/2010   echo- EF>55% mild tricupsid regurgitation. There is mild Pulmonary Hypertension.. Right  ventricular systolic pressure iselevated at 30-40 mmHg. LV systolic function is normal.   Osteopenia    PONV (postoperative nausea and vomiting)    SOB (shortness of breath) 10/18/2011   met-test   Spinal fusion failure    3    Past Surgical History:  Procedure Laterality Date   BACK SURGERY     screws- lumbar   BREAST SURGERY  04/18/2008   mastectomy - RIGHT   BRONCHIAL WASHINGS  04/01/2021   Procedure: BRONCHIAL WASHINGS;  Surgeon: Josephine Igo, DO;  Location: [facility];  Service: Cardiopulmonary;;   CARDIAC CATHETERIZATION     COLONOSCOPY WITH PROPOFOL  03/13/2012   Procedure: COLONOSCOPY  WITH PROPOFOL;  Surgeon: Charolett Bumpers, MD;  Location: [facility];  Service: Endoscopy;  Laterality: N/A;   CORONARY/GRAFT ACUTE MI REVASCULARIZATION N/A 10/23/2022   Procedure: Coronary/Graft Acute MI Revascularization;  Surgeon: Kathleene Hazel, MD;  Location: [facility];  Service: Cardiovascular;  Laterality: N/A;   ESOPHAGOGASTRODUODENOSCOPY  03/13/2012   Procedure: ESOPHAGOGASTRODUODENOSCOPY (EGD);  Surgeon: Charolett Bumpers, MD;  Location: [facility];  Service: Endoscopy;  Laterality: N/A;  Reflux, Hiatal Hernia   HAND TENDON SURGERY  04/18/2010   tendons released in right hand    LEFT HEART CATH AND CORONARY ANGIOGRAPHY N/A 10/23/2022   Procedure: LEFT HEART CATH AND CORONARY ANGIOGRAPHY;  Surgeon: Kathleene Hazel, MD;  Location: [facility];  Service: Cardiovascular;  Laterality: N/A;   LEFT HEART CATH AND CORONARY ANGIOGRAPHY N/A 10/26/2022   Procedure: LEFT HEART CATH AND CORONARY ANGIOGRAPHY;  Surgeon: Orbie Pyo, MD;  Location: [facility];  Service: Cardiovascular;  Laterality: N/A;   MASTECTOMY, RADICAL  02/13/2009   SPINE SURGERY  1999, 2003, 2005   TONSILLECTOMY     VIDEO BRONCHOSCOPY Bilateral 04/01/2021   Procedure: VIDEO BRONCHOSCOPY WITHOUT FLUORO;  Surgeon: Josephine Igo, DO;  Location: [facility];  Service: Cardiopulmonary;  Laterality: Bilateral;  Ultrathin bronchoscopy    Allergies  Allergen Reactions   Carbamazepine Anaphylaxis   Lamictal [Lamotrigine] Anaphylaxis    SOB, Lamotrigine only   Omeprazole Anaphylaxis   Other     STATES HAS SHORTNESS OF BREATH WITH GENERIC DRUGS     ALSO RASH AND ITCHING   Pantoprazole Anaphylaxis   Prednisone Other (See Comments), Rash and Shortness Of Breath    Prescribing MD told patient he thinks dose (dose pack) was "just too high.", 03/09/11   STATES HAS TAKEN SMALLER DOSE AND STILL HAD RASH AND JITTERINESS, Prescribing MD told patient he thinks dose (dose pack) was "just  too high.", 03/09/11   STATES HAS TAKEN SMALLER DOSE AND STILL HAD RASH AND  JITTERINESS   Proton Pump Inhibitors Anaphylaxis   Pseudoephedrine Hcl Er Other (See Comments)    SEIZURES.   Valium Other (See Comments)    Hallucinations, spasticity, hyper-relfexive    Amlodipine Rash    Face swell   Oxycodone Nausea And Vomiting, Rash and Other (See Comments)   Ibuprofen Swelling   Percocet [Oxycodone-Acetaminophen] Nausea And Vomiting   Rosuvastatin Diarrhea   Statins     myalgia   Trokendi [employer] Er] Other (See Comments)    Made headache worse, dizziness, difficulty breathing, rash   Betadine [Povidone Iodine] Rash   Latex Dermatitis and Rash   Shellfish Allergy Swelling    Current Outpatient Medications  Medication Sig Dispense Refill   amLODipine (NORVASC) 2.5 MG tablet Take 1 tablet (2.5 mg total) by mouth daily. 180 tablet 3   aspirin EC 81 MG tablet Take 1 tablet (81 mg total) by mouth daily. Swallow whole. 120 tablet 3   Cyanocobalamin (VITAMIN B-12) 3000 MCG/ML LIQD Inject 3,000 mcg as directed every 30 (thirty) days.     EPINEPHrine 0.3 mg/0.3 mL IJ SOAJ injection Inject 0.3 mg into the muscle as needed for anaphylaxis.     gabapentin (NEURONTIN) 100 MG capsule Take 1 capsule (100 mg total) by mouth at bedtime. 30 capsule 0   isosorbide mononitrate (IMDUR) 30 MG 24 hr tablet Take 0.5 tablets (15 mg total) by mouth daily. 45 tablet 1   levETIRAcetam (KEPPRA) 750 MG tablet Take 1 tablet (750 mg total) by mouth 2 (two) times daily. 180 tablet 3   metoprolol succinate (TOPROL-XL) 100 MG 24 hr tablet TAKE 1 TABLET(100 MG) BY MOUTH DAILY WITH OR IMMEDIATELY FOLLOWING A MEAL 90 tablet 3   Misc. Devices MISC Please supply with Prosthetic Mastectomy Bras 1 each 6   nicotine (NICODERM CQ - DOSED IN MG/24 HOURS) 14 mg/24hr patch Place 1 patch (14 mg total) onto the skin daily. 30 patch 0   nitroGLYCERIN (NITROSTAT) 0.4 MG SL tablet Place 1 tablet (0.4 mg total) under the tongue  every 5 (five) minutes as needed. 25 tablet 2   sodium chloride (OCEAN) 0.65 % nasal spray Place 1-2 sprays into the nose as needed for congestion.     ticagrelor (BRILINTA) 90 MG TABS tablet TAKE 1 TABLET BY MOUTH TWICE DAILY 180 tablet 1   tiZANidine (ZANAFLEX) 2 MG tablet Take 2 mg by mouth as needed.     ZYRTEC ALLERGY 10 MG tablet Take 10 mg by mouth at bedtime as needed for allergies.     Alirocumab (PRALUENT) 75 MG/ML SOAJ Inject 1 mL (75 mg total) into the skin every 14 (fourteen) days. 2 mL 5   Tiotropium Bromide-Olodaterol (STIOLTO RESPIMAT) 2.5-2.5 MCG/ACT AERS Inhale 2 puffs into the lungs daily. (Patient not taking: Reported on 03/08/2023) 4 g 5   No current facility-administered medications for this visit.    Social History   Socioeconomic History   Marital status: Married    Spouse name: Not on file   Number of children: 1   Years of education: college 3   Highest education level: Not on file  Occupational History   Occupation: unemployed    Employer: UNEMPLOYED    Comment: 2003  Tobacco Use   Smoking status: Some Days    Current packs/day: 1.00    Average packs/day: 1 pack/day for 43.0 years (43.0 ttl pk-yrs)    Types: Cigarettes   Smokeless tobacco: Never   Tobacco comments:    Smokes a 1/2 of  a cigarette a week. Has talked to [facility]. Offered virtual cessation classes declined. 12/06/22 MWW RN  Substance and Sexual Activity   Alcohol use: No    Alcohol/week: 0.0 standard drinks of alcohol   Drug use: No   Sexual activity: Not on file  Other Topics Concern   Not on file  Social History Narrative   Patient lives at home with husband   Patient is right handed.   Patient does not drink caffeine.   Social Determinants of Health   Financial Resource Strain: Not on file  Food Insecurity: No Food Insecurity (10/27/2022)   Hunger Vital Sign    Worried About Running Out of Food in the Last Year: Never true    Ran Out of Food in the Last Year: Never true   Transportation Needs: No Transportation Needs (10/27/2022)   PRAPARE - Administrator, Civil Service (Medical): No    Lack of Transportation (Non-Medical): No  Physical Activity: Not on file  Stress: Not on file  Social Connections: Not on file  Intimate Partner Violence: Not At Risk (10/27/2022)   Humiliation, Afraid, Rape, and Kick questionnaire    Fear of Current or Ex-Partner: No    Emotionally Abused: No    Physically Abused: No    Sexually Abused: No    Social she is married and has one child. She has been smoking since age 33.  Fortunately quit tobacco following her July 2024 STEMI.  ROS General: Negative; No fevers, chills, or night sweats;  HEENT: Negative; No changes in vision or hearing, sinus congestion, difficulty swallowing Pulmonary: Negative; No cough, wheezing, shortness of breath, hemoptysis Cardiovascular: See HPI GI: Recent abdominal pain. GU: Negative; No dysuria, hematuria, or difficulty voiding Musculoskeletal: Neck and leg discomfort Hematologic/Oncology: Remote history of stage I breast CA of the right breast with recent evaluation felt to be stable. Endocrine: Negative; no heat/cold intolerance; no diabetes Neuro: history of seizure disorder for which she had been on chronic Lamictal and Dilantin and was followed by Dr. Anne Hahn; previously she had been followed by Dr. Sandria Manly.  She was switched to Keppra due to cost since she could not take generic Lamictal.  Intermittent paresthesias in her extremities   Skin: Negative; No rashes or skin lesions Psychiatric: Negative; No behavioral problems, depression Sleep: Fatigability:  No snoring, daytime sleepiness, hypersomnolence, bruxism, restless legs, hypnogognic hallucinations, no cataplexy Other comprehensive 14 point system review is negative.   PE BP 136/83   Pulse 73   Ht 5\' 2"  (1.575 m)   Wt 146 lb (66.2 kg)   LMP 11/03/1993   SpO2 97%   BMI 26.70 kg/m    Repeat blood pressure by me was  150/80  Wt Readings from Last 3 Encounters:  03/08/23 146 lb (66.2 kg)  02/09/23 148 lb 11.2 oz (67.4 kg)  01/11/23 144 lb 8 oz (65.5 kg)   General: Alert, oriented, no distress.  Skin: normal turgor, no rashes, warm and dry HEENT: Normocephalic, atraumatic. Pupils equal round and reactive to light; sclera anicteric; extraocular muscles intact;  Nose without nasal septal hypertrophy Mouth/Parynx benign; Mallinpatti scale 3  Neck: No JVD, no carotid bruits; normal carotid upstroke Lungs: clear to ausculatation and percussion; no wheezing or rales Chest wall: without tenderness to palpitation Heart: PMI not displaced, RRR, s1 s2 normal, 1/6 systolic murmur, no diastolic murmur, no rubs, gallops, thrills, or heaves Abdomen: soft, nontender; no hepatosplenomehaly, BS+; abdominal aorta nontender and not dilated by palpation. Back: no CVA tenderness  Pulses 2+ Musculoskeletal: full range of motion, normal strength, no joint deformities Extremities: no clubbing cyanosis or edema, Homan's sign negative  Neurologic: grossly nonfocal; Cranial nerves grossly wnl Psychologic: Normal mood and affect      EKG Interpretation Date/Time:  Wednesday March 08 2023 13:28:45 EST Ventricular Rate:  73 PR Interval:  156 QRS Duration:  68 QT Interval:  372 QTC Calculation: 409 R Axis:   46  Text Interpretation: Normal sinus rhythm Possible Left atrial enlargement Inferior infarct (cited on or before 27-Oct-2022) ST & T wave abnormality, consider lateral ischemia When compared with ECG of 09-Nov-2022 14:57, No significant change was found Confirmed by Nicki Guadalajara ([id number]) on 03/08/2023 1:47:20 PM     April 28, 2021  ECG (independently read by me):  NSR at 69, inferolateral STT changes  January 27, 2021 ECG (independently read by me):  NSR at 61, LAE, QS V1-2, inferolateral ST changes  February 08, 2019 ECG (independently read by me): Normal sinus rhythm at 66 bpm, the left atrial enlargement.   Previous inferior and anterolateral ST changes  November 07, 2018 ECG (independently read by me): Normal sinus rhythm at 69 bpm.  Possible left atrial enlargement.  Inferolateral T wave abnormality  October 2018 ECG (independently read by me): Normal sinus rhythm at 69 bpm.  Probable left atrial enlargement.  Inferolateral ST changes.  Normal intervals.  No ectopy.  June 2017 ECG (independently read by me): Normal sinus rhythm at 71 bpm.  Will biatrial enlargement.  Inferolateral ST-T wave abnormality.  Normal intervals.  March 2017 ECG (independently read by me):  Normal sinus rhythm at 77 bpm.  Biatrial enlargement.  Previously noted inferolateral T wave abnormalities.  November 2016 ECG (independently read by me): Normal sinus rhythm at 75 bpm.  Inferolateral ST-T wave abnormalities.  03/31/2014 ECG (independently read by me) : Normal sinus rhythm at 75 bpm.  There is now significantly more pronounced downsloping ST segment depression in leads II, III, and F V4 through V6 and T-wave inversion in V3 compared to her prior ECG of over one year ago.  Prior ECG: Normal sinus rhythm at 68. T-wave abnormalities  inferiorly in V3 through V6  LABS:      Latest Ref Rng & Units 02/02/2023    1:50 PM 10/28/2022    8:26 AM 10/27/2022    9:53 AM  BMP  Glucose 70 - 99 mg/dL 98  97  191   BUN 8 - 23 mg/dL 12  18  19    Creatinine 0.44 - 1.00 mg/dL 4.78  2.95  6.21   Sodium 135 - 145 mmol/L 141  136  137   Potassium 3.5 - 5.1 mmol/L 3.8  3.8  3.8   Chloride 98 - 111 mmol/L 106  100  99   CO2 22 - 32 mmol/L 26  24  25    Calcium 8.9 - 10.3 mg/dL 8.7  8.6  8.8        Latest Ref Rng & Units 02/02/2023    1:50 PM 10/27/2022    9:53 AM 10/23/2022   12:09 PM  Hepatic Function  Total Protein 6.5 - 8.1 g/dL 6.9  6.7  5.8   Albumin 3.5 - 5.0 g/dL 3.9  3.6  3.3   AST 15 - 41 U/L 19  41  43   ALT 0 - 44 U/L 17  42  30   Alk Phosphatase 38 - 126 U/L 89  66  67   Total Bilirubin 0.3 -  1.2 mg/dL 1.2  1.6  1.4         Latest Ref Rng & Units 02/02/2023    1:50 PM 10/27/2022    9:53 AM 10/26/2022    2:17 AM  CBC  WBC 4.0 - 10.5 K/uL 8.5  12.6  11.5   Hemoglobin 12.0 - 15.0 g/dL 16.1  09.6  04.5   Hematocrit 36.0 - 46.0 % 49.4  43.4  49.2   Platelets 150 - 400 K/uL 189  143  174     Lab Results  Component Value Date   MCV 88.5 02/02/2023   MCV 89.3 10/27/2022   MCV 89.1 10/26/2022    Lab Results  Component Value Date   TSH 1.625 06/14/2022     Lab Results  Component Value Date   MCV 88.5 02/02/2023   MCV 89.3 10/27/2022   MCV 89.1 10/26/2022   Lab Results  Component Value Date   TSH 1.625 06/14/2022   Lab Results  Component Value Date   HGBA1C 5.6 10/26/2022     Lipid Panel     Component Value Date/Time   CHOL 197 10/26/2022 0217   CHOL 170 02/17/2017 0820   TRIG 71 10/26/2022 0217   HDL 70 10/26/2022 0217   HDL 70 02/17/2017 0820   CHOLHDL 2.8 10/26/2022 0217   VLDL 14 10/26/2022 0217   LDLCALC 113 (H) 10/26/2022 0217   LDLCALC 87 02/17/2017 0820    RADIOLOGY: No results found.  IMPRESSION:  1. Acute ST elevation myocardial infarction (STEMI) of inferior wall [facility]): October 23, 2022, DES stent to mid RCA   2. Primary hypertension   3. Hyperlipidemia LDL goal <50   4. Palpitations   5. Statin myopathy   6. Seizure disorder (HCC)   7. Longstanding tobacco abuse; quit July 2024     ASSESSMENT AND PLAN: Ms. Arkisha Penfold is a 62 year old female who has a long-standing tobacco history. In January 2009  cardiac catheterization at the [facility] revealed normal coronary arteries. A nuclear perfusion study in 2012 showed normal perfusion. A cardiopulmonary met test in July 2013 showed blunted chronotropic response without ischemic changes but she had reduced functional status with peak O2 at 69% of predicted. She has mild pulmonary hypertension.  An echo revealed normal function without wall motion abnormalities despite her ECG changes.  A subsequent  stress test in 2015 which was done because of more abnormal ST-T changes remain stable. A repeat echo Doppler study in November 2016 was unchanged and showed an EF of 60-65%.  There was trivial TR and MR.  Pulmonary pressures were normal.  In November 2019 due to recurrent intermittent episodes of chest tightness a Lexiscan Myoview study continued to show normal perfusion and was low risk.  She had been on Toprol-XL 100 mg twice a day with her history of increased heart rates.  Her event monitor from July 30 through November 28, 2018 revealed predominant sinus rhythm with average heart beat at 70 bpm.  There was mild sinus bradycardia which occurred during sleep.  Her fastest heart rate was sinus tachycardia at 112 bpm at 7 PM while active.  She did not have any episodes of AF, SVT ectopy or prolonged bradycardia.  Dr. 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  A subsequent cardiac monitor in October 2021 showed predominant sinus rhythm but she had short-lived bursts of SVT with  the fastest lasting 6 beats at a maximum rate of 176 bpm and the longest lasting 15 beats with an average rate of 103 bpm.  Most recently, she has been on a metoprolol succinate regimen of just 100 mg daily.  Her episodic palpitations and tachycardia have been controlled with metoprolol succinate.  With her aortic atherosclerosis she had been started on a trial of rosuvastatin which she did not tolerate.  Ultimately this was changed to atorvastatin.  Since my last evaluation in June 2024, she presented with inferior STEMI and was found to have subtotal 99% thrombotic occlusion of her RCA which was successfully stented.  Initial EF was 45 to 50%.  Relook catheterization for recurrent chest pain showed the stent to be widely patent 3 days later.  Fortunately, she is now discontinued tobacco use.  Previous LPA assessment had been elevated at  121.  With her inability to tolerate statins, she will initiate Repatha and has been given to samples to initiate therapy.  [place] [employer] is her insurance.  I also have recommended the addition of Zetia 10 mg for even more aggressive lipid lowering therapy.  She has not had any anginal symptomatology.  She is only on minimal dose of isosorbide and I have recommended she can discontinue this 50 mg dose but in its place we will start amlodipine initially at 2.5 mg with her blood pressure elevation and if systolic blood pressure remains greater then 1 35-1 40, she can titrate to 5 mg.  In 3 months I will repeat comprehensive metabolic panel fasting lipid panel and will reassess LP(a) following PCSK9 inhibition which hopefully will provide a 26 to 30% reduction.  She continues to see Dr. Teresa Coombs of neurology and has not had any recurrent seizure activity on Keppra.  I will see her in 4 months for reevaluation or sooner as needed.  Lennette Bihari, MD, [facility]  03/17/2023 9:35 AM

## 2023-03-08 NOTE — Patient Instructions (Addendum)
Medication Instructions:  Begin the Amlodipine 2.5mg . Take one tablet daily.   *If you need a refill on your cardiac medications before your next appointment, please call your pharmacy*   Lab Work: Return in three months for fasting lab work. CMET, LIPID, LPa If you have labs (blood work) drawn today and your tests are completely normal, you will receive your results only by: MyChart Message (if you have MyChart) OR A paper copy in the mail If you have any lab test that is abnormal or we need to change your treatment, we will call you to review the results.   Testing/Procedures: None   Follow-Up: At [facility], you and your health needs are our priority.  As part of our continuing mission to provide you with exceptional heart care, we have created designated Provider Care Teams.  These Care Teams include your primary Cardiologist (physician) and Advanced Practice Providers (APPs -  Physician Assistants and Nurse Practitioners) who all work together to provide you with the care you need, when you need it.  We recommend signing up for the patient portal called "MyChart".  Sign up information is provided on this After Visit Summary.  MyChart is used to connect with patients for Virtual Visits (Telemedicine).  Patients are able to view lab/test results, encounter notes, upcoming appointments, etc.  Non-urgent messages can be sent to your provider as well.   To learn more about what you can do with MyChart, go to [email or web address].    Your next appointment:   4 month(s)  Provider:   Nicki Guadalajara, MD

## 2023-03-09 ENCOUNTER — Telehealth: Payer: Self-pay | Admitting: Cardiovascular Disease

## 2023-03-09 ENCOUNTER — Other Ambulatory Visit (HOSPITAL_COMMUNITY): Payer: Self-pay

## 2023-03-09 ENCOUNTER — Telehealth: Payer: Self-pay | Admitting: Pharmacy Technician

## 2023-03-09 DIAGNOSIS — I25118 Atherosclerotic heart disease of native coronary artery with other forms of angina pectoris: Secondary | ICD-10-CM

## 2023-03-09 DIAGNOSIS — E785 Hyperlipidemia, unspecified: Secondary | ICD-10-CM

## 2023-03-09 NOTE — Telephone Encounter (Signed)
Pharmacy Patient Advocate Encounter   Received notification from Pt Calls Messages that prior authorization for praluent is required/requested.   Insurance verification completed.   The patient is insured through [facility] .   Per test claim: PA required; PA submitted to above mentioned insurance via CoverMyMeds Key/confirmation #/[id number] Status is pending

## 2023-03-09 NOTE — Telephone Encounter (Signed)
I did not need this encounter. °

## 2023-03-09 NOTE — Telephone Encounter (Signed)
Pharmacy Patient Advocate Encounter  Received notification from [facility] that Prior Authorization for praluent has been APPROVED from 03/09/23 to 09/06/23. Ran test claim, Copay is $47.00- one month. This test claim was processed through [facility]- copay amounts may vary at other pharmacies due to pharmacy/plan contracts, or as the patient moves through the different stages of their insurance plan.   PA #/Case ID/Reference #: [id number]

## 2023-03-13 ENCOUNTER — Other Ambulatory Visit (HOSPITAL_COMMUNITY): Payer: Self-pay

## 2023-03-13 MED ORDER — PRALUENT 75 MG/ML ~~LOC~~ SOAJ: 75.0000 mg | SUBCUTANEOUS | 5 refills | Status: DC

## 2023-03-13 MED ORDER — PRALUENT 75 MG/ML ~~LOC~~ SOAJ
75.0000 mg | SUBCUTANEOUS | 5 refills | Status: DC
  Filled 2023-03-13: qty 2, 28d supply, fill #0

## 2023-03-13 NOTE — Addendum Note (Signed)
Addended by: Cheree Ditto on: 03/13/2023 07:52 AM   Modules accepted: Orders

## 2023-03-13 NOTE — Addendum Note (Signed)
Addended by: Cheree Ditto on: 03/13/2023 09:33 AM   Modules accepted: Orders

## 2023-03-17 ENCOUNTER — Encounter: Payer: Self-pay | Admitting: Cardiovascular Disease

## 2023-03-20 ENCOUNTER — Ambulatory Visit (HOSPITAL_COMMUNITY): Payer: Medicare Other

## 2023-03-29 ENCOUNTER — Encounter (HOSPITAL_COMMUNITY): Payer: Self-pay

## 2023-03-29 ENCOUNTER — Ambulatory Visit (HOSPITAL_COMMUNITY): Payer: Medicare Other

## 2023-05-18 ENCOUNTER — Other Ambulatory Visit: Payer: Self-pay | Admitting: Student

## 2023-05-18 ENCOUNTER — Encounter: Payer: Self-pay | Admitting: Cardiovascular Disease

## 2023-05-22 ENCOUNTER — Other Ambulatory Visit (HOSPITAL_COMMUNITY): Payer: Self-pay

## 2023-05-22 ENCOUNTER — Telehealth: Payer: Self-pay | Admitting: Pharmacy Technician

## 2023-05-22 NOTE — Telephone Encounter (Signed)
Pharmacy Patient Advocate Encounter   Received notification from Patient Advice Request messages that prior authorization for praluent is required/requested.   Insurance verification completed.   The patient is insured through [place] .   Per test claim: Refill too soon. PA is not needed at this time. Medication was filled 05/19/23. Next eligible fill date is 06/11/23. Previous PA is valid until 09/06/23. Called pharmacy to verify and patient has deductible to meet

## 2023-06-01 ENCOUNTER — Telehealth: Payer: Self-pay | Admitting: Neurology

## 2023-06-01 NOTE — Telephone Encounter (Signed)
LVM and sent mychart msg informing pt of need to reschedule 01/11/24 appt - MD out

## 2023-07-06 ENCOUNTER — Ambulatory Visit: Payer: Medicare Other | Admitting: Cardiovascular Disease

## 2023-08-16 ENCOUNTER — Other Ambulatory Visit: Payer: Self-pay | Admitting: Cardiology

## 2023-09-19 ENCOUNTER — Ambulatory Visit: Admitting: Cardiovascular Disease

## 2023-10-12 ENCOUNTER — Ambulatory Visit: Admitting: Cardiovascular Disease

## 2023-10-15 NOTE — Progress Notes (Signed)
 OFFICE NOTE:    Date:  10/16/2023  ID:  Wendy Lopez, DOB [birth year], MRN [id number] PCP: Ransom Other, MD  [facility] Providers Cardiologist:  Lonni Cash, MD       Patient Profile:  Coronary artery disease  Pharmacologic MPI 02/23/2018: Low risk CAC score 06/14/2022: 1 (59th percentile) Inf STEMI in 10/2022 s/p 3.5 x 20 mm DES to the mid RCA LHC 10/23/2022: Mid RCA 99, EF 45-50 TTE 10/24/2022: EF 50, inferoseptal and inferior AK, GR 1 DD, normal RVSF, trivial MR Recurrent chest pain >>> LHC 10/26/22: RCA stent patent SVT Monitor 02/2020: 21 brief episodes of SVT-longest 15 beats Hypertension  Hyperlipidemia 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 History of Present Illness Wendy Lopez is a 63 y.o. female who returns for follow up of CAD. Pt was last seen by Dr. Burnard in 02/2023.   She is here alone. She notes she experienced chest pain last Friday afternoon and night while sitting and watching TV. The pain is located on the right side, radiating to the left, intermittent, lasting a few seconds before easing up and then recurring. This pain differs from her previous heart attack pain, which was more of an epigastric pressure. She experiences fatigue with exertion, such as walking up stairs or running errands, which leaves her tired for two days. She can lay flat without breathing difficulties. No shortness of breath, leg swelling, or syncope. She is intolerant to statins and experienced severe side effects with Praluent , including severe stomach cramping and nausea. She had to stop Praluent . She had a severe allergic reaction to isosorbide  which caused a rash, swelling, and itching. She occasionally uses nitroglycerin , which partially alleviates her chest pain but does not  completely resolve it. She has significantly reduced her smoking to about two cigarettes per week.   ROS-See HPI    Studies Reviewed:  EKG Interpretation Date/Time:  Monday October 16 2023 09:47:23 EDT Ventricular Rate:  64 PR Interval:  156 QRS Duration:  60 QT Interval:  392 QTC Calculation: 404 R Axis:   68  Text Interpretation: Normal sinus rhythm Possible Lateral infarct , age undetermined Inferior infarct Inferior ischemia Anterolateral ischemia No change when compared to multiple prior ECGs Confirmed by Lelon Hamilton [phone]) on 10/16/2023 10:11:06 AM   Results LABS LDL: 113 (10/2022)  Risk Assessment/Calculations:          Physical Exam:  VS:  BP 110/80   Pulse 68   Ht 5' 2 (1.575 m)   Wt 151 lb (68.5 kg)   LMP 11/03/1993   SpO2 98%   BMI 27.62 kg/m        Wt Readings from Last 3 Encounters:  10/16/23 151 lb (68.5 kg)  03/08/23 146 lb (66.2 kg)  02/09/23 148 lb 11.2 oz (67.4 kg)    Constitutional:      Appearance: Healthy appearance. Not in distress.  Neck:     Vascular: JVD normal.  Pulmonary:     Breath sounds: Normal breath sounds. No wheezing. No rales.  Cardiovascular:     Normal rate. Regular rhythm.     Murmurs: There is no murmur.  Edema:    Peripheral edema absent.  Abdominal:     Palpations: Abdomen is soft.  Assessment and Plan:    Assessment & Plan Coronary artery disease involving native coronary artery of native heart with angina pectoris [facility]) Status post inferior STEMI in July 2024 treated with a DES to the mid RCA.  She had recurrent chest pain and follow-up cardiac catheterization 10/26/2022 which demonstrated patent RCA stent.  She has had recurring chest discomfort since.  Most recently, she had more severe symptoms.  She has an abnormal EKG at baseline with inferior and anterolateral T wave inversions.  This is unchanged when compared to previous tracings.  She has some symptoms that sound noncardiac.  She has other symptoms  that sound somewhat reminiscent of her previous angina.  She is intolerant to several medications.  She cannot take isosorbide  or amlodipine .  She is already on beta-blocker therapy.  She remains on ticagrelor .  She is unable to take ranolazine due to interaction with ticagrelor . - Schedule PET MPI to rule out ischemia, CMD - Schedule echocardiogram  - Continue Ticagrelor  90 mg twice daily, aspirin  81 mg daily, Toprol -XL 100 mg daily, nitroglycerin  as needed - Consider stopping ticagrelor  if stress test is low risk. - Consider adding ranolazine if stress test and echo are normal and Ticagrelor  can be discontinued - Consider referral to allergist Hyperlipidemia LDL goal <70 She is intolerant to statins, ezetimibe and PCSK9 inhibitor.  Goal LDL is at least less than 70, ideally less than 55.  I will refer her back to our Pharm.D. lipid clinic to see if we can get her set up with bempedoic acid. Primary hypertension Blood pressure controlled.  Continue metoprolol  succinate 100 mg daily      Informed Consent   Shared Decision Making/Informed Consent The risks [chest pain, shortness of breath, cardiac arrhythmias, dizziness, blood pressure fluctuations, myocardial infarction, stroke/transient ischemic attack, nausea, vomiting, allergic reaction, radiation exposure, metallic taste sensation and life-threatening complications (estimated to be 1 in 10,000)], benefits (risk stratification, diagnosing coronary artery disease, treatment guidance) and alternatives of a cardiac PET stress test were discussed in detail with Ms. Scheid and she agrees to proceed.     Dispo:  Return in about 3 months (around 01/16/2024) for DR. MCALHANY / Oni Dietzman.  Signed, Glendia Ferrier, PA-C

## 2023-10-16 ENCOUNTER — Ambulatory Visit: Attending: Physician Assistant | Admitting: Physician Assistant

## 2023-10-16 ENCOUNTER — Encounter: Payer: Self-pay | Admitting: Physician Assistant

## 2023-10-16 VITALS — BP 110/80 | HR 68 | Ht 62.0 in | Wt 151.0 lb

## 2023-10-16 DIAGNOSIS — I25119 Atherosclerotic heart disease of native coronary artery with unspecified angina pectoris: Secondary | ICD-10-CM | POA: Diagnosis not present

## 2023-10-16 DIAGNOSIS — I10 Essential (primary) hypertension: Secondary | ICD-10-CM | POA: Insufficient documentation

## 2023-10-16 DIAGNOSIS — E785 Hyperlipidemia, unspecified: Secondary | ICD-10-CM | POA: Diagnosis not present

## 2023-10-16 DIAGNOSIS — I25118 Atherosclerotic heart disease of native coronary artery with other forms of angina pectoris: Secondary | ICD-10-CM

## 2023-10-16 NOTE — Patient Instructions (Signed)
 Medication Instructions:  Your physician recommends that you continue on your current medications as directed. Please refer to the Current Medication list given to you today.  *If you need a refill on your cardiac medications before your next appointment, please call your pharmacy*  Lab Work: None ordered  If you have labs (blood work) drawn today and your tests are completely normal, you will receive your results only by: MyChart Message (if you have MyChart) OR A paper copy in the mail If you have any lab test that is abnormal or we need to change your treatment, we will call you to review the results.  Testing/Procedures: Your physician has requested that you have an echocardiogram. Echocardiography is a painless test that uses sound waves to create images of your heart. It provides your doctor with information about the size and shape of your heart and how well your heart's chambers and valves are working. This procedure takes approximately one hour. There are no restrictions for this procedure. Please do NOT wear cologne, perfume, aftershave, or lotions (deodorant is allowed). Please arrive 15 minutes prior to your appointment time.  Please note: We ask at that you not bring children with you during ultrasound (echo/ vascular) testing. Due to room size and safety concerns, children are not allowed in the ultrasound rooms during exams. Our front office staff cannot provide observation of children in our lobby area while testing is being conducted. An adult accompanying a patient to their appointment will only be allowed in the ultrasound room at the discretion of the ultrasound technician under special circumstances. We apologize for any inconvenience.      Please report to Radiology at the [facility] Main Entrance 30 minutes early for your test.  [address] [place], [place] [address]                         OR   Please report to Radiology at [facility] Main Entrance, medical mall, 30 mins prior to your test.  [address]  [place], [place]  How to Prepare for Your Cardiac PET/CT Stress Test:  Nothing to eat or drink, except water, 3 hours prior to arrival time.  NO caffeine/decaffeinated products, or chocolate 12 hours prior to arrival. (Please note decaffeinated beverages (teas/coffees) still contain caffeine).  If you have caffeine within 12 hours prior, the test will need to be rescheduled.  Medication instructions: Do not take Isosorbide  mononitrate (IMDUR ) the day before or day of test Do not take tamsulosin the day before or morning of test Hold theophylline containing medications for 12 hours. Hold Dipyridamole 48 hours prior to the test.  Diabetic Preparation: If able to eat breakfast prior to 3 hour fasting, you may take all medications, including your insulin. Do not worry if you miss your breakfast dose of insulin - start at your next meal. If you do not eat prior to 3 hour fast-Hold all diabetes (oral and insulin) medications. Patients who wear a continuous glucose monitor MUST remove the device prior to scanning.  You may take your remaining medications with water.  NO perfume, cologne or lotion on chest or abdomen area. FEMALES - Please avoid wearing dresses to this appointment.  Total time is 1 to 2 hours; you may want to bring reading material for the waiting time.  IF YOU THINK YOU MAY BE PREGNANT, OR ARE NURSING PLEASE INFORM THE TECHNOLOGIST.  In preparation for your appointment,  medication and supplies will be purchased.  Appointment availability is limited, so if you need to cancel or reschedule, please call the Radiology Department Scheduler at [phone] 24 hours in advance to avoid a cancellation fee of $100.00  What to Expect When you Arrive:  Once you arrive and check in for your appointment, you will be taken to a preparation room within the Radiology Department.  A technologist  or Nurse will obtain your medical history, verify that you are correctly prepped for the exam, and explain the procedure.  Afterwards, an IV will be started in your arm and electrodes will be placed on your skin for EKG monitoring during the stress portion of the exam. Then you will be escorted to the PET/CT scanner.  There, staff will get you positioned on the scanner and obtain a blood pressure and EKG.  During the exam, you will continue to be connected to the EKG and blood pressure machines.  A small, safe amount of a radioactive tracer will be injected in your IV to obtain a series of pictures of your heart along with an injection of a stress agent.    After your Exam:  It is recommended that you eat a meal and drink a caffeinated beverage to counter act any effects of the stress agent.  Drink plenty of fluids for the remainder of the day and urinate frequently for the first couple of hours after the exam.  Your doctor will inform you of your test results within 7-10 business days.  For more information and frequently asked questions, please visit our website: [email or web address]  For questions about your test or how to prepare for your test, please call: Cardiac Imaging Nurse Navigators Office: [phone]   Follow-Up: At [facility], you and your health needs are our priority.  As part of our continuing mission to provide you with exceptional heart care, our providers are all part of one team.  This team includes your primary Cardiologist (physician) and Advanced Practice Providers or APPs (Physician Assistants and Nurse Practitioners) who all work together to provide you with the care you need, when you need it.  Your next appointment:   3 month(s)  Provider:   Lonni Cash, MD or Glendia Ferrier, PA-C          We recommend signing up for the patient portal called MyChart.  Sign up information is provided on this After Visit Summary.  MyChart is used to  connect with patients for Virtual Visits (Telemedicine).  Patients are able to view lab/test results, encounter notes, upcoming appointments, etc.  Non-urgent messages can be sent to your provider as well.   To learn more about what you can do with MyChart, go to [email or web address].   Other Instructions

## 2023-10-16 NOTE — Assessment & Plan Note (Signed)
 Blood pressure controlled.  Continue metoprolol succinate 100 mg daily

## 2023-10-24 ENCOUNTER — Ambulatory Visit (HOSPITAL_COMMUNITY)
Admission: RE | Admit: 2023-10-24 | Discharge: 2023-10-24 | Disposition: A | Discharge status: Home / Self Care | Source: Ambulatory Visit | Attending: Cardiology | Admitting: Cardiology

## 2023-10-24 DIAGNOSIS — I25119 Atherosclerotic heart disease of native coronary artery with unspecified angina pectoris: Secondary | ICD-10-CM | POA: Insufficient documentation

## 2023-10-24 LAB — ECHOCARDIOGRAM COMPLETE
Area-P 1/2: 2.15 cm2
S' Lateral: 3.1 cm

## 2023-10-25 ENCOUNTER — Ambulatory Visit: Payer: Self-pay | Admitting: Physician Assistant

## 2023-10-25 ENCOUNTER — Encounter: Payer: Self-pay | Admitting: Physician Assistant

## 2023-10-25 DIAGNOSIS — I340 Nonrheumatic mitral (valve) insufficiency: Secondary | ICD-10-CM | POA: Insufficient documentation

## 2023-11-03 ENCOUNTER — Encounter (HOSPITAL_COMMUNITY): Payer: Self-pay

## 2023-11-07 ENCOUNTER — Ambulatory Visit (HOSPITAL_COMMUNITY)
Admission: RE | Admit: 2023-11-07 | Discharge: 2023-11-07 | Disposition: A | Discharge status: Home / Self Care | Source: Ambulatory Visit | Attending: Physician Assistant | Admitting: Physician Assistant

## 2023-11-07 DIAGNOSIS — I25119 Atherosclerotic heart disease of native coronary artery with unspecified angina pectoris: Secondary | ICD-10-CM | POA: Insufficient documentation

## 2023-11-07 LAB — NM PET CT CARDIAC PERFUSION MULTI W/ABSOLUTE BLOODFLOW
MBFR: 2.43
Nuc Rest EF: 60 %
Nuc Stress EF: 62 %
Rest MBF: 0.69 ml/g/min
Rest Nuclear Isotope Dose: 18 mCi
ST Depression (mm): 0 mm
Stress MBF: 1.68 ml/g/min
Stress Nuclear Isotope Dose: 17.9 mCi
TID: 1.04

## 2023-11-07 MED ORDER — REGADENOSON 0.4 MG/5ML IV SOLN
INTRAVENOUS | Status: AC
  Filled 2023-11-07: qty 5

## 2023-11-07 MED ORDER — RUBIDIUM RB82 GENERATOR (RUBYFILL)
17.8500 | PACK | Freq: Once | INTRAVENOUS | Status: AC
  Administered 2023-11-07: 17.85 via INTRAVENOUS

## 2023-11-07 MED ORDER — RUBIDIUM RB82 GENERATOR (RUBYFILL)
17.9500 | PACK | Freq: Once | INTRAVENOUS | Status: AC
  Administered 2023-11-07: 17.95 via INTRAVENOUS

## 2023-11-07 MED ORDER — REGADENOSON 0.4 MG/5ML IV SOLN
0.4000 mg | Freq: Once | INTRAVENOUS | Status: AC
  Administered 2023-11-07: 0.4 mg via INTRAVENOUS

## 2023-11-07 NOTE — Progress Notes (Signed)
 Pt. Tolerated lexi scan well.

## 2023-11-10 NOTE — Telephone Encounter (Signed)
 Called pt, as I remembered we discussed this, but I didn't want to adjust med list without verifying again.  Pt had already restarted Aspirin  81 mg taking 1 daily.  Med list adjusted.

## 2023-11-13 ENCOUNTER — Encounter: Payer: Self-pay | Admitting: Internal Medicine

## 2023-11-13 ENCOUNTER — Ambulatory Visit: Attending: Internal Medicine | Admitting: Internal Medicine

## 2023-11-13 VITALS — BP 123/66 | HR 75 | Ht 62.0 in | Wt 151.0 lb

## 2023-11-13 DIAGNOSIS — I10 Essential (primary) hypertension: Secondary | ICD-10-CM | POA: Insufficient documentation

## 2023-11-13 DIAGNOSIS — I200 Unstable angina: Secondary | ICD-10-CM | POA: Insufficient documentation

## 2023-11-13 DIAGNOSIS — I25119 Atherosclerotic heart disease of native coronary artery with unspecified angina pectoris: Secondary | ICD-10-CM | POA: Diagnosis not present

## 2023-11-13 DIAGNOSIS — Z720 Tobacco use: Secondary | ICD-10-CM | POA: Insufficient documentation

## 2023-11-13 MED ORDER — ISOSORBIDE MONONITRATE ER 30 MG PO TB24: 30.0000 mg | ORAL_TABLET | Freq: Every day | ORAL | 1 refills | Status: DC

## 2023-11-13 NOTE — H&P (View-Only) (Signed)
 Cardiology Office Note:  .    Date:  11/13/2023  ID:  Wendy Lopez, DOB 1960/06/21, MRN [id number] PCP: Ransom Other, MD  [facility] Providers Cardiologist:  Lonni Cash, MD Cardiology APP:  Lelon Glendia DASEN, PA-C     CC: F/u stress test  History of Present Illness: .    Wendy Lopez is a 63 y.o. female with complex CAD.  She has a history of coronary artery disease with a prior right coronary artery infarct and T wave abnormalities. Myocardial perfusion imaging in the past showed evidence of an infarct, as the patient recalls being told by her doctors. She experiences chest pressure, particularly during exercise, and reports that her symptoms seemed to improve after she stopped taking certain medications.  She has a complex medication history with intolerance to multiple medications including statins, Zetia, and PCSK9 inhibitors. She cannot take ranolazine due to its interaction with ticagrelor . She reports an allergy to a synthetic binder found in many generic and some brand name medications, necessitating the use of EpiPens. She is currently unable to find a specific manufacturer of isosorbide  mononitrate ([employer] INGENUS) that she can tolerate due to a potential shortage.  She describes a significant episode prior to a previous Sunday where she felt as though 'somebody was ripping my esophagus out,' which took her breath away but was not accompanied by pain.  She has a family history of heart disease, with many relatives having had heart attacks, and she has performed CPR on a cousin before. Despite her family history, she did not have a heart attack until later in life.  She has a history of smoking, which she describes as her 'favorite' habit, and acknowledges the potential impact on her health. She has undergone multiple stress tests and heart catheterizations in the past, and she reports feeling poorly after stress tests, with symptoms of pressure during  exercise.  Discussed the use of AI scribe software for clinical note transcription with the patient, who gave verbal consent to proceed.   Relevant histories: .  Social  - CM patient; strong history  ROS: As per HPI.   Studies Reviewed: .     Cardiac Studies & Procedures   ______________________________________________________________________________________________ CARDIAC CATHETERIZATION  CARDIAC CATHETERIZATION 10/26/2022  Conclusion   Non-stenotic Mid RCA lesion was previously treated.  1.  Widely patent right coronary artery stent. 2.  LVEDP of 14 mmHg.  Recommendation: Medical therapy.  Findings Coronary Findings Diagnostic  Dominance: Right  Left Anterior Descending Vessel is large.  Left Circumflex Vessel is moderate in size.  Right Coronary Artery Vessel is large. Non-stenotic Mid RCA lesion was previously treated. The lesion is thrombotic.  Intervention  No interventions have been documented.   CARDIAC CATHETERIZATION  CARDIAC CATHETERIZATION 10/23/2022  Conclusion   Mid RCA lesion is 99% stenosed.   A drug-eluting stent was successfully placed using a SYNERGY XD 3.50X28.   Post intervention, there is a 0% residual stenosis.   There is mild left ventricular systolic dysfunction.   LV end diastolic pressure is severely elevated.   The left ventricular ejection fraction is 45-50% by visual estimate.  Acute inferior ST elevation MI secondary to thrombotic occlusion of the mid RCA Successful PTCA/DES x 1 mid RCA No disease in the LAD or Circumflex LVEDP=14mmHg  Recommendations: Will admit to the ICU. Aggrastat  infusion for two hours. DAPT with ASA and Brilinta  for one year. High intensity statin. Beta blocker as BP tolerates. Echo tomorrow. Lasix  40 mg IV given  as patient was leaving the cath lab.  Findings Coronary Findings Diagnostic  Dominance: Right  Left Anterior Descending Vessel is large.  Left Circumflex Vessel is moderate in  size.  Right Coronary Artery Vessel is large. Mid RCA lesion is 99% stenosed. The lesion is thrombotic.  Intervention  Mid RCA lesion Stent CATH VISTA GUIDE 6FR JR4 guide catheter was inserted. Lesion crossed with guidewire using a WIRE ASAHI PROWATER 180CM. Pre-stent angioplasty was performed using a BALLN EMERGE MR 2.0X12. A drug-eluting stent was successfully placed using a SYNERGY XD 3.50X28. Stent strut is well apposed. Post-stent angioplasty was performed using a BALL SAPPHIRE NC24 3.75X15. Post-Intervention Lesion Assessment The intervention was successful. Pre-interventional TIMI flow is 1. Post-intervention TIMI flow is 3. No complications occurred at this lesion. There is a 0% residual stenosis post intervention.   STRESS TESTS  NM PET CT CARDIAC PERFUSION MULTI W/ABSOLUTE BLOODFLOW 11/07/2023  Narrative   Findings are consistent with inferior and inferolateral infarction with inferolateral peri-infarct ischemia with abnormal associated regional stress flows. The study is high risk due to the amount of myocardium affected.   LV perfusion is abnormal. There is evidence of ischemia. There is evidence of infarction. Defect 1: There is a large defect with severe reduction in uptake present in the apical to basal inferior and inferolateral location(s) that is partially reversible. There is abnormal wall motion in the defect area. Consistent with infarction and peri-infarct ischemia.   Rest left ventricular function is normal. Rest EF: 60%. Stress left ventricular function is normal. Stress EF: 62%. End diastolic cavity size is normal. End systolic cavity size is normal.   Myocardial blood flow was computed to be 0.29ml/g/min at rest and 1.48ml/g/min at stress. Global myocardial blood flow reserve was 2.43 and was normal.   Coronary calcium  assessment not performed due to prior revascularization. Aortic atherosclerosis.   Electronically Signed  By: Stanly Leavens  M.D.  EXAM: OVER-READ INTERPRETATION CARDIAC CT CHEST  The following report is an over-read performed by radiologist Dr. Ryan Salvage of [facility] on 11/07/2023. This over-read does not include interpretation of cardiac or coronary anatomy or pathology. The cardiac and PET interpretation by the cardiologist is attached or will be attached.  COMPARISON:  Chest CT 02/02/2023  FINDINGS: Extracardiac Vascular: Aortic atherosclerosis.  Mediastinum: Unremarkable  Lung: Chronic scarring at the right lung base.  Included Upper Abdomen: Unremarkable  Musculoskeletal: Right mastectomy.  IMPRESSION: 1. Chronic scarring at the right lung base. 2. Right mastectomy. 3.  Aortic Atherosclerosis (ICD10-I70.0).   Electronically Signed By: Ryan Salvage M.D. On: 11/07/2023 12:40   ECHOCARDIOGRAM  ECHOCARDIOGRAM COMPLETE 10/24/2023  Narrative ECHOCARDIOGRAM REPORT    Patient Name:   GELSEY AMYX Date of Exam: 10/24/2023 Medical Rec #:  [id number]         Height:       62.0 in Accession #:    [id number]        Weight:       151.0 lb Date of Birth:  09-04-1960          BSA:          1.696 m Patient Age:    62 years          BP:           110/80 mmHg Patient Gender: F                 HR:           62 bpm. Exam  Location:  [address]  Procedure: 2D Echo, 3D Echo, Cardiac Doppler and Color Doppler (Both Spectral and Color Flow Doppler were utilized during procedure).  Indications:    I25.10 CAD  History:        Patient has prior history of Echocardiogram examinations, most recent 10/24/2022. CAD and Previous Myocardial Infarction, Mitral Valve Prolapse, Signs/Symptoms:Chest Pain and Shortness of Breath; Risk Factors:Family History of Coronary Artery Disease, Hypertension, Dyslipidemia and Current Smoker. Right Breast Cancer- status post Mastectomy and Chemotherapy, Palpitations.  Sonographer:    Heather Hawks RDCS Referring Phys: GLENDIA DASEN  WEAVER  IMPRESSIONS   1. Left ventricular ejection fraction, by estimation, is 60 to 65%. Left ventricular ejection fraction by 3D volume is 65 %. The left ventricle has normal function. The left ventricle has no regional wall motion abnormalities. Left ventricular diastolic parameters were normal. 2. Right ventricular systolic function is normal. The right ventricular size is normal. Mildly increased right ventricular wall thickness. There is normal pulmonary artery systolic pressure. The estimated right ventricular systolic pressure is 31.1 mmHg. 3. The mitral valve is normal in structure. Mild mitral valve regurgitation. No evidence of mitral stenosis. 4. The aortic valve is tricuspid. Aortic valve regurgitation is not visualized. No aortic stenosis is present. 5. The inferior vena cava is normal in size with greater than 50% respiratory variability, suggesting right atrial pressure of 3 mmHg.  FINDINGS Left Ventricle: Left ventricular ejection fraction, by estimation, is 60 to 65%. Left ventricular ejection fraction by 3D volume is 65 %. The left ventricle has normal function. The left ventricle has no regional wall motion abnormalities. The left ventricular internal cavity size was normal in size. There is no left ventricular hypertrophy. Left ventricular diastolic parameters were normal.  Right Ventricle: The right ventricular size is normal. Mildly increased right ventricular wall thickness. Right ventricular systolic function is normal. There is normal pulmonary artery systolic pressure. The tricuspid regurgitant velocity is 2.65 m/s, and with an assumed right atrial pressure of 3 mmHg, the estimated right ventricular systolic pressure is 31.1 mmHg.  Left Atrium: Left atrial size was normal in size.  Right Atrium: Right atrial size was normal in size.  Pericardium: There is no evidence of pericardial effusion.  Mitral Valve: The mitral valve is normal in structure. Mild mitral valve  regurgitation. No evidence of mitral valve stenosis.  Tricuspid Valve: The tricuspid valve is normal in structure. Tricuspid valve regurgitation is mild . No evidence of tricuspid stenosis.  Aortic Valve: The aortic valve is tricuspid. Aortic valve regurgitation is not visualized. No aortic stenosis is present.  Pulmonic Valve: The pulmonic valve was normal in structure. Pulmonic valve regurgitation is not visualized. No evidence of pulmonic stenosis.  Aorta: The aortic root and ascending aorta are structurally normal, with no evidence of dilitation.  Venous: The inferior vena cava is normal in size with greater than 50% respiratory variability, suggesting right atrial pressure of 3 mmHg.  IAS/Shunts: No atrial level shunt detected by color flow Doppler.  Additional Comments: 3D was performed not requiring image post processing on an independent workstation and was normal.   LEFT VENTRICLE PLAX 2D LVIDd:         4.80 cm         Diastology LVIDs:         3.10 cm         LV e' medial:    6.85 cm/s LV PW:         0.60 cm  LV E/e' medial:  10.0 LV IVS:        1.00 cm         LV e' lateral:   11.40 cm/s LVOT diam:     1.80 cm         LV E/e' lateral: 6.0 LV SV:         57 LV SV Index:   34 LVOT Area:     2.54 cm        3D Volume EF LV 3D EF:    Left ventricul ar ejection fraction by 3D volume is 65 %.  3D Volume EF: 3D EF:        65 % LV EDV:       80 ml LV ESV:       28 ml LV SV:        52 ml  RIGHT VENTRICLE RV Basal diam:  2.70 cm RV S prime:     9.46 cm/s TAPSE (M-mode): 1.8 cm RVSP:           31.1 mmHg  LEFT ATRIUM             Index        RIGHT ATRIUM           Index LA diam:        3.30 cm 1.95 cm/m   RA Pressure: 3.00 mmHg LA Vol (A2C):   30.1 ml 17.74 ml/m  RA Area:     11.70 cm LA Vol (A4C):   47.5 ml 28.00 ml/m  RA Volume:   23.50 ml  13.85 ml/m LA Biplane Vol: 41.9 ml 24.70 ml/m AORTIC VALVE LVOT Vmax:   95.90 cm/s LVOT Vmean:  62.150  cm/s LVOT VTI:    0.224 m  AORTA Ao Root diam: 2.70 cm Ao Asc diam:  3.00 cm  MITRAL VALVE               TRICUSPID VALVE MV Area (PHT): cm         TR Peak grad:   28.1 mmHg MV Decel Time: 353 msec    TR Vmax:        265.00 cm/s MV E velocity: 68.23 cm/s  Estimated RAP:  3.00 mmHg MV A velocity: 74.83 cm/s  RVSP:           31.1 mmHg MV E/A ratio:  0.91 SHUNTS Systemic VTI:  0.22 m Systemic Diam: 1.80 cm  Sunit Tolia Electronically signed by Madonna Large Signature Date/Time: 10/24/2023/7:48:28 PM    Final    MONITORS  LONG TERM MONITOR (3-14 DAYS) 02/19/2020  Narrative The patient wore Zio patch monitor from January 28, 2020 through February 11, 2020.  The predominant rhythm was sinus rhythm with an average rate at 81 bpm.  The slowest heart rate was sinus bradycardia at 54 bpm.  He fastest heart rate was a 6 beat burst of SVT at 176 bpm.  There were total of 21 short-lived episodes of supraventricular tachycardia with the fastest lasting 6 beats at a maximum rate of 176 bpm and the longest lasting 15 beats with an average rate at 103 bpm.  A few short episodes may have been atrial tachycardia.  There were rare PACs, atrial couplets and triplets all less than 1%.  There were rare isolated PVCs at less than 1% without couplets or triplets.  There were no episodes of atrial fibrillation.  There were no sinus pauses or heart block.   CT SCANS  CT CARDIAC  SCORING (SELF PAY ONLY) 06/14/2022  Addendum 06/17/2022  8:59 PM ADDENDUM REPORT: 06/17/2022 20:56  EXAM: OVER-READ INTERPRETATION  CT CHEST  The following report is an over-read performed by radiologist Dr. Greig Ferraris [facility] on 06/17/2022. This over-read does not include interpretation of cardiac or coronary anatomy or pathology. The coronary calcium  score interpretation by the cardiologist is attached.  COMPARISON:  Chest CT 01/28/2022  FINDINGS: No enlarged lymph nodes are seen in the mediastinum. The  visualized lungs are clear. Visualized upper abdomen is within normal limits. Right mastectomy changes are present. No significant osseous abnormality.  IMPRESSION: No significant extracardiac findings.   Electronically Signed By: Greig Pique M.D. On: 06/17/2022 20:56  Narrative CLINICAL DATA:  Cardiovascular disease risk stratification  CAD screening, intermediate CAD risk, treadmill candidate  EXAM: CT Coronary Calcium  Score  TECHNIQUE: A gated, non-contrast computed tomography scan of the heart was performed using 3mm slice thickness. Axial images were analyzed on a dedicated workstation. Calcium  scoring of the coronary arteries was performed using the Agatston method.  FINDINGS: Coronary Calcium  Score:  Left main: 0  Left anterior descending artery: 0  Left circumflex artery: 0  Right coronary artery: 1  Total: 1  Percentile: 59th  Pericardium: Normal.  Ascending Aorta: Normal caliber. Ascending aorta measures approximately 30mm at the mid ascending aorta measured in a non-contrast axial plane.  Aortic atherosclerosis.  Non-cardiac: See separate report from [facility].  IMPRESSION: Coronary calcium  score of 1. This was 59th percentile for age-, race-, and sex-matched controls.  RECOMMENDATIONS: Coronary artery calcium  (CAC) score is a strong predictor of incident coronary heart disease (CHD) and provides predictive information beyond traditional risk factors. CAC scoring is reasonable to use in the decision to withhold, postpone, or initiate statin therapy in intermediate-risk or selected borderline-risk asymptomatic adults (age 91-75 years and LDL-C >=70 to <190 mg/dL) who do not have diabetes or established atherosclerotic cardiovascular disease (ASCVD).* In intermediate-risk (10-year ASCVD risk >=7.5% to <20%) adults or selected borderline-risk (10-year ASCVD risk >=5% to <7.5%) adults in whom a CAC score is measured for the  purpose of making a treatment decision the following recommendations have been made:  If CAC=0, it is reasonable to withhold statin therapy and reassess in 5 to 10 years, as long as higher risk conditions are absent (diabetes mellitus, family history of premature CHD in first degree relatives (males <55 years; females <65 years), cigarette smoking, or LDL >=190 mg/dL).  If CAC is 1 to 99, it is reasonable to initiate statin therapy for patients >=6 years of age.  If CAC is >=100 or >=75th percentile, it is reasonable to initiate statin therapy at any age.  Cardiology referral should be considered for patients with CAC scores >=400 or >=75th percentile.  *2018 AHA/ACC/AACVPR/AAPA/ABC/ACPM/ADA/AGS/APhA/ASPC/NLA/PCNA Guideline on the Management of Blood Cholesterol: A Report of the American College of Cardiology/American Heart Association Task Force on Clinical Practice Guidelines. J Am Coll Cardiol. 2019;73(24):3168-3209.  Electronically Signed: By: Soyla Merck M.D. On: 06/15/2022 10:55     ______________________________________________________________________________________________        Physical Exam:    VS:  BP 123/66 (BP Location: Left Arm)   Pulse 75   Ht 5' 2 (1.575 m)   Wt 151 lb (68.5 kg)   LMP 11/03/1993   SpO2 93%   BMI 27.62 kg/m    Wt Readings from Last 3 Encounters:  11/13/23 151 lb (68.5 kg)  10/16/23 151 lb (68.5 kg)  03/08/23 146 lb (66.2 kg)  Gen: no distress,  Neck: No JVD Cardiac: No Rubs or Gallops, no Murmur, RRR +2 radial pulses Respiratory: Clear to auscultation bilaterally, normal effort, normal  respiratory rate GI: Soft, nontender, non-distended  MS: No  edema;  moves all extremities Integument: Skin feels warm Neuro:  At time of evaluation, alert and oriented to person/place/time/situation  Psych: Normal affect, patient feels ok   ASSESSMENT AND PLAN: .    Coronary artery disease with history of myocardial  infarction History of right coronary artery infarct with residual scar and peri-infarct ischemia. Previous myocardial perfusion imaging showed evidence of infarct with peri-infarct ischemia. High-risk stress test due to significant infarcted area. Potential microvascular disease or peri-infarct ischemia around RCA, but no epicardial blockage identified. Limited medication options due to allergies and intolerances. - Set up for heart catheterization with Dr. Verlin to assess for any fixable issues. - Continue medical management as feasible given medication intolerances (she can tolerate the [employer] Ingenus Imdur ; though this is difficult to find for the patient) - I am concerned she may not have a clear target for her residual RCA mild peri-infarct ischemia; no evidence of microvascular disease with normal blood flow reserve  Chest pain (angina) Intermittent chest pain potentially exacerbated by medication interactions. Previous episodes possibly related to esophageal spasms or peri-infarct ischemia. Limited medication options due to allergies and intolerances. - Set up for heart catheterization to assess for any fixable issues contributing to chest pain. - Refill isosorbide  mononitrate 30 mg with specific manufacturer Ingenus to manage angina.  Medication allergies and intolerance Allergies to multiple medications, including statins, Zetia, PCSK9 inhibitors, and certain binders in generic drugs. Intolerance to ranolazine due to ticagrelor  interaction. Difficulty obtaining isosorbide  mononitrate from a specific manufacturer due to allergies to synthetic binders. Ingenus brand identified as tolerable.  Should f/u with her allergist  Smoking history Long history of smoking, which may contribute to cardiovascular issues. No evidence of microvascular disease on recent tests despite smoking history. - Encourage smoking cessation as a beneficial lifestyle change.  Post cath f/u with Dr. Santa  team  Stanly Leavens, MD FASE [facility] Cardiologist [facility]  [facility]  [address] [place], [address] [place], [place] [address] [phone]  1:59 PM

## 2023-11-13 NOTE — Progress Notes (Signed)
 Cardiology Office Note:  .    Date:  11/13/2023  ID:  Wendy Lopez, DOB 1960/06/21, MRN [id number] PCP: Ransom Other, MD  [facility] Providers Cardiologist:  Lonni Cash, MD Cardiology APP:  Lelon Glendia DASEN, PA-C     CC: F/u stress test  History of Present Illness: .    Wendy Lopez is a 63 y.o. female with complex CAD.  She has a history of coronary artery disease with a prior right coronary artery infarct and T wave abnormalities. Myocardial perfusion imaging in the past showed evidence of an infarct, as the patient recalls being told by her doctors. She experiences chest pressure, particularly during exercise, and reports that her symptoms seemed to improve after she stopped taking certain medications.  She has a complex medication history with intolerance to multiple medications including statins, Zetia, and PCSK9 inhibitors. She cannot take ranolazine due to its interaction with ticagrelor . She reports an allergy to a synthetic binder found in many generic and some brand name medications, necessitating the use of EpiPens. She is currently unable to find a specific manufacturer of isosorbide  mononitrate (NFG INGENUS) that she can tolerate due to a potential shortage.  She describes a significant episode prior to a previous Sunday where she felt as though 'somebody was ripping my esophagus out,' which took her breath away but was not accompanied by pain.  She has a family history of heart disease, with many relatives having had heart attacks, and she has performed CPR on a cousin before. Despite her family history, she did not have a heart attack until later in life.  She has a history of smoking, which she describes as her 'favorite' habit, and acknowledges the potential impact on her health. She has undergone multiple stress tests and heart catheterizations in the past, and she reports feeling poorly after stress tests, with symptoms of pressure during  exercise.  Discussed the use of AI scribe software for clinical note transcription with the patient, who gave verbal consent to proceed.   Relevant histories: .  Social  - CM patient; strong history  ROS: As per HPI.   Studies Reviewed: .     Cardiac Studies & Procedures   ______________________________________________________________________________________________ CARDIAC CATHETERIZATION  CARDIAC CATHETERIZATION 10/26/2022  Conclusion   Non-stenotic Mid RCA lesion was previously treated.  1.  Widely patent right coronary artery stent. 2.  LVEDP of 14 mmHg.  Recommendation: Medical therapy.  Findings Coronary Findings Diagnostic  Dominance: Right  Left Anterior Descending Vessel is large.  Left Circumflex Vessel is moderate in size.  Right Coronary Artery Vessel is large. Non-stenotic Mid RCA lesion was previously treated. The lesion is thrombotic.  Intervention  No interventions have been documented.   CARDIAC CATHETERIZATION  CARDIAC CATHETERIZATION 10/23/2022  Conclusion   Mid RCA lesion is 99% stenosed.   A drug-eluting stent was successfully placed using a SYNERGY XD 3.50X28.   Post intervention, there is a 0% residual stenosis.   There is mild left ventricular systolic dysfunction.   LV end diastolic pressure is severely elevated.   The left ventricular ejection fraction is 45-50% by visual estimate.  Acute inferior ST elevation MI secondary to thrombotic occlusion of the mid RCA Successful PTCA/DES x 1 mid RCA No disease in the LAD or Circumflex LVEDP=14mmHg  Recommendations: Will admit to the ICU. Aggrastat  infusion for two hours. DAPT with ASA and Brilinta  for one year. High intensity statin. Beta blocker as BP tolerates. Echo tomorrow. Lasix  40 mg IV given  as patient was leaving the cath lab.  Findings Coronary Findings Diagnostic  Dominance: Right  Left Anterior Descending Vessel is large.  Left Circumflex Vessel is moderate in  size.  Right Coronary Artery Vessel is large. Mid RCA lesion is 99% stenosed. The lesion is thrombotic.  Intervention  Mid RCA lesion Stent CATH VISTA GUIDE 6FR JR4 guide catheter was inserted. Lesion crossed with guidewire using a WIRE ASAHI PROWATER 180CM. Pre-stent angioplasty was performed using a BALLN EMERGE MR 2.0X12. A drug-eluting stent was successfully placed using a SYNERGY XD 3.50X28. Stent strut is well apposed. Post-stent angioplasty was performed using a BALL SAPPHIRE NC24 3.75X15. Post-Intervention Lesion Assessment The intervention was successful. Pre-interventional TIMI flow is 1. Post-intervention TIMI flow is 3. No complications occurred at this lesion. There is a 0% residual stenosis post intervention.   STRESS TESTS  NM PET CT CARDIAC PERFUSION MULTI W/ABSOLUTE BLOODFLOW 11/07/2023  Narrative   Findings are consistent with inferior and inferolateral infarction with inferolateral peri-infarct ischemia with abnormal associated regional stress flows. The study is high risk due to the amount of myocardium affected.   LV perfusion is abnormal. There is evidence of ischemia. There is evidence of infarction. Defect 1: There is a large defect with severe reduction in uptake present in the apical to basal inferior and inferolateral location(s) that is partially reversible. There is abnormal wall motion in the defect area. Consistent with infarction and peri-infarct ischemia.   Rest left ventricular function is normal. Rest EF: 60%. Stress left ventricular function is normal. Stress EF: 62%. End diastolic cavity size is normal. End systolic cavity size is normal.   Myocardial blood flow was computed to be 0.29ml/g/min at rest and 1.48ml/g/min at stress. Global myocardial blood flow reserve was 2.43 and was normal.   Coronary calcium  assessment not performed due to prior revascularization. Aortic atherosclerosis.   Electronically Signed  By: Stanly Leavens  M.D.  EXAM: OVER-READ INTERPRETATION CARDIAC CT CHEST  The following report is an over-read performed by radiologist Dr. Ryan Salvage of [facility] on 11/07/2023. This over-read does not include interpretation of cardiac or coronary anatomy or pathology. The cardiac and PET interpretation by the cardiologist is attached or will be attached.  COMPARISON:  Chest CT 02/02/2023  FINDINGS: Extracardiac Vascular: Aortic atherosclerosis.  Mediastinum: Unremarkable  Lung: Chronic scarring at the right lung base.  Included Upper Abdomen: Unremarkable  Musculoskeletal: Right mastectomy.  IMPRESSION: 1. Chronic scarring at the right lung base. 2. Right mastectomy. 3.  Aortic Atherosclerosis (ICD10-I70.0).   Electronically Signed By: Ryan Salvage M.D. On: 11/07/2023 12:40   ECHOCARDIOGRAM  ECHOCARDIOGRAM COMPLETE 10/24/2023  Narrative ECHOCARDIOGRAM REPORT    Patient Name:   Wendy Lopez Date of Exam: 10/24/2023 Medical Rec #:  [id number]         Height:       62.0 in Accession #:    [id number]        Weight:       151.0 lb Date of Birth:  09-04-1960          BSA:          1.696 m Patient Age:    62 years          BP:           110/80 mmHg Patient Gender: F                 HR:           62 bpm. Exam  Location:  [address]  Procedure: 2D Echo, 3D Echo, Cardiac Doppler and Color Doppler (Both Spectral and Color Flow Doppler were utilized during procedure).  Indications:    I25.10 CAD  History:        Patient has prior history of Echocardiogram examinations, most recent 10/24/2022. CAD and Previous Myocardial Infarction, Mitral Valve Prolapse, Signs/Symptoms:Chest Pain and Shortness of Breath; Risk Factors:Family History of Coronary Artery Disease, Hypertension, Dyslipidemia and Current Smoker. Right Breast Cancer- status post Mastectomy and Chemotherapy, Palpitations.  Sonographer:    Heather Hawks RDCS Referring Phys: GLENDIA DASEN  WEAVER  IMPRESSIONS   1. Left ventricular ejection fraction, by estimation, is 60 to 65%. Left ventricular ejection fraction by 3D volume is 65 %. The left ventricle has normal function. The left ventricle has no regional wall motion abnormalities. Left ventricular diastolic parameters were normal. 2. Right ventricular systolic function is normal. The right ventricular size is normal. Mildly increased right ventricular wall thickness. There is normal pulmonary artery systolic pressure. The estimated right ventricular systolic pressure is 31.1 mmHg. 3. The mitral valve is normal in structure. Mild mitral valve regurgitation. No evidence of mitral stenosis. 4. The aortic valve is tricuspid. Aortic valve regurgitation is not visualized. No aortic stenosis is present. 5. The inferior vena cava is normal in size with greater than 50% respiratory variability, suggesting right atrial pressure of 3 mmHg.  FINDINGS Left Ventricle: Left ventricular ejection fraction, by estimation, is 60 to 65%. Left ventricular ejection fraction by 3D volume is 65 %. The left ventricle has normal function. The left ventricle has no regional wall motion abnormalities. The left ventricular internal cavity size was normal in size. There is no left ventricular hypertrophy. Left ventricular diastolic parameters were normal.  Right Ventricle: The right ventricular size is normal. Mildly increased right ventricular wall thickness. Right ventricular systolic function is normal. There is normal pulmonary artery systolic pressure. The tricuspid regurgitant velocity is 2.65 m/s, and with an assumed right atrial pressure of 3 mmHg, the estimated right ventricular systolic pressure is 31.1 mmHg.  Left Atrium: Left atrial size was normal in size.  Right Atrium: Right atrial size was normal in size.  Pericardium: There is no evidence of pericardial effusion.  Mitral Valve: The mitral valve is normal in structure. Mild mitral valve  regurgitation. No evidence of mitral valve stenosis.  Tricuspid Valve: The tricuspid valve is normal in structure. Tricuspid valve regurgitation is mild . No evidence of tricuspid stenosis.  Aortic Valve: The aortic valve is tricuspid. Aortic valve regurgitation is not visualized. No aortic stenosis is present.  Pulmonic Valve: The pulmonic valve was normal in structure. Pulmonic valve regurgitation is not visualized. No evidence of pulmonic stenosis.  Aorta: The aortic root and ascending aorta are structurally normal, with no evidence of dilitation.  Venous: The inferior vena cava is normal in size with greater than 50% respiratory variability, suggesting right atrial pressure of 3 mmHg.  IAS/Shunts: No atrial level shunt detected by color flow Doppler.  Additional Comments: 3D was performed not requiring image post processing on an independent workstation and was normal.   LEFT VENTRICLE PLAX 2D LVIDd:         4.80 cm         Diastology LVIDs:         3.10 cm         LV e' medial:    6.85 cm/s LV PW:         0.60 cm  LV E/e' medial:  10.0 LV IVS:        1.00 cm         LV e' lateral:   11.40 cm/s LVOT diam:     1.80 cm         LV E/e' lateral: 6.0 LV SV:         57 LV SV Index:   34 LVOT Area:     2.54 cm        3D Volume EF LV 3D EF:    Left ventricul ar ejection fraction by 3D volume is 65 %.  3D Volume EF: 3D EF:        65 % LV EDV:       80 ml LV ESV:       28 ml LV SV:        52 ml  RIGHT VENTRICLE RV Basal diam:  2.70 cm RV S prime:     9.46 cm/s TAPSE (M-mode): 1.8 cm RVSP:           31.1 mmHg  LEFT ATRIUM             Index        RIGHT ATRIUM           Index LA diam:        3.30 cm 1.95 cm/m   RA Pressure: 3.00 mmHg LA Vol (A2C):   30.1 ml 17.74 ml/m  RA Area:     11.70 cm LA Vol (A4C):   47.5 ml 28.00 ml/m  RA Volume:   23.50 ml  13.85 ml/m LA Biplane Vol: 41.9 ml 24.70 ml/m AORTIC VALVE LVOT Vmax:   95.90 cm/s LVOT Vmean:  62.150  cm/s LVOT VTI:    0.224 m  AORTA Ao Root diam: 2.70 cm Ao Asc diam:  3.00 cm  MITRAL VALVE               TRICUSPID VALVE MV Area (PHT): cm         TR Peak grad:   28.1 mmHg MV Decel Time: 353 msec    TR Vmax:        265.00 cm/s MV E velocity: 68.23 cm/s  Estimated RAP:  3.00 mmHg MV A velocity: 74.83 cm/s  RVSP:           31.1 mmHg MV E/A ratio:  0.91 SHUNTS Systemic VTI:  0.22 m Systemic Diam: 1.80 cm  Sunit Tolia Electronically signed by Madonna Large Signature Date/Time: 10/24/2023/7:48:28 PM    Final    MONITORS  LONG TERM MONITOR (3-14 DAYS) 02/19/2020  Narrative The patient wore Zio patch monitor from January 28, 2020 through February 11, 2020.  The predominant rhythm was sinus rhythm with an average rate at 81 bpm.  The slowest heart rate was sinus bradycardia at 54 bpm.  He fastest heart rate was a 6 beat burst of SVT at 176 bpm.  There were total of 21 short-lived episodes of supraventricular tachycardia with the fastest lasting 6 beats at a maximum rate of 176 bpm and the longest lasting 15 beats with an average rate at 103 bpm.  A few short episodes may have been atrial tachycardia.  There were rare PACs, atrial couplets and triplets all less than 1%.  There were rare isolated PVCs at less than 1% without couplets or triplets.  There were no episodes of atrial fibrillation.  There were no sinus pauses or heart block.   CT SCANS  CT CARDIAC  SCORING (SELF PAY ONLY) 06/14/2022  Addendum 06/17/2022  8:59 PM ADDENDUM REPORT: 06/17/2022 20:56  EXAM: OVER-READ INTERPRETATION  CT CHEST  The following report is an over-read performed by radiologist Dr. Greig Ferraris [facility] on 06/17/2022. This over-read does not include interpretation of cardiac or coronary anatomy or pathology. The coronary calcium  score interpretation by the cardiologist is attached.  COMPARISON:  Chest CT 01/28/2022  FINDINGS: No enlarged lymph nodes are seen in the mediastinum. The  visualized lungs are clear. Visualized upper abdomen is within normal limits. Right mastectomy changes are present. No significant osseous abnormality.  IMPRESSION: No significant extracardiac findings.   Electronically Signed By: Greig Pique M.D. On: 06/17/2022 20:56  Narrative CLINICAL DATA:  Cardiovascular disease risk stratification  CAD screening, intermediate CAD risk, treadmill candidate  EXAM: CT Coronary Calcium  Score  TECHNIQUE: A gated, non-contrast computed tomography scan of the heart was performed using 3mm slice thickness. Axial images were analyzed on a dedicated workstation. Calcium  scoring of the coronary arteries was performed using the Agatston method.  FINDINGS: Coronary Calcium  Score:  Left main: 0  Left anterior descending artery: 0  Left circumflex artery: 0  Right coronary artery: 1  Total: 1  Percentile: 59th  Pericardium: Normal.  Ascending Aorta: Normal caliber. Ascending aorta measures approximately 30mm at the mid ascending aorta measured in a non-contrast axial plane.  Aortic atherosclerosis.  Non-cardiac: See separate report from [facility].  IMPRESSION: Coronary calcium  score of 1. This was 59th percentile for age-, race-, and sex-matched controls.  RECOMMENDATIONS: Coronary artery calcium  (CAC) score is a strong predictor of incident coronary heart disease (CHD) and provides predictive information beyond traditional risk factors. CAC scoring is reasonable to use in the decision to withhold, postpone, or initiate statin therapy in intermediate-risk or selected borderline-risk asymptomatic adults (age 91-75 years and LDL-C >=70 to <190 mg/dL) who do not have diabetes or established atherosclerotic cardiovascular disease (ASCVD).* In intermediate-risk (10-year ASCVD risk >=7.5% to <20%) adults or selected borderline-risk (10-year ASCVD risk >=5% to <7.5%) adults in whom a CAC score is measured for the  purpose of making a treatment decision the following recommendations have been made:  If CAC=0, it is reasonable to withhold statin therapy and reassess in 5 to 10 years, as long as higher risk conditions are absent (diabetes mellitus, family history of premature CHD in first degree relatives (males <55 years; females <65 years), cigarette smoking, or LDL >=190 mg/dL).  If CAC is 1 to 99, it is reasonable to initiate statin therapy for patients >=6 years of age.  If CAC is >=100 or >=75th percentile, it is reasonable to initiate statin therapy at any age.  Cardiology referral should be considered for patients with CAC scores >=400 or >=75th percentile.  *2018 AHA/ACC/AACVPR/AAPA/ABC/ACPM/ADA/AGS/APhA/ASPC/NLA/PCNA Guideline on the Management of Blood Cholesterol: A Report of the American College of Cardiology/American Heart Association Task Force on Clinical Practice Guidelines. J Am Coll Cardiol. 2019;73(24):3168-3209.  Electronically Signed: By: Soyla Merck M.D. On: 06/15/2022 10:55     ______________________________________________________________________________________________        Physical Exam:    VS:  BP 123/66 (BP Location: Left Arm)   Pulse 75   Ht 5' 2 (1.575 m)   Wt 151 lb (68.5 kg)   LMP 11/03/1993   SpO2 93%   BMI 27.62 kg/m    Wt Readings from Last 3 Encounters:  11/13/23 151 lb (68.5 kg)  10/16/23 151 lb (68.5 kg)  03/08/23 146 lb (66.2 kg)  Gen: no distress,  Neck: No JVD Cardiac: No Rubs or Gallops, no Murmur, RRR +2 radial pulses Respiratory: Clear to auscultation bilaterally, normal effort, normal  respiratory rate GI: Soft, nontender, non-distended  MS: No  edema;  moves all extremities Integument: Skin feels warm Neuro:  At time of evaluation, alert and oriented to person/place/time/situation  Psych: Normal affect, patient feels ok   ASSESSMENT AND PLAN: .    Coronary artery disease with history of myocardial  infarction History of right coronary artery infarct with residual scar and peri-infarct ischemia. Previous myocardial perfusion imaging showed evidence of infarct with peri-infarct ischemia. High-risk stress test due to significant infarcted area. Potential microvascular disease or peri-infarct ischemia around RCA, but no epicardial blockage identified. Limited medication options due to allergies and intolerances. - Set up for heart catheterization with Dr. Verlin to assess for any fixable issues. - Continue medical management as feasible given medication intolerances (she can tolerate the MFG Ingenus Imdur ; though this is difficult to find for the patient) - I am concerned she may not have a clear target for her residual RCA mild peri-infarct ischemia; no evidence of microvascular disease with normal blood flow reserve  Chest pain (angina) Intermittent chest pain potentially exacerbated by medication interactions. Previous episodes possibly related to esophageal spasms or peri-infarct ischemia. Limited medication options due to allergies and intolerances. - Set up for heart catheterization to assess for any fixable issues contributing to chest pain. - Refill isosorbide  mononitrate 30 mg with specific manufacturer Ingenus to manage angina.  Medication allergies and intolerance Allergies to multiple medications, including statins, Zetia, PCSK9 inhibitors, and certain binders in generic drugs. Intolerance to ranolazine due to ticagrelor  interaction. Difficulty obtaining isosorbide  mononitrate from a specific manufacturer due to allergies to synthetic binders. Ingenus brand identified as tolerable.  Should f/u with her allergist  Smoking history Long history of smoking, which may contribute to cardiovascular issues. No evidence of microvascular disease on recent tests despite smoking history. - Encourage smoking cessation as a beneficial lifestyle change.  Post cath f/u with Dr. Santa  team  Stanly Leavens, MD FASE [facility] Cardiologist [facility]  [facility]  [address] [place], [address] [place], [place] [address] [phone]  1:59 PM

## 2023-11-13 NOTE — Patient Instructions (Signed)
 Medication Instructions:  Your physician recommends that you continue on your current medications as directed. Please refer to the Current Medication list given to you today.  *If you need a refill on your cardiac medications before your next appointment, please call your pharmacy*  Lab Work: TODAY at [facility]: BMP, CBC  If you have labs (blood work) drawn today and your tests are completely normal, you will receive your results only by: MyChart Message (if you have MyChart) OR A paper copy in the mail If you have any lab test that is abnormal or we need to change your treatment, we will call you to review the results.  Testing/Procedures: Your physician has requested that you have a cardiac catheterization. Cardiac catheterization is used to diagnose and/or treat various heart conditions. Doctors may recommend this procedure for a number of different reasons. The most common reason is to evaluate chest pain. Chest pain can be a symptom of coronary artery disease (CAD), and cardiac catheterization can show whether plaque is narrowing or blocking your heart's arteries. This procedure is also used to evaluate the valves, as well as measure the blood flow and oxygen levels in different parts of your heart. For further information please visit [email or web address] Please follow instruction sheet, as given.   Follow-Up: At [facility], you and your health needs are our priority.  As part of our continuing mission to provide you with exceptional heart care, our providers are all part of one team.  This team includes your primary Cardiologist (physician) and Advanced Practice Providers or APPs (Physician Assistants and Nurse Practitioners) who all work together to provide you with the care you need, when you need it.  Your next appointment:   6-8 week(s)  Provider:   Lonni Cash, MD or One of our Advanced Practice Providers (APPs): Morse Clause, PA-C  Lamarr Satterfield, NP Miriam Shams, NP  Olivia Pavy, PA-C Josefa Beauvais, NP  Leontine Salen, PA-C Orren Fabry, PA-C  Huckabay, PA-C Ernest Dick, NP  Damien Braver, NP Jon Hails, PA-C  Waddell Donath, PA-C    Dayna Dunn, PA-C  Scott Weaver, PA-C Lum Louis, NP Katlyn West, NP Callie Goodrich, PA-C  Evan Williams, PA-C Sheng Haley, PA-C  Xika Zhao, NP Kathleen Johnson, PA-C    Other Instructions   You are scheduled for a Cardiac Catheterization on Friday, August 1 with Dr. Lonni Cash.  1. Please arrive at the [facility] (Main Entrance A) at [facility]: [address] [place], [place] [address] at 10:00 AM (This time is 2 hour(s) before your procedure to ensure your preparation).   Free valet parking service is available. You will check in at ADMITTING. The support person will be asked to wait in the waiting room.  It is OK to have someone drop you off and come back when you are ready to be discharged.    Special note: Every effort is made to have your procedure done on time. Please understand that emergencies sometimes delay scheduled procedures.  2. Diet: Do not eat solid foods after midnight.  The patient may have clear liquids until 5am upon the day of the procedure.  3. Labs: You will need to have blood drawn TODAY at [facility].  4. Medication instructions in preparation for your procedure:   Contrast Allergy: No    On the morning of your procedure, take your Brilinta /Ticagrelor  and any morning medicines NOT listed above.  You may use sips of water.  5. Plan to  go home the same day, you will only stay overnight if medically necessary. 6. Bring a current list of your medications and current insurance cards. 7. You MUST have a responsible person to drive you home. 8. Someone MUST be with you the first 24 hours after you arrive home or your discharge will be delayed. 9. Please wear clothes that are easy to get on and off and wear slip-on shoes.  Thank you for allowing us  to  care for you!   -- [facility]

## 2023-11-14 LAB — CBC
Hematocrit: 49.7 % — ABNORMAL HIGH (ref 34.0–46.6)
Hemoglobin: 16.3 g/dL — ABNORMAL HIGH (ref 11.1–15.9)
MCH: 30.4 pg (ref 26.6–33.0)
MCHC: 32.8 g/dL (ref 31.5–35.7)
MCV: 93 fL (ref 79–97)
Platelets: 183 x10E3/uL (ref 150–450)
RBC: 5.37 x10E6/uL — ABNORMAL HIGH (ref 3.77–5.28)
RDW: 13.2 % (ref 11.7–15.4)
WBC: 9.4 x10E3/uL (ref 3.4–10.8)

## 2023-11-14 LAB — BASIC METABOLIC PANEL WITH GFR
BUN/Creatinine Ratio: 10 — AB (ref 12–28)
BUN: 9 mg/dL (ref 8–27)
CO2: 20 mmol/L (ref 20–29)
Calcium: 9.4 mg/dL (ref 8.7–10.3)
Chloride: 103 mmol/L (ref 96–106)
Creatinine, Ser: 0.91 mg/dL (ref 0.57–1.00)
Glucose: 79 mg/dL (ref 70–99)
Potassium: 4.7 mmol/L (ref 3.5–5.2)
Sodium: 143 mmol/L (ref 134–144)
eGFR: 71 mL/min/1.73 (ref >59)

## 2023-11-15 ENCOUNTER — Ambulatory Visit: Payer: Self-pay

## 2023-11-16 ENCOUNTER — Telehealth: Payer: Self-pay | Admitting: *Deleted

## 2023-11-16 NOTE — Telephone Encounter (Addendum)
 Cardiac Catheterization scheduled at [facility] for: Friday November 17, 2023 12 Noon Arrival time [facility] Main Entrance A at: 10 AM  Diet: -May have light meal until 6 AM. (6 hours before procedure time) Approved light meal consists of plain toast, fruit, light soups, crackers.  Hydration: -May drink clear liquids until leaving for hospital. Approved liquids: Water, clear juice, clear tea, black coffee, fruit juices-non-citric and without pulp, carbonated beverages, Gatorade.  Drink 16 oz bottle of water on the way to the hospital.  Medication instructions: -Usual morning medications can be taken including aspirin  81 mg and Brilinta  90 mg  Plan to go home the same day, you will only stay overnight if medically necessary.  You must have responsible adult to drive you home.  Someone must be with you the first 24 hours after you arrive home.  Reviewed procedure instructions with patient.

## 2023-11-17 ENCOUNTER — Encounter (HOSPITAL_COMMUNITY): Payer: Self-pay | Admitting: Cardiovascular Disease

## 2023-11-17 ENCOUNTER — Encounter (HOSPITAL_COMMUNITY)
Admission: RE | Disposition: A | Discharge status: Home / Self Care | Payer: Self-pay | Source: Home / Self Care | Attending: Cardiovascular Disease

## 2023-11-17 ENCOUNTER — Other Ambulatory Visit: Payer: Self-pay

## 2023-11-17 ENCOUNTER — Ambulatory Visit (HOSPITAL_COMMUNITY)
Admission: RE | Admit: 2023-11-17 | Discharge: 2023-11-18 | Disposition: A | Discharge status: Home / Self Care | Attending: Cardiovascular Disease | Admitting: Cardiovascular Disease

## 2023-11-17 DIAGNOSIS — Z87891 Personal history of nicotine dependence: Secondary | ICD-10-CM | POA: Diagnosis not present

## 2023-11-17 DIAGNOSIS — E785 Hyperlipidemia, unspecified: Secondary | ICD-10-CM | POA: Insufficient documentation

## 2023-11-17 DIAGNOSIS — I252 Old myocardial infarction: Secondary | ICD-10-CM | POA: Diagnosis not present

## 2023-11-17 DIAGNOSIS — Z955 Presence of coronary angioplasty implant and graft: Secondary | ICD-10-CM | POA: Diagnosis not present

## 2023-11-17 DIAGNOSIS — I200 Unstable angina: Secondary | ICD-10-CM | POA: Diagnosis present

## 2023-11-17 DIAGNOSIS — Z888 Allergy status to other drugs, medicaments and biological substances status: Secondary | ICD-10-CM | POA: Diagnosis not present

## 2023-11-17 DIAGNOSIS — Z7982 Long term (current) use of aspirin: Secondary | ICD-10-CM | POA: Diagnosis not present

## 2023-11-17 DIAGNOSIS — I25110 Atherosclerotic heart disease of native coronary artery with unstable angina pectoris: Secondary | ICD-10-CM

## 2023-11-17 DIAGNOSIS — Z7902 Long term (current) use of antithrombotics/antiplatelets: Secondary | ICD-10-CM | POA: Diagnosis not present

## 2023-11-17 DIAGNOSIS — Z8249 Family history of ischemic heart disease and other diseases of the circulatory system: Secondary | ICD-10-CM | POA: Diagnosis not present

## 2023-11-17 DIAGNOSIS — Z79899 Other long term (current) drug therapy: Secondary | ICD-10-CM | POA: Insufficient documentation

## 2023-11-17 DIAGNOSIS — Z9861 Coronary angioplasty status: Secondary | ICD-10-CM

## 2023-11-17 HISTORY — PX: CORONARY STENT INTERVENTION: CATH118234

## 2023-11-17 HISTORY — PX: LEFT HEART CATH AND CORONARY ANGIOGRAPHY: CATH118249

## 2023-11-17 LAB — POCT ACTIVATED CLOTTING TIME
Activated Clotting Time: 326 s
Activated Clotting Time: 394 s
Activated Clotting Time: 291 s
Activated Clotting Time: 239 s
Activated Clotting Time: 193 s

## 2023-11-17 SURGERY — LEFT HEART CATH AND CORONARY ANGIOGRAPHY
Anesthesia: LOCAL

## 2023-11-17 MED ORDER — HEPARIN (PORCINE) IN NACL 1000-0.9 UT/500ML-% IV SOLN
INTRAVENOUS | Status: DC | PRN
  Administered 2023-11-17 (×2): 500 mL

## 2023-11-17 MED ORDER — SODIUM CHLORIDE 0.9% FLUSH: 3.0000 mL | INTRAVENOUS | Status: DC | PRN

## 2023-11-17 MED ORDER — ONDANSETRON HCL 4 MG/2ML IJ SOLN: 4.0000 mg | Freq: Four times a day (QID) | INTRAMUSCULAR | Status: DC | PRN

## 2023-11-17 MED ORDER — FENTANYL CITRATE (PF) 100 MCG/2ML IJ SOLN
INTRAMUSCULAR | Status: AC
Start: 2023-11-17 — End: 2023-11-17
  Filled 2023-11-17: qty 2

## 2023-11-17 MED ORDER — NITROGLYCERIN 0.4 MG SL SUBL: 0.4000 mg | SUBLINGUAL_TABLET | SUBLINGUAL | Status: DC | PRN

## 2023-11-17 MED ORDER — SODIUM CHLORIDE 0.9% FLUSH: 3.0000 mL | Freq: Two times a day (BID) | INTRAVENOUS | Status: DC

## 2023-11-17 MED ORDER — SODIUM CHLORIDE 0.9 % IV SOLN: 250.0000 mL | INTRAVENOUS | Status: DC | PRN

## 2023-11-17 MED ORDER — VERAPAMIL HCL 2.5 MG/ML IV SOLN
INTRAVENOUS | Status: DC | PRN
  Administered 2023-11-17: 10 mL via INTRA_ARTERIAL

## 2023-11-17 MED ORDER — MIDAZOLAM HCL 2 MG/2ML IJ SOLN
INTRAMUSCULAR | Status: AC
  Filled 2023-11-17: qty 2

## 2023-11-17 MED ORDER — HYDRALAZINE HCL 20 MG/ML IJ SOLN
10.0000 mg | INTRAMUSCULAR | Status: AC | PRN
  Administered 2023-11-17: 10 mg via INTRAVENOUS

## 2023-11-17 MED ORDER — LIDOCAINE HCL (PF) 1 % IJ SOLN
INTRAMUSCULAR | Status: DC | PRN
  Administered 2023-11-17: 2 mL
  Administered 2023-11-17: 10 mL

## 2023-11-17 MED ORDER — ISOSORBIDE MONONITRATE ER 30 MG PO TB24: 30.0000 mg | ORAL_TABLET | Freq: Every day | ORAL | Status: DC

## 2023-11-17 MED ORDER — FREE WATER: 500.0000 mL | Freq: Once | Status: DC

## 2023-11-17 MED ORDER — LEVETIRACETAM 750 MG PO TABS
750.0000 mg | ORAL_TABLET | Freq: Two times a day (BID) | ORAL | Status: DC
  Filled 2023-11-17: qty 1

## 2023-11-17 MED ORDER — METOPROLOL SUCCINATE ER 25 MG PO TB24
100.0000 mg | ORAL_TABLET | Freq: Every day | ORAL | Status: DC
  Administered 2023-11-18: 100 mg via ORAL
  Filled 2023-11-17: qty 4

## 2023-11-17 MED ORDER — FENTANYL CITRATE (PF) 100 MCG/2ML IJ SOLN
INTRAMUSCULAR | Status: DC | PRN
  Administered 2023-11-17 (×3): 25 ug via INTRAVENOUS

## 2023-11-17 MED ORDER — TICAGRELOR 90 MG PO TABS: 90.0000 mg | ORAL_TABLET | Freq: Once | ORAL | Status: DC

## 2023-11-17 MED ORDER — MIDAZOLAM HCL 2 MG/2ML IJ SOLN
INTRAMUSCULAR | Status: DC | PRN
  Administered 2023-11-17 (×2): 1 mg via INTRAVENOUS

## 2023-11-17 MED ORDER — HEPARIN SODIUM (PORCINE) 1000 UNIT/ML IJ SOLN
INTRAMUSCULAR | Status: DC | PRN
  Administered 2023-11-17: 6000 [IU] via INTRAVENOUS

## 2023-11-17 MED ORDER — IOHEXOL 350 MG/ML SOLN
INTRAVENOUS | Status: DC | PRN
  Administered 2023-11-17: 50 mL

## 2023-11-17 MED ORDER — HEPARIN SODIUM (PORCINE) 1000 UNIT/ML IJ SOLN
INTRAMUSCULAR | Status: AC
  Filled 2023-11-17: qty 10

## 2023-11-17 MED ORDER — LEVETIRACETAM 750 MG PO TABS
750.0000 mg | ORAL_TABLET | Freq: Two times a day (BID) | ORAL | Status: DC
  Administered 2023-11-18 (×2): 750 mg via ORAL
  Filled 2023-11-17 (×2): qty 1

## 2023-11-17 MED ORDER — TICAGRELOR 90 MG PO TABS
90.0000 mg | ORAL_TABLET | Freq: Two times a day (BID) | ORAL | Status: DC
  Administered 2023-11-18 (×2): 90 mg via ORAL
  Filled 2023-11-17 (×2): qty 1

## 2023-11-17 MED ORDER — ASPIRIN 81 MG PO TBEC
81.0000 mg | DELAYED_RELEASE_TABLET | Freq: Every day | ORAL | Status: DC
  Administered 2023-11-18: 81 mg via ORAL
  Filled 2023-11-17: qty 1

## 2023-11-17 MED ORDER — SODIUM CHLORIDE 0.9% FLUSH
3.0000 mL | Freq: Two times a day (BID) | INTRAVENOUS | Status: DC
  Administered 2023-11-17 – 2023-11-18 (×2): 3 mL via INTRAVENOUS

## 2023-11-17 MED ORDER — ASPIRIN 81 MG PO CHEW: 81.0000 mg | CHEWABLE_TABLET | ORAL | Status: DC

## 2023-11-17 MED ORDER — LABETALOL HCL 5 MG/ML IV SOLN: 10.0000 mg | INTRAVENOUS | Status: AC | PRN

## 2023-11-17 MED ORDER — ISOSORBIDE MONONITRATE ER 30 MG PO TB24
30.0000 mg | ORAL_TABLET | Freq: Every day | ORAL | Status: DC
  Filled 2023-11-17: qty 1

## 2023-11-17 MED ORDER — LIDOCAINE HCL (PF) 1 % IJ SOLN
INTRAMUSCULAR | Status: AC
  Filled 2023-11-17: qty 30

## 2023-11-17 MED ORDER — HYDRALAZINE HCL 20 MG/ML IJ SOLN
INTRAMUSCULAR | Status: AC
  Filled 2023-11-17: qty 1

## 2023-11-17 SURGICAL SUPPLY — 16 items
BALLOON EMERGE MR 2.0X12 (BALLOONS) IMPLANT
BALLOON ~~LOC~~ EMERGE MR 3.75X15 (BALLOONS) IMPLANT
CATH 5FR JL3.5 JR4 ANG PIG MP (CATHETERS) IMPLANT
CATH VISTA GUIDE 6FR JR4 ECOPK (CATHETERS) IMPLANT
DEVICE RAD COMP TR BAND LRG (VASCULAR PRODUCTS) IMPLANT
GLIDESHEATH SLEND SS 6F .021 (SHEATH) IMPLANT
GUIDEWIRE INQWIRE 1.5J.035X260 (WIRE) IMPLANT
KIT ENCORE 26 ADVANTAGE (KITS) IMPLANT
KIT MICROPUNCTURE NIT STIFF (SHEATH) IMPLANT
PACK CARDIAC CATHETERIZATION (CUSTOM PROCEDURE TRAY) ×2 IMPLANT
SET ATX-X65L (MISCELLANEOUS) IMPLANT
SHEATH PINNACLE 6F 10CM (SHEATH) IMPLANT
SHEATH PROBE COVER 6X72 (BAG) IMPLANT
STENT SYNERGY XD 3.50X20 (Permanent Stent) IMPLANT
TUBING CIL FLEX 10 FLL-RA (TUBING) IMPLANT
WIRE ASAHI PROWATER 180CM (WIRE) IMPLANT

## 2023-11-17 NOTE — Progress Notes (Signed)
 Site area: 44F right femoral arterial sheath Site Prior to Removal:  Level 0 Pressure Applied For: 30 minutes Manual:   yes Patient Status During Pull:  stable Post Pull Site:  Level 0 Post Pull Instructions Given:  yes Post Pull Pulses Present: right dp palpable 2+ Dressing Applied:  gauze and tegaderm Bedrest begins @ 2040 Comments:

## 2023-11-17 NOTE — Progress Notes (Addendum)
 Report and care received from Cath lab RN. Pt is a&o 4. Pt placed on Tele box 25. Family at bedside, Pt offered PO intake. Dinner tray at bedside and Pt requesting water . BP 151/84 71 HR SpO2 96% on RA. Pt has home meds to go down to pharmacy. Pt bedrest started at 2030 tonight. 0 ml of air in R radial TR band. No bleeding noted. R groin access site with gauze without any bleeding noted.   Pt's Leveriracetam 750 mg and Isosorbide  30 mg taken to pharmacy with completed paperwork.  Bedrest ended for PT and PT assisted to bathroom with 1 assist standby. Pt tolerated ambulating.

## 2023-11-17 NOTE — Progress Notes (Signed)
 ACT 193. Dr. Verlin made aware. Permission given to pull arterial sheath now.

## 2023-11-17 NOTE — Interval H&P Note (Signed)
 History and Physical Interval Note:  11/17/2023 12:59 PM  Wendy Lopez  has presented today for surgery, with the diagnosis of abnormal stress test.  The various methods of treatment have been discussed with the patient and family. After consideration of risks, benefits and other options for treatment, the patient has consented to  Procedure(s): LEFT HEART CATH AND CORONARY ANGIOGRAPHY (N/A) as a surgical intervention.  The patient's history has been reviewed, patient examined, no change in status, stable for surgery.  I have reviewed the patient's chart and labs.  Questions were answered to the patient's satisfaction.    Cath Lab Visit (complete for each Cath Lab visit)  Clinical Evaluation Leading to the Procedure:   ACS: No.  Non-ACS:    Anginal Classification: CCS III  Anti-ischemic medical therapy: Maximal Therapy (2 or more classes of medications)  Non-Invasive Test Results: High risk stress PET CT  Prior CABG: No previous CABG        Lonni Cash

## 2023-11-18 ENCOUNTER — Encounter (HOSPITAL_COMMUNITY): Payer: Self-pay | Admitting: Cardiovascular Disease

## 2023-11-18 DIAGNOSIS — I252 Old myocardial infarction: Secondary | ICD-10-CM | POA: Diagnosis not present

## 2023-11-18 DIAGNOSIS — I200 Unstable angina: Secondary | ICD-10-CM

## 2023-11-18 DIAGNOSIS — Z79899 Other long term (current) drug therapy: Secondary | ICD-10-CM | POA: Diagnosis not present

## 2023-11-18 DIAGNOSIS — I25110 Atherosclerotic heart disease of native coronary artery with unstable angina pectoris: Secondary | ICD-10-CM | POA: Diagnosis not present

## 2023-11-18 DIAGNOSIS — Z955 Presence of coronary angioplasty implant and graft: Secondary | ICD-10-CM | POA: Diagnosis not present

## 2023-11-18 DIAGNOSIS — Z8249 Family history of ischemic heart disease and other diseases of the circulatory system: Secondary | ICD-10-CM | POA: Diagnosis not present

## 2023-11-18 DIAGNOSIS — E785 Hyperlipidemia, unspecified: Secondary | ICD-10-CM | POA: Diagnosis not present

## 2023-11-18 DIAGNOSIS — Z87891 Personal history of nicotine dependence: Secondary | ICD-10-CM | POA: Diagnosis not present

## 2023-11-18 DIAGNOSIS — Z888 Allergy status to other drugs, medicaments and biological substances status: Secondary | ICD-10-CM | POA: Diagnosis not present

## 2023-11-18 DIAGNOSIS — Z7982 Long term (current) use of aspirin: Secondary | ICD-10-CM | POA: Diagnosis not present

## 2023-11-18 DIAGNOSIS — Z7902 Long term (current) use of antithrombotics/antiplatelets: Secondary | ICD-10-CM | POA: Diagnosis not present

## 2023-11-18 LAB — BASIC METABOLIC PANEL WITH GFR
Anion gap: 10 (ref 5–15)
BUN: 8 mg/dL (ref 8–23)
CO2: 22 mmol/L (ref 22–32)
Calcium: 9 mg/dL (ref 8.9–10.3)
Chloride: 107 mmol/L (ref 98–111)
Creatinine, Ser: 0.75 mg/dL (ref 0.44–1.00)
GFR, Estimated: >60 mL/min (ref >60)
Glucose, Bld: 90 mg/dL (ref 70–99)
Potassium: 3.6 mmol/L (ref 3.5–5.1)
Sodium: 139 mmol/L (ref 135–145)

## 2023-11-18 LAB — CBC
HCT: 43.7 % (ref 36.0–46.0)
Hemoglobin: 15.1 g/dL — ABNORMAL HIGH (ref 12.0–15.0)
MCH: 30.1 pg (ref 26.0–34.0)
MCHC: 34.6 g/dL (ref 30.0–36.0)
MCV: 87.2 fL (ref 80.0–100.0)
Platelets: 166 K/uL (ref 150–400)
RBC: 5.01 MIL/uL (ref 3.87–5.11)
RDW: 13.2 % (ref 11.5–15.5)
WBC: 8.3 K/uL (ref 4.0–10.5)
nRBC: 0 % (ref 0.0–0.2)

## 2023-11-18 NOTE — Progress Notes (Signed)
   Cardiologist:  Riley  Subjective:  Denies SSCP, palpitations or Dyspnea Ready for d/c  Objective:  Vitals:   11/17/23 2117 11/18/23 0045 11/18/23 0446 11/18/23 0848  BP: (!) 151/84 131/80 (!) 141/52 126/77  Pulse: 69 65 79 61  Resp: 16 17 18 16   Temp: 98.2 F (36.8 C) 97.8 F (36.6 C) 99 F (37.2 C) 98.5 F (36.9 C)  TempSrc: Oral Oral Oral Oral  SpO2:   94% 93%  Weight:      Height:        Intake/Output from previous day:  Intake/Output Summary (Last 24 hours) at 11/18/2023 0959 Last data filed at 11/18/2023 0700 Gross per 24 hour  Intake 600 ml  Output 750 ml  Net -150 ml    Physical Exam: Affect appropriate Healthy:  appears stated age HEENT: normal Neck supple with no adenopathy JVP normal no bruits no thyromegaly Lungs clear with no wheezing and good diaphragmatic motion Heart:  S1/S2 no murmur, no rub, gallop or click PMI normal Abdomen: benighn, BS positve, no tenderness, no AAA no bruit.  No HSM or HJR Distal pulses intact with no bruits No edema Neuro non-focal Skin warm and dry No muscular weakness Right radial cath site A  Lab Results: Basic Metabolic Panel: Recent Labs    11/18/23 0417  NA 139  K 3.6  CL 107  CO2 22  GLUCOSE 90  BUN 8  CREATININE 0.75  CALCIUM  9.0   Liver Function Tests: No results for input(s): AST, ALT, ALKPHOS, BILITOT, PROT, ALBUMIN in the last 72 hours. No results for input(s): LIPASE, AMYLASE in the last 72 hours. CBC: Recent Labs    11/18/23 0417  WBC 8.3  HGB 15.1*  HCT 43.7  MCV 87.2  PLT 166     Imaging: CARDIAC CATHETERIZATION Result Date: 11/17/2023   Prox RCA to Mid RCA lesion is 10% stenosed.   Prox RCA lesion is 90% stenosed.   Dist RCA lesion is 40% stenosed.   A drug-eluting stent was successfully placed using a STENT SYNERGY XD 3.50X20.   Post intervention, there is a 0% residual stenosis.   Post intervention, there is a 0% residual stenosis. No obstructive disease in  the LAD or Circumflex Large dominant RCA with severe proximal stenosis. Patent mid vessel stent without significant restenosis. Mild to moderate distal RCA stenosis Successful PTCA/DES x 1 proximal to mid RCA Continue DAPT with ASA and Brilinta  for at least six months.    Cardiac Studies:  ECG: SR inferior lateral ST changes    Telemetry:  NSR  Echo: 10/24/23 EF 60-65%   Medications:    aspirin  EC  81 mg Oral Daily   free water   500 mL Oral Once   isosorbide  mononitrate  30 mg Oral Daily   levETIRAcetam   750 mg Oral BID   metoprolol  succinate  100 mg Oral Daily   sodium chloride  flush  3 mL Intravenous Q12H   ticagrelor   90 mg Oral Once   ticagrelor   90 mg Oral BID      sodium chloride       Assessment/Plan:   Angina:  had PCI/stenting of the proximal RCA yesterday. No obstructive left side disease EF normal DAT for a year Cardiac rehab has already been in Continue lopressor  and DAT   Outpateint f/u CM  Maude Emmer 11/18/2023, 9:59 AM

## 2023-11-18 NOTE — Plan of Care (Signed)

## 2023-11-18 NOTE — Progress Notes (Addendum)
 CARDIAC REHAB PHASE I   Pt stated she already walked. Ed given to pt and family. Discussed restrictions, NTG use, ASA and Brilinta , stent, site care, heart healthy diet, and exercise guidelines. Pt left in bed w/ call bell in reach. Will refer to [facility] [place]. Will continue to follow.  [id number] Johnnie JINNY Moats, MS, ACSM-CEP 11/18/2023 9:38 AM

## 2023-11-18 NOTE — Plan of Care (Signed)

## 2023-11-18 NOTE — Discharge Summary (Signed)
 Discharge Summary   Patient ID: [name] MRN: [id number]; DOB: 1960/07/12  Admit date: 11/17/2023 Discharge date: 11/18/2023  PCP:  Ransom Other, MD   [facility] Providers Cardiologist:  Lonni Cash, MD  Cardiology APP:  Lelon Glendia DASEN, PA-C    Discharge Diagnoses  Principal Problem:   Unstable angina [facility])   Diagnostic Studies/Procedures   Left heart catheterization 11/17/23    Prox RCA to Mid RCA lesion is 10% stenosed.   Prox RCA lesion is 90% stenosed.   Dist RCA lesion is 40% stenosed.   A drug-eluting stent was successfully placed using a STENT SYNERGY XD 3.50X20.   Post intervention, there is a 0% residual stenosis.   Post intervention, there is a 0% residual stenosis.   No obstructive disease in the LAD or Circumflex Large dominant RCA with severe proximal stenosis. Patent mid vessel stent without significant restenosis.  Mild to moderate distal RCA stenosis Successful PTCA/DES x 1 proximal to mid RCA   Continue DAPT with ASA and Brilinta  for at least six months.   Diagnostic Dominance: Right  Intervention   _____________   History of Present Illness   Wendy Lopez is a 63 y.o. female with a past medical history of CAD, hyperlipidemia with intolerance to statins, Zetia, PCSK9, family history of tobacco use. Followed by Dr. Carlon.   She has a history of coronary artery disease with a prior right coronary artery infarct and T wave abnormalities. Myocardial perfusion imaging in the past showed evidence of an infarct, as the patient recalls being told by her doctors. She experiences chest pressure, particularly during exercise, and reports that her symptoms seemed to improve after she stopped taking certain medications.   She has a complex medication history with intolerance to multiple medications including statins, Zetia, and PCSK9 inhibitors. She cannot take ranolazine due to its interaction with ticagrelor . She reports an  allergy to a synthetic binder found in many generic and some brand name medications, necessitating the use of EpiPens. She is currently unable to find a specific manufacturer of isosorbide  mononitrate (NFG INGENUS) that she can tolerate due to a potential shortage.   She describes a significant episode prior to a previous Sunday where she felt as though 'somebody was ripping my esophagus out,' which took her breath away but was not accompanied by pain.   She has a family history of heart disease, with many relatives having had heart attacks, and she has performed CPR on a cousin before. Despite her family history, she did not have a heart attack until later in life.   She has a history of smoking, which she describes as her 'favorite' habit, and acknowledges the potential impact on her health. She has undergone multiple stress tests and heart catheterizations in the past, and she reports feeling poorly after stress tests, with symptoms of pressure during exercise.   Patient had been seen by Glendia Lelon, PA-C on 6/30.  Patient reported recurring chest discomfort.  Underwent echo 10/24/2023 that showed EF 60-65%, no regional wall motion abnormalities, normal RV systolic function, mild MR.  Underwent cardiac PET on 11/07/2023, findings consistent with inferior and inferolateral infarction with inferolateral peri-infarct ischemia.  Based on these findings, she was set up for an outpatient cardiac catheterization   Hospital Course    Cardiac PET on 11/07/2023 showed findings consistent with infarction and inferolateral peri-infarct ischemia.  She was set up for an outpatient cardiac catheterization with Dr. Cash.  Presented for scheduled procedure on 8/1.  Cardiac catheterization showed 10% stenosis in proximal-mid RCA, 90% stenosis in proximal RCA, 40% stenosis in distal RCA.  There was no obstructive disease in the LAD or circumflex.  Underwent successful PTCA/DES x 1 to proximal-mid RCA.  Recommended  continuing DAPT with aspirin , Brilinta  for at least 6 months.  Following the procedure, patient was admitted overnight for observation.  Seen by Dr. Delford on 8/2 and was deemed stable for discharge. She was seen by cardiac rehab. She had already walked in the halls and tolerated it well. They discussed restrictions, NTG use, ASA and Brilinta , stent, site care, heart healthy diet, and exercise guidelines. Referred to cardiac rehab.   CAD  - Now s/p DES to the prox-mid RCA  - Echo 7/8 showed EF 60-65%, no regional wall motion abnormalities, normal RV systolic function  - Continue DAPT with ASA, Brilinta  for at least 6 months  - Continue metoprolol  succinate 100 mg daily  - Continue imdur  30 mg daily   Medication allergies and intolerance  HLD  - Allergies to multiple medications, including statins, Zetia, PCSK9 inhibitors, and certain binders in generic drugs.  - Intolerance to ranolazine due to ticagrelor  interaction.  - Difficulty obtaining isosorbide  mononitrate from a specific manufacturer due to allergies to synthetic binders. Ingenus brand identified as tolerable.  - Should f/u with her allergist  - Patient has been referred to the PharmD lipid clinic  Tobacco use - Patient has been counseled on tobacco cessation       Did the patient have an acute coronary syndrome (MI, NSTEMI, STEMI, etc) this admission?:  No                               Did the patient have a percutaneous coronary intervention (stent / angioplasty)?:  Yes.     Cath/PCI Registry Performance & Quality Measures: Aspirin  prescribed? - Yes ADP Receptor Inhibitor (Plavix/Clopidogrel, Brilinta /Ticagrelor  or Effient/Prasugrel) prescribed (includes medically managed patients)? - Yes High Intensity Statin (Lipitor  40-80mg  or Crestor  20-40mg ) prescribed? - No - Patient has many drug allergies, has been referred to pharmD lipid clinic  For EF <40%, was ACEI/ARB prescribed? - Not Applicable (EF >/= 40%) For EF <40%,  Aldosterone Antagonist (Spironolactone or Eplerenone) prescribed? - Not Applicable (EF >/= 40%) Cardiac Rehab Phase II ordered? - Yes    _____________  Discharge Vitals Blood pressure 126/77, pulse 61, temperature 98.5 F (36.9 C), temperature source Oral, resp. rate 16, height 5' 2 (1.575 m), weight 68 kg, last menstrual period 11/03/1993, SpO2 93%.  Filed Weights   11/17/23 1014  Weight: 68 kg    Labs & Radiologic Studies  CBC Recent Labs    11/18/23 0417  WBC 8.3  HGB 15.1*  HCT 43.7  MCV 87.2  PLT 166   Basic Metabolic Panel Recent Labs    91/97/74 0417  NA 139  K 3.6  CL 107  CO2 22  GLUCOSE 90  BUN 8  CREATININE 0.75  CALCIUM  9.0   Liver Function Tests No results for input(s): AST, ALT, ALKPHOS, BILITOT, PROT, ALBUMIN in the last 72 hours. No results for input(s): LIPASE, AMYLASE in the last 72 hours. High Sensitivity Troponin:   No results for input(s): TROPONINIHS in the last 720 hours.  No results for input(s): TRNPT in the last 720 hours.  BNP Invalid input(s): POCBNP No results for input(s): PROBNP in the last 72 hours.  No results for input(s): BNP in the last  72 hours.  D-Dimer No results for input(s): DDIMER in the last 72 hours. Hemoglobin A1C No results for input(s): HGBA1C in the last 72 hours. Fasting Lipid Panel No results for input(s): CHOL, HDL, LDLCALC, TRIG, CHOLHDL, LDLDIRECT in the last 72 hours. Lipoprotein (a)  Date/Time Value Ref Range Status  06/14/2022 08:24 AM 121.4 (H) <75.0 nmol/L Final    Comment:    (NOTE) Note:  Values greater than or equal to 75.0 nmol/L may       indicate an independent risk factor for CHD,       but must be evaluated with caution when applied       to non-Caucasian populations due to the       influence of genetic factors on Lp(a) across       ethnicities. Performed At: [facility] [address] [place], [place] [id number] Jennette Shorter MD  Ey:[id number]     Thyroid  Function Tests No results for input(s): TSH, T4TOTAL, T3FREE, THYROIDAB in the last 72 hours.  Invalid input(s): FREET3 _____________  CARDIAC CATHETERIZATION Result Date: 11/17/2023   Prox RCA to Mid RCA lesion is 10% stenosed.   Prox RCA lesion is 90% stenosed.   Dist RCA lesion is 40% stenosed.   A drug-eluting stent was successfully placed using a STENT SYNERGY XD 3.50X20.   Post intervention, there is a 0% residual stenosis.   Post intervention, there is a 0% residual stenosis. No obstructive disease in the LAD or Circumflex Large dominant RCA with severe proximal stenosis. Patent mid vessel stent without significant restenosis. Mild to moderate distal RCA stenosis Successful PTCA/DES x 1 proximal to mid RCA Continue DAPT with ASA and Brilinta  for at least six months.   NM PET CT CARDIAC PERFUSION MULTI W/ABSOLUTE BLOODFLOW Result Date: 11/07/2023   Findings are consistent with inferior and inferolateral infarction with inferolateral peri-infarct ischemia with abnormal associated regional stress flows. The study is high risk due to the amount of myocardium affected.   LV perfusion is abnormal. There is evidence of ischemia. There is evidence of infarction. Defect 1: There is a large defect with severe reduction in uptake present in the apical to basal inferior and inferolateral location(s) that is partially reversible. There is abnormal wall motion in the defect area. Consistent with infarction and peri-infarct ischemia.   Rest left ventricular function is normal. Rest EF: 60%. Stress left ventricular function is normal. Stress EF: 62%. End diastolic cavity size is normal. End systolic cavity size is normal.   Myocardial blood flow was computed to be 0.16ml/g/min at rest and 1.63ml/g/min at stress. Global myocardial blood flow reserve was 2.43 and was normal.   Coronary calcium  assessment not performed due to prior revascularization. Aortic atherosclerosis.    Electronically Signed  By: Stanly Leavens M.D. EXAM: OVER-READ INTERPRETATION CARDIAC CT CHEST The following report is an over-read performed by radiologist Dr. Ryan Salvage of [facility] on 11/07/2023. This over-read does not include interpretation of cardiac or coronary anatomy or pathology. The cardiac and PET interpretation by the cardiologist is attached or will be attached. COMPARISON:  Chest CT 02/02/2023 FINDINGS: Extracardiac Vascular: Aortic atherosclerosis. Mediastinum: Unremarkable Lung: Chronic scarring at the right lung base. Included Upper Abdomen: Unremarkable Musculoskeletal: Right mastectomy. IMPRESSION: 1. Chronic scarring at the right lung base. 2. Right mastectomy. 3.  Aortic Atherosclerosis (ICD10-I70.0). Electronically Signed   By: Ryan Salvage M.D.   On: 11/07/2023 12:40  ECHOCARDIOGRAM COMPLETE Result Date: 10/24/2023    ECHOCARDIOGRAM REPORT   Patient  Name:   Wendy Lopez Date of Exam: 10/24/2023 Medical Rec #:  [id number]         Height:       62.0 in Accession #:    [id number]        Weight:       151.0 lb Date of Birth:  July 17, 1960          BSA:          1.696 m Patient Age:    62 years          BP:           110/80 mmHg Patient Gender: F                 HR:           62 bpm. Exam Location:  [address] Procedure: 2D Echo, 3D Echo, Cardiac Doppler and Color Doppler (Both Spectral            and Color Flow Doppler were utilized during procedure). Indications:    I25.10 CAD  History:        Patient has prior history of Echocardiogram examinations, most                 recent 10/24/2022. CAD and Previous Myocardial Infarction, Mitral                 Valve Prolapse, Signs/Symptoms:Chest Pain and Shortness of                 Breath; Risk Factors:Family History of Coronary Artery Disease,                 Hypertension, Dyslipidemia and Current Smoker. Right Breast                 Cancer- status post Mastectomy and Chemotherapy, Palpitations.  Sonographer:     Heather Hawks RDCS Referring Phys: GLENDIA DASEN WEAVER IMPRESSIONS  1. Left ventricular ejection fraction, by estimation, is 60 to 65%. Left ventricular ejection fraction by 3D volume is 65 %. The left ventricle has normal function. The left ventricle has no regional wall motion abnormalities. Left ventricular diastolic  parameters were normal.  2. Right ventricular systolic function is normal. The right ventricular size is normal. Mildly increased right ventricular wall thickness. There is normal pulmonary artery systolic pressure. The estimated right ventricular systolic pressure is 31.1 mmHg.  3. The mitral valve is normal in structure. Mild mitral valve regurgitation. No evidence of mitral stenosis.  4. The aortic valve is tricuspid. Aortic valve regurgitation is not visualized. No aortic stenosis is present.  5. The inferior vena cava is normal in size with greater than 50% respiratory variability, suggesting right atrial pressure of 3 mmHg. FINDINGS  Left Ventricle: Left ventricular ejection fraction, by estimation, is 60 to 65%. Left ventricular ejection fraction by 3D volume is 65 %. The left ventricle has normal function. The left ventricle has no regional wall motion abnormalities. The left ventricular internal cavity size was normal in size. There is no left ventricular hypertrophy. Left ventricular diastolic parameters were normal. Right Ventricle: The right ventricular size is normal. Mildly increased right ventricular wall thickness. Right ventricular systolic function is normal. There is normal pulmonary artery systolic pressure. The tricuspid regurgitant velocity is 2.65 m/s, and with an assumed right atrial pressure of 3 mmHg, the estimated right ventricular systolic pressure is 31.1 mmHg. Left Atrium: Left atrial size was normal in size. Right Atrium: Right atrial size was  normal in size. Pericardium: There is no evidence of pericardial effusion. Mitral Valve: The mitral valve is normal in structure.  Mild mitral valve regurgitation. No evidence of mitral valve stenosis. Tricuspid Valve: The tricuspid valve is normal in structure. Tricuspid valve regurgitation is mild . No evidence of tricuspid stenosis. Aortic Valve: The aortic valve is tricuspid. Aortic valve regurgitation is not visualized. No aortic stenosis is present. Pulmonic Valve: The pulmonic valve was normal in structure. Pulmonic valve regurgitation is not visualized. No evidence of pulmonic stenosis. Aorta: The aortic root and ascending aorta are structurally normal, with no evidence of dilitation. Venous: The inferior vena cava is normal in size with greater than 50% respiratory variability, suggesting right atrial pressure of 3 mmHg. IAS/Shunts: No atrial level shunt detected by color flow Doppler. Additional Comments: 3D was performed not requiring image post processing on an independent workstation and was normal.  LEFT VENTRICLE PLAX 2D LVIDd:         4.80 cm         Diastology LVIDs:         3.10 cm         LV e' medial:    6.85 cm/s LV PW:         0.60 cm         LV E/e' medial:  10.0 LV IVS:        1.00 cm         LV e' lateral:   11.40 cm/s LVOT diam:     1.80 cm         LV E/e' lateral: 6.0 LV SV:         57 LV SV Index:   34 LVOT Area:     2.54 cm        3D Volume EF                                LV 3D EF:    Left                                             ventricul                                             ar                                             ejection                                             fraction                                             by 3D  volume is                                             65 %.                                 3D Volume EF:                                3D EF:        65 %                                LV EDV:       80 ml                                LV ESV:       28 ml                                LV SV:        52 ml RIGHT VENTRICLE RV Basal  diam:  2.70 cm RV S prime:     9.46 cm/s TAPSE (M-mode): 1.8 cm RVSP:           31.1 mmHg LEFT ATRIUM             Index        RIGHT ATRIUM           Index LA diam:        3.30 cm 1.95 cm/m   RA Pressure: 3.00 mmHg LA Vol (A2C):   30.1 ml 17.74 ml/m  RA Area:     11.70 cm LA Vol (A4C):   47.5 ml 28.00 ml/m  RA Volume:   23.50 ml  13.85 ml/m LA Biplane Vol: 41.9 ml 24.70 ml/m  AORTIC VALVE LVOT Vmax:   95.90 cm/s LVOT Vmean:  62.150 cm/s LVOT VTI:    0.224 m  AORTA Ao Root diam: 2.70 cm Ao Asc diam:  3.00 cm MITRAL VALVE               TRICUSPID VALVE MV Area (PHT): cm         TR Peak grad:   28.1 mmHg MV Decel Time: 353 msec    TR Vmax:        265.00 cm/s MV E velocity: 68.23 cm/s  Estimated RAP:  3.00 mmHg MV A velocity: 74.83 cm/s  RVSP:           31.1 mmHg MV E/A ratio:  0.91                            SHUNTS                            Systemic VTI:  0.22 m                            Systemic Diam: 1.80 cm Sunit Tolia  Electronically signed by Madonna Large Signature Date/Time: 10/24/2023/7:48:28 PM    Final     Disposition Pt is being discharged home today in good condition.  Follow-up Plans & Appointments  Discharge Instructions     Amb Referral to Cardiac Rehabilitation   Complete by: As directed    Diagnosis:  Coronary Stents PTCA     After initial evaluation and assessments completed: Virtual Based Care may be provided alone or in conjunction with Phase 2 Cardiac Rehab based on patient barriers.: Yes   Intensive Cardiac Rehabilitation (ICR) [facility] location only OR Traditional Cardiac Rehabilitation (TCR) *If criteria for ICR are not met will enroll in TCR [facility] only): Yes   Diet - low sodium heart healthy   Complete by: As directed    Discharge instructions   Complete by: As directed    Radial Site Care Refer to this sheet in the next few weeks. These instructions provide you with information on caring for yourself after your procedure. Your caregiver may also give you more specific  instructions. Your treatment has been planned according to current medical practices, but problems sometimes occur. Call your caregiver if you have any problems or questions after your procedure. HOME CARE INSTRUCTIONS You may shower the day after the procedure. Remove the bandage (dressing) and gently wash the site with plain soap and water . Gently pat the site dry.  Do not apply powder or lotion to the site.  Do not submerge the affected site in water  for 3 to 5 days.  Inspect the site at least twice daily.  Do not flex or bend the affected arm for 24 hours.  No lifting over 5 pounds (2.3 kg) for 5 days after your procedure.  Do not drive home if you are discharged the same day of the procedure. Have someone else drive you.  You may drive 24 hours after the procedure unless otherwise instructed by your caregiver.  What to expect: Any bruising will usually fade within 1 to 2 weeks.  Blood that collects in the tissue (hematoma) may be painful to the touch. It should usually decrease in size and tenderness within 1 to 2 weeks.  SEEK IMMEDIATE MEDICAL CARE IF: You have unusual pain at the radial site.  You have redness, warmth, swelling, or pain at the radial site.  You have drainage (other than a small amount of blood on the dressing).  You have chills.  You have a fever or persistent symptoms for more than 72 hours.  You have a fever and your symptoms suddenly get worse.  Your arm becomes pale, cool, tingly, or numb.  You have heavy bleeding from the site. Hold pressure on the site.   Increase activity slowly   Complete by: As directed        Discharge Medications Allergies as of 11/18/2023       Reactions   Carbamazepine Anaphylaxis   Isosorbide  Mononitrate [isosorbide  Nitrate] Swelling, Rash   Allergy to Torrent brand only   Lamictal  [lamotrigine ] Anaphylaxis   SOB, Lamotrigine  only   Omeprazole Anaphylaxis   Other    Decongestants cause Epileptic Seizures    Pantoprazole  Anaphylaxis   Praluent  [alirocumab ] Nausea And Vomiting   Prednisone Other (See Comments), Rash, Shortness Of Breath   Prescribing MD told patient he thinks dose (dose pack) was just too high., 03/09/11   STATES HAS TAKEN SMALLER DOSE AND STILL HAD RASH AND JITTERINESS, Prescribing MD told patient he thinks dose (dose pack) was just too high., 03/09/11  STATES HAS TAKEN SMALLER DOSE AND STILL HAD RASH AND JITTERINESS   Proton Pump Inhibitors Anaphylaxis   Pseudoephedrine Hcl Er Other (See Comments)   SEIZURES.   Valium Other (See Comments)   Hallucinations, spasticity, hyper-relfexive   Amlodipine  Rash   Face swell   Oxycodone Nausea And Vomiting, Rash, Other (See Comments)   Ibuprofen Swelling   Percocet [oxycodone-acetaminophen ] Nausea And Vomiting   Rosuvastatin  Diarrhea   Statins    myalgia   Trokendi  Xr [topiramate  Er] Other (See Comments)   Made headache worse, dizziness, difficulty breathing, rash   Betadine [povidone Iodine] Rash   Latex Dermatitis, Rash   Shellfish Allergy Swelling        Medication List     TAKE these medications    aspirin  EC 81 MG tablet Take 81 mg by mouth daily. Swallow whole.   Brilinta  90 MG Tabs tablet Generic drug: ticagrelor  TAKE 1 TABLET BY MOUTH TWICE DAILY   cyanocobalamin  1000 MCG/ML injection Commonly known as: VITAMIN B12 Inject 1,000 mcg as directed every 30 (thirty) days.   EPINEPHrine 0.3 mg/0.3 mL Soaj injection Commonly known as: EPI-PEN Inject 0.3 mg into the muscle as needed for anaphylaxis.   isosorbide  mononitrate 30 MG 24 hr tablet Commonly known as: IMDUR  Take 1 tablet (30 mg total) by mouth daily.   levETIRAcetam  750 MG tablet Commonly known as: KEPPRA  Take 1 tablet (750 mg total) by mouth 2 (two) times daily.   metoprolol  succinate 100 MG 24 hr tablet Commonly known as: TOPROL -XL TAKE 1 TABLET(100 MG) BY MOUTH DAILY WITH OR IMMEDIATELY FOLLOWING A MEAL   Misc. Devices Misc Please supply with  Prosthetic Mastectomy Bras   nitroGLYCERIN  0.4 MG SL tablet Commonly known as: Nitrostat  Place 1 tablet (0.4 mg total) under the tongue every 5 (five) minutes as needed.   sodium chloride  0.65 % nasal spray Commonly known as: OCEAN Place 1-2 sprays into the nose as needed for congestion.   ZyrTEC  Allergy 10 MG tablet Generic drug: cetirizine  Take 10 mg by mouth at bedtime as needed for allergies.         Outstanding Labs/Studies   Duration of Discharge Encounter: APP Time: 20 minutes   Signed, Rollo FABIENE Louder, PA-C 11/18/2023, 10:02 AM

## 2023-11-21 ENCOUNTER — Other Ambulatory Visit: Payer: Self-pay

## 2023-11-21 MED ORDER — TICAGRELOR 90 MG PO TABS: 90.0000 mg | ORAL_TABLET | Freq: Two times a day (BID) | ORAL | 3 refills | Status: AC

## 2023-11-23 ENCOUNTER — Telehealth (HOSPITAL_COMMUNITY): Payer: Self-pay

## 2023-11-23 NOTE — Telephone Encounter (Signed)
 Attempted to call patient in regards to Cardiac Rehab - LM on VM

## 2023-11-23 NOTE — Telephone Encounter (Signed)
 Pt insurance is active and benefits verified through Medicare a/b Co-pay 0, DED $257/$257 met, out of pocket 0/0 met, co-insurance 20%. no pre-authorization required. Passport, 11/23/2023@1 :32, REF# [id number]   TCR/ICR? ICR Visit(date of service)limitation? 36 Can multiple codes be used on the same date of service/visit?(IF ITS A LIMIT) yes    Is this a lifetime maximum or an annual maximum? annual Has the member used any of these services to date? no Is there a time limit (weeks/months) on start of program and/or program completion? no     Will contact patient to see if she is interested in the Cardiac Rehab Program. If interested, patient will need to complete follow up appt. Once completed, patient will be contacted for scheduling upon review by the RN Navigator.

## 2023-12-08 NOTE — Progress Notes (Signed)
 Erroneous encounter

## 2023-12-24 DIAGNOSIS — I2510 Atherosclerotic heart disease of native coronary artery without angina pectoris: Secondary | ICD-10-CM | POA: Insufficient documentation

## 2023-12-24 NOTE — Assessment & Plan Note (Deleted)
 Status post inferior STEMI in July 2024 treated with a DES to the mid RCA. She had recurrent chest pain and follow-up cardiac catheterization 10/26/2022 which demonstrated patent RCA stent. She recently underwent PET MPI to work up chest pain. This was high risk and cardiac catheterization demonstrated patent RCA stent and high grade proximal RCA stenosis tx with DES. ***

## 2023-12-24 NOTE — Progress Notes (Deleted)
 {This patient may be at risk for Amyloid. She has one or more dx on the prob list or PMH from the following list -  Abnormal EKG, HFpEF/Diastolic CHF, Aortic Stenosis, LVH, Bilateral Carpal Tunnel Syndrome, Biceps Tendon Rupture, Spinal Stenosis, Pericardial Effusion, Left Atrial Enlargement, Conduction System Disorder. See list below or review PMH.  Diagnoses From Problem List           Noted     Abnormal ECG 03/31/2014    Click HERE to open Cardiac Amyloid Screening SmartSet to order screening OR Click HERE to defer testing for 1 year or permanently :1}     OFFICE NOTE:    Date:  12/24/2023  ID:  Wendy Lopez, DOB 10-12-1960, MRN [id number] PCP: Ransom Other, MD  [facility] Providers Cardiologist:  Lonni Cash, MD Cardiology APP:  Lelon Glendia DASEN, PA-C { Click to update primary MD,subspecialty MD or APP then REFRESH:1}      *** Patient Profile:  Coronary artery disease  Pharmacologic MPI 02/23/2018: Low risk CAC score 06/14/2022: 1 (59th percentile) Inf STEMI in 10/2022 s/p 3.5 x 20 mm DES to the mid RCA LHC 10/23/2022: Mid RCA 99, EF 45-50 TTE 10/24/2022: EF 50, inferoseptal and inferior AK, GR 1 DD, normal RVSF, trivial MR Recurrent chest pain >>> LHC 10/26/22: RCA stent patent TTE 10/24/23: EF 60-65, no RWMA, NL RVSF, NL PASP, mild MR PET MPI 11/07/23: inf and inf-lat infarct w inf-lat peri-infarct ischemia, EF 60, MBFR 2.43 (normal); high risk  S/p 3.5 x 20 mm DES to pRCA in 11/2023 LHC 11/17/23: mRCA stent patent w 10 ISR, pRCA 90 (PCI), dRCA 40 SVT Monitor 02/2020: 21 brief episodes of SVT-longest 15 beats Hypertension  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        Discussed the use of AI scribe software for clinical  note transcription with the patient, who gave verbal consent to proceed. History of Present Illness Wendy Lopez is a 63 y.o. female who returns for post hospital follow up. She was last seen in 09/2023 with symptoms of chest pain. TTE was obtained and showed normal EF. PET MPI was also obtained. This was high risk with inferior and inferolateral scar with peri-infarct ischemia. Cardiac catheterization was arranged 11/17/23 and demonstrated patent stent in the mRCA and high grade proximal stenosis in the RCA treated with a DES. Recommendation was to continue DAPT for 6 mos.     ROS-See HPI***    Studies Reviewed:      *** Results  Risk Assessment/Calculations: {Does this patient have ATRIAL FIBRILLATION?[phone]} No BP recorded.  {Refresh Note OR Click here to enter BP  :1}***      Physical Exam:  VS:  LMP 11/03/1993        Wt Readings from Last 3 Encounters:  11/17/23 150 lb (68 kg)  11/13/23 151 lb (68.5 kg)  10/16/23 151 lb (68.5 kg)    Physical Exam***     Assessment and Plan:    Assessment & Plan Coronary artery disease involving native coronary artery of native heart with angina pectoris (HCC) Status post inferior STEMI in July 2024 treated with a DES to the mid RCA. She had recurrent chest pain and follow-up cardiac catheterization 10/26/2022 which demonstrated patent RCA stent. She recently underwent  PET MPI to work up chest pain. This was high risk and cardiac catheterization demonstrated patent RCA stent and high grade proximal RCA stenosis tx with DES. *** Primary hypertension  Hyperlipidemia LDL goal <70 She is intolerant to statins, ezetimibe and PCSK9 inhibitor. Goal LDL is at least less than 70, ideally less than 55. *** Assessment and Plan Assessment & Plan    {      :1}   {The patient has an active order for outpatient cardiac rehabilitation.   Please indicate if the patient is ready to start. Do NOT delete this.  It will auto delete.  Refresh note,  then sign.              Click here to document readiness and see contraindications.  :1}  Cardiac Rehabilitation Eligibility Assessment    {Are you ordering a CV Procedure (e.g. stress test, cath, DCCV, TEE, etc)?   Press F2        :[id number]}  Dispo:  No follow-ups on file.  Signed, Glendia Ferrier, PA-C

## 2023-12-25 ENCOUNTER — Ambulatory Visit: Admitting: Physician Assistant

## 2023-12-25 DIAGNOSIS — I10 Essential (primary) hypertension: Secondary | ICD-10-CM

## 2023-12-25 DIAGNOSIS — I25119 Atherosclerotic heart disease of native coronary artery with unspecified angina pectoris: Secondary | ICD-10-CM

## 2023-12-25 DIAGNOSIS — E785 Hyperlipidemia, unspecified: Secondary | ICD-10-CM

## 2023-12-26 ENCOUNTER — Telehealth: Payer: Self-pay | Admitting: *Deleted

## 2023-12-26 NOTE — Telephone Encounter (Signed)
 Call pt regarding message below.  Per Glendia Ferrier, pt's appointment was rescheduled for November, however, recently had a stent placed and needs to be seen sooner.  Left pt a message to call back.SABRA

## 2023-12-26 NOTE — Telephone Encounter (Signed)
 Pt called back and she has been scheduled to see Glendia Ferrier, 01/03/24.

## 2023-12-26 NOTE — Telephone Encounter (Signed)
-----   Message from [place] sent at 12/25/2023 11:37 AM EDT ----- Pt cx appt w me 12/25/2023. They rescheduled her in Nov. She just had PCI. We need to see her before she can start cardiac rehab. Can you get her rescheduled with me in the next week or so? Scott

## 2023-12-29 ENCOUNTER — Ambulatory Visit: Admitting: Nurse Practitioner

## 2024-01-02 NOTE — Progress Notes (Unsigned)
 OFFICE NOTE:    Date:  01/03/2024  ID:  [name], DOB 07/13/1960, MRN [id number] PCP: Ransom Other, MD  [facility] Providers Cardiologist:  Lonni Cash, MD Cardiology APP:  Lelon Glendia DASEN, PA-C       Coronary artery disease  Pharmacologic MPI 02/23/2018: Low risk CAC score 06/14/2022: 1 (59th percentile) Inf STEMI in 10/2022 s/p 3.5 x 20 mm DES to the mid RCA LHC 10/23/2022: Mid RCA 99, EF 45-50 TTE 10/24/2022: EF 50, inferoseptal and inferior AK, GR 1 DD, normal RVSF, trivial MR Recurrent chest pain >>> LHC 10/26/22: RCA stent patent TTE 10/24/23: EF 60-65, no RWMA, NL RVSF, NL PASP, mild MR PET MPI 11/07/23: inf and inf-lat infarct w inf-lat peri-infarct ischemia, EF 60, MBFR 2.43 (normal); high risk  S/p 3.5 x 20 mm DES to pRCA in 11/2023 LHC 11/17/23: mRCA stent patent w 10 ISR, pRCA 90 (PCI), dRCA 40 SVT Monitor 02/2020: 21 brief episodes of SVT-longest 15 beats Hypertension  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        Discussed the use of AI scribe software for clinical note transcription with the patient, who gave verbal consent to proceed. History of Present Illness Wendy Lopez is a 63 y.o. female who returns for post hospital follow up. She is a prior pt of Dr. Burnard. She has been reassigned to Dr. Cash. She was last seen in 09/2023 with symptoms of chest pain. TTE was obtained and showed normal EF. PET MPI was also obtained. This was high risk with inferior and inferolateral scar with peri-infarct ischemia. Cardiac catheterization was arranged 11/17/23 and demonstrated patent stent in the mRCA and high grade proximal stenosis in the RCA treated with a DES. Recommendation was to continue DAPT for 6 mos.    She is here alone. She is frustrated that she developed a recurrent blockage so soon. She experiences significant arthritis pain, which is exacerbated by exercise. She describes persistent neck pain and a possible pulled muscle from working out. She reports that her chest symptoms are mostly gone, though she occasionally experiences shooting pain not clearly linked to exertion or stress. Her isosorbide  dose was increased to 30 mg once daily, which causes headaches and nausea. She would like to reduce it back to 15 mg once daily. She is still smoking.   ROS-See HPI     Studies Reviewed:  EKG Interpretation Date/Time:  Wednesday January 03 2024 14:04:36 EDT Ventricular Rate:  75 PR Interval:  136 QRS Duration:  68 QT Interval:  370 QTC Calculation: 413 R Axis:   38  Text Interpretation: Normal sinus rhythm Inferior infarct (cited on or before 27-Oct-2022) Inferior TW inversions, unchanged compared to prior ECGs When compared with ECG of 18-Nov-2023 04:40, T wave inversion no longer evident in Anterior leads Confirmed by Lelon Glendia [phone]) on 01/03/2024 2:57:10 PM           Physical Exam:  VS:  BP 131/74 (BP Location: Right Arm, Patient Position: Sitting, Cuff Size: Normal)   Pulse 75   Ht 5' 2 (1.575 m)   Wt 151 lb (68.5 kg)   LMP 11/03/1993   SpO2 96%   BMI 27.62 kg/m        Wt Readings from Last 3 Encounters:  01/03/24 151 lb (68.5 kg)  11/17/23 150 lb (68 kg)  11/13/23 151 lb (68.5 kg)    Constitutional:      Appearance: Healthy appearance. Not in distress.  Neck:     Vascular: JVD normal.  Pulmonary:     Breath sounds: Normal breath sounds. No wheezing. No rales.  Cardiovascular:     Normal rate. Regular rhythm.     Murmurs: There is no murmur.     Comments: R FA site w/o bruit; R wrist w/o hematoma Edema:    Peripheral edema absent.  Abdominal:     Palpations: Abdomen is soft.        Assessment and Plan:    Assessment & Plan Coronary artery disease  involving native coronary artery of native heart with angina pectoris [facility]) Status post inferior STEMI in July 2024 treated with a DES to the mid RCA. She had recurrent chest pain and follow-up cardiac catheterization 10/26/2022 which demonstrated patent RCA stent. She recently underwent PET MPI to work up chest pain. This was high risk and cardiac catheterization demonstrated patent RCA stent and high grade proximal RCA stenosis tx with DES. Her angina symptoms have improved and her shortness of breath has resolved. She is very frustrated that she had a reassuring cardiac catheterization in 10/2022 and then a significant stenosis requiring PCI 1 year later. We discussed management of risk factors: quitting smoking, attending cardiac rehab, good diet, exercise, maintaining good blood pressure control and aggressive lipid management. She is unable to tolerate statins, ezetimibe or PCSK9 inhibitors. She has a lot of allergies to medications as well. She would like to reduce Imdur  back to 15 mg due to side effects. -Ok to start cardiac rehabilitation  -Stop smoking -Refer to lipid clinic for management of hyperlipidemia -Continue ASA 81 mg once daily, Brilinta  90 mg twice daily x 6 mos, then ASA alone -Decrease Imdur  to 15 mg once daily.  -Continue Metoprolol  succinate 100 mg once daily, NTG prn  -Follow up 6 mos  Primary hypertension The patient's blood pressure is controlled on her current regimen.  Continue current therapy.   Hyperlipidemia LDL goal <70 She is intolerant to statins, ezetimibe and PCSK9 inhibitor. Goal LDL is at least less than 70, ideally less than 55.  -Refer to Lipid clinic Tobacco abuse We discussed the importance of cessation.        Cardiac Rehabilitation Eligibility Assessment  The patient is ready to start cardiac rehabilitation from a cardiac standpoint.     Dispo:  Return in about 6 months (around 07/02/2024) for Routine Follow Up, w/ Dr. Verlin, or Glendia Ferrier,  PA-C.  Signed, Glendia Ferrier, PA-C

## 2024-01-03 ENCOUNTER — Ambulatory Visit: Attending: Physician Assistant | Admitting: Physician Assistant

## 2024-01-03 ENCOUNTER — Encounter: Payer: Self-pay | Admitting: Physician Assistant

## 2024-01-03 VITALS — BP 131/74 | HR 75 | Ht 62.0 in | Wt 151.0 lb

## 2024-01-03 DIAGNOSIS — Z720 Tobacco use: Secondary | ICD-10-CM | POA: Insufficient documentation

## 2024-01-03 DIAGNOSIS — I25119 Atherosclerotic heart disease of native coronary artery with unspecified angina pectoris: Secondary | ICD-10-CM | POA: Insufficient documentation

## 2024-01-03 DIAGNOSIS — I10 Essential (primary) hypertension: Secondary | ICD-10-CM | POA: Insufficient documentation

## 2024-01-03 DIAGNOSIS — E785 Hyperlipidemia, unspecified: Secondary | ICD-10-CM | POA: Insufficient documentation

## 2024-01-03 MED ORDER — NITROGLYCERIN 0.4 MG SL SUBL: 0.4000 mg | SUBLINGUAL_TABLET | SUBLINGUAL | 2 refills | Status: AC | PRN

## 2024-01-03 MED ORDER — ISOSORBIDE MONONITRATE ER 30 MG PO TB24: 15.0000 mg | ORAL_TABLET | Freq: Every day | ORAL | Status: AC

## 2024-01-03 NOTE — Assessment & Plan Note (Addendum)
 Status post inferior STEMI in July 2024 treated with a DES to the mid RCA. She had recurrent chest pain and follow-up cardiac catheterization 10/26/2022 which demonstrated patent RCA stent. She recently underwent PET MPI to work up chest pain. This was high risk and cardiac catheterization demonstrated patent RCA stent and high grade proximal RCA stenosis tx with DES. Her angina symptoms have improved and her shortness of breath has resolved. She is very frustrated that she had a reassuring cardiac catheterization in 10/2022 and then a significant stenosis requiring PCI 1 year later. We discussed management of risk factors: quitting smoking, attending cardiac rehab, good diet, exercise, maintaining good blood pressure control and aggressive lipid management. She is unable to tolerate statins, ezetimibe or PCSK9 inhibitors. She has a lot of allergies to medications as well. She would like to reduce Imdur  back to 15 mg due to side effects. -Ok to start cardiac rehabilitation  -Stop smoking -Refer to lipid clinic for management of hyperlipidemia -Continue ASA 81 mg once daily, Brilinta  90 mg twice daily x 6 mos, then ASA alone -Decrease Imdur  to 15 mg once daily.  -Continue Metoprolol  succinate 100 mg once daily, NTG prn  -Follow up 6 mos

## 2024-01-03 NOTE — Assessment & Plan Note (Signed)
We discussed the importance of cessation.  

## 2024-01-03 NOTE — Patient Instructions (Signed)
 Medication Instructions:  Your physician has recommended you make the following change in your medication:   REDUCE Imdur  to 30 taking 1/2 tablet daily  *If you need a refill on your cardiac medications before your next appointment, please call your pharmacy*  Lab Work: None ordered  If you have labs (blood work) drawn today and your tests are completely normal, you will receive your results only by: MyChart Message (if you have MyChart) OR A paper copy in the mail If you have any lab test that is abnormal or we need to change your treatment, we will call you to review the results.  Testing/Procedures: None ordered  You have been referred to Lipid Clinic / Dr. Mona   Follow-Up: At [facility], you and your health needs are our priority.  As part of our continuing mission to provide you with exceptional heart care, our providers are all part of one team.  This team includes your primary Cardiologist (physician) and Advanced Practice Providers or APPs (Physician Assistants and Nurse Practitioners) who all work together to provide you with the care you need, when you need it.  Your next appointment:   6 month(s)  Provider:   Lonni Cash, MD    We recommend signing up for the patient portal called MyChart.  Sign up information is provided on this After Visit Summary.  MyChart is used to connect with patients for Virtual Visits (Telemedicine).  Patients are able to view lab/test results, encounter notes, upcoming appointments, etc.  Non-urgent messages can be sent to your provider as well.   To learn more about what you can do with MyChart, go to [email or web address].   Other Instructions

## 2024-01-03 NOTE — Assessment & Plan Note (Signed)
The patient's blood pressure is controlled on her current regimen.  Continue current therapy.   

## 2024-01-03 NOTE — Assessment & Plan Note (Addendum)
 She is intolerant to statins, ezetimibe and PCSK9 inhibitor. Goal LDL is at least less than 70, ideally less than 55.  -Refer to Lipid clinic

## 2024-01-04 ENCOUNTER — Telehealth (HOSPITAL_COMMUNITY): Payer: Self-pay

## 2024-01-04 NOTE — Telephone Encounter (Signed)
 Pt insurance is active and benefits verified through Medicare B. Co-pay $0, DED $257/$257 met, out of pocket $0/$0 met, co-insurance 20%. No pre-authorization required. Passport, 11/23/2023 @ 1:32pm, REF# [id number] ([place]).  TCR/ICR? ICR Visit(date of service)limitation? No Can multiple codes be used on the same date of service/visit?(IF ITS A LIMIT) N/A  Is this a lifetime maximum or an annual maximum? Annual Has the member used any of these services to date? Yes - 16 out of 72 sessions Is there a time limit (weeks/months) on start of program and/or program completion? No

## 2024-01-04 NOTE — Telephone Encounter (Signed)
 Attempted to call patient regarding cardiac rehab- no answer, left message. Sent MyChart message.

## 2024-01-05 ENCOUNTER — Other Ambulatory Visit: Payer: Self-pay

## 2024-01-05 MED ORDER — METOPROLOL SUCCINATE ER 100 MG PO TB24: 100.0000 mg | ORAL_TABLET | Freq: Every day | ORAL | 3 refills | Status: AC

## 2024-01-10 ENCOUNTER — Other Ambulatory Visit: Payer: Self-pay | Admitting: Internal Medicine

## 2024-01-10 DIAGNOSIS — J439 Emphysema, unspecified: Secondary | ICD-10-CM | POA: Diagnosis not present

## 2024-01-10 DIAGNOSIS — G629 Polyneuropathy, unspecified: Secondary | ICD-10-CM | POA: Diagnosis not present

## 2024-01-10 DIAGNOSIS — E538 Deficiency of other specified B group vitamins: Secondary | ICD-10-CM | POA: Diagnosis not present

## 2024-01-10 DIAGNOSIS — Z853 Personal history of malignant neoplasm of breast: Secondary | ICD-10-CM

## 2024-01-10 DIAGNOSIS — R1011 Right upper quadrant pain: Secondary | ICD-10-CM | POA: Diagnosis not present

## 2024-01-10 DIAGNOSIS — R42 Dizziness and giddiness: Secondary | ICD-10-CM | POA: Diagnosis not present

## 2024-01-10 DIAGNOSIS — J449 Chronic obstructive pulmonary disease, unspecified: Secondary | ICD-10-CM

## 2024-01-10 DIAGNOSIS — Z23 Encounter for immunization: Secondary | ICD-10-CM | POA: Diagnosis not present

## 2024-01-10 DIAGNOSIS — Z1231 Encounter for screening mammogram for malignant neoplasm of breast: Secondary | ICD-10-CM

## 2024-01-11 ENCOUNTER — Ambulatory Visit: Payer: Medicare Other | Admitting: Neurology

## 2024-01-15 ENCOUNTER — Ambulatory Visit
Admission: RE | Admit: 2024-01-15 | Discharge: 2024-01-15 | Disposition: A | Source: Ambulatory Visit | Attending: Internal Medicine | Admitting: Internal Medicine

## 2024-01-15 ENCOUNTER — Ambulatory Visit
Admission: RE | Admit: 2024-01-15 | Discharge: 2024-01-15 | Disposition: A | Discharge status: Home / Self Care | Source: Ambulatory Visit | Attending: Internal Medicine | Admitting: Internal Medicine

## 2024-01-15 DIAGNOSIS — J432 Centrilobular emphysema: Secondary | ICD-10-CM | POA: Diagnosis not present

## 2024-01-15 DIAGNOSIS — K8020 Calculus of gallbladder without cholecystitis without obstruction: Secondary | ICD-10-CM | POA: Diagnosis not present

## 2024-01-15 DIAGNOSIS — Z853 Personal history of malignant neoplasm of breast: Secondary | ICD-10-CM

## 2024-01-15 DIAGNOSIS — J449 Chronic obstructive pulmonary disease, unspecified: Secondary | ICD-10-CM

## 2024-01-15 MED ORDER — IOPAMIDOL (ISOVUE-300) INJECTION 61%
100.0000 mL | Freq: Once | INTRAVENOUS | Status: AC | PRN
Start: 2024-01-15 — End: 2024-01-15
  Administered 2024-01-15: 100 mL via INTRAVENOUS

## 2024-01-16 ENCOUNTER — Institutional Professional Consult (permissible substitution) (HOSPITAL_BASED_OUTPATIENT_CLINIC_OR_DEPARTMENT_OTHER): Admitting: Internal Medicine

## 2024-01-17 ENCOUNTER — Ambulatory Visit (HOSPITAL_COMMUNITY)
Admission: RE | Admit: 2024-01-17 | Discharge: 2024-01-17 | Disposition: A | Discharge status: Home / Self Care | Source: Ambulatory Visit | Attending: Internal Medicine | Admitting: Internal Medicine

## 2024-01-17 ENCOUNTER — Encounter (HOSPITAL_COMMUNITY): Payer: Self-pay

## 2024-01-17 DIAGNOSIS — Z1231 Encounter for screening mammogram for malignant neoplasm of breast: Secondary | ICD-10-CM | POA: Insufficient documentation

## 2024-01-18 ENCOUNTER — Telehealth (HOSPITAL_COMMUNITY): Payer: Self-pay

## 2024-01-18 NOTE — Telephone Encounter (Signed)
 Attempted f/u call regarding cardiac rehab- no answer, left message. Sent MyChart message.  Closing referral.

## 2024-01-29 ENCOUNTER — Institutional Professional Consult (permissible substitution) (HOSPITAL_BASED_OUTPATIENT_CLINIC_OR_DEPARTMENT_OTHER): Admitting: Internal Medicine

## 2024-02-01 ENCOUNTER — Telehealth: Payer: Self-pay

## 2024-02-01 DIAGNOSIS — K8050 Calculus of bile duct without cholangitis or cholecystitis without obstruction: Secondary | ICD-10-CM | POA: Diagnosis not present

## 2024-02-01 NOTE — Telephone Encounter (Signed)
   Pre-operative Risk Assessment    Patient Name: Wendy Lopez  DOB: Feb 01, 1961 MRN: [id number]   Date of last office visit: 12/29/23 Date of next office visit: 03/28/24   Request for Surgical Clearance    Procedure:  Laparoscopic cholecystectomy with ICG surgery   Date of Surgery:  Clearance TBD                                 Surgeon:  Richerd Silversmith, MD  Surgeon's Group or Practice Name:  [facility]  Phone number:  [phone] Fax number:  [phone]   Type of Clearance Requested:   - Medical  - Pharmacy:  Hold Aspirin  and Ticagrelor  (Brilinta ) Not indicated    Type of Anesthesia:  General    Additional requests/questions:    [name]   02/01/2024, 2:57 PM

## 2024-02-05 ENCOUNTER — Ambulatory Visit: Admitting: Pharmacist

## 2024-02-06 NOTE — Telephone Encounter (Signed)
 Called requesting office to review notes from Dr. Verlin, as well as if the procedure is of urgent matter. I was transferred to the triage nurse fro Dr. Ann.    LILLETTE Manos, RN. I reviewed the notes from Dr. Verlin and recommendations. Per Manos, RN reading notes from Dr. Ann, pt has some symptoms, though does not seem to be urgent matter. Maddi states she will d/w Dr. Ann once they receive the notes that I will fax over today for Dr. Ann to review.   I did ask if Dr. Ann feels procedure is of urgent matter to please let our office know as Dr. Verlin will need re-assess.

## 2024-02-08 ENCOUNTER — Inpatient Hospital Stay: Payer: Medicare Other

## 2024-02-15 ENCOUNTER — Inpatient Hospital Stay: Payer: Medicare Other | Admitting: Oncology

## 2024-02-16 ENCOUNTER — Ambulatory Visit: Admitting: Physician Assistant

## 2024-02-17 ENCOUNTER — Other Ambulatory Visit: Payer: Self-pay | Admitting: Neurology

## 2024-02-19 ENCOUNTER — Ambulatory Visit: Admitting: Neurology

## 2024-02-19 NOTE — Telephone Encounter (Signed)
 Last seen on 01/11/23 Follow up scheduled on 09/24/24

## 2024-02-20 ENCOUNTER — Ambulatory Visit: Admitting: Physician Assistant

## 2024-03-27 ENCOUNTER — Encounter (HOSPITAL_BASED_OUTPATIENT_CLINIC_OR_DEPARTMENT_OTHER): Payer: Self-pay

## 2024-03-28 ENCOUNTER — Encounter (HOSPITAL_BASED_OUTPATIENT_CLINIC_OR_DEPARTMENT_OTHER): Payer: Self-pay

## 2024-03-28 ENCOUNTER — Institutional Professional Consult (permissible substitution) (HOSPITAL_BASED_OUTPATIENT_CLINIC_OR_DEPARTMENT_OTHER): Admitting: Internal Medicine

## 2024-04-15 ENCOUNTER — Encounter: Payer: Self-pay | Admitting: *Deleted

## 2024-07-03 ENCOUNTER — Ambulatory Visit: Admitting: Cardiology

## 2024-09-24 ENCOUNTER — Ambulatory Visit: Admitting: Neurology
